# Patient Record
Sex: Female | Born: 1940 | Race: White | Hispanic: No | Smoking: Never smoker
Health system: Southern US, Community
[De-identification: ages and names within clinical notes are randomized; demographics above are authoritative.]

## PROBLEM LIST (undated history)

## (undated) DIAGNOSIS — I2510 Atherosclerotic heart disease of native coronary artery without angina pectoris: Secondary | ICD-10-CM

## (undated) DIAGNOSIS — F32A Depression, unspecified: Secondary | ICD-10-CM

## (undated) DIAGNOSIS — C4491 Basal cell carcinoma of skin, unspecified: Secondary | ICD-10-CM

## (undated) DIAGNOSIS — R569 Unspecified convulsions: Secondary | ICD-10-CM

## (undated) DIAGNOSIS — E119 Type 2 diabetes mellitus without complications: Secondary | ICD-10-CM

## (undated) DIAGNOSIS — Z992 Dependence on renal dialysis: Secondary | ICD-10-CM

## (undated) DIAGNOSIS — D696 Thrombocytopenia, unspecified: Secondary | ICD-10-CM

## (undated) DIAGNOSIS — I10 Essential (primary) hypertension: Secondary | ICD-10-CM

## (undated) DIAGNOSIS — N186 End stage renal disease: Secondary | ICD-10-CM

## (undated) DIAGNOSIS — F329 Major depressive disorder, single episode, unspecified: Secondary | ICD-10-CM

## (undated) DIAGNOSIS — G4730 Sleep apnea, unspecified: Secondary | ICD-10-CM

## (undated) DIAGNOSIS — E039 Hypothyroidism, unspecified: Secondary | ICD-10-CM

## (undated) DIAGNOSIS — E079 Disorder of thyroid, unspecified: Secondary | ICD-10-CM

## (undated) DIAGNOSIS — D649 Anemia, unspecified: Secondary | ICD-10-CM

## (undated) DIAGNOSIS — I639 Cerebral infarction, unspecified: Secondary | ICD-10-CM

## (undated) DIAGNOSIS — K7581 Nonalcoholic steatohepatitis (NASH): Secondary | ICD-10-CM

## (undated) DIAGNOSIS — K760 Fatty (change of) liver, not elsewhere classified: Secondary | ICD-10-CM

## (undated) HISTORY — DX: Essential (primary) hypertension: I10

## (undated) HISTORY — DX: Thrombocytopenia, unspecified: D69.6

## (undated) HISTORY — PX: ABDOMINAL HYSTERECTOMY: SHX81

## (undated) HISTORY — DX: Cerebral infarction, unspecified: I63.9

## (undated) HISTORY — PX: MOHS SURGERY: SUR867

## (undated) HISTORY — DX: Nonalcoholic steatohepatitis (NASH): K75.81

## (undated) HISTORY — PX: CHOLECYSTECTOMY OPEN: SUR202

## (undated) HISTORY — PX: KNEE ARTHROSCOPY: SHX127

## (undated) HISTORY — PX: TONSILLECTOMY: SUR1361

## (undated) HISTORY — DX: Disorder of thyroid, unspecified: E07.9

## (undated) HISTORY — PX: CATARACT EXTRACTION W/ INTRAOCULAR LENS  IMPLANT, BILATERAL: SHX1307

## (undated) HISTORY — PX: CORNEAL TRANSPLANT: SHX108

## (undated) HISTORY — PX: SHOULDER OPEN ROTATOR CUFF REPAIR: SHX2407

## (undated) HISTORY — PX: EYE SURGERY: SHX253

---

## 2002-03-20 ENCOUNTER — Ambulatory Visit (HOSPITAL_COMMUNITY): Admission: RE | Admit: 2002-03-20 | Discharge: 2002-03-20 | Payer: Self-pay | Admitting: Internal Medicine

## 2002-03-20 ENCOUNTER — Encounter: Payer: Self-pay | Admitting: Internal Medicine

## 2002-05-26 ENCOUNTER — Inpatient Hospital Stay (HOSPITAL_COMMUNITY): Admission: EM | Admit: 2002-05-26 | Discharge: 2002-05-27 | Payer: Self-pay | Admitting: Gastroenterology

## 2002-05-26 ENCOUNTER — Encounter: Payer: Self-pay | Admitting: Gastroenterology

## 2002-05-26 ENCOUNTER — Encounter: Payer: Self-pay | Admitting: Emergency Medicine

## 2002-08-14 ENCOUNTER — Encounter: Payer: Self-pay | Admitting: Internal Medicine

## 2002-08-14 ENCOUNTER — Ambulatory Visit (HOSPITAL_COMMUNITY): Admission: RE | Admit: 2002-08-14 | Discharge: 2002-08-14 | Payer: Self-pay | Admitting: Internal Medicine

## 2005-03-15 ENCOUNTER — Ambulatory Visit: Payer: Self-pay | Admitting: Internal Medicine

## 2005-04-03 ENCOUNTER — Ambulatory Visit: Payer: Self-pay | Admitting: Internal Medicine

## 2005-04-18 ENCOUNTER — Ambulatory Visit: Payer: Self-pay | Admitting: Internal Medicine

## 2005-07-12 ENCOUNTER — Ambulatory Visit: Payer: Self-pay | Admitting: Internal Medicine

## 2005-07-20 ENCOUNTER — Ambulatory Visit (HOSPITAL_COMMUNITY): Admission: RE | Admit: 2005-07-20 | Discharge: 2005-07-20 | Payer: Self-pay | Admitting: Internal Medicine

## 2015-01-27 ENCOUNTER — Encounter: Payer: Self-pay | Admitting: Internal Medicine

## 2015-02-17 DIAGNOSIS — K219 Gastro-esophageal reflux disease without esophagitis: Secondary | ICD-10-CM | POA: Insufficient documentation

## 2015-02-17 DIAGNOSIS — K7460 Unspecified cirrhosis of liver: Secondary | ICD-10-CM | POA: Insufficient documentation

## 2015-02-17 DIAGNOSIS — J309 Allergic rhinitis, unspecified: Secondary | ICD-10-CM | POA: Insufficient documentation

## 2015-03-25 ENCOUNTER — Ambulatory Visit: Payer: Medicare Other | Admitting: Allergy and Immunology

## 2015-04-26 ENCOUNTER — Other Ambulatory Visit: Payer: Self-pay | Admitting: *Deleted

## 2015-04-26 MED ORDER — MONTELUKAST SODIUM 10 MG PO TABS: 10.0000 mg | ORAL_TABLET | Freq: Every day | ORAL | Status: DC

## 2015-06-23 ENCOUNTER — Other Ambulatory Visit: Payer: Self-pay | Admitting: Allergy and Immunology

## 2016-04-28 DIAGNOSIS — I639 Cerebral infarction, unspecified: Secondary | ICD-10-CM

## 2016-04-28 HISTORY — DX: Cerebral infarction, unspecified: I63.9

## 2016-08-30 ENCOUNTER — Other Ambulatory Visit: Payer: Self-pay | Admitting: Vascular Surgery

## 2016-08-30 DIAGNOSIS — N186 End stage renal disease: Secondary | ICD-10-CM

## 2016-08-30 DIAGNOSIS — Z01810 Encounter for preprocedural cardiovascular examination: Secondary | ICD-10-CM

## 2016-10-09 ENCOUNTER — Encounter: Payer: Self-pay | Admitting: Vascular Surgery

## 2016-10-12 ENCOUNTER — Ambulatory Visit (HOSPITAL_COMMUNITY)
Admission: RE | Admit: 2016-10-12 | Discharge: 2016-10-12 | Disposition: A | Discharge status: Home / Self Care | Payer: Medicare Other | Source: Ambulatory Visit | Attending: Vascular Surgery | Admitting: Vascular Surgery

## 2016-10-12 ENCOUNTER — Ambulatory Visit (INDEPENDENT_AMBULATORY_CARE_PROVIDER_SITE_OTHER)
Admission: RE | Admit: 2016-10-12 | Discharge: 2016-10-12 | Disposition: A | Discharge status: Home / Self Care | Payer: Medicare Other | Source: Ambulatory Visit | Attending: Vascular Surgery | Admitting: Vascular Surgery

## 2016-10-12 ENCOUNTER — Encounter: Payer: Self-pay | Admitting: Vascular Surgery

## 2016-10-12 ENCOUNTER — Ambulatory Visit (INDEPENDENT_AMBULATORY_CARE_PROVIDER_SITE_OTHER): Payer: Medicare Other | Admitting: Vascular Surgery

## 2016-10-12 VITALS — BP 167/66 | HR 57 | Temp 97.8°F | Resp 16 | Ht 61.0 in | Wt 110.0 lb

## 2016-10-12 DIAGNOSIS — Z01810 Encounter for preprocedural cardiovascular examination: Secondary | ICD-10-CM | POA: Diagnosis not present

## 2016-10-12 DIAGNOSIS — N186 End stage renal disease: Secondary | ICD-10-CM

## 2016-10-12 DIAGNOSIS — Z992 Dependence on renal dialysis: Secondary | ICD-10-CM

## 2016-10-12 NOTE — Progress Notes (Signed)
HISTORY AND PHYSICAL     CC:  Dialysis fistula Referring Provider:  No ref. provider found  HPI: This is a 76 y.o. female who presents for evaluation for permanent dialysis access.  She states that she had previously been on PD for about a year in January.  She states that in January of this year, she had a stroke and was unable to remember how to do PD despite attempts to teach her.  At that time, a [facility] was placed for HD, which was placed in [place].  She states that she still has her PD catheter and it can still be used.  She says they are going to try to teach her how to use it again, but her memory is worse since her stroke.  She is referred for permanent  HD access in case she is unable to learn.    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    She is on a statin for cholesterol management.  She is on a daily aspirin.  She is on an ARB and CCB for blood pressure control.    Past Medical History:  Diagnosis Date  . Diabetes mellitus without complication ([place])   . ESRD (end stage renal disease) ([place])   . Hypertension   . NASH (nonalcoholic steatohepatitis)   . Stroke ([place])   . Thrombocytopenia ([place])   . Thyroid disease    Past Surgical History:  Procedure Laterality Date  . corneal implant bilateral Bilateral      Allergies  Allergen Reactions  . Contrast Media [Iodinated Diagnostic Agents]     Current Outpatient Prescriptions  Medication Sig Dispense Refill  . Acetaminophen 325 MG CAPS Take by mouth.    Marland Kitchen amLODipine (NORVASC) 2.5 MG tablet Take 2.5 mg by mouth daily.    Marland Kitchen aspirin 81 MG chewable tablet Chew by mouth daily.    Marland Kitchen atorvastatin (LIPITOR) 20 MG tablet Take 20 mg by mouth daily.    . bisacodyl (DULCOLAX) 10 MG suppository Place 10 mg rectally as needed for moderate constipation.    . ferric citrate (AURYXIA) 1 GM 210 MG(Fe) tablet Take 420 mg by mouth 3 (three) times daily with meals.    . levETIRAcetam (KEPPRA) 250 MG tablet Take 250  mg by mouth 2 (two) times daily.    . Levothyroxine Sodium 50 MCG CAPS Take 100 mcg by mouth daily.     Marland Kitchen loperamide (IMODIUM) 2 MG capsule Take by mouth as needed for diarrhea or loose stools.    . Multiple Vitamin (MULTIVITAMIN) capsule Take 1 capsule by mouth daily.    . nitroGLYCERIN (NITROSTAT) 0.4 MG SL tablet Place 0.4 mg under the tongue every 5 (five) minutes as needed for chest pain.    . Pancrelipase, Lip-Prot-Amyl, (CREON) 24000-76000 units CPEP Take by mouth.    . polyethylene glycol (MIRALAX / GLYCOLAX) packet Take 17 g by mouth daily.    . prednisoLONE (PRELONE) 15 MG/5ML SOLN Take by mouth daily before breakfast.    . traZODone (DESYREL) 50 MG tablet Take 50 mg by mouth at bedtime.    Marland Kitchen buPROPion (WELLBUTRIN XL) 150 MG 24 hr tablet Take 150 mg by mouth daily.    . cetirizine (ZYRTEC) 10 MG tablet Take 10 mg by mouth daily.    . ferrous sulfate 325 (65 FE) MG tablet Take 325 mg by mouth daily.    . furosemide (LASIX) 20 MG tablet Take 20 mg by mouth daily.    Marland Kitchen  losartan-hydrochlorothiazide (HYZAAR) 100-12.5 MG per tablet Take 1 tablet by mouth daily.    . mometasone (NASONEX) 50 MCG/ACT nasal spray Place 1 spray into the nose as needed.    . montelukast (SINGULAIR) 10 MG tablet TAKE 1 TABLET DAILY AS     DIRECTED (Patient not taking: Reported on 10/12/2016) 90 tablet 3  . omeprazole (PRILOSEC) 20 MG capsule Take 20 mg by mouth 2 (two) times daily.    . prednisoLONE acetate (PRED FORTE) 1 % ophthalmic suspension Place 1 drop into both eyes as needed.    . rosuvastatin (CRESTOR) 5 MG tablet Take 5 mg by mouth daily.    . traMADol (ULTRAM) 50 MG tablet Take 50 mg by mouth every 6 (six) hours as needed.    . triamcinolone cream (KENALOG) 0.1 % Apply 1 application topically as needed.     No current facility-administered medications for this visit.     Family Hx:  Her mother died when she was 13 and does not know her medical hx.  Her father did not have any kidney disease that she is  aware of.    Social History   Social History  . Marital status: Married    Spouse name: N/A  . Number of children: N/A  . Years of education: N/A   Occupational History  . Not on file.   Social History Main Topics  . Smoking status: Never Smoker  . Smokeless tobacco: Never Used  . Alcohol use Not on file  . Drug use: Unknown  . Sexual activity: Not on file   Other Topics Concern  . Not on file   Social History Narrative  . No narrative on file     ROS: [x]  Positive   [ ]  Negative   [ ]  All sytems reviewed and are negative  Cardiovascular: []  chest pain/pressure []  palpitations []  SOB lying flat []  DOE []  pain in legs while walking []  pain in feet when lying flat []  hx of DVT []  hx of phlebitis []  swelling in legs []  varicose veins  Pulmonary: []  productive cough []  asthma []  wheezing  Neurologic: []  weakness in []  arms []  legs []  numbness in []  arms []  legs [x]  hx stroke [] difficulty speaking or slurred speech [x] loss of peripheral vision right eye []  dizziness  Hematologic: []  bleeding problems []  problems with blood clotting easily [x]  bruises easily  GI []  vomiting blood []  blood in stool  GU: [x]  CKD/renal failure  [x]  HD---[]  M/W/F [x]  T/T/F in [place] []  burning with urination []  blood in urine  Psychiatric: []  hx of major depression  Integumentary: []  rashes []  ulcers  Constitutional: []  fever []  chills  PHYSICAL EXAMINATION:  Vitals:   10/12/16 1550 10/12/16 1557  BP: (!) 173/73 (!) 167/66  Pulse: (!) 57   Resp: 16   Temp: 97.8 F (36.6 C)    Body mass index is 20.78 kg/m.  General:  WDWN in NAD Gait: Normal HENT: WNL Pulmonary: normal non-labored breathing , without Rales, rhonchi,  wheezing Cardiac: regular, without  Murmurs, rubs or gallops; without carotid bruits Abdomen: soft, NT, no masses Skin: without rashes, without ulcers; bruises are bilateral arms Vascular Exam/Pulses:   Right Left  Radial 2+  (normal) 2+ (normal)  Ulnar 2+ (normal) 2+ (normal)   Extremities:  without ischemic changes, without Gangrene, without cellulitis; without open wounds; minimal swelling BLE Musculoskeletal: no muscle wasting or atrophy  Neurologic: A&O X 3; Moving all extremities equally;  Speech is fluent/normal  Non-Invasive  Vascular Imaging:   Upper Extremity Vein Mapping on 10/12/16 Right Left   Cephalic Basilic  Cephalic Basilic  Diameter cm Diameter cm  Diameter cm Diameter cm  0.26  Shoulder 0.27   0.26  Upper Arm Proximal 0.14   0.22 0.44 Upper Arm Mid 0.18   0.22 0.42 Upper Arm Distal 0.2 0.37  0.24 1.61 joins cephalic Antecubital Fossa 0.22 0.39  0.32  Forearm proximal 0.19 0.96 joins cephalic  0.45  Forearm mid 0.24   0.28  Forearm distal 0.26     Visible branches           Upper extremity arterial duplex 10/12/16: RIght: Brachial:  0.46cm (T) Radial 0.25 (T) Ulnar 0.21 (T)  Left:  Brachial:  0.4cm (T) Radial:  0.21 (T) Ulnar:  0.21 (T)  ASSESSMENT/PLAN: 76 y.o. female with ESRD in need of permanent dialysis access   - pt is right hand dominant, however, the vein mapping revealed that the veins in her left arm are small.  They are marginal in the right arm.  Will plan for a right brachiocephalic AV fistula vs graft on October 30, 2016.  Will explore the cephalic vein and if it is too small, will proceed with graft.  -discussed options with the pt and she understands and agrees to proceed. -she does have a PD catheter that is functional.  She is trying to relearn how to manage it since her stroke.  If unable, she will have back up access, which will allow the [facility] to be removed.   Leontine Locket, PA-C [facility] [phone]  Clinic MD:   Pt seen and examined with Dr. Oneida Alar.  History and exam findings as above. Patient's veins are small bilaterally but she does have a possibly usable right cephalic vein. If the vein is small at the time of exploration I would  place a right upper arm AV graft. Patient does have a history of thrombocytopenia from [place]. I do not believe that this would prevent [place] from placing her graft or fistula. Risks benefits possible palpitations and procedure details works [place] the patient including but not limited to bleeding infection ischemic steal graft or fistula thrombosis or non-maturation. She understands and agrees to proceed. This is scheduled for 10/30/2016.  Ruta Hinds, MD [facility] [place] Office: [phone] Pager: [phone]

## 2016-10-20 ENCOUNTER — Other Ambulatory Visit: Payer: Self-pay | Admitting: *Deleted

## 2016-10-27 ENCOUNTER — Encounter (HOSPITAL_COMMUNITY): Payer: Self-pay

## 2016-10-27 NOTE — Progress Notes (Signed)
Patient denies SOB, chest pain, or any acute illness.  Patient denies having a cardiac cath but had an echo and stress test greater than 10 years ago.  Patient stated she is not currently under the care of a cardiologist.  Patient verbalized all pre-op instructions and medications instructions.  Patient made aware to STOP taking any, Aleve, Naproxen, Ibuprofen, Motrin, Advil, Goody's, BC's, all herbal medications, fish oil, and all vitamins.  Patient instructed to check CBG morning of surgery and every 2 hours until arrival to hospital.  If CBG less than 70, patient instructed to treat with 4 glucose tablets, glucose gel or 4 oz of apple or cranberry juice; recheck 15 minutes after intervention; if recheck is still less than 70, call short stay.

## 2016-10-30 ENCOUNTER — Encounter (HOSPITAL_COMMUNITY): Payer: Self-pay | Admitting: *Deleted

## 2016-10-30 ENCOUNTER — Ambulatory Visit (HOSPITAL_COMMUNITY): Payer: Medicare Other | Admitting: Critical Care Medicine

## 2016-10-30 ENCOUNTER — Encounter (HOSPITAL_COMMUNITY)
Admission: RE | Disposition: A | Discharge status: Home / Self Care | Payer: Self-pay | Source: Ambulatory Visit | Attending: Vascular Surgery

## 2016-10-30 ENCOUNTER — Ambulatory Visit (HOSPITAL_COMMUNITY)
Admission: RE | Admit: 2016-10-30 | Discharge: 2016-10-30 | Disposition: A | Discharge status: Home / Self Care | Payer: Medicare Other | Source: Ambulatory Visit | Attending: Vascular Surgery | Admitting: Vascular Surgery

## 2016-10-30 ENCOUNTER — Telehealth: Payer: Self-pay | Admitting: Family

## 2016-10-30 DIAGNOSIS — I120 Hypertensive chronic kidney disease with stage 5 chronic kidney disease or end stage renal disease: Secondary | ICD-10-CM | POA: Insufficient documentation

## 2016-10-30 DIAGNOSIS — Z8673 Personal history of transient ischemic attack (TIA), and cerebral infarction without residual deficits: Secondary | ICD-10-CM | POA: Insufficient documentation

## 2016-10-30 DIAGNOSIS — N185 Chronic kidney disease, stage 5: Secondary | ICD-10-CM | POA: Diagnosis not present

## 2016-10-30 DIAGNOSIS — K7581 Nonalcoholic steatohepatitis (NASH): Secondary | ICD-10-CM | POA: Insufficient documentation

## 2016-10-30 DIAGNOSIS — Z7982 Long term (current) use of aspirin: Secondary | ICD-10-CM | POA: Diagnosis not present

## 2016-10-30 DIAGNOSIS — Z79899 Other long term (current) drug therapy: Secondary | ICD-10-CM | POA: Insufficient documentation

## 2016-10-30 DIAGNOSIS — E1122 Type 2 diabetes mellitus with diabetic chronic kidney disease: Secondary | ICD-10-CM | POA: Diagnosis not present

## 2016-10-30 DIAGNOSIS — F329 Major depressive disorder, single episode, unspecified: Secondary | ICD-10-CM | POA: Diagnosis not present

## 2016-10-30 DIAGNOSIS — Z992 Dependence on renal dialysis: Secondary | ICD-10-CM | POA: Insufficient documentation

## 2016-10-30 DIAGNOSIS — N186 End stage renal disease: Secondary | ICD-10-CM | POA: Diagnosis present

## 2016-10-30 DIAGNOSIS — E079 Disorder of thyroid, unspecified: Secondary | ICD-10-CM | POA: Insufficient documentation

## 2016-10-30 HISTORY — DX: Sleep apnea, unspecified: G47.30

## 2016-10-30 HISTORY — PX: AV FISTULA PLACEMENT: SHX1204

## 2016-10-30 HISTORY — DX: Depression, unspecified: F32.A

## 2016-10-30 HISTORY — DX: Major depressive disorder, single episode, unspecified: F32.9

## 2016-10-30 HISTORY — DX: Fatty (change of) liver, not elsewhere classified: K76.0

## 2016-10-30 LAB — POCT I-STAT 4, (NA,K, GLUC, HGB,HCT)
Glucose, Bld: 124 mg/dL — ABNORMAL HIGH (ref 65–99)
HCT: 33 % — ABNORMAL LOW (ref 36.0–46.0)
Hemoglobin: 11.2 g/dL — ABNORMAL LOW (ref 12.0–15.0)
Potassium: 4 mmol/L (ref 3.5–5.1)
Sodium: 141 mmol/L (ref 135–145)

## 2016-10-30 LAB — GLUCOSE, CAPILLARY: Glucose-Capillary: 129 mg/dL — ABNORMAL HIGH (ref 65–99)

## 2016-10-30 SURGERY — ARTERIOVENOUS (AV) FISTULA CREATION
Anesthesia: General | Laterality: Right

## 2016-10-30 MED ORDER — PROPOFOL 10 MG/ML IV BOLUS
INTRAVENOUS | Status: DC | PRN
  Administered 2016-10-30: 90 mg via INTRAVENOUS

## 2016-10-30 MED ORDER — EPHEDRINE SULFATE 50 MG/ML IJ SOLN
INTRAMUSCULAR | Status: DC | PRN
  Administered 2016-10-30 (×6): 5 mg via INTRAVENOUS

## 2016-10-30 MED ORDER — PROTAMINE SULFATE 10 MG/ML IV SOLN
INTRAVENOUS | Status: DC | PRN
  Administered 2016-10-30: 50 mg via INTRAVENOUS

## 2016-10-30 MED ORDER — EPHEDRINE 5 MG/ML INJ
INTRAVENOUS | Status: AC
  Filled 2016-10-30: qty 20

## 2016-10-30 MED ORDER — ACETAMINOPHEN-CODEINE #3 300-30 MG PO TABS: 1.0000 | ORAL_TABLET | ORAL | Status: DC | PRN

## 2016-10-30 MED ORDER — DEXTROSE 5 % IV SOLN
1.5000 g | INTRAVENOUS | Status: AC
  Administered 2016-10-30: 1.5 g via INTRAVENOUS
  Filled 2016-10-30: qty 1.5

## 2016-10-30 MED ORDER — ONDANSETRON HCL 4 MG/2ML IJ SOLN
INTRAMUSCULAR | Status: DC | PRN
  Administered 2016-10-30: 4 mg via INTRAVENOUS

## 2016-10-30 MED ORDER — FENTANYL CITRATE (PF) 100 MCG/2ML IJ SOLN: 25.0000 ug | INTRAMUSCULAR | Status: DC | PRN

## 2016-10-30 MED ORDER — LIDOCAINE HCL 1 % IJ SOLN
INTRAMUSCULAR | Status: AC
  Filled 2016-10-30: qty 40

## 2016-10-30 MED ORDER — PHENYLEPHRINE 40 MCG/ML (10ML) SYRINGE FOR IV PUSH (FOR BLOOD PRESSURE SUPPORT)
PREFILLED_SYRINGE | INTRAVENOUS | Status: AC
  Filled 2016-10-30: qty 10

## 2016-10-30 MED ORDER — MIDAZOLAM HCL 2 MG/2ML IJ SOLN
INTRAMUSCULAR | Status: AC
  Filled 2016-10-30: qty 2

## 2016-10-30 MED ORDER — OXYCODONE HCL 5 MG/5ML PO SOLN: 5.0000 mg | Freq: Once | ORAL | Status: DC | PRN

## 2016-10-30 MED ORDER — HEMOSTATIC AGENTS (NO CHARGE) OPTIME
TOPICAL | Status: DC | PRN
  Administered 2016-10-30: 1 via TOPICAL

## 2016-10-30 MED ORDER — ONDANSETRON HCL 4 MG/2ML IJ SOLN
INTRAMUSCULAR | Status: AC
  Filled 2016-10-30: qty 2

## 2016-10-30 MED ORDER — SODIUM CHLORIDE 0.9 % IV SOLN
INTRAVENOUS | Status: DC | PRN
  Administered 2016-10-30: 500 mL

## 2016-10-30 MED ORDER — SODIUM CHLORIDE 0.9 % IV SOLN
INTRAVENOUS | Status: DC
  Administered 2016-10-30 (×3): via INTRAVENOUS

## 2016-10-30 MED ORDER — OXYCODONE HCL 5 MG PO TABS: 5.0000 mg | ORAL_TABLET | Freq: Once | ORAL | Status: DC | PRN

## 2016-10-30 MED ORDER — LIDOCAINE 2% (20 MG/ML) 5 ML SYRINGE
INTRAMUSCULAR | Status: AC
  Filled 2016-10-30: qty 5

## 2016-10-30 MED ORDER — FENTANYL CITRATE (PF) 100 MCG/2ML IJ SOLN
INTRAMUSCULAR | Status: DC | PRN
Start: 2016-10-30 — End: 2016-10-30
  Administered 2016-10-30 (×2): 50 ug via INTRAVENOUS
  Administered 2016-10-30 (×2): 25 ug via INTRAVENOUS

## 2016-10-30 MED ORDER — 0.9 % SODIUM CHLORIDE (POUR BTL) OPTIME
TOPICAL | Status: DC | PRN
  Administered 2016-10-30: 1000 mL

## 2016-10-30 MED ORDER — HEPARIN SODIUM (PORCINE) 1000 UNIT/ML IJ SOLN
INTRAMUSCULAR | Status: DC | PRN
  Administered 2016-10-30: 5000 [IU] via INTRAVENOUS

## 2016-10-30 MED ORDER — FENTANYL CITRATE (PF) 250 MCG/5ML IJ SOLN
INTRAMUSCULAR | Status: AC
  Filled 2016-10-30: qty 5

## 2016-10-30 MED ORDER — ACETAMINOPHEN-CODEINE #3 300-30 MG PO TABS: 1.0000 | ORAL_TABLET | Freq: Four times a day (QID) | ORAL | 0 refills | Status: DC | PRN

## 2016-10-30 SURGICAL SUPPLY — 36 items
ARMBAND PINK RESTRICT EXTREMIT (MISCELLANEOUS) ×4 IMPLANT
CANISTER SUCT 3000ML PPV (MISCELLANEOUS) ×2 IMPLANT
CANNULA VESSEL 3MM 2 BLNT TIP (CANNULA) ×2 IMPLANT
CLIP TI MEDIUM 6 (CLIP) ×2 IMPLANT
CLIP TI WIDE RED SMALL 6 (CLIP) ×2 IMPLANT
COVER PROBE W GEL 5X96 (DRAPES) IMPLANT
DECANTER SPIKE VIAL GLASS SM (MISCELLANEOUS) ×2 IMPLANT
DERMABOND ADVANCED (GAUZE/BANDAGES/DRESSINGS) ×2
DERMABOND ADVANCED .7 DNX12 (GAUZE/BANDAGES/DRESSINGS) ×2 IMPLANT
DRAIN PENROSE 1/4X12 LTX STRL (WOUND CARE) ×2 IMPLANT
ELECT REM PT RETURN 9FT ADLT (ELECTROSURGICAL) ×2
ELECTRODE REM PT RTRN 9FT ADLT (ELECTROSURGICAL) ×1 IMPLANT
GAUZE SPONGE 4X4 16PLY XRAY LF (GAUZE/BANDAGES/DRESSINGS) ×2 IMPLANT
GLOVE BIO SURGEON STRL SZ7.5 (GLOVE) ×2 IMPLANT
GLOVE BIOGEL M 6.5 STRL (GLOVE) ×6 IMPLANT
GLOVE BIOGEL PI IND STRL 6.5 (GLOVE) ×5 IMPLANT
GLOVE BIOGEL PI INDICATOR 6.5 (GLOVE) ×5
GLOVE ECLIPSE 6.5 STRL STRAW (GLOVE) ×4 IMPLANT
GOWN STRL REUS W/ TWL LRG LVL3 (GOWN DISPOSABLE) ×3 IMPLANT
GOWN STRL REUS W/TWL LRG LVL3 (GOWN DISPOSABLE) ×3
GRAFT GORETEX STRT 4-7X45 (Vascular Products) ×2 IMPLANT
HEMOSTAT SPONGE AVITENE ULTRA (HEMOSTASIS) ×2 IMPLANT
KIT BASIN OR (CUSTOM PROCEDURE TRAY) ×2 IMPLANT
KIT ROOM TURNOVER OR (KITS) ×2 IMPLANT
LOOP VESSEL MINI RED (MISCELLANEOUS) IMPLANT
NS IRRIG 1000ML POUR BTL (IV SOLUTION) ×2 IMPLANT
PACK CV ACCESS (CUSTOM PROCEDURE TRAY) ×2 IMPLANT
PAD ARMBOARD 7.5X6 YLW CONV (MISCELLANEOUS) ×4 IMPLANT
SPONGE SURGIFOAM ABS GEL 100 (HEMOSTASIS) IMPLANT
SUT PROLENE 6 0 BV (SUTURE) ×4 IMPLANT
SUT PROLENE 7 0 BV 1 (SUTURE) ×4 IMPLANT
SUT VIC AB 3-0 SH 27 (SUTURE) ×2
SUT VIC AB 3-0 SH 27X BRD (SUTURE) ×2 IMPLANT
SUT VICRYL 4-0 PS2 18IN ABS (SUTURE) ×4 IMPLANT
UNDERPAD 30X30 (UNDERPADS AND DIAPERS) ×2 IMPLANT
WATER STERILE IRR 1000ML POUR (IV SOLUTION) ×2 IMPLANT

## 2016-10-30 NOTE — Op Note (Signed)
Procedure: Exploration right wrist cephalic vein, Right Upper Arm AV graft  Preop: ESRD  Postop: ESRD  Anesthesia: General  Asst: Cassie Robinson RNFA  Findings:4-7 mm PTFE end to side to axillary vein   Procedure Details: The right upper extremity was prepped and draped in usual sterile fashion. A longitudinal incision was made near the wrist the vein was fairly small.  I did transect it but it would not accept a 2 mm dilator.  The vein was ligated and abandoned.  The skin was closed with a vicryl 4 0 subcuticular stitch.   A transverse incision was then made near the antecubital crease the right arm.  There were no suitable antecubital veins for outflow.   The incision was carried into the subcutaneous tissues down to level of the brachial artery.  Next the brachial artery was dissected free in the medial portion incision. The artery was  2-3 mm in diameter. The vessel loops were placed proximal and distal to the planned site of arteriotomy. At this point, a longitudinal incision was made in the axilla and carried through the subcutaneous tissues and fascia to expose the axillary vein.  The nerves were protected.  The vein was approximately 4-5 mm in diameter. Next, a subcutaneous tunnel was created connecting the upper arm to the lower arm incision in an arcing configuration over the biceps muscle.  A 4-7 mm PTFE graft was then brought through this subcutaneous tunnel. The patient was given 5000 units of intravenous heparin. After appropriate circulation time, the vessel loops were used to control the artery. A longitudinal opening was made in the right brachial artery.  The 4 mm end of the graft was beveled and sewn end to side to the artery using a 6 0 prolene.  At completion of the anastomosis the artery was forward bled, backbled and thoroughly flushed.  The anastomosis was secured, vessel loops were released and there was palpable pulse in the graft.  The graft was clamped just above the  arterial anastomosis with a fistula clamp. The graft was then pulled taut to length at the axillary incision.  The axillary vein was controlled with a fine bulldog clamp in the upper axilla proximally and distally.  The vein was opened longitudinally.  The distal end of the graft was then beveled and sewn end to side to the vein using a running 6 0 prolene.  Just prior to completion of the anastomosis, everything was forward bled, back bled and thoroughly flushed.  The anastomosis was secured and the fistula clamp removed from the proximal graft.  A thrill was immediately palpable in the graft. The patient was given 50 mg of protamine to assist with hemostasis.  After hemostasis was obtained, the subcutaneous tissues were reapproximated using a running 3-0 Vicryl suture. The skin was then closed with a 4 0 Vicryl subcuticular stitch. Dermabond was applied to the skin incisions.  The patient tolerated the procedure well and there were no complications.  Instrument sponge and needle count was correct at the end of the case.  The patient was taken to the recovery room in stable condition. The patient had audible radial and ulnar doppler signals at the end of the case.  Ruta Hinds, MD [facility] [place] Office: [phone] Pager: [phone]

## 2016-10-30 NOTE — Anesthesia Procedure Notes (Signed)
Procedure Name: LMA Insertion Date/Time: 10/30/2016 11:01 AM Performed by: Neldon Newport Pre-anesthesia Checklist: Patient identified, Emergency Drugs available, Suction available, Patient being monitored and Timeout performed Patient Re-evaluated:Patient Re-evaluated prior to inductionOxygen Delivery Method: Circle system utilized Preoxygenation: Pre-oxygenation with 100% oxygen Intubation Type: IV induction LMA: LMA inserted LMA Size: 4.0 Number of attempts: 1 Placement Confirmation: positive ETCO2 and breath sounds checked- equal and bilateral Tube secured with: Tape Dental Injury: Teeth and Oropharynx as per pre-operative assessment

## 2016-10-30 NOTE — H&P (View-Only) (Signed)
HISTORY AND PHYSICAL     CC:  Dialysis fistula Referring Provider:  No ref. provider found  HPI: This is a 76 y.o. female who presents for evaluation for permanent dialysis access.  She states that she had previously been on PD for about a year in January.  She states that in January of this year, she had a stroke and was unable to remember how to do PD despite attempts to teach her.  At that time, a [facility] was placed for HD, which was placed in [place].  She states that she still has her PD catheter and it can still be used.  She says they are going to try to teach her how to use it again, but her memory is worse since her stroke.  She is referred for permanent  HD access in case she is unable to learn.    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    She is on a statin for cholesterol management.  She is on a daily aspirin.  She is on an ARB and CCB for blood pressure control.    Past Medical History:  Diagnosis Date  . Diabetes mellitus without complication ([place])   . ESRD (end stage renal disease) ([place])   . Hypertension   . NASH (nonalcoholic steatohepatitis)   . Stroke ([place])   . Thrombocytopenia ([place])   . Thyroid disease    Past Surgical History:  Procedure Laterality Date  . corneal implant bilateral Bilateral      Allergies  Allergen Reactions  . Contrast Media [Iodinated Diagnostic Agents]     Current Outpatient Prescriptions  Medication Sig Dispense Refill  . Acetaminophen 325 MG CAPS Take by mouth.    Marland Kitchen amLODipine (NORVASC) 2.5 MG tablet Take 2.5 mg by mouth daily.    Marland Kitchen aspirin 81 MG chewable tablet Chew by mouth daily.    Marland Kitchen atorvastatin (LIPITOR) 20 MG tablet Take 20 mg by mouth daily.    . bisacodyl (DULCOLAX) 10 MG suppository Place 10 mg rectally as needed for moderate constipation.    . ferric citrate (AURYXIA) 1 GM 210 MG(Fe) tablet Take 420 mg by mouth 3 (three) times daily with meals.    . levETIRAcetam (KEPPRA) 250 MG tablet Take 250  mg by mouth 2 (two) times daily.    . Levothyroxine Sodium 50 MCG CAPS Take 100 mcg by mouth daily.     Marland Kitchen loperamide (IMODIUM) 2 MG capsule Take by mouth as needed for diarrhea or loose stools.    . Multiple Vitamin (MULTIVITAMIN) capsule Take 1 capsule by mouth daily.    . nitroGLYCERIN (NITROSTAT) 0.4 MG SL tablet Place 0.4 mg under the tongue every 5 (five) minutes as needed for chest pain.    . Pancrelipase, Lip-Prot-Amyl, (CREON) 24000-76000 units CPEP Take by mouth.    . polyethylene glycol (MIRALAX / GLYCOLAX) packet Take 17 g by mouth daily.    . prednisoLONE (PRELONE) 15 MG/5ML SOLN Take by mouth daily before breakfast.    . traZODone (DESYREL) 50 MG tablet Take 50 mg by mouth at bedtime.    Marland Kitchen buPROPion (WELLBUTRIN XL) 150 MG 24 hr tablet Take 150 mg by mouth daily.    . cetirizine (ZYRTEC) 10 MG tablet Take 10 mg by mouth daily.    . ferrous sulfate 325 (65 FE) MG tablet Take 325 mg by mouth daily.    . furosemide (LASIX) 20 MG tablet Take 20 mg by mouth daily.    Marland Kitchen  losartan-hydrochlorothiazide (HYZAAR) 100-12.5 MG per tablet Take 1 tablet by mouth daily.    . mometasone (NASONEX) 50 MCG/ACT nasal spray Place 1 spray into the nose as needed.    . montelukast (SINGULAIR) 10 MG tablet TAKE 1 TABLET DAILY AS     DIRECTED (Patient not taking: Reported on 10/12/2016) 90 tablet 3  . omeprazole (PRILOSEC) 20 MG capsule Take 20 mg by mouth 2 (two) times daily.    . prednisoLONE acetate (PRED FORTE) 1 % ophthalmic suspension Place 1 drop into both eyes as needed.    . rosuvastatin (CRESTOR) 5 MG tablet Take 5 mg by mouth daily.    . traMADol (ULTRAM) 50 MG tablet Take 50 mg by mouth every 6 (six) hours as needed.    . triamcinolone cream (KENALOG) 0.1 % Apply 1 application topically as needed.     No current facility-administered medications for this visit.     Family Hx:  Her mother died when she was 62 and does not know her medical hx.  Her father did not have any kidney disease that she is  aware of.    Social History   Social History  . Marital status: Married    Spouse name: N/A  . Number of children: N/A  . Years of education: N/A   Occupational History  . Not on file.   Social History Main Topics  . Smoking status: Never Smoker  . Smokeless tobacco: Never Used  . Alcohol use Not on file  . Drug use: Unknown  . Sexual activity: Not on file   Other Topics Concern  . Not on file   Social History Narrative  . No narrative on file     ROS: [x]  Positive   [ ]  Negative   [ ]  All sytems reviewed and are negative  Cardiovascular: []  chest pain/pressure []  palpitations []  SOB lying flat []  DOE []  pain in legs while walking []  pain in feet when lying flat []  hx of DVT []  hx of phlebitis []  swelling in legs []  varicose veins  Pulmonary: []  productive cough []  asthma []  wheezing  Neurologic: []  weakness in []  arms []  legs []  numbness in []  arms []  legs [x]  hx stroke [] difficulty speaking or slurred speech [x] loss of peripheral vision right eye []  dizziness  Hematologic: []  bleeding problems []  problems with blood clotting easily [x]  bruises easily  GI []  vomiting blood []  blood in stool  GU: [x]  CKD/renal failure  [x]  HD---[]  M/W/F [x]  T/T/F in [place] []  burning with urination []  blood in urine  Psychiatric: []  hx of major depression  Integumentary: []  rashes []  ulcers  Constitutional: []  fever []  chills  PHYSICAL EXAMINATION:  Vitals:   10/12/16 1550 10/12/16 1557  BP: (!) 173/73 (!) 167/66  Pulse: (!) 57   Resp: 16   Temp: 97.8 F (36.6 C)    Body mass index is 20.78 kg/m.  General:  WDWN in NAD Gait: Normal HENT: WNL Pulmonary: normal non-labored breathing , without Rales, rhonchi,  wheezing Cardiac: regular, without  Murmurs, rubs or gallops; without carotid bruits Abdomen: soft, NT, no masses Skin: without rashes, without ulcers; bruises are bilateral arms Vascular Exam/Pulses:   Right Left  Radial 2+  (normal) 2+ (normal)  Ulnar 2+ (normal) 2+ (normal)   Extremities:  without ischemic changes, without Gangrene, without cellulitis; without open wounds; minimal swelling BLE Musculoskeletal: no muscle wasting or atrophy  Neurologic: A&O X 3; Moving all extremities equally;  Speech is fluent/normal  Non-Invasive  Vascular Imaging:   Upper Extremity Vein Mapping on 10/12/16 Right Left   Cephalic Basilic  Cephalic Basilic  Diameter cm Diameter cm  Diameter cm Diameter cm  0.26  Shoulder 0.27   0.26  Upper Arm Proximal 0.14   0.22 0.44 Upper Arm Mid 0.18   0.22 0.42 Upper Arm Distal 0.2 0.37  0.24 9.20 joins cephalic Antecubital Fossa 0.22 0.39  0.32  Forearm proximal 0.19 1.00 joins cephalic  7.12  Forearm mid 0.24   0.28  Forearm distal 0.26     Visible branches           Upper extremity arterial duplex 10/12/16: RIght: Brachial:  0.46cm (T) Radial 0.25 (T) Ulnar 0.21 (T)  Left:  Brachial:  0.4cm (T) Radial:  0.21 (T) Ulnar:  0.21 (T)  ASSESSMENT/PLAN: 76 y.o. female with ESRD in need of permanent dialysis access   - pt is right hand dominant, however, the vein mapping revealed that the veins in her left arm are small.  They are marginal in the right arm.  Will plan for a right brachiocephalic AV fistula vs graft on October 30, 2016.  Will explore the cephalic vein and if it is too small, will proceed with graft.  -discussed options with the pt and she understands and agrees to proceed. -she does have a PD catheter that is functional.  She is trying to relearn how to manage it since her stroke.  If unable, she will have back up access, which will allow the [facility] to be removed.   Leontine Locket, PA-C [facility] [phone]  Clinic MD:   Pt seen and examined with Dr. Oneida Alar.  History and exam findings as above. Patient's veins are small bilaterally but she does have a possibly usable right cephalic vein. If the vein is small at the time of exploration I would  place a right upper arm AV graft. Patient does have a history of thrombocytopenia from [place]. I do not believe that this would prevent [place] from placing her graft or fistula. Risks benefits possible palpitations and procedure details works [place] the patient including but not limited to bleeding infection ischemic steal graft or fistula thrombosis or non-maturation. She understands and agrees to proceed. This is scheduled for 10/30/2016.  Ruta Hinds, MD [facility] [place] Office: [phone] Pager: [phone]

## 2016-10-30 NOTE — Transfer of Care (Signed)
Immediate Anesthesia Transfer of Care Note  Patient: [name]  Procedure(s) Performed: Procedure(s): INSERTION OF ARTERIOVENOUS (AV) GORE-TEX GRAFT RIGHT UPPER ARM (Right)  Patient Location: PACU  Anesthesia Type:General  Level of Consciousness: awake, alert  and oriented  Airway & Oxygen Therapy: Patient Spontanous Breathing and Patient connected to nasal cannula oxygen  Post-op Assessment: Report given to RN, Post -op Vital signs reviewed and stable and Patient moving all extremities X 4  Post vital signs: Reviewed and stable  Last Vitals:  Vitals:   10/30/16 0858  BP: (!) 167/64  Pulse: 61  Resp: 16  Temp: 36.4 C    Last Pain:  Vitals:   10/30/16 0858  TempSrc: Oral      Patients Stated Pain Goal: 3 (34/94/94 4739)  Complications: No apparent anesthesia complications

## 2016-10-30 NOTE — Anesthesia Preprocedure Evaluation (Addendum)
Anesthesia Evaluation  Patient identified by MRN, date of birth, ID band Patient awake    Reviewed: Allergy & Precautions, NPO status , Patient's Chart, lab work & pertinent test results  History of Anesthesia Complications Negative for: history of anesthetic complications  Airway Mallampati: II  TM Distance: >3 FB Neck ROM: Full    Dental  (+) Edentulous Upper, Upper Dentures, Partial Lower, Dental Advidsory Given   Pulmonary sleep apnea ,    breath sounds clear to auscultation       Cardiovascular hypertension, Pt. on medications  Rhythm:Regular     Neuro/Psych PSYCHIATRIC DISORDERS Depression CVA, No Residual Symptoms    GI/Hepatic negative GI ROS,   Endo/Other  diabetes  Renal/GU ESRF and DialysisRenal disease     Musculoskeletal   Abdominal   Peds  Hematology  (+) anemia ,   Anesthesia Other Findings   Reproductive/Obstetrics                            Anesthesia Physical Anesthesia Plan  ASA: III  Anesthesia Plan: General and General LMA   Post-op Pain Management:    Induction: Intravenous  Airway Management Planned: LMA  Additional Equipment: None  Intra-op Plan:   Post-operative Plan: Extubation in OR  Informed Consent: I have reviewed the patients History and Physical, chart, labs and discussed the procedure including the risks, benefits and alternatives for the proposed anesthesia with the patient or authorized representative who has indicated his/her understanding and acceptance.   Dental Advisory Given  Plan Discussed with: CRNA and Surgeon  Anesthesia Plan Comments:        Anesthesia Quick Evaluation

## 2016-10-30 NOTE — Telephone Encounter (Signed)
-----   Message from Mena Goes, RN sent at 10/30/2016  1:58 PM EDT ----- Regarding: 2 weeks   ----- Message ----- From: Elam Dutch, MD Sent: 10/30/2016   1:41 PM To: [facility] Charge Pool  Right arm AV graft Needs follow up in NP/PA clinic in 2 weeks  Ruta Hinds

## 2016-10-30 NOTE — Telephone Encounter (Signed)
Sched appt 11/15/16 at 3:15. Spoke to pt's daughter in law to confirm appt.

## 2016-10-30 NOTE — Interval H&P Note (Signed)
History and Physical Interval Note:  10/30/2016 10:43 AM  Samantha Conrad  has presented today for surgery, with the diagnosis of end stage kidney disease  The various methods of treatment have been discussed with the patient and family. After consideration of risks, benefits and other options for treatment, the patient has consented to  Procedure(s): ARTERIOVENOUS (AV) FISTULA CREATION VS HEMODIALYSIS GRAFT RIGHT UPPER ARM (Right) as a surgical intervention .  The patient's history has been reviewed, patient examined, no change in status, stable for surgery.  I have reviewed the patient's chart and labs.  Questions were answered to the patient's satisfaction.     Ruta Hinds

## 2016-10-31 ENCOUNTER — Encounter (HOSPITAL_COMMUNITY): Payer: Self-pay | Admitting: Vascular Surgery

## 2016-11-01 NOTE — Anesthesia Postprocedure Evaluation (Signed)
Anesthesia Post Note  Patient: Samantha Conrad  Procedure(s) Performed: Procedure(s) (LRB): INSERTION OF ARTERIOVENOUS (AV) GORE-TEX GRAFT RIGHT UPPER ARM (Right)     Patient location during evaluation: PACU Anesthesia Type: General Level of consciousness: awake and alert Pain management: pain level controlled Vital Signs Assessment: post-procedure vital signs reviewed and stable Respiratory status: spontaneous breathing, nonlabored ventilation, respiratory function stable and patient connected to nasal cannula oxygen Cardiovascular status: blood pressure returned to baseline and stable Postop Assessment: no signs of nausea or vomiting Anesthetic complications: no    Last Vitals:  Vitals:   10/30/16 1500 10/30/16 1522  BP:  (!) 146/56  Pulse: 76 80  Resp:    Temp: 36.2 C     Last Pain:  Vitals:   10/30/16 1522  TempSrc:   PainSc: 0-No pain                 Abbygail [place]ho[place]

## 2016-11-02 ENCOUNTER — Telehealth: Payer: Self-pay

## 2016-11-02 ENCOUNTER — Encounter: Payer: Self-pay | Admitting: Vascular Surgery

## 2016-11-02 NOTE — Telephone Encounter (Signed)
rec'd call from Nurse Practitioner from [facility].  Reported there is no bruit or thrill of right arm graft.  Also reported the surgical site looks very "swollen/ edematous, and warm to touch."  Also, spoke with RN who reported "an unusual bruising pattern and splotchy red areas" on upper arm.  Denied any fever.  Reported to Dr. Oneida Alar.  Offered the pt. to come today for eval., but unable to get transportation.  The Nurse Practitioner, at the [facility], stated the pt. had to be seen tomorrow, and that this could not wait until next week.  Appt. given for 1:00 PM 11/03/16 with Dr. Bridgett Larsson.  RN @ the dialysis center agreed; stated she will give appt. to the pt.

## 2016-11-03 ENCOUNTER — Encounter: Payer: Self-pay | Admitting: Vascular Surgery

## 2016-11-03 ENCOUNTER — Ambulatory Visit (INDEPENDENT_AMBULATORY_CARE_PROVIDER_SITE_OTHER): Payer: Self-pay | Admitting: Vascular Surgery

## 2016-11-03 VITALS — BP 149/64 | HR 69 | Resp 14 | Ht 61.5 in | Wt 107.0 lb

## 2016-11-03 DIAGNOSIS — Z992 Dependence on renal dialysis: Secondary | ICD-10-CM

## 2016-11-03 DIAGNOSIS — N186 End stage renal disease: Secondary | ICD-10-CM

## 2016-11-03 MED ORDER — ACETAMINOPHEN-CODEINE #3 300-30 MG PO TABS: 1.0000 | ORAL_TABLET | Freq: Four times a day (QID) | ORAL | 0 refills | Status: DC | PRN

## 2016-11-03 NOTE — Progress Notes (Signed)
Vitals:   11/03/16 1258  BP: (!) 151/60  Resp: 14  SpO2: 98%  Weight: 107 lb (48.5 kg)  Height: 5' 1.5" (1.562 m)

## 2016-11-03 NOTE — Progress Notes (Signed)
Patient name: Samantha Conrad MRN: [id number] DOB: 05/09/1941 Sex: female  REASON FOR VISIT: add-on  HPI: Samantha Conrad is a 76 y.o. female who presents with 4 day history of worsening right arm swelling and bruising. She underwent surgery 4 days ago on 10/30/16 for right upper arm graft and exploration of right wrist cephalic vein. She is currently dialyzing via a right IJ TDC without issues. She is having some tenderness around the tunnel site. Denies any pain or numbness in right hand. Denies fever or chills. Has never had left arm access. Is on daily aspirin. No blood thinners.  Her dialysis center noted that there was no thrill left graft yesterday.   Current Outpatient Prescriptions  Medication Sig Dispense Refill  . acetaminophen (TYLENOL) 325 MG tablet Take 650 mg by mouth every 6 (six) hours as needed for mild pain.    Marland Kitchen acetaminophen-codeine (TYLENOL #3) 300-30 MG tablet Take 1-2 tablets by mouth every 6 (six) hours as needed for moderate pain or severe pain. 15 tablet 0  . amLODipine (NORVASC) 2.5 MG tablet Take 2.5 mg by mouth daily.    Marland Kitchen aspirin EC 81 MG tablet Take 81 mg by mouth daily.    Marland Kitchen atorvastatin (LIPITOR) 20 MG tablet Take 20 mg by mouth daily.    . bisacodyl (DULCOLAX) 10 MG suppository Place 10 mg rectally as needed for moderate constipation.    . cetirizine (ZYRTEC) 10 MG tablet Take 10 mg by mouth daily as needed for allergies.     . ferric citrate (AURYXIA) 1 GM 210 MG(Fe) tablet Take 420 mg by mouth 3 (three) times daily with meals.    . levETIRAcetam (KEPPRA) 250 MG tablet Take 250 mg by mouth every 12 (twelve) hours.     Marland Kitchen levothyroxine (SYNTHROID, LEVOTHROID) 100 MCG tablet Take 100 mcg by mouth daily.  0  . loperamide (IMODIUM) 2 MG capsule Take by mouth as needed for diarrhea or loose stools.    . montelukast (SINGULAIR) 10 MG tablet TAKE 1 TABLET DAILY AS     DIRECTED 90 tablet 3  . Multiple Vitamin (MULTIVITAMIN) capsule Take 1 capsule by mouth daily.      . nitroGLYCERIN (NITROSTAT) 0.4 MG SL tablet Place 0.4 mg under the tongue every 5 (five) minutes as needed for chest pain.    . Pancrelipase, Lip-Prot-Amyl, (CREON) 24000-76000 units CPEP Take 2 capsules by mouth 3 (three) times daily with meals.     . polyethylene glycol (MIRALAX / GLYCOLAX) packet Take 17 g by mouth daily as needed for mild constipation.     . prednisoLONE acetate (PRED FORTE) 1 % ophthalmic suspension Place 1 drop into both eyes at bedtime.     . rosuvastatin (CRESTOR) 5 MG tablet Take 5 mg by mouth daily.    . traZODone (DESYREL) 50 MG tablet Take 50 mg by mouth at bedtime.     No current facility-administered medications for this visit.     REVIEW OF SYSTEMS:  [X]  denotes positive finding, [ ]  denotes negative finding Cardiac  Comments:  Chest pain or chest pressure:    Shortness of breath upon exertion:    Short of breath when lying flat:    Irregular heart rhythm:    Constitutional    Fever or chills:      PHYSICAL EXAM: Vitals:   11/03/16 1258 11/03/16 1300  BP: (!) 151/60 (!) 149/64  Pulse:  69  Resp: 14   SpO2: 98%   Weight: 107 lb (  48.5 kg)   Height: 5' 1.5" (1.562 m)     GENERAL: The patient is a well-nourished female, in no acute distress. The vital signs are documented above. VASCULAR: Left upper arm graft occluded. No bruit or thrill. Axillary, brachial and wrist incisions clean and intact. No drainage. Ecchymosis of right upper and lower arm. Hematoma around tunnel site.  2+ right radial pulse. Sensation/motor intact right hand.   MEDICAL ISSUES: Early occlusion right upper arm AVG  Advised use of warm compresses and arm elevation for swelling and ecchymosis. Currently has right IJ TDC. Will need new access. Assured patient that swelling and ecchymosis will resolve with time. Will have her follow up with Dr. Oneida Alar in 2-4 weeks to discuss new access.   Gave refill on pain medication Tylenol # 3, dispense #15, no refills.   Virgina Jock,  PA-C [facility] MD: Bridgett Larsson  Addendum  I have independently interviewed and examined the patient, and I agree with the physician assistant's findings.  RUA AVG is occluded on exam.  The swelling in RUA AVG appears to be consistent with a tunnel bleed which is draining to gravity dependent areas.  - Given the severity of the RUA AVG related bleeding , I would abandon the RUA AVG for now. - Recommended conservative measures for now - F/U Dr. Oneida Alar in the next 2-4 weeks once R arm has healed  Adele Barthel, MD, [facility] [place] Office: [phone] Pager: [phone]  11/03/2016, 5:40 PM

## 2016-11-15 ENCOUNTER — Encounter: Payer: Medicare Other | Admitting: Family

## 2016-11-16 ENCOUNTER — Encounter: Payer: Self-pay | Admitting: Vascular Surgery

## 2016-11-30 ENCOUNTER — Encounter: Payer: Self-pay | Admitting: Vascular Surgery

## 2016-11-30 ENCOUNTER — Other Ambulatory Visit: Payer: Self-pay

## 2016-11-30 ENCOUNTER — Ambulatory Visit (INDEPENDENT_AMBULATORY_CARE_PROVIDER_SITE_OTHER): Payer: Self-pay | Admitting: Vascular Surgery

## 2016-11-30 VITALS — BP 180/72 | HR 54 | Temp 97.8°F | Resp 16 | Ht 61.5 in | Wt 107.0 lb

## 2016-11-30 DIAGNOSIS — N186 End stage renal disease: Secondary | ICD-10-CM

## 2016-11-30 DIAGNOSIS — Z992 Dependence on renal dialysis: Secondary | ICD-10-CM

## 2016-11-30 NOTE — Progress Notes (Signed)
Patient is a 76 year old female who returns today for follow-up after placement of a right upper arm graft on 10/30/2016. The graft apparently occluded on 11/02/2016. She was seen by my partner Dr. Bridgett Larsson on 11/03/2016. At that point she was thought to be a candidate for thrombectomy due to severe inflammation over the graft. She returns today with much less swelling and inflammation over the graft. She continues to use her right side catheter. She denies any numbness or tingling in the right hand.  Physical exam:  Vitals:   11/30/16 1115 11/30/16 1117  BP: (!) 181/70 (!) 180/72  Pulse: (!) 54   Resp: 16   Temp: 97.8 F (36.6 C)   TempSrc: Oral   SpO2: 98%   Weight: 107 lb (48.5 kg)   Height: 5' 1.5" (1.562 m)     Right upper extremity: Mild erythema over the central portion of the graft which according to the patient is significantly improved. No significant edema. Upper arm and antecubital incision are healing well. Right hand is pink and warm. No audible bruit and graft.  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 Inflammation has subsided significantly of this point.  Plan: I discussed with the patient possibility of moving to her other arm versus trying a thrombectomy on her right arm. I believe it is worthwhile to do a thrombectomy on her right arm at least once to see if we can salvage the right arm graft. I discussed with her that there may be a high percentage chance that it reoccludes and it so we could always go to the left arm later. Other risks possible benefits including infection and bleeding were discussed patient today. She understands and agrees to proceed with right arm AV graft thrombectomy possible revision for 12/04/2016.  Ruta Hinds, MD [facility] [place] Office: [phone] Pager: [phone]

## 2016-12-01 ENCOUNTER — Encounter (HOSPITAL_COMMUNITY): Payer: Self-pay | Admitting: *Deleted

## 2016-12-01 NOTE — Progress Notes (Signed)
Spoke with pt for pre-op call. Pt denies cardiac history, chest pain or sob. Pt is a type 2 diabetic. Pt can't remember what her last A1C was or when it was. I have called Dr. Gildardo Pounds office and left message for them to call with A1C result. Pt states her fasting blood sugar is usually 110. Instructed pt to check her blood sugar Monday AM when she wakes up and every 2 hours until she leaves for the hospital. If blood sugar is 70 or below, treat with 1/2 cup of clear juice (apple or cranberry) and recheck blood sugar 15 minutes after drinking juice. If blood sugar continues to be 70 or below, call the Short Stay department and ask to speak to a nurse. Pt voiced understanding.

## 2016-12-04 ENCOUNTER — Ambulatory Visit (HOSPITAL_COMMUNITY)
Admission: RE | Admit: 2016-12-04 | Discharge: 2016-12-04 | Disposition: A | Discharge status: Home / Self Care | Payer: Medicare Other | Source: Ambulatory Visit | Attending: Vascular Surgery | Admitting: Vascular Surgery

## 2016-12-04 ENCOUNTER — Ambulatory Visit (HOSPITAL_COMMUNITY): Payer: Medicare Other | Admitting: Certified Registered Nurse Anesthetist

## 2016-12-04 ENCOUNTER — Encounter (HOSPITAL_COMMUNITY)
Admission: RE | Disposition: A | Discharge status: Home / Self Care | Payer: Self-pay | Source: Ambulatory Visit | Attending: Vascular Surgery

## 2016-12-04 ENCOUNTER — Encounter (HOSPITAL_COMMUNITY): Payer: Self-pay | Admitting: Surgery

## 2016-12-04 DIAGNOSIS — Z992 Dependence on renal dialysis: Secondary | ICD-10-CM | POA: Insufficient documentation

## 2016-12-04 DIAGNOSIS — G4730 Sleep apnea, unspecified: Secondary | ICD-10-CM | POA: Insufficient documentation

## 2016-12-04 DIAGNOSIS — K759 Inflammatory liver disease, unspecified: Secondary | ICD-10-CM | POA: Insufficient documentation

## 2016-12-04 DIAGNOSIS — X58XXXA Exposure to other specified factors, initial encounter: Secondary | ICD-10-CM | POA: Diagnosis not present

## 2016-12-04 DIAGNOSIS — T82868A Thrombosis of vascular prosthetic devices, implants and grafts, initial encounter: Secondary | ICD-10-CM | POA: Diagnosis not present

## 2016-12-04 DIAGNOSIS — N186 End stage renal disease: Secondary | ICD-10-CM | POA: Insufficient documentation

## 2016-12-04 DIAGNOSIS — R569 Unspecified convulsions: Secondary | ICD-10-CM | POA: Insufficient documentation

## 2016-12-04 DIAGNOSIS — I120 Hypertensive chronic kidney disease with stage 5 chronic kidney disease or end stage renal disease: Secondary | ICD-10-CM | POA: Insufficient documentation

## 2016-12-04 HISTORY — DX: Hypothyroidism, unspecified: E03.9

## 2016-12-04 HISTORY — PX: THROMBECTOMY AND REVISION OF ARTERIOVENTOUS (AV) GORETEX  GRAFT: SHX6120

## 2016-12-04 HISTORY — DX: Unspecified convulsions: R56.9

## 2016-12-04 LAB — GLUCOSE, CAPILLARY: GLUCOSE-CAPILLARY: 116 mg/dL — AB (ref 65–99)

## 2016-12-04 LAB — POCT I-STAT 4, (NA,K, GLUC, HGB,HCT)
Glucose, Bld: 129 mg/dL — ABNORMAL HIGH (ref 65–99)
HCT: 42 % (ref 36.0–46.0)
HEMOGLOBIN: 14.3 g/dL (ref 12.0–15.0)
POTASSIUM: 4.1 mmol/L (ref 3.5–5.1)
Sodium: 139 mmol/L (ref 135–145)

## 2016-12-04 SURGERY — THROMBECTOMY AND REVISION OF ARTERIOVENTOUS (AV) GORETEX  GRAFT
Anesthesia: Monitor Anesthesia Care | Site: Arm Upper | Laterality: Right

## 2016-12-04 MED ORDER — PROPOFOL 10 MG/ML IV BOLUS
INTRAVENOUS | Status: DC | PRN
  Administered 2016-12-04: 20 mg via INTRAVENOUS
  Administered 2016-12-04: 15 mg via INTRAVENOUS

## 2016-12-04 MED ORDER — PROTAMINE SULFATE 10 MG/ML IV SOLN
INTRAVENOUS | Status: DC | PRN
  Administered 2016-12-04: 20 mg via INTRAVENOUS
  Administered 2016-12-04: 30 mg via INTRAVENOUS

## 2016-12-04 MED ORDER — SODIUM CHLORIDE 0.9 % IV SOLN
INTRAVENOUS | Status: DC | PRN
  Administered 2016-12-04: 500 mL

## 2016-12-04 MED ORDER — FENTANYL CITRATE (PF) 100 MCG/2ML IJ SOLN: 25.0000 ug | INTRAMUSCULAR | Status: DC | PRN

## 2016-12-04 MED ORDER — FENTANYL CITRATE (PF) 100 MCG/2ML IJ SOLN
INTRAMUSCULAR | Status: DC | PRN
Start: 2016-12-04 — End: 2016-12-04
  Administered 2016-12-04 (×2): 25 ug via INTRAVENOUS

## 2016-12-04 MED ORDER — FENTANYL CITRATE (PF) 250 MCG/5ML IJ SOLN
INTRAMUSCULAR | Status: AC
  Filled 2016-12-04: qty 5

## 2016-12-04 MED ORDER — PROPOFOL 10 MG/ML IV BOLUS
INTRAVENOUS | Status: AC
  Filled 2016-12-04: qty 20

## 2016-12-04 MED ORDER — SODIUM CHLORIDE 0.9 % IV SOLN
INTRAVENOUS | Status: DC
  Administered 2016-12-04 (×3): via INTRAVENOUS

## 2016-12-04 MED ORDER — ACETAMINOPHEN-CODEINE #3 300-30 MG PO TABS: 1.0000 | ORAL_TABLET | Freq: Four times a day (QID) | ORAL | 0 refills | Status: DC | PRN

## 2016-12-04 MED ORDER — LIDOCAINE HCL (PF) 1 % IJ SOLN
INTRAMUSCULAR | Status: DC | PRN
  Administered 2016-12-04: 20 mL

## 2016-12-04 MED ORDER — PROPOFOL 500 MG/50ML IV EMUL
INTRAVENOUS | Status: DC | PRN
  Administered 2016-12-04: 50 ug/kg/min via INTRAVENOUS

## 2016-12-04 MED ORDER — CEFUROXIME SODIUM 1.5 G IV SOLR
INTRAVENOUS | Status: AC
  Filled 2016-12-04: qty 1.5

## 2016-12-04 MED ORDER — LIDOCAINE HCL (CARDIAC) 20 MG/ML IV SOLN
INTRAVENOUS | Status: DC | PRN
  Administered 2016-12-04: 60 mg via INTRAVENOUS

## 2016-12-04 MED ORDER — CHLORHEXIDINE GLUCONATE CLOTH 2 % EX PADS: 6.0000 | MEDICATED_PAD | Freq: Once | CUTANEOUS | Status: DC

## 2016-12-04 MED ORDER — LIDOCAINE HCL (PF) 1 % IJ SOLN
INTRAMUSCULAR | Status: AC
  Filled 2016-12-04: qty 30

## 2016-12-04 MED ORDER — GLYCOPYRROLATE 0.2 MG/ML IJ SOLN
INTRAMUSCULAR | Status: DC | PRN
  Administered 2016-12-04: 0.2 mg via INTRAVENOUS

## 2016-12-04 MED ORDER — DEXTROSE 5 % IV SOLN
1.5000 g | INTRAVENOUS | Status: AC
  Administered 2016-12-04: 1.5 g via INTRAVENOUS

## 2016-12-04 MED ORDER — MIDAZOLAM HCL 2 MG/2ML IJ SOLN
INTRAMUSCULAR | Status: AC
  Filled 2016-12-04: qty 2

## 2016-12-04 MED ORDER — 0.9 % SODIUM CHLORIDE (POUR BTL) OPTIME
TOPICAL | Status: DC | PRN
  Administered 2016-12-04: 1000 mL

## 2016-12-04 MED ORDER — HEPARIN SODIUM (PORCINE) 1000 UNIT/ML IJ SOLN
INTRAMUSCULAR | Status: DC | PRN
  Administered 2016-12-04: 5000 [IU] via INTRAVENOUS

## 2016-12-04 SURGICAL SUPPLY — 42 items
CANISTER SUCT 3000ML PPV (MISCELLANEOUS) ×2 IMPLANT
CANNULA VESSEL 3MM 2 BLNT TIP (CANNULA) ×2 IMPLANT
CATH EMB 3FR 80CM (CATHETERS) ×2 IMPLANT
CATH EMB 4FR 80CM (CATHETERS) ×2 IMPLANT
CATH EMB 5FR 80CM (CATHETERS) ×2 IMPLANT
DECANTER SPIKE VIAL GLASS SM (MISCELLANEOUS) ×2 IMPLANT
DERMABOND ADVANCED (GAUZE/BANDAGES/DRESSINGS) ×1
DERMABOND ADVANCED .7 DNX12 (GAUZE/BANDAGES/DRESSINGS) ×1 IMPLANT
ELECT REM PT RETURN 9FT ADLT (ELECTROSURGICAL) ×2
ELECTRODE REM PT RTRN 9FT ADLT (ELECTROSURGICAL) ×1 IMPLANT
GAUZE SPONGE 4X4 16PLY XRAY LF (GAUZE/BANDAGES/DRESSINGS) ×2 IMPLANT
GLOVE BIO SURGEON STRL SZ 6.5 (GLOVE) ×2 IMPLANT
GLOVE BIO SURGEON STRL SZ7.5 (GLOVE) ×2 IMPLANT
GLOVE BIOGEL PI IND STRL 6.5 (GLOVE) ×2 IMPLANT
GLOVE BIOGEL PI IND STRL 7.0 (GLOVE) ×1 IMPLANT
GLOVE BIOGEL PI IND STRL 7.5 (GLOVE) ×1 IMPLANT
GLOVE BIOGEL PI INDICATOR 6.5 (GLOVE) ×2
GLOVE BIOGEL PI INDICATOR 7.0 (GLOVE) ×1
GLOVE BIOGEL PI INDICATOR 7.5 (GLOVE) ×1
GLOVE ECLIPSE 6.5 STRL STRAW (GLOVE) ×2 IMPLANT
GLOVE ECLIPSE 7.0 STRL STRAW (GLOVE) ×2 IMPLANT
GLOVE SURG SS PI 6.0 STRL IVOR (GLOVE) ×2 IMPLANT
GOWN STRL REUS W/ TWL LRG LVL3 (GOWN DISPOSABLE) ×5 IMPLANT
GOWN STRL REUS W/TWL LRG LVL3 (GOWN DISPOSABLE) ×5
GRAFT GORETEX STRT 4-7X45 (Vascular Products) ×2 IMPLANT
KIT BASIN OR (CUSTOM PROCEDURE TRAY) ×2 IMPLANT
KIT ROOM TURNOVER OR (KITS) ×2 IMPLANT
LOOP VESSEL MAXI BLUE (MISCELLANEOUS) ×2 IMPLANT
LOOP VESSEL MINI RED (MISCELLANEOUS) IMPLANT
NEEDLE HYPO 25GX1X1/2 BEV (NEEDLE) ×2 IMPLANT
NS IRRIG 1000ML POUR BTL (IV SOLUTION) ×2 IMPLANT
PACK CV ACCESS (CUSTOM PROCEDURE TRAY) ×2 IMPLANT
PAD ARMBOARD 7.5X6 YLW CONV (MISCELLANEOUS) ×4 IMPLANT
SPONGE SURGIFOAM ABS GEL 100 (HEMOSTASIS) IMPLANT
SUT PROLENE 6 0 CC (SUTURE) ×6 IMPLANT
SUT PROLENE 7 0 BV 1 (SUTURE) ×2 IMPLANT
SUT VIC AB 3-0 SH 27 (SUTURE) ×2
SUT VIC AB 3-0 SH 27X BRD (SUTURE) ×2 IMPLANT
SUT VICRYL 4-0 PS2 18IN ABS (SUTURE) ×4 IMPLANT
SYR 3ML LL SCALE MARK (SYRINGE) ×4 IMPLANT
UNDERPAD 30X30 (UNDERPADS AND DIAPERS) ×2 IMPLANT
WATER STERILE IRR 1000ML POUR (IV SOLUTION) ×2 IMPLANT

## 2016-12-04 NOTE — Transfer of Care (Signed)
Immediate Anesthesia Transfer of Care Note  Patient: Samantha Conrad  Procedure(s) Performed: Procedure(s): THROMBECTOMY/ REVISION OF RIGHT UPPER ARM ARTERIOVENOUS GORETEX GRAFT (Right)  Patient Location: PACU  Anesthesia Type:MAC  Level of Consciousness: awake, alert  and oriented  Airway & Oxygen Therapy: Patient Spontanous Breathing  Post-op Assessment: Report given to RN and Post -op Vital signs reviewed and stable  Post vital signs: Reviewed and stable  Last Vitals:  Vitals:   12/04/16 1049 12/04/16 1345  BP: (!) 192/57   Pulse: (!) 57   Resp: 16   Temp: 36.5 C (!) (P) 36.3 C    Last Pain:  Vitals:   12/04/16 1345  TempSrc:   PainSc: (P) 0-No pain      Patients Stated Pain Goal: 3 (64/15/83 0940)  Complications: No apparent anesthesia complications

## 2016-12-04 NOTE — Anesthesia Preprocedure Evaluation (Addendum)
Anesthesia Evaluation  Patient identified by MRN, date of birth, ID band  Reviewed: Allergy & Precautions, NPO status , Patient's Chart, lab work & pertinent test results  Airway Mallampati: II   Neck ROM: Full    Dental  (+) Teeth Intact, Dental Advisory Given   Pulmonary sleep apnea ,    breath sounds clear to auscultation       Cardiovascular hypertension,  Rhythm:Regular Rate:Normal     Neuro/Psych Seizures -,     GI/Hepatic (+) Hepatitis -  Endo/Other  diabetesHypothyroidism   Renal/GU Renal disease     Musculoskeletal   Abdominal   Peds  Hematology   Anesthesia Other Findings   Reproductive/Obstetrics                             Anesthesia Physical Anesthesia Plan  ASA: III  Anesthesia Plan: MAC   Post-op Pain Management:    Induction: Intravenous  PONV Risk Score and Plan: 3 and Ondansetron, Dexamethasone, Propofol and Treatment may vary due to age or medical condition  Airway Management Planned: Natural Airway and Simple Face Mask  Additional Equipment:   Intra-op Plan:   Post-operative Plan:   Informed Consent: I have reviewed the patients History and Physical, chart, labs and discussed the procedure including the risks, benefits and alternatives for the proposed anesthesia with the patient or authorized representative who has indicated his/her understanding and acceptance.     Plan Discussed with: CRNA  Anesthesia Plan Comments:         Anesthesia Quick Evaluation

## 2016-12-04 NOTE — Interval H&P Note (Signed)
History and Physical Interval Note:  12/04/2016 10:59 AM  Samantha Conrad  has presented today for surgery, with the diagnosis of End Stage Renal Disease  N18.6  The various methods of treatment have been discussed with the patient and family. After consideration of risks, benefits and other options for treatment, the patient has consented to  Procedure(s): THROMBECTOMY/ REVISION OF RIGHT UPPER ARM ARTERIOVENOUS GORETEX GRAFT (Right) as a surgical intervention .  The patient's history has been reviewed, patient examined, no change in status, stable for surgery.  I have reviewed the patient's chart and labs.  Questions were answered to the patient's satisfaction.     Ruta Hinds

## 2016-12-04 NOTE — Op Note (Signed)
Procedure: Thrombectomy and revision of right upper arm AV graft  Preoperative diagnosis: Thrombosed AV graft right arm  Postoperative diagnosis: Same  Anesthesia: Local with IV sedation  Assistant: Gerri Lins, PA-C, Silva Bandy PA-C  Operative findings: 4-5 mm outflow vein, revised with 7 mm interposition graft end to side to more distal vein  Operative details: After obtaining informed consent, the patient was taken to the operating room. The patient was placed in supine position on the operating room table. After adequate sedation, the patient's entire right upper extremity was prepped and draped in the usual sterile fashion. Local anesthesia was infiltrated at a pre-existing scar in the axilla. A longitudinal incision was made in this location through a pre-existing scar. The incision was carried into the subcutaneous tissues down to level the graft. The graft was dissected free circumferentially. Dissection was carried down to the level of the venous anastomosis. This was dissected free circumferentially.  It was end to side.  The vein was freed up above and below the anastomosis. The patient was given 5,000 units of intravenous heparin. A transverse graftotomy was made just above the level of the anastomosis. A #4 Fogarty catheter was used to thrombectomize the venous limb of the graft. There was good venous backbleeding and this accepted a 3 4 and 5 dilator but it was snug.  I controlled the vein proximally and distally with fine bulldog clamps.   The arterial limb the graft was then thrombectomized with a #3 and #4 Fogarty catheter but I could not get this to cross the anastomosis so the preexisting antecubital incision was infiltrated with local and reopened.  Dissection was carried down to the arterial anastomosis and the artery dissected above and below this and controlled with vessel loops.  A transverse graftotomy was made just above this and the thrombus removed under direct vision.  At  this point there was excellent arterial inflow.  The graftotomy was repaired with a running 6 0 prolene. This was clamped proximally with a fistula clamp.   I took down the venous anastomosis and redid it end to side extending the venotomy more toward the shoulder where the vein widened more.  A new 7 mm interposition graft was brought up in the operative field and sewn end-to-side to the vein using a running 6-0 Prolene suture.This was then sewn end-to-end to the proximal  limb of the graft using running 6-0 Prolene suture. Just prior to completion, the anastomosis was forebled backbled and thoroughly flushed. The anastomosis was secured; clamps were released; and there was a palpable thrill in the graft immediately. Marland KitchenHemostasis was obtained with direct pressure and the assistance of 50 mg of protamine. The subcutaneous tissues were reapproximated with running 3-0 Vicryl suture. The skin was closed with a 4 0 Vicryl subcuticular stitch. Dermabond was applied the incision. The patient tolerated the procedure well and there were no complications. Instrument sponge and needle counts were correct at the end of the case. The patient was taken to the recovery room in stable condition.  Ruta Hinds, MD [facility] Office: [phone] Pager: [phone]

## 2016-12-04 NOTE — Anesthesia Postprocedure Evaluation (Signed)
Anesthesia Post Note  Patient: Samantha Conrad  Procedure(s) Performed: Procedure(s) (LRB): THROMBECTOMY/ REVISION OF RIGHT UPPER ARM ARTERIOVENOUS GORETEX GRAFT (Right)     Patient location during evaluation: PACU Anesthesia Type: MAC Level of consciousness: awake and alert Pain management: pain level controlled Vital Signs Assessment: post-procedure vital signs reviewed and stable Respiratory status: spontaneous breathing, nonlabored ventilation, respiratory function stable and patient connected to nasal cannula oxygen Cardiovascular status: stable and blood pressure returned to baseline Anesthetic complications: no    Last Vitals:  Vitals:   12/04/16 1400 12/04/16 1410  BP: (!) 128/95 (!) 146/68  Pulse: 68 66  Resp: (!) 22 (!) 21  Temp:  (!) 36.3 C    Last Pain:  Vitals:   12/04/16 1410  TempSrc:   PainSc: 0-No pain                 Angelly Spearing,JAMES TERRILL

## 2016-12-04 NOTE — H&P (View-Only) (Signed)
Patient is a 76 year old female who returns today for follow-up after placement of a right upper arm graft on 10/30/2016. The graft apparently occluded on 11/02/2016. She was seen by my partner Dr. Bridgett Larsson on 11/03/2016. At that point she was thought to be a candidate for thrombectomy due to severe inflammation over the graft. She returns today with much less swelling and inflammation over the graft. She continues to use her right side catheter. She denies any numbness or tingling in the right hand.  Physical exam:  Vitals:   11/30/16 1115 11/30/16 1117  BP: (!) 181/70 (!) 180/72  Pulse: (!) 54   Resp: 16   Temp: 97.8 F (36.6 C)   TempSrc: Oral   SpO2: 98%   Weight: 107 lb (48.5 kg)   Height: 5' 1.5" (1.562 m)     Right upper extremity: Mild erythema over the central portion of the graft which according to the patient is significantly improved. No significant edema. Upper arm and antecubital incision are healing well. Right hand is pink and warm. No audible bruit and graft.  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 Inflammation has subsided significantly of this point.  Plan: I discussed with the patient possibility of moving to her other arm versus trying a thrombectomy on her right arm. I believe it is worthwhile to do a thrombectomy on her right arm at least once to see if we can salvage the right arm graft. I discussed with her that there may be a high percentage chance that it reoccludes and it so we could always go to the left arm later. Other risks possible benefits including infection and bleeding were discussed patient today. She understands and agrees to proceed with right arm AV graft thrombectomy possible revision for 12/04/2016.  Ruta Hinds, MD [facility] [place] Office: [phone] Pager: [phone]

## 2016-12-05 ENCOUNTER — Encounter (HOSPITAL_COMMUNITY): Payer: Self-pay | Admitting: Vascular Surgery

## 2017-03-10 ENCOUNTER — Encounter (HOSPITAL_COMMUNITY): Payer: Self-pay | Admitting: *Deleted

## 2017-03-10 ENCOUNTER — Inpatient Hospital Stay (HOSPITAL_COMMUNITY)
Admission: EM | Admit: 2017-03-10 | Discharge: 2017-03-16 | DRG: 919 | Disposition: A | Discharge status: Home / Self Care | Payer: Medicare Other | Attending: Family Medicine | Admitting: Family Medicine

## 2017-03-10 DIAGNOSIS — I69311 Memory deficit following cerebral infarction: Secondary | ICD-10-CM

## 2017-03-10 DIAGNOSIS — R109 Unspecified abdominal pain: Secondary | ICD-10-CM | POA: Diagnosis present

## 2017-03-10 DIAGNOSIS — I69339 Monoplegia of upper limb following cerebral infarction affecting unspecified side: Secondary | ICD-10-CM

## 2017-03-10 DIAGNOSIS — Y838 Other surgical procedures as the cause of abnormal reaction of the patient, or of later complication, without mention of misadventure at the time of the procedure: Secondary | ICD-10-CM | POA: Diagnosis present

## 2017-03-10 DIAGNOSIS — Z9049 Acquired absence of other specified parts of digestive tract: Secondary | ICD-10-CM

## 2017-03-10 DIAGNOSIS — E1122 Type 2 diabetes mellitus with diabetic chronic kidney disease: Secondary | ICD-10-CM | POA: Diagnosis present

## 2017-03-10 DIAGNOSIS — K7581 Nonalcoholic steatohepatitis (NASH): Secondary | ICD-10-CM | POA: Diagnosis present

## 2017-03-10 DIAGNOSIS — E1165 Type 2 diabetes mellitus with hyperglycemia: Secondary | ICD-10-CM | POA: Diagnosis present

## 2017-03-10 DIAGNOSIS — I959 Hypotension, unspecified: Secondary | ICD-10-CM | POA: Diagnosis present

## 2017-03-10 DIAGNOSIS — E119 Type 2 diabetes mellitus without complications: Secondary | ICD-10-CM

## 2017-03-10 DIAGNOSIS — D631 Anemia in chronic kidney disease: Secondary | ICD-10-CM | POA: Diagnosis present

## 2017-03-10 DIAGNOSIS — K5909 Other constipation: Secondary | ICD-10-CM | POA: Diagnosis present

## 2017-03-10 DIAGNOSIS — Z85828 Personal history of other malignant neoplasm of skin: Secondary | ICD-10-CM

## 2017-03-10 DIAGNOSIS — Z79899 Other long term (current) drug therapy: Secondary | ICD-10-CM

## 2017-03-10 DIAGNOSIS — H538 Other visual disturbances: Secondary | ICD-10-CM | POA: Diagnosis present

## 2017-03-10 DIAGNOSIS — Z7982 Long term (current) use of aspirin: Secondary | ICD-10-CM

## 2017-03-10 DIAGNOSIS — D509 Iron deficiency anemia, unspecified: Secondary | ICD-10-CM | POA: Diagnosis present

## 2017-03-10 DIAGNOSIS — J302 Other seasonal allergic rhinitis: Secondary | ICD-10-CM | POA: Diagnosis present

## 2017-03-10 DIAGNOSIS — K659 Peritonitis, unspecified: Secondary | ICD-10-CM | POA: Diagnosis present

## 2017-03-10 DIAGNOSIS — D649 Anemia, unspecified: Secondary | ICD-10-CM | POA: Diagnosis present

## 2017-03-10 DIAGNOSIS — N186 End stage renal disease: Secondary | ICD-10-CM

## 2017-03-10 DIAGNOSIS — N2581 Secondary hyperparathyroidism of renal origin: Secondary | ICD-10-CM | POA: Diagnosis present

## 2017-03-10 DIAGNOSIS — R569 Unspecified convulsions: Secondary | ICD-10-CM

## 2017-03-10 DIAGNOSIS — K861 Other chronic pancreatitis: Secondary | ICD-10-CM | POA: Diagnosis present

## 2017-03-10 DIAGNOSIS — T8571XA Infection and inflammatory reaction due to peritoneal dialysis catheter, initial encounter: Secondary | ICD-10-CM | POA: Diagnosis not present

## 2017-03-10 DIAGNOSIS — E8770 Fluid overload, unspecified: Secondary | ICD-10-CM | POA: Diagnosis present

## 2017-03-10 DIAGNOSIS — I69398 Other sequelae of cerebral infarction: Secondary | ICD-10-CM

## 2017-03-10 DIAGNOSIS — Z79890 Hormone replacement therapy (postmenopausal): Secondary | ICD-10-CM

## 2017-03-10 DIAGNOSIS — E785 Hyperlipidemia, unspecified: Secondary | ICD-10-CM | POA: Diagnosis present

## 2017-03-10 DIAGNOSIS — Z7952 Long term (current) use of systemic steroids: Secondary | ICD-10-CM

## 2017-03-10 DIAGNOSIS — K7460 Unspecified cirrhosis of liver: Secondary | ICD-10-CM | POA: Diagnosis present

## 2017-03-10 DIAGNOSIS — K5900 Constipation, unspecified: Secondary | ICD-10-CM | POA: Diagnosis present

## 2017-03-10 DIAGNOSIS — G40909 Epilepsy, unspecified, not intractable, without status epilepticus: Secondary | ICD-10-CM | POA: Diagnosis present

## 2017-03-10 DIAGNOSIS — D696 Thrombocytopenia, unspecified: Secondary | ICD-10-CM | POA: Diagnosis present

## 2017-03-10 DIAGNOSIS — Z90710 Acquired absence of both cervix and uterus: Secondary | ICD-10-CM

## 2017-03-10 DIAGNOSIS — Z91041 Radiographic dye allergy status: Secondary | ICD-10-CM

## 2017-03-10 DIAGNOSIS — E039 Hypothyroidism, unspecified: Secondary | ICD-10-CM | POA: Diagnosis present

## 2017-03-10 DIAGNOSIS — Z992 Dependence on renal dialysis: Secondary | ICD-10-CM

## 2017-03-10 DIAGNOSIS — I10 Essential (primary) hypertension: Secondary | ICD-10-CM | POA: Diagnosis present

## 2017-03-10 DIAGNOSIS — F329 Major depressive disorder, single episode, unspecified: Secondary | ICD-10-CM | POA: Diagnosis present

## 2017-03-10 DIAGNOSIS — I120 Hypertensive chronic kidney disease with stage 5 chronic kidney disease or end stage renal disease: Secondary | ICD-10-CM | POA: Diagnosis present

## 2017-03-10 LAB — HEMOGLOBIN AND HEMATOCRIT, BLOOD
HEMATOCRIT: 24 % — AB (ref 36.0–46.0)
HEMOGLOBIN: 7.6 g/dL — AB (ref 12.0–15.0)

## 2017-03-10 LAB — SAMPLE TO BLOOD BANK

## 2017-03-10 LAB — CBC
HEMATOCRIT: 24.3 % — AB (ref 36.0–46.0)
HEMOGLOBIN: 7.7 g/dL — AB (ref 12.0–15.0)
MCH: 30 pg (ref 26.0–34.0)
MCHC: 31.7 g/dL (ref 30.0–36.0)
MCV: 94.6 fL (ref 78.0–100.0)
Platelets: 95 10*3/uL — ABNORMAL LOW (ref 150–400)
RBC: 2.57 MIL/uL — AB (ref 3.87–5.11)
RDW: 14.2 % (ref 11.5–15.5)
WBC: 6.4 10*3/uL (ref 4.0–10.5)

## 2017-03-10 LAB — COMPREHENSIVE METABOLIC PANEL
ALK PHOS: 77 U/L (ref 38–126)
ALT: 24 U/L (ref 14–54)
AST: 26 U/L (ref 15–41)
Albumin: 3 g/dL — ABNORMAL LOW (ref 3.5–5.0)
Anion gap: 13 (ref 5–15)
BILIRUBIN TOTAL: 0.5 mg/dL (ref 0.3–1.2)
BUN: 46 mg/dL — AB (ref 6–20)
CO2: 25 mmol/L (ref 22–32)
Calcium: 8.2 mg/dL — ABNORMAL LOW (ref 8.9–10.3)
Chloride: 96 mmol/L — ABNORMAL LOW (ref 101–111)
Creatinine, Ser: 4.68 mg/dL — ABNORMAL HIGH (ref 0.44–1.00)
GFR, EST AFRICAN AMERICAN: 9 mL/min — AB (ref >60)
GFR, EST NON AFRICAN AMERICAN: 8 mL/min — AB (ref >60)
Glucose, Bld: 205 mg/dL — ABNORMAL HIGH (ref 65–99)
Potassium: 2.9 mmol/L — ABNORMAL LOW (ref 3.5–5.1)
Sodium: 134 mmol/L — ABNORMAL LOW (ref 135–145)
Total Protein: 5.8 g/dL — ABNORMAL LOW (ref 6.5–8.1)

## 2017-03-10 NOTE — ED Triage Notes (Signed)
The pt is on peritoneal dialysis but for the past 2 weeks she has been on hemodialysis and was supposed to be dialyzed today which she missed because she felt so bad.  Fistula rt upper arm  Hx of anemia  She is also c/o abd pain  pale

## 2017-03-10 NOTE — ED Notes (Signed)
Pt discussed with dr Vanita Panda concern for a drop in hgb  She has been in waiting for 5 hours  Repeat h and h

## 2017-03-11 ENCOUNTER — Emergency Department (HOSPITAL_COMMUNITY): Payer: Medicare Other

## 2017-03-11 ENCOUNTER — Encounter (HOSPITAL_COMMUNITY): Payer: Self-pay | Admitting: *Deleted

## 2017-03-11 ENCOUNTER — Inpatient Hospital Stay (HOSPITAL_COMMUNITY): Payer: Medicare Other

## 2017-03-11 DIAGNOSIS — R109 Unspecified abdominal pain: Secondary | ICD-10-CM | POA: Diagnosis present

## 2017-03-11 DIAGNOSIS — I69339 Monoplegia of upper limb following cerebral infarction affecting unspecified side: Secondary | ICD-10-CM | POA: Diagnosis not present

## 2017-03-11 DIAGNOSIS — G40909 Epilepsy, unspecified, not intractable, without status epilepticus: Secondary | ICD-10-CM | POA: Diagnosis present

## 2017-03-11 DIAGNOSIS — K7460 Unspecified cirrhosis of liver: Secondary | ICD-10-CM | POA: Diagnosis present

## 2017-03-11 DIAGNOSIS — I120 Hypertensive chronic kidney disease with stage 5 chronic kidney disease or end stage renal disease: Secondary | ICD-10-CM | POA: Diagnosis present

## 2017-03-11 DIAGNOSIS — N186 End stage renal disease: Secondary | ICD-10-CM

## 2017-03-11 DIAGNOSIS — D649 Anemia, unspecified: Secondary | ICD-10-CM | POA: Diagnosis present

## 2017-03-11 DIAGNOSIS — K7581 Nonalcoholic steatohepatitis (NASH): Secondary | ICD-10-CM | POA: Diagnosis present

## 2017-03-11 DIAGNOSIS — E8770 Fluid overload, unspecified: Secondary | ICD-10-CM | POA: Diagnosis present

## 2017-03-11 DIAGNOSIS — K5900 Constipation, unspecified: Secondary | ICD-10-CM | POA: Diagnosis not present

## 2017-03-11 DIAGNOSIS — T8571XA Infection and inflammatory reaction due to peritoneal dialysis catheter, initial encounter: Secondary | ICD-10-CM | POA: Diagnosis present

## 2017-03-11 DIAGNOSIS — E1165 Type 2 diabetes mellitus with hyperglycemia: Secondary | ICD-10-CM | POA: Diagnosis present

## 2017-03-11 DIAGNOSIS — D631 Anemia in chronic kidney disease: Secondary | ICD-10-CM | POA: Diagnosis present

## 2017-03-11 DIAGNOSIS — K659 Peritonitis, unspecified: Secondary | ICD-10-CM | POA: Diagnosis present

## 2017-03-11 DIAGNOSIS — K861 Other chronic pancreatitis: Secondary | ICD-10-CM | POA: Diagnosis present

## 2017-03-11 DIAGNOSIS — H538 Other visual disturbances: Secondary | ICD-10-CM | POA: Diagnosis present

## 2017-03-11 DIAGNOSIS — D509 Iron deficiency anemia, unspecified: Secondary | ICD-10-CM | POA: Diagnosis present

## 2017-03-11 DIAGNOSIS — E785 Hyperlipidemia, unspecified: Secondary | ICD-10-CM | POA: Diagnosis present

## 2017-03-11 DIAGNOSIS — I69311 Memory deficit following cerebral infarction: Secondary | ICD-10-CM | POA: Diagnosis not present

## 2017-03-11 DIAGNOSIS — K5909 Other constipation: Secondary | ICD-10-CM | POA: Diagnosis present

## 2017-03-11 DIAGNOSIS — N2581 Secondary hyperparathyroidism of renal origin: Secondary | ICD-10-CM | POA: Diagnosis present

## 2017-03-11 DIAGNOSIS — Z992 Dependence on renal dialysis: Secondary | ICD-10-CM | POA: Diagnosis not present

## 2017-03-11 DIAGNOSIS — D696 Thrombocytopenia, unspecified: Secondary | ICD-10-CM | POA: Diagnosis present

## 2017-03-11 DIAGNOSIS — E119 Type 2 diabetes mellitus without complications: Secondary | ICD-10-CM

## 2017-03-11 DIAGNOSIS — R569 Unspecified convulsions: Secondary | ICD-10-CM

## 2017-03-11 DIAGNOSIS — I69398 Other sequelae of cerebral infarction: Secondary | ICD-10-CM | POA: Diagnosis not present

## 2017-03-11 DIAGNOSIS — E039 Hypothyroidism, unspecified: Secondary | ICD-10-CM | POA: Diagnosis present

## 2017-03-11 DIAGNOSIS — Y838 Other surgical procedures as the cause of abnormal reaction of the patient, or of later complication, without mention of misadventure at the time of the procedure: Secondary | ICD-10-CM | POA: Diagnosis present

## 2017-03-11 DIAGNOSIS — I10 Essential (primary) hypertension: Secondary | ICD-10-CM | POA: Diagnosis present

## 2017-03-11 DIAGNOSIS — E1122 Type 2 diabetes mellitus with diabetic chronic kidney disease: Secondary | ICD-10-CM | POA: Diagnosis present

## 2017-03-11 DIAGNOSIS — I959 Hypotension, unspecified: Secondary | ICD-10-CM | POA: Diagnosis present

## 2017-03-11 LAB — CBC WITH DIFFERENTIAL/PLATELET
Basophils Absolute: 0 10*3/uL (ref 0.0–0.1)
Basophils Relative: 0 %
Eosinophils Absolute: 0.1 10*3/uL (ref 0.0–0.7)
Eosinophils Relative: 2 %
HEMATOCRIT: 28.9 % — AB (ref 36.0–46.0)
Hemoglobin: 9.3 g/dL — ABNORMAL LOW (ref 12.0–15.0)
LYMPHS PCT: 19 %
Lymphs Abs: 1.2 10*3/uL (ref 0.7–4.0)
MCH: 29.8 pg (ref 26.0–34.0)
MCHC: 32.2 g/dL (ref 30.0–36.0)
MCV: 92.6 fL (ref 78.0–100.0)
MONO ABS: 0.7 10*3/uL (ref 0.1–1.0)
MONOS PCT: 11 %
NEUTROS ABS: 4.1 10*3/uL (ref 1.7–7.7)
Neutrophils Relative %: 68 %
Platelets: 88 10*3/uL — ABNORMAL LOW (ref 150–400)
RBC: 3.12 MIL/uL — ABNORMAL LOW (ref 3.87–5.11)
RDW: 15.5 % (ref 11.5–15.5)
WBC: 6.1 10*3/uL (ref 4.0–10.5)

## 2017-03-11 LAB — ABO/RH: ABO/RH(D): A POS

## 2017-03-11 LAB — CBG MONITORING, ED: GLUCOSE-CAPILLARY: 105 mg/dL — AB (ref 65–99)

## 2017-03-11 LAB — RENAL FUNCTION PANEL
Albumin: 2.7 g/dL — ABNORMAL LOW (ref 3.5–5.0)
Anion gap: 10 (ref 5–15)
BUN: 49 mg/dL — AB (ref 6–20)
CHLORIDE: 102 mmol/L (ref 101–111)
CO2: 24 mmol/L (ref 22–32)
Calcium: 7.9 mg/dL — ABNORMAL LOW (ref 8.9–10.3)
Creatinine, Ser: 5.19 mg/dL — ABNORMAL HIGH (ref 0.44–1.00)
GFR calc Af Amer: 8 mL/min — ABNORMAL LOW (ref >60)
GFR calc non Af Amer: 7 mL/min — ABNORMAL LOW (ref >60)
GLUCOSE: 222 mg/dL — AB (ref 65–99)
POTASSIUM: 3.8 mmol/L (ref 3.5–5.1)
Phosphorus: 5 mg/dL — ABNORMAL HIGH (ref 2.5–4.6)
Sodium: 136 mmol/L (ref 135–145)

## 2017-03-11 LAB — URINALYSIS, ROUTINE W REFLEX MICROSCOPIC
Bilirubin Urine: NEGATIVE
Hgb urine dipstick: NEGATIVE
KETONES UR: NEGATIVE mg/dL
Nitrite: NEGATIVE
PH: 7 (ref 5.0–8.0)
Protein, ur: 30 mg/dL — AB
Specific Gravity, Urine: 1.007 (ref 1.005–1.030)

## 2017-03-11 LAB — LIPASE, BLOOD: LIPASE: 12 U/L (ref 11–51)

## 2017-03-11 LAB — MRSA PCR SCREENING: MRSA by PCR: NEGATIVE

## 2017-03-11 LAB — TSH: TSH: 4.262 u[IU]/mL (ref 0.350–4.500)

## 2017-03-11 LAB — IRON AND TIBC
Iron: 48 ug/dL (ref 28–170)
SATURATION RATIOS: 19 % (ref 10.4–31.8)
TIBC: 251 ug/dL (ref 250–450)
UIBC: 203 ug/dL

## 2017-03-11 LAB — GLUCOSE, CAPILLARY
GLUCOSE-CAPILLARY: 180 mg/dL — AB (ref 65–99)
GLUCOSE-CAPILLARY: 104 mg/dL — AB (ref 65–99)

## 2017-03-11 LAB — HEMOGLOBIN A1C
HEMOGLOBIN A1C: 6.8 % — AB (ref 4.8–5.6)
Mean Plasma Glucose: 148.46 mg/dL

## 2017-03-11 LAB — VITAMIN B12: VITAMIN B 12: 696 pg/mL (ref 180–914)

## 2017-03-11 LAB — PROTIME-INR
INR: 1.02
Prothrombin Time: 13.3 seconds (ref 11.4–15.2)

## 2017-03-11 LAB — FOLATE: FOLATE: 27 ng/mL (ref >5.9)

## 2017-03-11 LAB — RETICULOCYTES
RBC.: 2.57 MIL/uL — AB (ref 3.87–5.11)
Retic Count, Absolute: 143.9 10*3/uL (ref 19.0–186.0)
Retic Ct Pct: 5.6 % — ABNORMAL HIGH (ref 0.4–3.1)

## 2017-03-11 LAB — PREPARE RBC (CROSSMATCH)

## 2017-03-11 LAB — POC OCCULT BLOOD, ED: Fecal Occult Bld: NEGATIVE

## 2017-03-11 LAB — FERRITIN: Ferritin: 1058 ng/mL — ABNORMAL HIGH (ref 11–307)

## 2017-03-11 MED ORDER — PREDNISOLONE ACETATE 1 % OP SUSP
1.0000 [drp] | Freq: Every day | OPHTHALMIC | Status: DC
  Administered 2017-03-11 – 2017-03-15 (×5): 1 [drp] via OPHTHALMIC
  Filled 2017-03-11: qty 1

## 2017-03-11 MED ORDER — SODIUM CHLORIDE 0.9% FLUSH
3.0000 mL | Freq: Two times a day (BID) | INTRAVENOUS | Status: DC
  Administered 2017-03-11 – 2017-03-16 (×11): 3 mL via INTRAVENOUS

## 2017-03-11 MED ORDER — POTASSIUM CHLORIDE CRYS ER 20 MEQ PO TBCR: 40.0000 meq | EXTENDED_RELEASE_TABLET | ORAL | Status: AC

## 2017-03-11 MED ORDER — IOPAMIDOL (ISOVUE-300) INJECTION 61%
INTRAVENOUS | Status: AC
  Filled 2017-03-11: qty 30

## 2017-03-11 MED ORDER — ACETAMINOPHEN-CODEINE #3 300-30 MG PO TABS
1.0000 | ORAL_TABLET | Freq: Four times a day (QID) | ORAL | Status: DC | PRN
  Administered 2017-03-15: 1 via ORAL
  Filled 2017-03-11: qty 1

## 2017-03-11 MED ORDER — NEPRO/CARBSTEADY PO LIQD
237.0000 mL | Freq: Two times a day (BID) | ORAL | Status: DC
Start: 2017-03-11 — End: 2017-03-16
  Administered 2017-03-11 – 2017-03-16 (×7): 237 mL via ORAL

## 2017-03-11 MED ORDER — PANCRELIPASE (LIP-PROT-AMYL) 12000-38000 UNITS PO CPEP
24000.0000 [IU] | ORAL_CAPSULE | Freq: Three times a day (TID) | ORAL | Status: DC
  Administered 2017-03-11 – 2017-03-14 (×6): 24000 [IU] via ORAL
  Filled 2017-03-11 (×7): qty 2

## 2017-03-11 MED ORDER — MULTIVITAMINS PO CAPS: 1.0000 | ORAL_CAPSULE | Freq: Every day | ORAL | Status: DC

## 2017-03-11 MED ORDER — ACETAMINOPHEN 650 MG RE SUPP: 650.0000 mg | Freq: Four times a day (QID) | RECTAL | Status: DC | PRN

## 2017-03-11 MED ORDER — LINAGLIPTIN 5 MG PO TABS
5.0000 mg | ORAL_TABLET | Freq: Every day | ORAL | Status: DC
  Administered 2017-03-11 – 2017-03-16 (×6): 5 mg via ORAL
  Filled 2017-03-11 (×7): qty 1

## 2017-03-11 MED ORDER — DEXTROSE 5 % IV SOLN
2.0000 g | INTRAVENOUS | Status: DC
  Administered 2017-03-11 – 2017-03-12 (×2): 2 g via INTRAVENOUS
  Filled 2017-03-11 (×3): qty 2

## 2017-03-11 MED ORDER — FENTANYL CITRATE (PF) 100 MCG/2ML IJ SOLN
50.0000 ug | Freq: Once | INTRAMUSCULAR | Status: AC
  Administered 2017-03-11: 50 ug via INTRAVENOUS
  Filled 2017-03-11: qty 2

## 2017-03-11 MED ORDER — ASPIRIN EC 81 MG PO TBEC: 81.0000 mg | DELAYED_RELEASE_TABLET | Freq: Every day | ORAL | Status: DC

## 2017-03-11 MED ORDER — ONDANSETRON HCL 4 MG/2ML IJ SOLN: 4.0000 mg | Freq: Four times a day (QID) | INTRAMUSCULAR | Status: DC | PRN

## 2017-03-11 MED ORDER — POLYETHYLENE GLYCOL 3350 17 G PO PACK
17.0000 g | PACK | Freq: Every day | ORAL | Status: DC
  Administered 2017-03-11 – 2017-03-12 (×2): 17 g via ORAL
  Filled 2017-03-11 (×2): qty 1

## 2017-03-11 MED ORDER — PROSIGHT PO TABS
1.0000 | ORAL_TABLET | Freq: Every day | ORAL | Status: DC
  Administered 2017-03-12 – 2017-03-16 (×5): 1 via ORAL
  Filled 2017-03-11 (×5): qty 1

## 2017-03-11 MED ORDER — POTASSIUM CHLORIDE CRYS ER 20 MEQ PO TBCR
40.0000 meq | EXTENDED_RELEASE_TABLET | Freq: Once | ORAL | Status: AC
  Administered 2017-03-11: 40 meq via ORAL
  Filled 2017-03-11: qty 2

## 2017-03-11 MED ORDER — DEXTROSE 5 % IV SOLN
1.0000 g | Freq: Once | INTRAVENOUS | Status: AC
  Administered 2017-03-11: 1 g via INTRAVENOUS
  Filled 2017-03-11: qty 10

## 2017-03-11 MED ORDER — LEVETIRACETAM 250 MG PO TABS
250.0000 mg | ORAL_TABLET | Freq: Two times a day (BID) | ORAL | Status: DC
  Administered 2017-03-11 – 2017-03-16 (×11): 250 mg via ORAL
  Filled 2017-03-11 (×11): qty 1

## 2017-03-11 MED ORDER — ONDANSETRON HCL 4 MG PO TABS: 4.0000 mg | ORAL_TABLET | Freq: Four times a day (QID) | ORAL | Status: DC | PRN

## 2017-03-11 MED ORDER — ONDANSETRON HCL 4 MG/2ML IJ SOLN
4.0000 mg | Freq: Once | INTRAMUSCULAR | Status: AC
  Administered 2017-03-11: 4 mg via INTRAVENOUS
  Filled 2017-03-11: qty 2

## 2017-03-11 MED ORDER — HEPARIN 1000 UNIT/ML FOR PERITONEAL DIALYSIS: 500.0000 [IU] | INTRAMUSCULAR | Status: DC | PRN

## 2017-03-11 MED ORDER — ATORVASTATIN CALCIUM 20 MG PO TABS
20.0000 mg | ORAL_TABLET | Freq: Every day | ORAL | Status: DC
Start: 2017-03-11 — End: 2017-03-16
  Administered 2017-03-11 – 2017-03-15 (×5): 20 mg via ORAL
  Filled 2017-03-11 (×5): qty 1

## 2017-03-11 MED ORDER — FERRIC CITRATE 1 GM 210 MG(FE) PO TABS
420.0000 mg | ORAL_TABLET | Freq: Three times a day (TID) | ORAL | Status: DC
  Administered 2017-03-11 – 2017-03-16 (×13): 420 mg via ORAL
  Filled 2017-03-11 (×16): qty 2

## 2017-03-11 MED ORDER — INSULIN ASPART 100 UNIT/ML ~~LOC~~ SOLN
0.0000 [IU] | Freq: Every day | SUBCUTANEOUS | Status: DC
  Administered 2017-03-14: 2 [IU] via SUBCUTANEOUS
  Administered 2017-03-15: 5 [IU] via SUBCUTANEOUS

## 2017-03-11 MED ORDER — DOCUSATE SODIUM 100 MG PO CAPS
100.0000 mg | ORAL_CAPSULE | Freq: Two times a day (BID) | ORAL | Status: DC
  Administered 2017-03-11 – 2017-03-12 (×3): 100 mg via ORAL
  Filled 2017-03-11 (×3): qty 1

## 2017-03-11 MED ORDER — PANTOPRAZOLE SODIUM 40 MG IV SOLR
40.0000 mg | Freq: Two times a day (BID) | INTRAVENOUS | Status: DC
  Administered 2017-03-11 – 2017-03-12 (×4): 40 mg via INTRAVENOUS
  Filled 2017-03-11 (×5): qty 40

## 2017-03-11 MED ORDER — INSULIN ASPART 100 UNIT/ML ~~LOC~~ SOLN
0.0000 [IU] | Freq: Three times a day (TID) | SUBCUTANEOUS | Status: DC
  Administered 2017-03-11: 2 [IU] via SUBCUTANEOUS
  Administered 2017-03-12 – 2017-03-13 (×2): 5 [IU] via SUBCUTANEOUS
  Administered 2017-03-14: 1 [IU] via SUBCUTANEOUS
  Administered 2017-03-14: 3 [IU] via SUBCUTANEOUS
  Administered 2017-03-15: 2 [IU] via SUBCUTANEOUS
  Administered 2017-03-15: 1 [IU] via SUBCUTANEOUS
  Administered 2017-03-15 – 2017-03-16 (×2): 2 [IU] via SUBCUTANEOUS

## 2017-03-11 MED ORDER — MORPHINE SULFATE (PF) 4 MG/ML IV SOLN: 1.0000 mg | INTRAVENOUS | Status: DC | PRN

## 2017-03-11 MED ORDER — ACETAMINOPHEN 325 MG PO TABS: 650.0000 mg | ORAL_TABLET | Freq: Four times a day (QID) | ORAL | Status: DC | PRN

## 2017-03-11 MED ORDER — SODIUM CHLORIDE 0.9 % IV SOLN
Freq: Once | INTRAVENOUS | Status: AC
  Administered 2017-03-11: 02:00:00 via INTRAVENOUS

## 2017-03-11 MED ORDER — BARIUM SULFATE 2.1 % PO SUSP
ORAL | Status: AC
  Filled 2017-03-11: qty 2

## 2017-03-11 MED ORDER — LEVOTHYROXINE SODIUM 100 MCG PO TABS
100.0000 ug | ORAL_TABLET | Freq: Every day | ORAL | Status: DC
  Administered 2017-03-12 – 2017-03-16 (×5): 100 ug via ORAL
  Filled 2017-03-11 (×5): qty 1

## 2017-03-11 MED ORDER — PANTOPRAZOLE SODIUM 40 MG IV SOLR
40.0000 mg | Freq: Once | INTRAVENOUS | Status: AC
  Administered 2017-03-11: 40 mg via INTRAVENOUS
  Filled 2017-03-11: qty 40

## 2017-03-11 MED ORDER — TRAZODONE HCL 50 MG PO TABS
50.0000 mg | ORAL_TABLET | Freq: Every day | ORAL | Status: DC
  Administered 2017-03-11 – 2017-03-15 (×5): 50 mg via ORAL
  Filled 2017-03-11 (×5): qty 1

## 2017-03-11 NOTE — ED Provider Notes (Signed)
[facility] DEPT Provider Note   CSN: [id number] Arrival date & time: 03/10/17  1802     History   Chief Complaint Chief Complaint  Patient presents with  . Diarrhea    HPI Samantha Conrad is a 76 y.o. female.  Patient presentsbdominal pain, generalized weakness and diarrhea onset 3 days ago. She reports diffuse abdominal pain that comes and goes and is intermittent. Patient recently switched from peritoneal dialysis to hemodialysis 2 weeks ago. Last hemodialysis session was on October 11. She does make some urine. She complains of nausea without vomiting. She's had multiple episodes of diarrhea that has been dark. She denies take any blood thinners. She endorses feeling lightheaded and dizzy and generally weak. No vomiting. No chest pain or shortness of breath. Reports having a colonoscopy many years ago that was normal. Has required a blood transfusion remotely in the past. Hemoglobin in triage is 7 g down from last month. Patient states she also has noticed blood in her peritoneal dialysis tubing.   The history is provided by the patient.    Past Medical History:  Diagnosis Date  . Allergic rhinitis   . Cancer ([place])    skin cancer - has had removed (basal cell carcinoma on nose/face)  . Constipation   . Depression   . Diabetes mellitus without complication ([place])    diet controlled now.   . Diarrhea due to drug   . Environmental and seasonal allergies   . ESRD (end stage renal disease) ([place])    Dialysis T/Th/Sa  . Fatty liver   . Headache    none since Stroke in Dec. 2017  . Hypertension   . Hypothyroidism   . NASH (nonalcoholic steatohepatitis)   . Seizures ([place])    ? when she may have had a stroke sometime in the spring of 2018  . Sleep apnea    diagnosed 6-7 years ago "but no longer a problem since weight loss"  . Stroke [facility]) 04/2016   memory issues, lost 1/2 vision in both eyes  . Thrombocytopenia ([place])   . Thyroid disease     Patient Active Problem  List   Diagnosis Date Noted  . Laryngopharyngeal reflux (LPR) 02/17/2015  . H/O Mild allergic rhinitis 02/17/2015  . Cirrhosis ([place]) 02/17/2015    Past Surgical History:  Procedure Laterality Date  . ABDOMINAL HYSTERECTOMY    . AV FISTULA PLACEMENT Right 10/30/2016   Procedure: INSERTION OF ARTERIOVENOUS (AV) GORE-TEX GRAFT RIGHT UPPER ARM;  Surgeon: Elam Dutch, MD;  Location: [facility];  Service: Vascular;  Laterality: Right;  . CHOLECYSTECTOMY    . corneal implant bilateral Bilateral   . ROTATOR CUFF REPAIR Left   . THROMBECTOMY AND REVISION OF ARTERIOVENTOUS (AV) GORETEX  GRAFT Right 12/04/2016   Procedure: THROMBECTOMY/ REVISION OF RIGHT UPPER ARM ARTERIOVENOUS GORETEX GRAFT;  Surgeon: Elam Dutch, MD;  Location: [facility];  Service: Vascular;  Laterality: Right;  . TONSILLECTOMY      OB History    No data available       Home Medications    Prior to Admission medications   Medication Sig Start Date End Date Taking? Authorizing Provider  acetaminophen (TYLENOL) 325 MG tablet Take 650 mg by mouth every 6 (six) hours as needed for mild pain.    [provider]  acetaminophen-codeine (TYLENOL #3) 300-30 MG tablet Take 1-2 tablets by mouth every 6 (six) hours as needed for moderate pain or severe pain. 12/04/16   Alvia Grove, PA-C  amLODipine (NORVASC) 2.5 MG tablet Take 2.5 mg by mouth daily with breakfast.     [provider]  aspirin EC 81 MG tablet Take 81 mg by mouth daily with breakfast.     [provider]  atorvastatin (LIPITOR) 20 MG tablet Take 20 mg by mouth at bedtime.     [provider]  AURYXIA 1 GM 210 MG(Fe) tablet Take 2 tablets by mouth 3 (three) times daily with meals. 11/28/16   [provider]  bisacodyl (DULCOLAX) 10 MG suppository Place 10 mg rectally daily as needed (for constipation.).     [provider]  levETIRAcetam (KEPPRA) 250 MG tablet Take 250 mg by mouth every 12 (twelve) hours. With  breakfast & with supper.    [provider]  levothyroxine (SYNTHROID, LEVOTHROID) 100 MCG tablet Take 100 mcg by mouth daily before breakfast.  09/02/16   [provider]  loperamide (IMODIUM) 2 MG capsule Take 2 mg by mouth 4 (four) times daily as needed for diarrhea or loose stools.     [provider]  Multiple Vitamin (MULTIVITAMIN) capsule Take 1 capsule by mouth daily with breakfast.     [provider]  nitroGLYCERIN (NITROSTAT) 0.4 MG SL tablet Place 0.4 mg under the tongue every 5 (five) minutes as needed for chest pain.    [provider]  Pancrelipase, Lip-Prot-Amyl, (CREON) 24000-76000 units CPEP Take 2 capsules by mouth 3 (three) times daily with meals.     [provider]  polyethylene glycol (MIRALAX / GLYCOLAX) packet Take 17 g by mouth daily as needed for mild constipation (for constipation).     [provider]  prednisoLONE acetate (PRED FORTE) 1 % ophthalmic suspension Place 1 drop into both eyes at bedtime.     [provider]  traZODone (DESYREL) 50 MG tablet Take 50 mg by mouth at bedtime.    [provider]    Family History No family history on file.  Social History Social History  Substance Use Topics  . Smoking status: Never Smoker  . Smokeless tobacco: Never Used  . Alcohol use No     Allergies   Contrast media [iodinated diagnostic agents]   Review of Systems Review of Systems  Constitutional: Negative for activity change, appetite change and fever.  HENT: Negative for congestion and rhinorrhea.   Eyes: Negative for visual disturbance.  Respiratory: Negative for cough, chest tightness and shortness of breath.   Cardiovascular: Negative for chest pain.  Gastrointestinal: Positive for abdominal pain, blood in stool, diarrhea and nausea.  Genitourinary: Negative for dysuria and hematuria.  Neurological: Positive for dizziness, weakness and light-headedness.    all other  systems are negative except as noted in the HPI and PMH.    Physical Exam Updated Vital Signs BP (!) 116/41 (BP Location: Right Arm)   Pulse 71   Temp 98 F (36.7 C) (Oral)   Resp 14   Ht 5' 2"  (1.575 m)   Wt 49.9 kg (110 lb)   SpO2 100%   BMI 20.12 kg/m   Physical Exam  Constitutional: She is oriented to person, place, and time. She appears well-developed and well-nourished. No distress.  HENT:  Head: Normocephalic and atraumatic.  Mouth/Throat: Oropharynx is clear and moist. No oropharyngeal exudate.  Pale appearing/ Conjunctival pallor.  Eyes: Pupils are equal, round, and reactive to light. EOM are normal.  Neck: Normal range of motion. Neck supple.  No meningismus.  Cardiovascular: Normal rate, regular rhythm, normal heart sounds  and intact distal pulses.   No murmur heard. Pulmonary/Chest: Effort normal and breath sounds normal. No respiratory distress. She exhibits no tenderness.  Abdominal: Soft. There is tenderness. There is no rebound and no guarding.  Mild diffuse tenderness Blood in peritoneal dialysis tubing  Musculoskeletal: Normal range of motion. She exhibits no edema or tenderness.  Neurological: She is alert and oriented to person, place, and time. No cranial nerve deficit. She exhibits normal muscle tone. Coordination normal.   5/5 strength throughout. CN 2-12 intact.Equal grip strength.   Skin: Skin is warm. Capillary refill takes less than 2 seconds.  Psychiatric: She has a normal mood and affect. Her behavior is normal.  Nursing note and vitals reviewed.    ED Treatments / Results  Labs (all labs ordered are listed, but only abnormal results are displayed) Labs Reviewed  COMPREHENSIVE METABOLIC PANEL - Abnormal; Notable for the following:       Result Value   Sodium 134 (*)    Potassium 2.9 (*)    Chloride 96 (*)    Glucose, Bld 205 (*)    BUN 46 (*)    Creatinine, Ser 4.68 (*)    Calcium 8.2 (*)    Total Protein 5.8 (*)    Albumin 3.0 (*)      GFR calc non Af Amer 8 (*)    GFR calc Af Amer 9 (*)    All other components within normal limits  CBC - Abnormal; Notable for the following:    RBC 2.57 (*)    Hemoglobin 7.7 (*)    HCT 24.3 (*)    Platelets 95 (*)    All other components within normal limits  HEMOGLOBIN AND HEMATOCRIT, BLOOD - Abnormal; Notable for the following:    Hemoglobin 7.6 (*)    HCT 24.0 (*)    All other components within normal limits  FERRITIN - Abnormal; Notable for the following:    Ferritin 1,058 (*)    All other components within normal limits  RETICULOCYTES - Abnormal; Notable for the following:    Retic Ct Pct 5.6 (*)    RBC. 2.57 (*)    All other components within normal limits  BODY FLUID CULTURE  GRAM STAIN  VITAMIN B12  FOLATE  IRON AND TIBC  PROTIME-INR  LIPASE, BLOOD  BODY FLUID CELL COUNT WITH DIFFERENTIAL  URINALYSIS, ROUTINE W REFLEX MICROSCOPIC  CBC WITH DIFFERENTIAL/PLATELET  RENAL FUNCTION PANEL  HEMOGLOBIN A1C  POC OCCULT BLOOD, ED  SAMPLE TO BLOOD BANK  TYPE AND SCREEN  ABO/RH  PREPARE RBC (CROSSMATCH)    EKG  EKG Interpretation None       Radiology Ct Abdomen Pelvis Wo Contrast  Result Date: 03/11/2017 CLINICAL DATA:  Generalized abdominal pain. EXAM: CT ABDOMEN AND PELVIS WITHOUT CONTRAST TECHNIQUE: Multidetector CT imaging of the abdomen and pelvis was performed following the standard protocol without IV contrast. COMPARISON:  01/24/2011 FINDINGS: Lower chest: Mild linear scarring or atelectasis in the bases. No consolidation. No effusion. Hepatobiliary: No focal liver abnormality is seen. Status post cholecystectomy. No biliary dilatation. Pancreas: Diffuse parenchymal pancreatic calcifications consistent with chronic pancreatitis. No acute inflammation. No duct dilatation. Spleen: Normal in size without focal abnormality. Adrenals/Urinary Tract: Both adrenals are normal. Mildly atrophic kidneys. No suspicious renal parenchymal lesions. Urinary bladder is  unremarkable. Stomach/Bowel: Stomach is within normal limits. Appendix appears normal. Enteric contrast has reached the cecum. No inflammation or obstruction of bowel. Large volume colonic stool without evidence of colonic obstruction. Vascular/Lymphatic: The abdominal  aorta is normal in caliber with extensive atherosclerotic calcification. No adenopathy in the abdomen or pelvis. Reproductive: Status post hysterectomy. No adnexal masses. Other: Peritoneal catheter noted.  No ascites. Musculoskeletal: No significant skeletal lesion. IMPRESSION: 1. No acute findings are evident in the abdomen or pelvis. 2. Pancreatic calcifications consistent with chronic pancreatitis. 3. Aortic atherosclerosis. 4. Large volume colonic stool. No evidence of obstruction or inflammation of bowel. Electronically Signed   By: Andreas Newport M.D.   On: 03/11/2017 03:39   Dg Chest 2 View  Result Date: 03/11/2017 CLINICAL DATA:  Initial evaluation for acute abdominal pain, dialysis. EXAM: CHEST  2 VIEW COMPARISON:  Prior radiograph from 09/28/2016. FINDINGS: Transverse heart size within normal limits. Mediastinal silhouette normal. Aortic atherosclerosis. Lungs mildly hypoinflated. Blunting of the costophrenic angles, similar to previous, which may reflect tiny effusions versus chronic pleural reaction/ scarring. No focal infiltrates. No pulmonary edema. No pneumothorax. No acute osseus abnormality. Sequelae of prior rotator cuff repair present at the left humeral head. IMPRESSION: 1. Mild blunting of the costophrenic angles, which may reflect tiny pleural effusions versus chronic pleural reaction/scarring. 2. No other acute cardiopulmonary abnormality. 3. Aortic atherosclerosis. Electronically Signed   By: Jeannine Boga M.D.   On: 03/11/2017 05:18    Procedures Procedures (including critical care time)  Medications Ordered in ED Medications - No data to display   Initial Impression / Assessment and Plan / ED Course    I have reviewed the triage vital signs and the nursing notes.  Pertinent labs & imaging results that were available during my care of the patient were reviewed by me and considered in my medical decision making (see chart for details).    Patient with history of ESRD presenting with abdominal pain, weakness and diarrhea. Concern for GI bleed. Hemoglobin has dropped 7 g in the past one month. Patient's hemodynamics stable. No gross blood on rectal exam.  FOBT negative.   Patient agrees to blood transfusion. We'll give one unit at this time to prevent volume overload. We'll also obtain CT scan of abdomen and study dialysis fluid to evaluate for SBP.  CT negative for acute pathology. Large stool burden.  Peritoneal fluid d/w nursing and apparently no nurse is available who is properly trained to collect PD fluid. Rocephin started for possible SBP.  Plan admission for symptomatic anemia and ongoing abdominal pain. D/w Dr Tamala Julian.  CRITICAL CARE Performed by: Ezequiel Essex Total critical care time: 45 minutes Critical care time was exclusive of separately billable procedures and treating other patients. Critical care was necessary to treat or prevent imminent or life-threatening deterioration. Critical care was time spent personally by me on the following activities: development of treatment plan with patient and/or surrogate as well as nursing, discussions with consultants, evaluation of patient's response to treatment, examination of patient, obtaining history from patient or surrogate, ordering and performing treatments and interventions, ordering and review of laboratory studies, ordering and review of radiographic studies, pulse oximetry and re-evaluation of patient's condition.   Final Clinical Impressions(s) / ED Diagnoses   Final diagnoses:  Anemia, unspecified type  ESRD (end stage renal disease) ([place])  Abdominal pain, unspecified abdominal location    New Prescriptions New  Prescriptions   No medications on file     Ezequiel Essex, MD 03/11/17 [phone]

## 2017-03-11 NOTE — H&P (Signed)
History and Physical    Samantha Conrad RDE:[id number] DOB: 10-29-1940 DOA: 03/10/2017   PCP: System, Pcp Not In   Attending physician: Daleen Bo  Patient coming from/Resides with: Private residence  Chief Complaint: Abdominal pain  HPI: Samantha Conrad is a 76 y.o. female with medical history significant for chronic kidney disease on dialysis (in the past 6 months has alternated between HD PD and back to HD), diet-controlled diabetes, on a calcific pancreatitis, Samantha Conrad, seizure disorder and context of history of CVA, anemia on iron, hypothyroidism, chronic constipation and dyslipidemia. She has been experiencing waxing and waning abdominal pain over the past several days. It has been greater than 2 weeks since she has been on peritoneal dialysis. She developed very severe umbilical level/belt-like, colicky abdominal pain last night prompting her to present to the ER. She had been feeling very poorly during the day on Saturday and therefore did not go to hemodialysis as scheduled. She has chronically dark stools which were heme-negative in the ER. She has not had any fevers although did report chills yesterday. Her last colonoscopy was in 2006 but she cannot remember if this was done in [place] or name of the provider. She has utilized Miralax in the past for constipation symptoms but stopped taking it regularly because of excessive soft/loose stools. In the ER she was found to have a new significant anemia with hemoglobin now down to 7.7 from a baseline of 11.2. She was given 1 unit of packed red blood cells in the ER. She also has mild thrombocytopenia (95,000).  ED Course:  Vital Signs: BP (!) 100/47   Pulse (!) 57   Temp 98.3 F (36.8 C) (Oral)   Resp 13   Ht 5' 2"  (1.575 m)   Wt 49.9 kg (110 lb)   SpO2 99%   BMI 20.12 kg/m  CT abdomen and pelvis without contrast: Pancreatic calcifications with chronic pancreatitis, large-volume colonic stool without obstruction or inflammation 2  view CXR: Mild blunting of costophrenic angles otherwise negative Lab data: Sodium 134, potassium 2.9, chloride 96, CO2 25, glucose 205, BUN 46, creatinine 4.68, calcium 8.2, anion gap 13, albumin 3, LFTs normal, iron 48, U IBC 203, TIBC 251, saturation 19%, ferritin 1058, folate 27, B12 696, white count 6400 differential not obtained, hemoglobin 7.7, platelets 95,000, RBC 2.57 with reticulocyte count percentage 5.6 and reticulocyte count 143.9, coags normal, FOB negative Medications and treatments: Protonix 40 mg IV 1, Rocephin 1 g IV 1, fentanyl 50 g IV 1, Zofran 4 mg IV 1  Review of Systems:  In addition to the HPI above,  No Fever, myalgias or other constitutional symptoms No Headache, changes with Vision or hearing, new weakness, tingling, numbness in any extremity, dizziness, dysarthria or word finding difficulty, gait disturbance or imbalance, tremors or seizure activity No problems swallowing food or Liquids, indigestion/reflux, choking or coughing while eating, abdominal pain with or after eating No Chest pain, Cough or Shortness of Breath, palpitations, orthopnea or DOE No N/V, melena,hematochezia, dark tarry stools No dysuria, malodorous urine, hematuria or flank pain No new skin rashes, lesions, masses or bruises, No new joint pains, aches, swelling or redness No recent unintentional weight gain or loss No polyuria, polydypsia or polyphagia   Past Medical History:  Diagnosis Date  . Allergic rhinitis   . Cancer ([place])    skin cancer - has had removed (basal cell carcinoma on nose/face)  . Constipation   . Depression   . Diabetes mellitus without complication ([place])  diet controlled now.   . Diarrhea due to drug   . Environmental and seasonal allergies   . ESRD (end stage renal disease) ([place])    Dialysis T/Th/Sa  . Fatty liver   . Headache    none since Stroke in Dec. 2017  . Hypertension   . Hypothyroidism   . NASH (nonalcoholic steatohepatitis)   . Seizures  ([place])    ? when she may have had a stroke sometime in the spring of 2018  . Sleep apnea    diagnosed 6-7 years ago "but no longer a problem since weight loss"  . Stroke [facility]) 04/2016   memory issues, lost 1/2 vision in both eyes  . Thrombocytopenia ([place])   . Thyroid disease     Past Surgical History:  Procedure Laterality Date  . ABDOMINAL HYSTERECTOMY    . AV FISTULA PLACEMENT Right 10/30/2016   Procedure: INSERTION OF ARTERIOVENOUS (AV) GORE-TEX GRAFT RIGHT UPPER ARM;  Surgeon: Elam Dutch, MD;  Location: [place];  Service: Vascular;  Laterality: Right;  . CHOLECYSTECTOMY    . corneal implant bilateral Bilateral   . ROTATOR CUFF REPAIR Left   . THROMBECTOMY AND REVISION OF ARTERIOVENTOUS (AV) GORETEX  GRAFT Right 12/04/2016   Procedure: THROMBECTOMY/ REVISION OF RIGHT UPPER ARM ARTERIOVENOUS GORETEX GRAFT;  Surgeon: Elam Dutch, MD;  Location: [place];  Service: Vascular;  Laterality: Right;  . TONSILLECTOMY      Social History   Social History  . Marital status: Married    Spouse name: N/A  . Number of children: N/A  . Years of education: N/A   Occupational History  . Not on file.   Social History Main Topics  . Smoking status: Never Smoker  . Smokeless tobacco: Never Used  . Alcohol use No  . Drug use: No  . Sexual activity: Not on file   Other Topics Concern  . Not on file   Social History Narrative  . No narrative on file    Mobility: Independent Work history: Not obtained   Allergies  Allergen Reactions  . Contrast Media [Iodinated Diagnostic Agents] Swelling    SWELLING REACTION UNSPECIFIED    Family history reviewed and no history of colon cancer or inflammatory bowel disease  Prior to Admission medications   Medication Sig Start Date End Date Taking? Authorizing Provider  acetaminophen (TYLENOL) 325 MG tablet Take 650 mg by mouth every 6 (six) hours as needed for mild pain.   Yes [provider]  acetaminophen-codeine (TYLENOL #3)  300-30 MG tablet Take 1-2 tablets by mouth every 6 (six) hours as needed for moderate pain or severe pain. 12/04/16  Yes Virgina Jock A, PA-C  amLODipine (NORVASC) 2.5 MG tablet Take 2.5 mg by mouth daily with breakfast.    Yes [provider]  aspirin EC 81 MG tablet Take 81 mg by mouth daily with breakfast.    Yes [provider]  atorvastatin (LIPITOR) 20 MG tablet Take 20 mg by mouth at bedtime.     [provider]  AURYXIA 1 GM 210 MG(Fe) tablet Take 2 tablets by mouth 3 (three) times daily with meals. 11/28/16   [provider]  bisacodyl (DULCOLAX) 10 MG suppository Place 10 mg rectally daily as needed (for constipation.).     [provider]  levETIRAcetam (KEPPRA) 250 MG tablet Take 250 mg by mouth every 12 (twelve) hours. With breakfast & with supper.    [provider]  levothyroxine (SYNTHROID, LEVOTHROID) 100 MCG  tablet Take 100 mcg by mouth daily before breakfast.  09/02/16   [provider]  loperamide (IMODIUM) 2 MG capsule Take 2 mg by mouth 4 (four) times daily as needed for diarrhea or loose stools.     [provider]  Multiple Vitamin (MULTIVITAMIN) capsule Take 1 capsule by mouth daily with breakfast.     [provider]  nitroGLYCERIN (NITROSTAT) 0.4 MG SL tablet Place 0.4 mg under the tongue every 5 (five) minutes as needed for chest pain.    [provider]  Pancrelipase, Lip-Prot-Amyl, (CREON) 24000-76000 units CPEP Take 2 capsules by mouth 3 (three) times daily with meals.     [provider]  polyethylene glycol (MIRALAX / GLYCOLAX) packet Take 17 g by mouth daily as needed for mild constipation (for constipation).     [provider]  prednisoLONE acetate (PRED FORTE) 1 % ophthalmic suspension Place 1 drop into both eyes at bedtime.     [provider]  traZODone (DESYREL) 50 MG tablet Take 50 mg by mouth at bedtime.    [provider]    Physical  Exam: Vitals:   03/11/17 0400 03/11/17 0600 03/11/17 0700 03/11/17 0737  BP: (!) 111/47 (!) 95/43 (!) 91/43 (!) 100/47  Pulse: 68 (!) 56 (!) 55 (!) 57  Resp: 10 13 14 13   Temp:      TempSrc:      SpO2: 98% 99% 98% 99%  Weight:      Height:          Constitutional: NAD, calm, comfortable Eyes: PERRL, lids normal,Conjunctiva pale bilaterally ENMT: Mucous membranes are moist. Posterior pharynx clear of any exudate or lesions.Normal dentition.  Neck: normal, supple, no masses, no thyromegaly Respiratory: clear to auscultation bilaterally, no wheezing, no crackles. Normal respiratory effort. No accessory muscle use.  Cardiovascular: Regular rate and rhythm, no murmurs / rubs / gallops. No extremity edema. 2+ pedal pulses. No carotid bruits.  Abdomen: Mild mid abdominal tenderness outpatient without guarding or rebounding, no masses palpated. No hepatosplenomegaly. Bowel sounds positive.  Musculoskeletal: no clubbing / cyanosis. No joint deformity upper and lower extremities. Good ROM, no contractures. Normal muscle tone.  Skin: no rashes, lesions, ulcers. No induration Neurologic: CN 2-12 grossly intact. Sensation intact, DTR normal. Strength 5/5 x all 4 extremities.  Psychiatric: Normal judgment and insight. Alert and oriented x 3 although reports difficulty with short-term memory. Normal mood.    Labs on Admission: I have personally reviewed following labs and imaging studies  CBC:  Recent Labs Lab 03/10/17 1848 03/10/17 2306  WBC 6.4  --   HGB 7.7* 7.6*  HCT 24.3* 24.0*  MCV 94.6  --   PLT 95*  --    Basic Metabolic Panel:  Recent Labs Lab 03/10/17 1848  NA 134*  K 2.9*  CL 96*  CO2 25  GLUCOSE 205*  BUN 46*  CREATININE 4.68*  CALCIUM 8.2*   GFR: Estimated Creatinine Clearance: 7.9 mL/min (A) (by C-G formula based on SCr of 4.68 mg/dL (H)). Liver Function Tests:  Recent Labs Lab 03/10/17 1848  AST 26  ALT 24  ALKPHOS 77  BILITOT 0.5  PROT 5.8*    ALBUMIN 3.0*    Recent Labs Lab 03/10/17 1848  LIPASE 12   No results for input(s): AMMONIA in the last 168 hours. Coagulation Profile:  Recent Labs Lab 03/11/17 0055  INR 1.02   Cardiac Enzymes: No results for input(s): CKTOTAL, CKMB, CKMBINDEX, TROPONINI in the last 168 hours.  BNP (last 3 results) No results for input(s): PROBNP in the last 8760 hours. HbA1C: No results for input(s): HGBA1C in the last 72 hours. CBG: No results for input(s): GLUCAP in the last 168 hours. Lipid Profile: No results for input(s): CHOL, HDL, LDLCALC, TRIG, CHOLHDL, LDLDIRECT in the last 72 hours. Thyroid Function Tests: No results for input(s): TSH, T4TOTAL, FREET4, T3FREE, THYROIDAB in the last 72 hours. Anemia Panel:  Recent Labs  03/11/17 0055  VITAMINB12 696  FOLATE 27.0  FERRITIN 1,058*  TIBC 251  IRON 48  RETICCTPCT 5.6*   Urine analysis: No results found for: COLORURINE, APPEARANCEUR, LABSPEC, PHURINE, GLUCOSEU, HGBUR, BILIRUBINUR, KETONESUR, PROTEINUR, UROBILINOGEN, NITRITE, LEUKOCYTESUR Sepsis Labs: @LABRCNTIP (procalcitonin:4,lacticidven:4) )No results found for this or any previous visit (from the past 240 hour(s)).   Radiological Exams on Admission: Ct Abdomen Pelvis Wo Contrast  Result Date: 03/11/2017 CLINICAL DATA:  Generalized abdominal pain. EXAM: CT ABDOMEN AND PELVIS WITHOUT CONTRAST TECHNIQUE: Multidetector CT imaging of the abdomen and pelvis was performed following the standard protocol without IV contrast. COMPARISON:  01/24/2011 FINDINGS: Lower chest: Mild linear scarring or atelectasis in the bases. No consolidation. No effusion. Hepatobiliary: No focal liver abnormality is seen. Status post cholecystectomy. No biliary dilatation. Pancreas: Diffuse parenchymal pancreatic calcifications consistent with chronic pancreatitis. No acute inflammation. No duct dilatation. Spleen: Normal in size without focal abnormality. Adrenals/Urinary Tract: Both adrenals are  normal. Mildly atrophic kidneys. No suspicious renal parenchymal lesions. Urinary bladder is unremarkable. Stomach/Bowel: Stomach is within normal limits. Appendix appears normal. Enteric contrast has reached the cecum. No inflammation or obstruction of bowel. Large volume colonic stool without evidence of colonic obstruction. Vascular/Lymphatic: The abdominal aorta is normal in caliber with extensive atherosclerotic calcification. No adenopathy in the abdomen or pelvis. Reproductive: Status post hysterectomy. No adnexal masses. Other: Peritoneal catheter noted.  No ascites. Musculoskeletal: No significant skeletal lesion. IMPRESSION: 1. No acute findings are evident in the abdomen or pelvis. 2. Pancreatic calcifications consistent with chronic pancreatitis. 3. Aortic atherosclerosis. 4. Large volume colonic stool. No evidence of obstruction or inflammation of bowel. Electronically Signed   By: Andreas Newport M.D.   On: 03/11/2017 03:39   Dg Chest 2 View  Result Date: 03/11/2017 CLINICAL DATA:  Initial evaluation for acute abdominal pain, dialysis. EXAM: CHEST  2 VIEW COMPARISON:  Prior radiograph from 09/28/2016. FINDINGS: Transverse heart size within normal limits. Mediastinal silhouette normal. Aortic atherosclerosis. Lungs mildly hypoinflated. Blunting of the costophrenic angles, similar to previous, which may reflect tiny effusions versus chronic pleural reaction/ scarring. No focal infiltrates. No pulmonary edema. No pneumothorax. No acute osseus abnormality. Sequelae of prior rotator cuff repair present at the left humeral head. IMPRESSION: 1. Mild blunting of the costophrenic angles, which may reflect tiny pleural effusions versus chronic pleural reaction/scarring. 2. No other acute cardiopulmonary abnormality. 3. Aortic atherosclerosis. Electronically Signed   By: Jeannine Boga M.D.   On: 03/11/2017 05:18    Assessment/Plan Principal Problem:   Symptomatic anemia -Patient presented with  abdominal pain and was found to have worsening anemia noting hemoglobin was 11.2 in June and currently is 7.7 -On iron replacement therapy and current iron levels normal -Has received 1 unit of packed red blood cells in the ER-obtain CBC today and again in a.m. -Initial stool for occult blood negative-continue to cycle -Stools chronically dark but no bright red blood seen -Reports last colonoscopy around 2006 but cannot recall location of procedure or name of physician -In review of care everywhere patient did undergo EGD in 2016  in context of screening for esophageal varices in setting of ascites and known [place] cirrhosis-results of EGD not available electronically -Platelets are also slightly low but I do not have old labs for comparison-if hemoglobin continues to drop as well as platelets may need to consider hemolytic component and check LDH and haptoglobin -Possibly related to underlying chronic kidney disease but hemoglobin has dropped significantly in the short time period (4 months) making this less likely  Active Problems:   Abdominal pain/Constipation -Patient reports issues with constipation although has daily bowel movements -Previously has utilized MiraLAX but found for dose resulted in frequently stools-discussed adjusting dose to very small (1/4 capfull) and monitor results -CT abdomen and pelvis with incidental finding of significant stool burden: -Begin full dose MiraLAX and Colace twice a day -Consideration given to SBP although patient has not done PD for multiple weeks, has no fevers or leukocytosis-orders placed in ER to obtain PD fluid for culture and has been given a dose of Rocephin-will continue for now    Diabetes mellitus 2  -Mildly hyperglycemic in ER -Follow CBGs and provide SSI -HgbA1c -Continue Tradjenta    Hypertension -Current blood pressure somewhat suboptimal in setting of GI symptoms, constipation and volume depletion -Hold preadmission Norvasc and  Cozaar    CKD (chronic kidney disease) stage V requiring chronic dialysis  -Reports has transitioned from hemodialysis to PD and back to hemodialysis -TTS-missed Saturday due to above symptoms -Patient reports significant pain with needle placement and peripheral access and is considering returning to PD -Nephrology consulted -Potassium is actually low so we'll give replacement given GI symptoms, no volume overload so no indication for urgent dialysis at this juncture    Chronic pancreatitis  -Continue pancreatic enzymes -No prandial or postprandial symptoms -Lipase normal    NASH (nonalcoholic steatohepatitis) -No ascites and LFTs currently normal    Hypothyroidism -Continue Synthroid -TSH especially in context of severe constipation and currently unexplained heme-negative anemia    Seizure disorder/history of CVA -Continue Keppra -Continue baby aspirin    HLD (hyperlipidemia) -Not on statin prior to admission      DVT prophylaxis: SCDs Code Status: Full Family Communication: Daughter Disposition Plan: Home Consults called: Nephrology/[place]     Samantha Conrad ANP-BC [facility] Pager [phone]   If 7PM-7AM, please contact night-coverage [email or web address] Password [email or web address]  03/11/2017, 9:16 AM

## 2017-03-11 NOTE — ED Notes (Signed)
Admitting at bedside 

## 2017-03-11 NOTE — Plan of Care (Signed)
Ms. Cousineau os a 76 y/o f with pmh of ESRD on HD; who presented for abd/d/weakness for last 3 days. Diarrhea dark,but stool guaic neg. Hbg dropped 11.2-14 range down to 7.7. Ordered to be transfused 1 unit of PRBCs.

## 2017-03-11 NOTE — ED Notes (Signed)
Attempted report 

## 2017-03-11 NOTE — Consult Note (Addendum)
[facility] Renal Consultation Note    Indication for Consultation:  Management of ESRD/hemodialysis; anemia, hypertension/volume and secondary hyperparathyroidism  ERX:[id number], Pcp Not In  HPI: Samantha Conrad is a 76 y.o. female. 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 Her last HD was 10/11, she ran her full treatment, without complications, but did not reach her EDW.    Patient was admitted for symptomatic anemia. Reports abdominal pain x3 days, with constipation, followed by diarrhea. Patient missed HD yesterday due to diarrhea, she call the center and they informed her of her low Hgb (7.7) and instructed her to go to ED, and she told them she would if she started to feel worse. Family reports receiving a call from [facility] last night with the same information and they brought her in.  In ED pertinant findings include Hgb 7.6, negative FOBT, negative CT.  Patient was given 1Unit pRBC in ED.  Of note patient started on HD then transitioned to PD, she has intermittently transitioned back and forth for a variety of reasons.  Most recently she started back on HD on 9/26.  She has been unable to flush her PD catheter during that time because she can not remember how. Patient is thinking of going back to PD because she can not tolerate the pain of cannulation, previously they used a TDC and this last 2 weeks is the only time the AVG has been used.  Over the last 6 weeks patients Hgb has been trending down as outpatient from 11.2 to 7.7.  ESA was administered on 10/9 and stool cards given but not returned.     Past Medical History:  Diagnosis Date  . Allergic rhinitis   . Cancer ([place])    skin cancer - has had removed (basal cell carcinoma on nose/face)  . Constipation   . Depression    . Diabetes mellitus without complication ([place])    diet controlled now.   . Diarrhea due to drug   . Environmental and seasonal allergies   . ESRD (end stage renal disease) ([place])    Dialysis T/Th/Sa  . Fatty liver   . Headache    none since Stroke in Dec. 2017  . Hypertension   . Hypothyroidism   . NASH (nonalcoholic steatohepatitis)   . Seizures ([place])    ? when she may have had a stroke sometime in the spring of 2018  . Sleep apnea    diagnosed 6-7 years ago "but no longer a problem since weight loss"  . Stroke [facility]) 04/2016   memory issues, lost 1/2 vision in both eyes  . Thrombocytopenia ([place])   . Thyroid disease    Past Surgical History:  Procedure Laterality Date  . ABDOMINAL HYSTERECTOMY    . AV FISTULA PLACEMENT Right 10/30/2016   Procedure: INSERTION OF ARTERIOVENOUS (AV) GORE-TEX GRAFT RIGHT UPPER ARM;  Surgeon: Elam Dutch, MD;  Location: [place];  Service: Vascular;  Laterality: Right;  . CHOLECYSTECTOMY    . corneal implant bilateral Bilateral   . ROTATOR CUFF REPAIR Left   . THROMBECTOMY AND REVISION OF ARTERIOVENTOUS (AV) GORETEX  GRAFT Right 12/04/2016   Procedure: THROMBECTOMY/ REVISION OF RIGHT UPPER ARM ARTERIOVENOUS GORETEX GRAFT;  Surgeon: Elam Dutch, MD;  Location: [place];  Service: Vascular;  Laterality: Right;  . TONSILLECTOMY     No  family history on file. Social History:  reports that she has never smoked. She has never used smokeless tobacco. She reports that she does not drink alcohol or use drugs. Allergies  Allergen Reactions  . Contrast Media [Iodinated Diagnostic Agents] Swelling    SWELLING REACTION UNSPECIFIED    Prior to Admission medications   Medication Sig Start Date End Date Taking? Authorizing Provider  acetaminophen (TYLENOL) 325 MG tablet Take 650 mg by mouth every 6 (six) hours as needed for mild pain.   Yes [provider]  acetaminophen-codeine (TYLENOL #3) 300-30 MG tablet Take 1-2 tablets by mouth every 6 (six)  hours as needed for moderate pain or severe pain. 12/04/16  Yes Virgina Jock A, PA-C  amLODipine (NORVASC) 10 MG tablet Take 10 mg by mouth daily with breakfast.    Yes [provider]  aspirin EC 81 MG tablet Take 81 mg by mouth daily with breakfast.    Yes [provider]  AURYXIA 1 GM 210 MG(Fe) tablet Take 2 tablets by mouth 3 (three) times daily with meals. 11/28/16  Yes [provider]  bisacodyl (DULCOLAX) 10 MG suppository Place 10 mg rectally daily as needed (for constipation.).    Yes [provider]  levETIRAcetam (KEPPRA) 250 MG tablet Take 250 mg by mouth every 12 (twelve) hours. With breakfast & with supper.   Yes [provider]  levothyroxine (SYNTHROID, LEVOTHROID) 100 MCG tablet Take 100 mcg by mouth daily before breakfast.  09/02/16  Yes [provider]  linagliptin (TRADJENTA) 5 MG TABS tablet Take 5 mg by mouth daily.   Yes [provider]  loperamide (IMODIUM) 2 MG capsule Take 2 mg by mouth 4 (four) times daily as needed for diarrhea or loose stools.    Yes [provider]  losartan (COZAAR) 25 MG tablet Take 25 mg by mouth daily.   Yes [provider]  Multiple Vitamin (MULTIVITAMIN) capsule Take 1 capsule by mouth daily with breakfast.    Yes [provider]  nitroGLYCERIN (NITROSTAT) 0.4 MG SL tablet Place 0.4 mg under the tongue every 5 (five) minutes as needed for chest pain.   Yes [provider]  Pancrelipase, Lip-Prot-Amyl, (CREON) 24000-76000 units CPEP Take 2 capsules by mouth 3 (three) times daily with meals.    Yes [provider]  polyethylene glycol (MIRALAX / GLYCOLAX) packet Take 17 g by mouth daily as needed for mild constipation (for constipation).    Yes [provider]  prednisoLONE acetate (PRED FORTE) 1 % ophthalmic suspension Place 1 drop into both eyes at bedtime.    Yes [provider]  traZODone (DESYREL) 50 MG tablet Take 50 mg by  mouth at bedtime.   Yes [provider]   Current Facility-Administered Medications  Medication Dose Route Frequency Provider Last Rate Last Dose  . docusate sodium (COLACE) capsule 100 mg  100 mg Oral BID Samella Parr, NP   100 mg at 03/11/17 1052  . insulin aspart (novoLOG) injection 0-5 Units  0-5 Units Subcutaneous QHS Samella Parr, NP      . insulin aspart (novoLOG) injection 0-9 Units  0-9 Units Subcutaneous TID WC Samella Parr, NP      . linagliptin (TRADJENTA) tablet 5 mg  5 mg Oral Daily Erin Hearing L, NP      . morphine 4 MG/ML injection 1-2 mg  1-2 mg Intravenous Q2H PRN Samella Parr, NP      . pantoprazole (PROTONIX) injection 40  mg  40 mg Intravenous Q12H Erin Hearing L, NP      . polyethylene glycol (MIRALAX / GLYCOLAX) packet 17 g  17 g Oral Daily Erin Hearing L, NP   17 g at 03/11/17 1053  . potassium chloride SA (K-DUR,KLOR-CON) CR tablet 40 mEq  40 mEq Oral STAT Norval Morton, MD       Current Outpatient Prescriptions  Medication Sig Dispense Refill  . acetaminophen (TYLENOL) 325 MG tablet Take 650 mg by mouth every 6 (six) hours as needed for mild pain.    Marland Kitchen acetaminophen-codeine (TYLENOL #3) 300-30 MG tablet Take 1-2 tablets by mouth every 6 (six) hours as needed for moderate pain or severe pain. 15 tablet 0  . amLODipine (NORVASC) 10 MG tablet Take 10 mg by mouth daily with breakfast.     . aspirin EC 81 MG tablet Take 81 mg by mouth daily with breakfast.     . AURYXIA 1 GM 210 MG(Fe) tablet Take 2 tablets by mouth 3 (three) times daily with meals.  1  . bisacodyl (DULCOLAX) 10 MG suppository Place 10 mg rectally daily as needed (for constipation.).     Marland Kitchen levETIRAcetam (KEPPRA) 250 MG tablet Take 250 mg by mouth every 12 (twelve) hours. With breakfast & with supper.    . levothyroxine (SYNTHROID, LEVOTHROID) 100 MCG tablet Take 100 mcg by mouth daily before breakfast.   0  . linagliptin (TRADJENTA) 5 MG TABS tablet Take 5 mg by mouth  daily.    Marland Kitchen loperamide (IMODIUM) 2 MG capsule Take 2 mg by mouth 4 (four) times daily as needed for diarrhea or loose stools.     Marland Kitchen losartan (COZAAR) 25 MG tablet Take 25 mg by mouth daily.    . Multiple Vitamin (MULTIVITAMIN) capsule Take 1 capsule by mouth daily with breakfast.     . nitroGLYCERIN (NITROSTAT) 0.4 MG SL tablet Place 0.4 mg under the tongue every 5 (five) minutes as needed for chest pain.    . Pancrelipase, Lip-Prot-Amyl, (CREON) 24000-76000 units CPEP Take 2 capsules by mouth 3 (three) times daily with meals.     . polyethylene glycol (MIRALAX / GLYCOLAX) packet Take 17 g by mouth daily as needed for mild constipation (for constipation).     . prednisoLONE acetate (PRED FORTE) 1 % ophthalmic suspension Place 1 drop into both eyes at bedtime.     . traZODone (DESYREL) 50 MG tablet Take 50 mg by mouth at bedtime.     Labs: Basic Metabolic Panel:  Recent Labs Lab 03/10/17 1848  NA 134*  K 2.9*  CL 96*  CO2 25  GLUCOSE 205*  BUN 46*  CREATININE 4.68*  CALCIUM 8.2*   Liver Function Tests:  Recent Labs Lab 03/10/17 1848  AST 26  ALT 24  ALKPHOS 77  BILITOT 0.5  PROT 5.8*  ALBUMIN 3.0*    Recent Labs Lab 03/10/17 1848  LIPASE 12   CBC:  Recent Labs Lab 03/10/17 1848 03/10/17 2306  WBC 6.4  --   HGB 7.7* 7.6*  HCT 24.3* 24.0*  MCV 94.6  --   PLT 95*  --    CBG:  Recent Labs Lab 03/11/17 1051  GLUCAP 105*   Iron Studies:  Recent Labs  03/11/17 0055  IRON 48  TIBC 251  FERRITIN 1,058*   Studies/Results: Ct Abdomen Pelvis Wo Contrast  Result Date: 03/11/2017 CLINICAL DATA:  Generalized abdominal pain. EXAM: CT ABDOMEN AND PELVIS WITHOUT CONTRAST TECHNIQUE: Multidetector CT imaging of the  abdomen and pelvis was performed following the standard protocol without IV contrast. COMPARISON:  01/24/2011 FINDINGS: Lower chest: Mild linear scarring or atelectasis in the bases. No consolidation. No effusion. Hepatobiliary: No focal liver  abnormality is seen. Status post cholecystectomy. No biliary dilatation. Pancreas: Diffuse parenchymal pancreatic calcifications consistent with chronic pancreatitis. No acute inflammation. No duct dilatation. Spleen: Normal in size without focal abnormality. Adrenals/Urinary Tract: Both adrenals are normal. Mildly atrophic kidneys. No suspicious renal parenchymal lesions. Urinary bladder is unremarkable. Stomach/Bowel: Stomach is within normal limits. Appendix appears normal. Enteric contrast has reached the cecum. No inflammation or obstruction of bowel. Large volume colonic stool without evidence of colonic obstruction. Vascular/Lymphatic: The abdominal aorta is normal in caliber with extensive atherosclerotic calcification. No adenopathy in the abdomen or pelvis. Reproductive: Status post hysterectomy. No adnexal masses. Other: Peritoneal catheter noted.  No ascites. Musculoskeletal: No significant skeletal lesion. IMPRESSION: 1. No acute findings are evident in the abdomen or pelvis. 2. Pancreatic calcifications consistent with chronic pancreatitis. 3. Aortic atherosclerosis. 4. Large volume colonic stool. No evidence of obstruction or inflammation of bowel. Electronically Signed   By: Andreas Newport M.D.   On: 03/11/2017 03:39   Dg Chest 2 View  Result Date: 03/11/2017 CLINICAL DATA:  Initial evaluation for acute abdominal pain, dialysis. EXAM: CHEST  2 VIEW COMPARISON:  Prior radiograph from 09/28/2016. FINDINGS: Transverse heart size within normal limits. Mediastinal silhouette normal. Aortic atherosclerosis. Lungs mildly hypoinflated. Blunting of the costophrenic angles, similar to previous, which may reflect tiny effusions versus chronic pleural reaction/ scarring. No focal infiltrates. No pulmonary edema. No pneumothorax. No acute osseus abnormality. Sequelae of prior rotator cuff repair present at the left humeral head. IMPRESSION: 1. Mild blunting of the costophrenic angles, which may reflect  tiny pleural effusions versus chronic pleural reaction/scarring. 2. No other acute cardiopulmonary abnormality. 3. Aortic atherosclerosis. Electronically Signed   By: Jeannine Boga M.D.   On: 03/11/2017 05:18    ROS: All others negative except those listed in HPI.   Physical Exam: Vitals:   03/11/17 1204 03/11/17 1300 03/11/17 1316 03/11/17 1400  BP: (!) 124/52 (!) 110/54 (!) 110/54 (!) 101/40  Pulse: 65 69 77 64  Resp: 13 14 (!) 23 12  Temp:      TempSrc:      SpO2: 100% 100% 100% 97%  Weight:      Height:         General: WDWN, NAD, elderly female Head: NCAT, sclera not icteric MMM Neck: Supple. No lymphadenopathy, no JVD Lungs: CTA bilaterally. No wheeze, rales or rhonchi. Breathing is unlabored. Heart: bradycardia, RR. No murmur, rubs or gallops.  Abdomen: soft, nontender, +BS, no guarding, no rebound tenderness M/S:  Equal strength b/l in upper and lower extremities.  Lower extremities:2+ edema on RLE, 3+ on LLE, no ischemic changes, or open wounds  Neuro: A&Ox3. Moves all extremities spontaneously. Psych:  Responds to questions appropriately with a normal affect. Dialysis Access:RU AVG +thrill, bruit, RUQ double cuff PD catheter -c/d/i, small amount of blood in line  Dialysis Orders:  TTS [facility] 3.5h   48kg  2/2.25   RUE AVG 3.5hrs, BFR 400, DFR autoflow 1.5,  EDW 48kg, 2K/ 2.25Ca,   CCPD: 7x wk, Ca 2.5, Mg 0.5, Dext 1.5;2.5;4.25%, #exchange 3, fill vol 2059m, dwell time 3hrs, last fill vol 0101m Day exchange:0   Access: RUQ double cuff PD catheter Heparin: 5000 Units IP PRN mircera 10020mIV q2wks -last 10/9 Venofer 100m44m x5HD - not started  Last  Labs: 9/26: K 3.0, 9/5:Ca 8.7, P 6.4, Alb 3.4, 7/24: PTH 184 Hgb: 9/5: 12.7, 9/26: 9.0, 10/4: 8.5, 10/11: 7.4  Assessment/Plan:  1. Symptomatic Anemia: Hgb 7.6 today, TSAT 19% and Ferritin 1,058.  S/p 1Unit pRBC in ED.  FOBT in ED negative.  CT negative for acute process.   ESA given on 10/9.  Start IV iron qHDx3  and Aranesp 189mg qwk on Tues with HD. 2. Abdominal pain - prob due to constipation. Getting laxatives.  No signs of peritonitis clinically, and PD cath exit site is clean. Not actively doing PD now, but says she will go back to PD after she gets home.  3. ESRD -  On HD TTS for last 2 weeks. Not tolerating well because painful cannulation. Will try numbing spray and see if it improves pain.  Would like to go back to PD if not.  K 2.9, use 4K bath. No indication for emergent/urgent dialysis. Will reassess tomorrow. Missed hemodialysis yesterday, no signs of vol overload or electrolyte issues. Will plan short HD tomorrow, min UF.  4.  Hypertension/volume  - BP variable but mostly hypotensive. CXR/CT does not indicate excess fluid.  Continue to monitor.  5.  Secondary Hyperparathyroidism - Cor Ca and P in goal. Continue binder.  6.  Nutrition - Albumin 3.0. Renal diet and Prostat.  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   LJen Mow PA-C [facility] Pager: [phone]/14/2018, 2:43 PM   Pt seen, examined and agree w A/P as above.  RKelly SplinterMD CNewell Rubbermaidpager [phone]  03/11/2017, 5:18 PM

## 2017-03-12 LAB — RENAL FUNCTION PANEL
Albumin: 2.3 g/dL — ABNORMAL LOW (ref 3.5–5.0)
Anion gap: 11 (ref 5–15)
BUN: 55 mg/dL — ABNORMAL HIGH (ref 6–20)
CHLORIDE: 106 mmol/L (ref 101–111)
CO2: 22 mmol/L (ref 22–32)
Calcium: 7.9 mg/dL — ABNORMAL LOW (ref 8.9–10.3)
Creatinine, Ser: 5.48 mg/dL — ABNORMAL HIGH (ref 0.44–1.00)
GFR, EST AFRICAN AMERICAN: 8 mL/min — AB (ref >60)
GFR, EST NON AFRICAN AMERICAN: 7 mL/min — AB (ref >60)
Glucose, Bld: 125 mg/dL — ABNORMAL HIGH (ref 65–99)
POTASSIUM: 4.1 mmol/L (ref 3.5–5.1)
Phosphorus: 5.1 mg/dL — ABNORMAL HIGH (ref 2.5–4.6)
Sodium: 139 mmol/L (ref 135–145)

## 2017-03-12 LAB — TYPE AND SCREEN
ABO/RH(D): A POS
ANTIBODY SCREEN: NEGATIVE
Unit division: 0

## 2017-03-12 LAB — CBC WITH DIFFERENTIAL/PLATELET
BASOS ABS: 0 10*3/uL (ref 0.0–0.1)
Basophils Relative: 0 %
EOS PCT: 1 %
Eosinophils Absolute: 0.1 10*3/uL (ref 0.0–0.7)
HEMATOCRIT: 25.2 % — AB (ref 36.0–46.0)
Hemoglobin: 8.2 g/dL — ABNORMAL LOW (ref 12.0–15.0)
LYMPHS ABS: 1.2 10*3/uL (ref 0.7–4.0)
LYMPHS PCT: 19 %
MCH: 30.5 pg (ref 26.0–34.0)
MCHC: 32.5 g/dL (ref 30.0–36.0)
MCV: 93.7 fL (ref 78.0–100.0)
MONO ABS: 0.8 10*3/uL (ref 0.1–1.0)
Monocytes Relative: 13 %
NEUTROS ABS: 4.3 10*3/uL (ref 1.7–7.7)
Neutrophils Relative %: 67 %
PLATELETS: 78 10*3/uL — AB (ref 150–400)
RBC: 2.69 MIL/uL — AB (ref 3.87–5.11)
RDW: 15.8 % — AB (ref 11.5–15.5)
WBC: 6.3 10*3/uL (ref 4.0–10.5)

## 2017-03-12 LAB — BPAM RBC
BLOOD PRODUCT EXPIRATION DATE: 201811052359
ISSUE DATE / TIME: 201810140138
UNIT TYPE AND RH: 6200

## 2017-03-12 LAB — GLUCOSE, CAPILLARY
Glucose-Capillary: 94 mg/dL (ref 65–99)
Glucose-Capillary: 283 mg/dL — ABNORMAL HIGH (ref 65–99)
Glucose-Capillary: 92 mg/dL (ref 65–99)

## 2017-03-12 MED ORDER — SENNOSIDES-DOCUSATE SODIUM 8.6-50 MG PO TABS
2.0000 | ORAL_TABLET | Freq: Two times a day (BID) | ORAL | Status: DC
  Administered 2017-03-12 – 2017-03-15 (×6): 2 via ORAL
  Filled 2017-03-12 (×6): qty 2

## 2017-03-12 MED ORDER — SODIUM CHLORIDE 0.9 % IV SOLN
125.0000 mg | INTRAVENOUS | Status: DC
  Administered 2017-03-13: 125 mg via INTRAVENOUS
  Filled 2017-03-12 (×3): qty 10

## 2017-03-12 MED ORDER — DARBEPOETIN ALFA 150 MCG/0.3ML IJ SOSY
150.0000 ug | PREFILLED_SYRINGE | INTRAMUSCULAR | Status: DC
  Filled 2017-03-12: qty 0.3

## 2017-03-12 MED ORDER — DARBEPOETIN ALFA 150 MCG/0.3ML IJ SOSY
150.0000 ug | PREFILLED_SYRINGE | Freq: Once | INTRAMUSCULAR | Status: DC
  Administered 2017-03-13: 150 ug via INTRAVENOUS
  Filled 2017-03-12: qty 0.3

## 2017-03-12 MED ORDER — DARBEPOETIN ALFA 150 MCG/0.3ML IJ SOSY: 150.0000 ug | PREFILLED_SYRINGE | INTRAMUSCULAR | Status: DC

## 2017-03-12 MED ORDER — NA FERRIC GLUC CPLX IN SUCROSE 12.5 MG/ML IV SOLN
125.0000 mg | Freq: Once | INTRAVENOUS | Status: AC
  Administered 2017-03-12: 125 mg via INTRAVENOUS
  Filled 2017-03-12: qty 10

## 2017-03-12 MED ORDER — IOPAMIDOL (ISOVUE-300) INJECTION 61%
INTRAVENOUS | Status: AC
  Filled 2017-03-12: qty 100

## 2017-03-12 MED ORDER — POLYETHYLENE GLYCOL 3350 17 G PO PACK
17.0000 g | PACK | Freq: Two times a day (BID) | ORAL | Status: DC
  Administered 2017-03-12 – 2017-03-15 (×5): 17 g via ORAL
  Filled 2017-03-12 (×6): qty 1

## 2017-03-12 NOTE — Progress Notes (Signed)
[facility] Progress Note  Patient: Samantha Conrad   PCP: System, Pcp Not In DOB: 09-21-1940   DOA: 03/10/2017   DOS: 03/12/2017   Date of Service: the patient was seen and examined on 03/12/2017  Subjective: tenderness to have abdominal soreness. No nausea no vomiting or passing gas. No BM this morning.  Brief hospital course: Pt. with PMH of ESRD on PD, recently switched to HD, type II DM, calcific pancreatitis, [name], seizures, CVA; admitted on 03/10/2017, presented with complaint of abdominal pain, was found to have constipation as well as volume overload. Currently further plan is continue acute hemodialysis.  Assessment and Plan:   Symptomatic anemia - Patient presented with abdominal pain and was found to have worsening anemia noting hemoglobin was 11.2 in June and currently is 7.7 - On iron replacement therapy and current iron levels normal - Has received 1 unit of packed red blood cells, repeat CBC. 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 - Possibly related to underlying chronic kidney disease  Active Problems:   Abdominal pain/Constipation -Patient reports issues with constipation although has daily bowel movements -Consideration given to SBP although patient has not done PD for multiple weeks, has no fevers or leukocytosis-orders placed in ER to obtain PD fluid for culture and has been given a dose of Rocephin-will continue for now    Diabetes mellitus 2  -Mildly hyperglycemic in ER -Follow CBGs and provide SSI -HgbA1c -Continue Tradjenta    Hypertension -Current blood pressure somewhat suboptimal in setting of GI symptoms, constipation and volume  depletion -Hold preadmission Norvasc and Cozaar    CKD (chronic kidney disease) stage V requiring chronic dialysis  -Reports has transitioned from hemodialysis to PD and back to hemodialysis -TTS-missed Saturday due to above symptoms -Patient reports significant pain with needle placement and peripheral access and is considering returning to PD -Nephrology consulted    Chronic pancreatitis  -Continue pancreatic enzymes -No prandial or postprandial symptoms -Lipase normal    NASH (nonalcoholic steatohepatitis) -No ascites and LFTs currently normal    Hypothyroidism -Continue Synthroid -TSH especially in context of severe constipation and currently unexplained heme-negative anemia    Seizure disorder/history of CVA -Continue Keppra -hold baby aspirin    HLD (hyperlipidemia) -Not on statin prior to admission  Diet: renal diet DVT Prophylaxis:mechanical compression device  Advance goals of care discussion: full code  Family Communication: no family was present at bedside, at the time of interview.  Disposition:  Discharge to home.  Consultants: nephrology Procedures: HD  Antibiotics: Anti-infectives    Start     Dose/Rate Route Frequency Ordered Stop   03/11/17 1515  cefTRIAXone (ROCEPHIN) 2 g in dextrose 5 % 50 mL IVPB     2 g 100 mL/hr over 30 Minutes Intravenous Every 24 hours 03/11/17 1514     03/11/17 0145  cefTRIAXone (ROCEPHIN) 1 g in dextrose 5 % 50 mL IVPB     1 g 100 mL/hr over 30 Minutes Intravenous  Once 03/11/17 0133 03/11/17 0501       Objective: Physical Exam: Vitals:   03/12/17 0930 03/12/17 1000 03/12/17 1022 03/12/17 1403  BP: (!) 118/48 114/65 (!) 116/49 (!) 127/44  Pulse: 61 63 63 72  Resp: 16 13 17 16   Temp:   98 F (36.7 C) 98.4 F (36.9 C)  TempSrc:   Oral Oral  SpO2:   98% 100%  Weight:      Height:        Intake/Output Summary (Last 24 hours) at 03/12/17 1924 Last data filed at 03/12/17 1800  Gross per 24 hour  Intake               290 ml  Output             1900 ml  Net            -1610 ml   Filed Weights   03/10/17 1817 03/12/17 0730  Weight: 49.9 kg (110 lb) 50 kg (110 lb 3.7 oz)   General: Alert, Awake and Oriented to Time, Place and Person. Appear in mild distress, affect appropriate Eyes: PERRL, Conjunctiva normal ENT: Oral Mucosa clear moist. Neck: no JVD, no Abnormal Mass Or lumps Cardiovascular: S1 and S2 Present, no Murmur, Peripheral Pulses Present Respiratory: normal respiratory effort, Bilateral Air entry equal and Decreased, no use of accessory muscle, Clear to Auscultation, no Crackles, no wheezes Abdomen: Bowel Sound present, Soft and no tenderness, no hernia Skin: no redness, no Rash, no induration Extremities: no Pedal edema, no calf tenderness Neurologic: Grossly no focal neuro deficit. Bilaterally Equal motor strength  Data Reviewed: CBC:  Recent Labs Lab 03/10/17 1848 03/10/17 2306 03/11/17 1504 03/12/17 0800  WBC 6.4  --  6.1 6.3  NEUTROABS  --   --  4.1 4.3  HGB 7.7* 7.6* 9.3* 8.2*  HCT 24.3* 24.0* 28.9* 25.2*  MCV 94.6  --  92.6 93.7  PLT 95*  --  88* 78*   Basic Metabolic Panel:  Recent Labs Lab 03/10/17 1848 03/11/17 1504 03/12/17 0800  NA 134* 136 139  K 2.9* 3.8 4.1  CL 96* 102 106  CO2 25 24 22   GLUCOSE 205* 222* 125*  BUN 46* 49* 55*  CREATININE 4.68* 5.19* 5.48*  CALCIUM 8.2* 7.9* 7.9*  PHOS  --  5.0* 5.1*    Liver Function Tests:  Recent Labs Lab 03/10/17 1848 03/11/17 1504 03/12/17 0800  AST 26  --   --   ALT 24  --   --   ALKPHOS 77  --   --   BILITOT 0.5  --   --   PROT 5.8*  --   --   ALBUMIN 3.0* 2.7* 2.3*    Recent Labs Lab 03/10/17 1848  LIPASE 12   No results for input(s): AMMONIA in the last 168 hours. Coagulation Profile:  Recent Labs Lab 03/11/17 0055  INR 1.02   Cardiac Enzymes: No results for input(s): CKTOTAL, CKMB, CKMBINDEX, TROPONINI in the last 168 hours. BNP (last 3 results) No results for input(s):  PROBNP in the last 8760 hours. CBG:  Recent Labs Lab 03/11/17 1051 03/11/17 1724 03/11/17 2150 03/12/17 1200 03/12/17 1638  GLUCAP 105* 180* 104* 94 283*   Studies: No results found.  Scheduled Meds: . atorvastatin  20 mg Oral QHS  . [START ON 03/13/2017] darbepoetin (ARANESP) injection - DIALYSIS  150 mcg Intravenous Q Tue-HD  . feeding supplement (NEPRO CARB STEADY)  237 mL Oral BID BM  . ferric citrate  420 mg Oral TID WC  . insulin aspart  0-5 Units Subcutaneous QHS  . insulin aspart  0-9 Units Subcutaneous TID WC  . levETIRAcetam  250 mg Oral Q12H  . levothyroxine  100  mcg Oral QAC breakfast  . linagliptin  5 mg Oral Daily  . lipase/protease/amylase  24,000 Units Oral TID WC  . multivitamin  1 tablet Oral Daily  . pantoprazole (PROTONIX) IV  40 mg Intravenous Q12H  . polyethylene glycol  17 g Oral BID  . prednisoLONE acetate  1 drop Both Eyes QHS  . senna-docusate  2 tablet Oral BID  . sodium chloride flush  3 mL Intravenous Q12H  . traZODone  50 mg Oral QHS   Continuous Infusions: . cefTRIAXone (ROCEPHIN)  IV 2 g (03/12/17 1617)  . [START ON 03/13/2017] ferric gluconate (FERRLECIT/NULECIT) IV     PRN Meds: acetaminophen **OR** acetaminophen, acetaminophen-codeine, morphine injection, ondansetron **OR** ondansetron (ZOFRAN) IV  Time spent: 35 minutes  Author: Berle Mull, MD [facility] Pager: [phone] 03/12/2017 7:24 PM  If 7PM-7AM, please contact night-coverage at [email or web address], password [facility]

## 2017-03-12 NOTE — Progress Notes (Signed)
[facility] Progress Note  Subjective: on HD no c/o, no SOB or CP  Vitals:   03/12/17 0830 03/12/17 0900 03/12/17 0930 03/12/17 1000  BP: (!) 117/49 (!) 107/51 (!) 118/48 114/65  Pulse: 62 65 61 63  Resp: 16 15 16 13   Temp:      TempSrc:      SpO2:      Weight:      Height:        Inpatient medications: . atorvastatin  20 mg Oral QHS  . docusate sodium  100 mg Oral BID  . feeding supplement (NEPRO CARB STEADY)  237 mL Oral BID BM  . ferric citrate  420 mg Oral TID WC  . insulin aspart  0-5 Units Subcutaneous QHS  . insulin aspart  0-9 Units Subcutaneous TID WC  . levETIRAcetam  250 mg Oral Q12H  . levothyroxine  100 mcg Oral QAC breakfast  . linagliptin  5 mg Oral Daily  . lipase/protease/amylase  24,000 Units Oral TID WC  . multivitamin  1 tablet Oral Daily  . pantoprazole (PROTONIX) IV  40 mg Intravenous Q12H  . polyethylene glycol  17 g Oral Daily  . prednisoLONE acetate  1 drop Both Eyes QHS  . sodium chloride flush  3 mL Intravenous Q12H  . traZODone  50 mg Oral QHS   . cefTRIAXone (ROCEPHIN)  IV 2 g (03/11/17 1641)   acetaminophen **OR** acetaminophen, acetaminophen-codeine, morphine injection, ondansetron **OR** ondansetron (ZOFRAN) IV  Exam: Aler,t wdwn WF NO jvd Chest clear bilat RRR no mrg ABd soft ntnd no ascites Ext 1+ edema LE's NF, Ox 3 RUA AVG+bruit,  R abd PD cath clean exit site  Dialysis: TTS [facility] 3.5h   48kg  2/2.25   RUE AVG Heparin: 5000 Units IP PRN mircera 156mg IV q2wks -last 10/9 Venofer 1061mIV x5HD - not started  CCPD: 7x wk, Ca 2.5, Mg 0.5, Dext 1.5;2.5;4.25%, #exchange 3, fill vol 200024mdwell time 3hrs, last fill vol [id number] exchange:0 Access: RUQ double cuff PD catheter Heparin: 5000 Units IP PRN  Last Labs: 9/26: K 3.0, 9/5:Ca 8.7, P 6.4, Alb 3.4, 7/24: PTH 184 Hgb: 9/5: 12.7, 9/26: 9.0, 10/4: 8.5, 10/11: 7.4   Assessment:  1. Symptomatic Anemia: S/p 1Unit pRBC in ED.  FOBT in ED negative.  CT negative  for acute process.   ESA given on 10/9.  Starting IV iron qHDx3 and Aranesp 150mc27mk with HD. 2. Abdominal pain - prob due to constipation. Getting laxatives.  No signs of peritonitis clinically, and PD cath exit site is clean. 3. ESRD -  On HD TTS for last 2 weeks.  Not doing PD now, but says she will go back to PD after she gets home since she doesn't like the needle sticks.  Plan short HD today and reg HD tomorrow.   4. HTN/volume  - BP variable but mostly hypotensive. CXR clear. 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 Continue binder.  6. Nutrition - Albumin 3.0. Renal diet and Prostat.  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    Plan - as above   Rob SKelly Splinter[facility] pager [phone]/15/2018, 10:47 AM    Recent Labs Lab 03/10/17 1848 03/11/17 1504 03/12/17 0800  NA 134* 136 139  K 2.9* 3.8 4.1  CL 96* 102 106  CO2 25 24 22   GLUCOSE 205* 222* 125*  BUN 46* 49* 55*  CREATININE 4.68* 5.19* 5.48*  CALCIUM 8.2* 7.9* 7.9*  PHOS  --  5.0* 5.1*    Recent Labs Lab 03/10/17 1848 03/11/17 1504 03/12/17 0800  AST 26  --   --   ALT 24  --   --   ALKPHOS 77  --   --   BILITOT 0.5  --   --   PROT 5.8*  --   --   ALBUMIN 3.0* 2.7* 2.3*    Recent Labs Lab 03/10/17 1848 03/10/17 2306 03/11/17 1504 03/12/17 0800  WBC 6.4  --  6.1 6.3  NEUTROABS  --   --  4.1 4.3  HGB 7.7* 7.6* 9.3* 8.2*  HCT 24.3* 24.0* 28.9* 25.2*  MCV 94.6  --  92.6 93.7  PLT 95*  --  88* 78*   Iron/TIBC/Ferritin/ %Sat    Component Value Date/Time   IRON 48 03/11/2017 0055   TIBC 251 03/11/2017 0055   FERRITIN 1,058 (H) 03/11/2017 0055   IRONPCTSAT 19 03/11/2017 0055

## 2017-03-12 NOTE — Progress Notes (Signed)
Initial Nutrition Assessment  DOCUMENTATION CODES:   Non-severe (moderate) malnutrition in context of chronic illness  INTERVENTION:   Nepro Shake po BID, each supplement provides 425 kcal and 19 grams protein  NUTRITION DIAGNOSIS:   Malnutrition (Moderate) related to chronic illness (ESRD) as evidenced by moderate depletions of muscle mass, moderate depletion of body fat.  GOAL:   Patient will meet greater than or equal to 90% of their needs  MONITOR:   PO intake, Supplement acceptance, Weight trends, Labs  REASON FOR ASSESSMENT:   Malnutrition Screening Tool    ASSESSMENT:   Pt with PMH significant for DM, ESRD (switches between PD and HD for last 6 months), HTN, NASH, and stroke. Pt reports starting HD treatments 9/26 with her last treatment being 10/11. Pt missed last HD treatment due to diarrhea symptoms. Pt presents this admission with symptomatic anemia.    Spoke with pt at bedside. Reports having no loss in appetite prior to admission but has always been a small quantity eater.   States she eats a good breakfast that consist of eggs, bacon, and toast. A peanut butter/jelly sandwich for lunch and typically goes to a restaurant for dinner. Pt consumes a protein supplement that her dialysis center provides (unable to name) weekly. Discussed the importance of protein intake for HD treatments.   Records indicate pt has maintained her weight of 105-110 since May 2018. Pt did experience a large decrease in wt when starting dialysis treatments 1.5 years ago. Reports no recent unintentional weight loss.   Nutrition-Focused physical exam completed. Findings are moderate fat depletion, moderate muscle depletion, and mild edema.   Medications reviewed and include: ferric citrate, SSI, creon, MVI, IV abx, ferric gluconate Labs reviewed: BUN 55 (H) Creatinine 5.48 (H) Phosphorus 5.1 (H)   Diet Order:  Diet renal with fluid restriction Fluid restriction: 1200 mL Fluid; Room service  appropriate? Yes; Fluid consistency: Thin  Skin:  Reviewed, no issues  Last BM:  PTA  Height:   Ht Readings from Last 1 Encounters:  03/10/17 5' 2"  (1.575 m)    Weight:   Wt Readings from Last 1 Encounters:  03/12/17 110 lb 3.7 oz (50 kg)    Ideal Body Weight:  50 kg  BMI:  Body mass index is 20.16 kg/m.  Estimated Nutritional Needs:   Kcal:  1500-1750 (30-35 kcal/kg)  Protein:  85-95 grams (1.7- 1.9 g/kg)  Fluid:  >1.5 L/day  EDUCATION NEEDS:   Education needs addressed  [place]umble, LDN Clinical Nutrition Pager # [phone]

## 2017-03-13 LAB — RENAL FUNCTION PANEL
ANION GAP: 11 (ref 5–15)
Albumin: 2.7 g/dL — ABNORMAL LOW (ref 3.5–5.0)
BUN: 30 mg/dL — ABNORMAL HIGH (ref 6–20)
CALCIUM: 8.2 mg/dL — AB (ref 8.9–10.3)
CO2: 28 mmol/L (ref 22–32)
Chloride: 98 mmol/L — ABNORMAL LOW (ref 101–111)
Creatinine, Ser: 3.98 mg/dL — ABNORMAL HIGH (ref 0.44–1.00)
GFR calc Af Amer: 12 mL/min — ABNORMAL LOW (ref >60)
GFR, EST NON AFRICAN AMERICAN: 10 mL/min — AB (ref >60)
Glucose, Bld: 142 mg/dL — ABNORMAL HIGH (ref 65–99)
PHOSPHORUS: 3.4 mg/dL (ref 2.5–4.6)
POTASSIUM: 3.5 mmol/L (ref 3.5–5.1)
Sodium: 137 mmol/L (ref 135–145)

## 2017-03-13 LAB — BODY FLUID CELL COUNT WITH DIFFERENTIAL
LYMPHS FL: 8 %
Monocyte-Macrophage-Serous Fluid: 28 % — ABNORMAL LOW (ref 50–90)
NEUTROPHIL FLUID: 64 % — AB (ref 0–25)
Total Nucleated Cell Count, Fluid: 1385 cu mm — ABNORMAL HIGH (ref 0–1000)

## 2017-03-13 LAB — CBC
HEMATOCRIT: 31.1 % — AB (ref 36.0–46.0)
HEMOGLOBIN: 9.6 g/dL — AB (ref 12.0–15.0)
MCH: 29.5 pg (ref 26.0–34.0)
MCHC: 30.9 g/dL (ref 30.0–36.0)
MCV: 95.7 fL (ref 78.0–100.0)
Platelets: 78 10*3/uL — ABNORMAL LOW (ref 150–400)
RBC: 3.25 MIL/uL — ABNORMAL LOW (ref 3.87–5.11)
RDW: 16.4 % — AB (ref 11.5–15.5)
WBC: 7.2 10*3/uL (ref 4.0–10.5)

## 2017-03-13 LAB — GLUCOSE, CAPILLARY
GLUCOSE-CAPILLARY: 141 mg/dL — AB (ref 65–99)
Glucose-Capillary: 308 mg/dL — ABNORMAL HIGH (ref 65–99)
Glucose-Capillary: 291 mg/dL — ABNORMAL HIGH (ref 65–99)

## 2017-03-13 MED ORDER — DEXTROSE 5 % IV SOLN
2.0000 g | INTRAVENOUS | Status: DC
  Administered 2017-03-13: 2 g via INTRAVENOUS
  Filled 2017-03-13 (×2): qty 2

## 2017-03-13 MED ORDER — GENTAMICIN SULFATE 0.1 % EX CREA
TOPICAL_CREAM | Freq: Three times a day (TID) | CUTANEOUS | Status: DC
  Administered 2017-03-13: 23:00:00 via TOPICAL
  Filled 2017-03-13: qty 15

## 2017-03-13 MED ORDER — HEPARIN 1000 UNIT/ML FOR PERITONEAL DIALYSIS: 500.0000 [IU] | INTRAMUSCULAR | Status: DC | PRN

## 2017-03-13 MED ORDER — LIDOCAINE HCL (PF) 1 % IJ SOLN
5.0000 mL | INTRAMUSCULAR | Status: DC | PRN
Start: 2017-03-13 — End: 2017-03-13

## 2017-03-13 MED ORDER — DELFLEX-LC/1.5% DEXTROSE 344 MOSM/L IP SOLN: INTRAPERITONEAL | Status: AC

## 2017-03-13 MED ORDER — LIDOCAINE-PRILOCAINE 2.5-2.5 % EX CREA: 1.0000 "application " | TOPICAL_CREAM | CUTANEOUS | Status: DC | PRN

## 2017-03-13 MED ORDER — SODIUM CHLORIDE 0.9 % IV SOLN: 100.0000 mL | INTRAVENOUS | Status: DC | PRN

## 2017-03-13 MED ORDER — PENTAFLUOROPROP-TETRAFLUOROETH EX AERO: 1.0000 "application " | INHALATION_SPRAY | CUTANEOUS | Status: DC | PRN

## 2017-03-13 MED ORDER — DARBEPOETIN ALFA 150 MCG/0.3ML IJ SOSY
PREFILLED_SYRINGE | INTRAMUSCULAR | Status: AC
  Administered 2017-03-13: 150 ug via INTRAVENOUS
  Filled 2017-03-13: qty 0.3

## 2017-03-13 MED ORDER — DEXTROSE 5 % IV SOLN: 2.0000 g | INTRAVENOUS | Status: DC

## 2017-03-13 MED ORDER — HEPARIN SODIUM (PORCINE) 1000 UNIT/ML DIALYSIS: 1000.0000 [IU] | INTRAMUSCULAR | Status: DC | PRN

## 2017-03-13 NOTE — Progress Notes (Signed)
PD drain started @ 1907 and completed @ 1925, drained off 1513m and collected PD body fluid samples needed. Fluid was clear but was pink/peachy in color. Sample dropped off at lab. Report given to SAlva Garnet RN

## 2017-03-13 NOTE — Progress Notes (Signed)
Pharmacy Antibiotic Note  Samantha Conrad is a 76 y.o. female admitted on 03/10/2017 with intra-abdominal infection (possible SBP).  Pharmacy has been consulted for ceftriaxone dosing.  Plan: Start ceftriaxone 2 g IV q 24 hour  Monitor clinical progress, cultures/sensitivities, abx plan Ceftriaxone does not require renal adjust, pharmacy sign off   Height: 5' 2"  (157.5 cm) Weight: 104 lb 4.4 oz (47.3 kg) IBW/kg (Calculated) : 50.1  Temp (24hrs), Avg:98.2 F (36.8 C), Min:97.8 F (36.6 C), Max:98.7 F (37.1 C)   Recent Labs Lab 03/10/17 1848 03/11/17 1504 03/12/17 0800 03/13/17 0559  WBC 6.4 6.1 6.3 7.2  CREATININE 4.68* 5.19* 5.48* 3.98*    Estimated Creatinine Clearance: 8.8 mL/min (A) (by C-G formula based on SCr of 3.98 mg/dL (H)).    Allergies  Allergen Reactions  . Contrast Media [Iodinated Diagnostic Agents] Swelling    SWELLING REACTION UNSPECIFIED     Antimicrobials this admission: 10/16 ceftriaxone >>  Microbiology results: 10/14 MRSA PCR: negative   Thank you for allowing [place] to participate in this patients care.  Jens Som, PharmD Clinical phone for 03/13/2017 from 7a-3:30p: [phone] If after 3:30p, please call main pharmacy at: [phone] 03/13/2017 1:29 PM

## 2017-03-13 NOTE — Progress Notes (Signed)
[facility] Progress Note  Subjective: on HD no c/o, no SOB or CP  Vitals:   03/13/17 0830 03/13/17 0900 03/13/17 0930 03/13/17 1000  BP: (!) 120/56 (!) 116/50 (!) 118/48 (!) 116/43  Pulse: 64 61 63 60  Resp:      Temp:      TempSrc:      SpO2:      Weight:      Height:        Inpatient medications: . atorvastatin  20 mg Oral QHS  . darbepoetin (ARANESP) injection - DIALYSIS  150 mcg Intravenous Q Tue-HD  . feeding supplement (NEPRO CARB STEADY)  237 mL Oral BID BM  . ferric citrate  420 mg Oral TID WC  . insulin aspart  0-5 Units Subcutaneous QHS  . insulin aspart  0-9 Units Subcutaneous TID WC  . levETIRAcetam  250 mg Oral Q12H  . levothyroxine  100 mcg Oral QAC breakfast  . linagliptin  5 mg Oral Daily  . lipase/protease/amylase  24,000 Units Oral TID WC  . multivitamin  1 tablet Oral Daily  . polyethylene glycol  17 g Oral BID  . prednisoLONE acetate  1 drop Both Eyes QHS  . senna-docusate  2 tablet Oral BID  . sodium chloride flush  3 mL Intravenous Q12H  . traZODone  50 mg Oral QHS   . sodium chloride    . sodium chloride    . ferric gluconate (FERRLECIT/NULECIT) IV 125 mg (03/13/17 1021)   sodium chloride, sodium chloride, acetaminophen **OR** acetaminophen, acetaminophen-codeine, heparin, heparin, lidocaine (PF), lidocaine-prilocaine, morphine injection, ondansetron **OR** ondansetron (ZOFRAN) IV, pentafluoroprop-tetrafluoroeth  Exam: Aler,t wdwn WF NO jvd Chest clear bilat RRR no mrg ABd soft ntnd no ascites Ext 1+ edema LE's NF, Ox 3 RUA AVG+bruit,  R abd PD cath clean exit site  Dialysis: TTS [facility] 3.5h   48kg  2/2.25   RUE AVG Heparin: 5000 Units IP PRN mircera 136mg IV q2wks -last 10/9 Venofer 1048mIV x5HD - not started  CCPD: 7x wk, Ca 2.5, Mg 0.5, Dext 1.5;2.5;4.25%, #exchange 3, fill vol 200039mdwell time 3hrs, last fill vol [id number] exchange:0 Access: RUQ double cuff PD catheter Heparin: 5000 Units IP PRN  Last Labs: 9/26:  K 3.0, 9/5:Ca 8.7, P 6.4, Alb 3.4, 7/24: PTH 184 Hgb: 9/5: 12.7, 9/26: 9.0, 10/4: 8.5, 10/11: 7.4   Assessment:  1. Symptomatic Anemia: S/p 1Unit pRBC in ED.  FOBT in ED negative.  CT negative for acute process.   ESA given on 10/9.  Starting IV iron qHDx3 and Aranesp 150mc51mk with HD. 2. Abdominal pain - having BM's today, but still aching pain in abdomen. Need to r/o PD cath infection, plan is to do one exchange today and send fluid off for cell count and culture.   3. ESRD -  per pt's daughter, pt is on hemodialysis for one month trial, then they are going to reassess and decide PD vs HD from there.  Has had 2 out of 4 wks so when dc'd she will be going back to hemodialysis.   4. HTN/volume  - BP variable but mostly hypotensive. CXR clear. 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 Continue binder.  6. Nutrition - Albumin 3.0. Renal diet and Prostat.  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    Plan - as above   Rob SKelly SplinterarolNewell Rubbermaidr [phone]  03/13/2017, 10:25 AM    Recent Labs Lab 03/11/17 1504 03/12/17 0800 03/13/17 0559  NA 136 139 137  K 3.8 4.1 3.5  CL 102 106 98*  CO2 24 22 28   GLUCOSE 222* 125* 142*  BUN 49* 55* 30*  CREATININE 5.19* 5.48* 3.98*  CALCIUM 7.9* 7.9* 8.2*  PHOS 5.0* 5.1* 3.4    Recent Labs Lab 03/10/17 1848 03/11/17 1504 03/12/17 0800 03/13/17 0559  AST 26  --   --   --   ALT 24  --   --   --   ALKPHOS 77  --   --   --   BILITOT 0.5  --   --   --   PROT 5.8*  --   --   --   ALBUMIN 3.0* 2.7* 2.3* 2.7*    Recent Labs Lab 03/11/17 1504 03/12/17 0800 03/13/17 0559  WBC 6.1 6.3 7.2  NEUTROABS 4.1 4.3  --   HGB 9.3* 8.2* 9.6*  HCT 28.9* 25.2* 31.1*  MCV 92.6 93.7 95.7  PLT 88* 78* 78*   Iron/TIBC/Ferritin/ %Sat    Component Value Date/Time   IRON 48 03/11/2017 0055   TIBC 251 03/11/2017 0055   FERRITIN 1,058 (H) 03/11/2017 0055   IRONPCTSAT 19  03/11/2017 0055

## 2017-03-13 NOTE — Progress Notes (Signed)
[facility] Progress Note  Patient: Samantha Conrad LYY:[id number]   PCP: System, Pcp Not In DOB: 11-09-1940   DOA: 03/10/2017   DOS: 03/13/2017   Date of Service: the patient was seen and examined on 03/13/2017  Subjective: continues to have abdominal pain. Had multiple bowel movements yesterday. Without any blood. No nausea no vomiting. Tolerating oral diet.  Brief hospital course: Pt. with PMH of ESRD on PD, recently switched to HD, type II DM, calcific pancreatitis, [name], seizures, CVA; admitted on 03/10/2017, presented with complaint of abdominal pain, was found to have constipation as well as volume overload. Currently further plan is continue workup for abdominal pain  Assessment and Plan:   Symptomatic anemia - Patient presented with abdominal pain and was found to have worsening anemia noting hemoglobin was 11.2 in June and currently is 7.7 - On iron replacement therapy and current iron levels normal - Has received 1 unit of packed red blood cells, repeat CBCstable. 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 - Possibly related to underlying chronic kidney disease  Active Problems:   Abdominal pain/Constipation -Patient reports issues with constipation although has daily bowel movements Patient will get one cycle of PD and that fluid will be sent to lab for further workup Continue IV ceftriaxone for suspected peritonitis.    Diabetes mellitus 2  -Mildly hyperglycemic in ER -Follow CBGs and provide SSI -HgbA1c 6.8 uncontrolled. -Continue Tradjenta    Hypertension -Current blood pressure somewhat suboptimal in setting of GI symptoms, constipation and volume  depletion -Hold preadmission Norvasc and Cozaar    CKD (chronic kidney disease) stage V requiring chronic dialysis  -Reports has transitioned from hemodialysis to PD and back to hemodialysis -TTS-missed Saturday due to above symptoms -Patient reports significant pain with needle placement and peripheral access and is considering returning to PD -Nephrology consulted    Chronic pancreatitis  -Continue pancreatic enzymes -No prandial or postprandial symptoms -Lipase normal    NASH (nonalcoholic steatohepatitis) -No ascites and LFTs currently normal    Hypothyroidism -Continue Synthroid -TSH especially in context of severe constipation and currently unexplained heme-negative anemia    Seizure disorder/history of CVA -Continue Keppra -hold baby aspirin    HLD (hyperlipidemia) -Not on statin prior to admission  Diet: renal diet DVT Prophylaxis:mechanical compression device  Advance goals of care discussion: full code  Family Communication: no family was present at bedside, at the time of interview.  Disposition:  Discharge to home.  Consultants: nephrology Procedures: HD  Antibiotics: Anti-infectives    Start     Dose/Rate Route Frequency Ordered Stop   03/13/17 1330  cefTRIAXone (ROCEPHIN) 2 g in dextrose 5 % 50 mL IVPB     2 g 100 mL/hr over 30 Minutes Intravenous Every 24 hours 03/13/17 1326     03/13/17 1030  cefTRIAXone (ROCEPHIN) 2 g in dextrose 5 % 50 mL IVPB  Status:  Discontinued     2 g 100 mL/hr over 30 Minutes Intravenous Every 24 hours 03/13/17 1025 03/13/17 1245   03/11/17 1515  cefTRIAXone (ROCEPHIN) 2 g in dextrose 5 % 50 mL IVPB  Status:  Discontinued     2 g 100 mL/hr over 30 Minutes Intravenous Every 24 hours 03/11/17 1514  03/13/17 0812   03/11/17 0145  cefTRIAXone (ROCEPHIN) 1 g in dextrose 5 % 50 mL IVPB     1 g 100 mL/hr over 30 Minutes Intravenous  Once 03/11/17 0133 03/11/17 0501       Objective: Physical Exam: Vitals:   03/13/17  1115 03/13/17 1126 03/13/17 1240 03/13/17 1346  BP: (!) 109/46 (!) 119/49 (!) 126/57 (!) 141/46  Pulse: 64 62 64 66  Resp:  15 12 18   Temp:  97.8 F (36.6 C) 97.8 F (36.6 C) 97.6 F (36.4 C)  TempSrc:  Oral Oral Oral  SpO2:  98% 98% 99%  Weight:  47.3 kg (104 lb 4.4 oz)    Height:        Intake/Output Summary (Last 24 hours) at 03/13/17 1922 Last data filed at 03/13/17 1126  Gross per 24 hour  Intake                3 ml  Output             2300 ml  Net            -2297 ml   Filed Weights   03/13/17 0500 03/13/17 0750 03/13/17 1126  Weight: 49.4 kg (108 lb 14.4 oz) 49.6 kg (109 lb 5.6 oz) 47.3 kg (104 lb 4.4 oz)   General: Alert, Awake and Oriented to Time, Place and Person. Appear in mild distress, affect appropriate Eyes: PERRL, Conjunctiva normal ENT: Oral Mucosa clear moist. Neck: no JVD, no Abnormal Mass Or lumps Cardiovascular: S1 and S2 Present, no Murmur, Peripheral Pulses Present Respiratory: normal respiratory effort, Bilateral Air entry equal and Decreased, no use of accessory muscle, Clear to Auscultation, no Crackles, no wheezes Abdomen: Bowel Sound present, Soft and no tenderness, no hernia Skin: no redness, no Rash, no induration Extremities: no Pedal edema, no calf tenderness Neurologic: Grossly no focal neuro deficit. Bilaterally Equal motor strength  Data Reviewed: CBC:  Recent Labs Lab 03/10/17 1848 03/10/17 2306 03/11/17 1504 03/12/17 0800 03/13/17 0559  WBC 6.4  --  6.1 6.3 7.2  NEUTROABS  --   --  4.1 4.3  --   HGB 7.7* 7.6* 9.3* 8.2* 9.6*  HCT 24.3* 24.0* 28.9* 25.2* 31.1*  MCV 94.6  --  92.6 93.7 95.7  PLT 95*  --  88* 78* 78*   Basic Metabolic Panel:  Recent Labs Lab 03/10/17 1848 03/11/17 1504 03/12/17 0800 03/13/17 0559  NA 134* 136 139 137  K 2.9* 3.8 4.1 3.5  CL 96* 102 106 98*  CO2 25 24 22 28   GLUCOSE 205* 222* 125* 142*  BUN 46* 49* 55* 30*  CREATININE 4.68* 5.19* 5.48* 3.98*  CALCIUM 8.2* 7.9* 7.9* 8.2*  PHOS  --   5.0* 5.1* 3.4    Liver Function Tests:  Recent Labs Lab 03/10/17 1848 03/11/17 1504 03/12/17 0800 03/13/17 0559  AST 26  --   --   --   ALT 24  --   --   --   ALKPHOS 77  --   --   --   BILITOT 0.5  --   --   --   PROT 5.8*  --   --   --   ALBUMIN 3.0* 2.7* 2.3* 2.7*    Recent Labs Lab 03/10/17 1848  LIPASE 12   No results for input(s): AMMONIA in the last 168 hours. Coagulation Profile:  Recent Labs Lab 03/11/17 0055  INR 1.02   Cardiac Enzymes: No results for input(s):  CKTOTAL, CKMB, CKMBINDEX, TROPONINI in the last 168 hours. BNP (last 3 results) No results for input(s): PROBNP in the last 8760 hours. CBG:  Recent Labs Lab 03/12/17 1200 03/12/17 1638 03/12/17 2123 03/13/17 1646 03/13/17 1839  GLUCAP 94 283* 92 308* 291*   Studies: No results found.  Scheduled Meds: . atorvastatin  20 mg Oral QHS  . darbepoetin (ARANESP) injection - DIALYSIS  150 mcg Intravenous Q Tue-HD  . feeding supplement (NEPRO CARB STEADY)  237 mL Oral BID BM  . ferric citrate  420 mg Oral TID WC  . gentamicin cream   Topical TID  . insulin aspart  0-5 Units Subcutaneous QHS  . insulin aspart  0-9 Units Subcutaneous TID WC  . levETIRAcetam  250 mg Oral Q12H  . levothyroxine  100 mcg Oral QAC breakfast  . linagliptin  5 mg Oral Daily  . lipase/protease/amylase  24,000 Units Oral TID WC  . multivitamin  1 tablet Oral Daily  . polyethylene glycol  17 g Oral BID  . prednisoLONE acetate  1 drop Both Eyes QHS  . senna-docusate  2 tablet Oral BID  . sodium chloride flush  3 mL Intravenous Q12H  . traZODone  50 mg Oral QHS   Continuous Infusions: . cefTRIAXone (ROCEPHIN)  IV Stopped (03/13/17 1500)  . ferric gluconate (FERRLECIT/NULECIT) IV 125 mg (03/13/17 1021)   PRN Meds: acetaminophen **OR** acetaminophen, acetaminophen-codeine, morphine injection, ondansetron **OR** ondansetron (ZOFRAN) IV  Time spent: 35 minutes  Author: Berle Mull, MD [facility] Pager:  [phone] 03/13/2017 7:22 PM  If 7PM-7AM, please contact night-coverage at [email or web address], password [email or web address]

## 2017-03-13 NOTE — Progress Notes (Signed)
Report given to Dan,RN in dialysis. Will continue to monitor and treat per MD orders.

## 2017-03-14 DIAGNOSIS — D649 Anemia, unspecified: Secondary | ICD-10-CM

## 2017-03-14 LAB — PATHOLOGIST SMEAR REVIEW

## 2017-03-14 LAB — RENAL FUNCTION PANEL
ALBUMIN: 2.4 g/dL — AB (ref 3.5–5.0)
Anion gap: 8 (ref 5–15)
BUN: 20 mg/dL (ref 6–20)
CALCIUM: 7.7 mg/dL — AB (ref 8.9–10.3)
CO2: 27 mmol/L (ref 22–32)
CREATININE: 2.81 mg/dL — AB (ref 0.44–1.00)
Chloride: 98 mmol/L — ABNORMAL LOW (ref 101–111)
GFR calc Af Amer: 18 mL/min — ABNORMAL LOW (ref >60)
GFR, EST NON AFRICAN AMERICAN: 15 mL/min — AB (ref >60)
Glucose, Bld: 121 mg/dL — ABNORMAL HIGH (ref 65–99)
PHOSPHORUS: 2.6 mg/dL (ref 2.5–4.6)
Potassium: 3.5 mmol/L (ref 3.5–5.1)
SODIUM: 133 mmol/L — AB (ref 135–145)

## 2017-03-14 LAB — CBC
HCT: 27.6 % — ABNORMAL LOW (ref 36.0–46.0)
Hemoglobin: 8.9 g/dL — ABNORMAL LOW (ref 12.0–15.0)
MCH: 30.9 pg (ref 26.0–34.0)
MCHC: 32.2 g/dL (ref 30.0–36.0)
MCV: 95.8 fL (ref 78.0–100.0)
PLATELETS: 49 10*3/uL — AB (ref 150–400)
RBC: 2.88 MIL/uL — ABNORMAL LOW (ref 3.87–5.11)
RDW: 16.7 % — AB (ref 11.5–15.5)
WBC: 7.4 10*3/uL (ref 4.0–10.5)

## 2017-03-14 LAB — GLUCOSE, CAPILLARY
GLUCOSE-CAPILLARY: 210 mg/dL — AB (ref 65–99)
Glucose-Capillary: 124 mg/dL — ABNORMAL HIGH (ref 65–99)
Glucose-Capillary: 115 mg/dL — ABNORMAL HIGH (ref 65–99)
Glucose-Capillary: 217 mg/dL — ABNORMAL HIGH (ref 65–99)

## 2017-03-14 MED ORDER — VANCOMYCIN HCL 500 MG IV SOLR
500.0000 mg | INTRAVENOUS | Status: DC
  Filled 2017-03-14: qty 500

## 2017-03-14 MED ORDER — PANCRELIPASE (LIP-PROT-AMYL) 12000-38000 UNITS PO CPEP
48000.0000 [IU] | ORAL_CAPSULE | Freq: Three times a day (TID) | ORAL | Status: DC
  Administered 2017-03-14 – 2017-03-16 (×7): 48000 [IU] via ORAL
  Filled 2017-03-14 (×6): qty 4

## 2017-03-14 MED ORDER — DEXTROSE 5 % IV SOLN: 2.0000 g | INTRAVENOUS | Status: DC

## 2017-03-14 MED ORDER — HEPARIN 1000 UNIT/ML FOR PERITONEAL DIALYSIS: 500.0000 [IU] | INTRAMUSCULAR | Status: DC | PRN

## 2017-03-14 MED ORDER — HEPARIN 1000 UNIT/ML FOR PERITONEAL DIALYSIS
INTRAMUSCULAR | Status: DC | PRN
  Filled 2017-03-14: qty 5000

## 2017-03-14 MED ORDER — SODIUM CHLORIDE 0.9 % IV SOLN
1250.0000 mg | Freq: Once | INTRAVENOUS | Status: AC
  Administered 2017-03-14: 1250 mg via INTRAVENOUS
  Filled 2017-03-14: qty 1250

## 2017-03-14 MED ORDER — DEXTROSE 5 % IV SOLN
2.0000 g | Freq: Once | INTRAVENOUS | Status: AC
  Administered 2017-03-14: 2 g via INTRAVENOUS
  Filled 2017-03-14: qty 2

## 2017-03-14 MED ORDER — GENTAMICIN SULFATE 0.1 % EX CREA
1.0000 "application " | TOPICAL_CREAM | Freq: Every day | CUTANEOUS | Status: DC
  Filled 2017-03-14: qty 15

## 2017-03-14 MED ORDER — DELFLEX-LC/1.5% DEXTROSE 344 MOSM/L IP SOLN
INTRAPERITONEAL | Status: DC
  Administered 2017-03-14: 19:00:00 via INTRAPERITONEAL

## 2017-03-14 MED ORDER — GENTAMICIN SULFATE 0.1 % EX CREA
1.0000 "application " | TOPICAL_CREAM | Freq: Every day | CUTANEOUS | Status: DC
  Administered 2017-03-14: 1 via TOPICAL
  Filled 2017-03-14: qty 15

## 2017-03-14 NOTE — Progress Notes (Signed)
[facility] Progress Note  Subjective: had abd pain last night.  PD fluid was cloudy with cell ct > 1000. Starting on IV fortaz/ vanc this am and cx's are pending.    Vitals:   03/13/17 2309 03/13/17 2320 03/14/17 0645 03/14/17 0645  BP:  (!) 106/37  (!) 107/39  Pulse: 69 66  67  Resp: 17   17  Temp: 99.6 F (37.6 C)   98.3 F (36.8 C)  TempSrc: Oral   Oral  SpO2: 98%   97%  Weight:   51 kg (112 lb 7 oz)   Height:        Inpatient medications: . atorvastatin  20 mg Oral QHS  . darbepoetin (ARANESP) injection - DIALYSIS  150 mcg Intravenous Q Tue-HD  . feeding supplement (NEPRO CARB STEADY)  237 mL Oral BID BM  . ferric citrate  420 mg Oral TID WC  . gentamicin cream   Topical TID  . insulin aspart  0-5 Units Subcutaneous QHS  . insulin aspart  0-9 Units Subcutaneous TID WC  . levETIRAcetam  250 mg Oral Q12H  . levothyroxine  100 mcg Oral QAC breakfast  . linagliptin  5 mg Oral Daily  . lipase/protease/amylase  48,000 Units Oral TID WC  . multivitamin  1 tablet Oral Daily  . polyethylene glycol  17 g Oral BID  . prednisoLONE acetate  1 drop Both Eyes QHS  . senna-docusate  2 tablet Oral BID  . sodium chloride flush  3 mL Intravenous Q12H  . traZODone  50 mg Oral QHS   . cefTAZidime (FORTAZ)  IV    . [START ON 03/15/2017] cefTAZidime (FORTAZ)  IV    . ferric gluconate (FERRLECIT/NULECIT) IV 125 mg (03/13/17 1021)  . vancomycin    . [START ON 03/15/2017] vancomycin     acetaminophen **OR** acetaminophen, acetaminophen-codeine, morphine injection, ondansetron **OR** ondansetron (ZOFRAN) IV  Exam: Aler,t wdwn WF NO jvd Chest clear bilat RRR no mrg ABd soft ntnd no ascites Ext 1+ edema LE's NF, Ox 3 RUA AVG+bruit,  R abd PD cath clean exit site  Dialysis: TTS [facility] 3.5h   48kg  2/2.25   RUE AVG Heparin: 5000 Units IP PRN mircera 153mg IV q2wks -last 10/9 Venofer 1076mIV x5HD - not started  CCPD: 7x wk, Ca 2.5, Mg 0.5, Dext 1.5;2.5;4.25%,  #exchange 3, fill vol 200055mdwell time 3hrs, last fill vol [id number] exchange:0 Access: RUQ double cuff PD catheter Heparin: 5000 Units IP PRN  Last Labs: 9/26: K 3.0, 9/5:Ca 8.7, P 6.4, Alb 3.4, 7/24: PTH 184 Hgb: 9/5: 12.7, 9/26: 9.0, 10/4: 8.5, 10/11: 7.4   Assessment:  1. Abdominal pain - fluid studies are suggestive of PD cath related peritonitis. This may be cause of her abd pain.  Will need to await cx results; have changed abx to IV vanc/ fortaz and will switch patient over to PD for now and get daily cell counts.  Hold HD for now.   2. Symptomatic Anemia: S/p 1Unit pRBC in ED.  FOBT in ED negative.  CT negative for acute process.   ESA given on 10/9.  Starting IV iron qHDx3 and Aranesp 150mc71mk with HD 3. ESRD -  per pt's daughter, pt on hemodialysis for one month trial (2 wks done), then they are going to reassess and decide PD vs HD from there. 4. HTN/volume  - BP variable but mostly hypotensive. CXR clear. 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 Continue binder.  6. Nutrition - Albumin 3.0. Renal diet and Prostat.  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    Plan - as above   Kelly Splinter MD [facility] pager [phone]   03/14/2017, 11:08 AM    Recent Labs Lab 03/12/17 0800 03/13/17 0559 03/14/17 0640  NA 139 137 133*  K 4.1 3.5 3.5  CL 106 98* 98*  CO2 22 28 27   GLUCOSE 125* 142* 121*  BUN 55* 30* 20  CREATININE 5.48* 3.98* 2.81*  CALCIUM 7.9* 8.2* 7.7*  PHOS 5.1* 3.4 2.6    Recent Labs Lab 03/10/17 1848  03/12/17 0800 03/13/17 0559 03/14/17 0640  AST 26  --   --   --   --   ALT 24  --   --   --   --   ALKPHOS 77  --   --   --   --   BILITOT 0.5  --   --   --   --   PROT 5.8*  --   --   --   --   ALBUMIN 3.0*  < > 2.3* 2.7* 2.4*  < > = values in this interval not displayed.  Recent Labs Lab 03/11/17 1504 03/12/17 0800 03/13/17 0559 03/14/17 0640  WBC 6.1 6.3 7.2 7.4   NEUTROABS 4.1 4.3  --   --   HGB 9.3* 8.2* 9.6* 8.9*  HCT 28.9* 25.2* 31.1* 27.6*  MCV 92.6 93.7 95.7 95.8  PLT 88* 78* 78* 49*   Iron/TIBC/Ferritin/ %Sat    Component Value Date/Time   IRON 48 03/11/2017 0055   TIBC 251 03/11/2017 0055   FERRITIN 1,058 (H) 03/11/2017 0055   IRONPCTSAT 19 03/11/2017 0055

## 2017-03-14 NOTE — Progress Notes (Signed)
Pharmacy Antibiotic Note  Samantha Conrad is a 76 y.o. female admitted on 03/10/2017 with abdominal pain.  Pharmacy has been consulted for vancomycin and ceftazidime dosing for possible PD cath peritonitis. She has ESRD and is currently receiving HD on TTS, last HD 10/16.  Plan: Vancomycin 1250 mg IV once then 500 mg IV with HD TTS Ceftazidime 2 g IV today then after HD TTS F/u HD schedule, clinical progress and culture data  Height: 5' 2"  (157.5 cm) Weight: 112 lb 7 oz (51 kg) IBW/kg (Calculated) : 50.1  Temp (24hrs), Avg:98.2 F (36.8 C), Min:97.6 F (36.4 C), Max:99.6 F (37.6 C)   Recent Labs Lab 03/10/17 1848 03/11/17 1504 03/12/17 0800 03/13/17 0559 03/14/17 0640  WBC 6.4 6.1 6.3 7.2 7.4  CREATININE 4.68* 5.19* 5.48* 3.98* 2.81*    Estimated Creatinine Clearance: 13.3 mL/min (A) (by C-G formula based on SCr of 2.81 mg/dL (H)).    Allergies  Allergen Reactions  . Contrast Media [Iodinated Diagnostic Agents] Swelling    SWELLING REACTION UNSPECIFIED     Antimicrobials this admission: CTX 10/14>>10/16 Vanc 10/17>> Ceftaz 10/17>>  Dose adjustments this admission:   Microbiology results: 10/16 peritoneal dialysate:  10/14 MRSA PCR neg   Thank you for allowing pharmacy to be a part of this patient's care.  Renold Genta, PharmD, BCPS Clinical Pharmacist Phone for today - [place] - [phone] 03/14/2017 8:12 AM

## 2017-03-14 NOTE — Progress Notes (Signed)
PD tx initiated via tenckhoff w/o problem, VSS, report given to Jeanella Flattery, RN

## 2017-03-14 NOTE — Progress Notes (Signed)
PROGRESS NOTE    Samantha Conrad  SWF:[id number] DOB: 1940/09/22 DOA: 03/10/2017 PCP: System, Pcp Not In  Outpatient Specialists:     Brief Narrative:  76 year old female presented with abdominal pain, generalized weakness, and diarrhea x 3 days on 10/14.  PMHx significant for end stage renal disease on peritoneal dialysis (recently switched to hemodialysis, fistula graft in right upper arm), type II DM, calcific pancreatitis, [name], seizures, and CVA.  Nephrology consulted. -CT abd/pelvis done on 10/14 showed large volume colonic stool as well as pancreatic calcifications consistent with known chronic pancreatitis. -CXR done 10/14 showed mild blunting of costophrenic angles, otherwise negative.  Assessment & Plan:   Principal Problem:   Symptomatic anemia Active Problems:   Diet-controlled diabetes mellitus (HCC)   NASH (nonalcoholic steatohepatitis)   Hypertension   Hypothyroidism   CKD (chronic kidney disease) stage V requiring chronic dialysis (HCC)   Seizure disorder   Abdominal pain   HLD (hyperlipidemia)   Chronic pancreatitis (HCC)   Constipation   1. Symptomatic anemia - Continue to monitor CBC - Hemoglobin 8.9 today (lowest was 7.6 on 10/13). - Received 1 unit of packed red blood cells in ED. - On iron replacement therapy.  Ironlevels normal on 10/14. - FOBT negative on 10/14.    2. Abdominal pain/Constipation - Continue IV vanc/fortaz as per Nephrology recommendations - Peritoneal fluid suggestive of infection.  Waiting for culture results. - Continue supportive care for constipation (Miralax, Senakot, etc.)  3. Diabetes mellitus 2  - Continue to monitor CBG - Continue linagliptin and novolog - HgbA1c 6.8 on 10/14 - CBG was 124 this morning  4. Hypertension - Hold preadmission Norvasc and Cozaar as patient's diastolic pressures have been in 40-60s during admission  5. Chronic kidney disease stage V, requiring chronic dialysis  - Nephrology  consulted (Dr. Jonnie Finner).  They plan to await peritoneal fluid culture results.  They switched patient from hemodialysis to peritoneal dialysis and will get daily cell counts.    6. Chronic pancreatitis  - Continue pancreatic enzymes - Patient continues to be asymptomatic - Lipase was 12 on 10/13  7. NASH (nonalcoholic steatohepatitis) - No ascites on exam, LFTs unremarkable on 10/13  8. Hypothyroidism - Continue Synthroid daily - TSH 4.262 on 10/14  9. Seizure disorder/history of CVA - Continue Keppra BID  10. Hyperlipidemia - Continue atorvastatin daily  11. Thrombocytopenia - Unknown etiology.  Possible medication side effect vs. liver dysfunction vs. Hyper-anticoagulation - No signs of active bleeding. - Avoid anticoagulants - Monitor cell count  DVT prophylaxis: SCDs Code Status: Full Family Communication: Discussed with patient Disposition Plan: Discharge to home   Consultants:   Nephrology (Dr. Roney Jaffe)  Pharmacology Kenney Houseman [place], [facility])  Nutrition [facility] [place], RD)  Procedures:   Peritoneal dialysis - daily  Antimicrobials:   IV ceftazidime 2 g at 100 mL/hr: every T-Th-Sa (1800) over 30 Minutes (after dialysis)  IV vancomycin 500 mg at 100 mL/hr: every T-Th-Sa (Hemodialysis) over 60 Minutes (after dialysis)  Topical gentamycin cream 0.1% for peritoneal exit site   Subjective: Patient states she is feeling much improved - "this is the best I've felt in several days". Her abdominal pain has improved and patient currently denies nausea/vomiting.  She has been able to tolerate solid foods and liquids well.  Reports daily BMs - denies pain, blood.  Spoke with Nephrology earlier today.  Patient has no questions at this time.  Objective: Vitals:   03/13/17 2202 03/13/17 2320 03/14/17 0645 03/14/17 5427  BP:  (!) 106/37  (!) 107/39  Pulse: 69 66  67  Resp: 17   17  Temp: 99.6 F (37.6 C)   98.3 F (36.8 C)  TempSrc: Oral   Oral  SpO2: 98%    97%  Weight:   51 kg (112 lb 7 oz)   Height:        Intake/Output Summary (Last 24 hours) at 03/14/17 1402 Last data filed at 03/14/17 0543  Gross per 24 hour  Intake              120 ml  Output                0 ml  Net              120 ml   Filed Weights   03/13/17 0750 03/13/17 1126 03/14/17 0645  Weight: 49.6 kg (109 lb 5.6 oz) 47.3 kg (104 lb 4.4 oz) 51 kg (112 lb 7 oz)    Examination:  General exam: Appears calm and comfortable, no acute distress. Respiratory system: Clear to auscultation. Respiratory effort normal. Cardiovascular system: S1 & S2 heard, RRR. No JVD, murmurs, rubs, gallops or clicks. No pedal edema. Gastrointestinal system: Abdomen is thin, nondistended, soft and nontender. No organomegaly or masses felt. Normal bowel sounds heard.  Peritoneal exit drain is dressed, dry, and intact.   Central nervous system: Alert and oriented. No focal neurological deficits. Extremities: Symmetric 5 x 5 power.  SCDs in place.  Right bicipital fistula intact and dressed.  Palpable thrill both proximal and distal to graft. Skin: No rashes, lesions or ulcers Psychiatry: Judgement and insight appear normal. Mood & affect appropriate.     Data Reviewed: I have personally reviewed following labs and imaging studies  CBC:  Recent Labs Lab 03/10/17 1848 03/10/17 2306 03/11/17 1504 03/12/17 0800 03/13/17 0559 03/14/17 0640  WBC 6.4  --  6.1 6.3 7.2 7.4  NEUTROABS  --   --  4.1 4.3  --   --   HGB 7.7* 7.6* 9.3* 8.2* 9.6* 8.9*  HCT 24.3* 24.0* 28.9* 25.2* 31.1* 27.6*  MCV 94.6  --  92.6 93.7 95.7 95.8  PLT 95*  --  88* 78* 78* 49*   Basic Metabolic Panel:  Recent Labs Lab 03/10/17 1848 03/11/17 1504 03/12/17 0800 03/13/17 0559 03/14/17 0640  NA 134* 136 139 137 133*  K 2.9* 3.8 4.1 3.5 3.5  CL 96* 102 106 98* 98*  CO2 25 24 22 28 27   GLUCOSE 205* 222* 125* 142* 121*  BUN 46* 49* 55* 30* 20  CREATININE 4.68* 5.19* 5.48* 3.98* 2.81*  CALCIUM 8.2* 7.9* 7.9*  8.2* 7.7*  PHOS  --  5.0* 5.1* 3.4 2.6   GFR: Estimated Creatinine Clearance: 13.3 mL/min (A) (by C-G formula based on SCr of 2.81 mg/dL (H)). Liver Function Tests:  Recent Labs Lab 03/10/17 1848 03/11/17 1504 03/12/17 0800 03/13/17 0559 03/14/17 0640  AST 26  --   --   --   --   ALT 24  --   --   --   --   ALKPHOS 77  --   --   --   --   BILITOT 0.5  --   --   --   --   PROT 5.8*  --   --   --   --   ALBUMIN 3.0* 2.7* 2.3* 2.7* 2.4*    Recent Labs Lab 03/10/17 1848  LIPASE 12   No  results for input(s): AMMONIA in the last 168 hours. Coagulation Profile:  Recent Labs Lab 03/11/17 0055  INR 1.02   Cardiac Enzymes: No results for input(s): CKTOTAL, CKMB, CKMBINDEX, TROPONINI in the last 168 hours. BNP (last 3 results) No results for input(s): PROBNP in the last 8760 hours. HbA1C:  Recent Labs  03/11/17 1504  HGBA1C 6.8*   CBG:  Recent Labs Lab 03/13/17 1646 03/13/17 1839 03/13/17 2312 03/14/17 0732 03/14/17 1153  GLUCAP 308* 291* 141* 124* 210*   Lipid Profile: No results for input(s): CHOL, HDL, LDLCALC, TRIG, CHOLHDL, LDLDIRECT in the last 72 hours. Thyroid Function Tests: No results for input(s): TSH, T4TOTAL, FREET4, T3FREE, THYROIDAB in the last 72 hours. Anemia Panel: No results for input(s): VITAMINB12, FOLATE, FERRITIN, TIBC, IRON, RETICCTPCT in the last 72 hours. Urine analysis:    Component Value Date/Time   COLORURINE YELLOW 03/11/2017 2008   APPEARANCEUR CLEAR 03/11/2017 2008   LABSPEC 1.007 03/11/2017 2008   PHURINE 7.0 03/11/2017 2008   GLUCOSEU >=500 (A) 03/11/2017 2008   HGBUR NEGATIVE 03/11/2017 2008   [place] NEGATIVE 03/11/2017 2008   [place] 03/11/2017 2008   PROTEINUR 30 (A) 03/11/2017 2008   NITRITE NEGATIVE 03/11/2017 2008   LEUKOCYTESUR SMALL (A) 03/11/2017 2008   Sepsis Labs: @LABRCNTIP (procalcitonin:4,lacticidven:4)  ) Recent Results (from the past 240 hour(s))  MRSA PCR Screening     Status:  None   Collection Time: 03/11/17  3:54 PM  Result Value Ref Range Status   MRSA by PCR NEGATIVE NEGATIVE Final    Comment:        The GeneXpert MRSA Assay (FDA approved for NASAL specimens only), is one component of a comprehensive MRSA colonization surveillance program. It is not intended to diagnose MRSA infection nor to guide or monitor treatment for MRSA infections.   Body fluid culture     Status: None (Preliminary result)   Collection Time: 03/13/17  2:00 PM  Result Value Ref Range Status   Specimen Description PERITONEAL DIALYSATE  Final   Special Requests Immunocompromised  Final   Gram Stain   Final    FEW WBC PRESENT,BOTH PMN AND MONONUCLEAR NO ORGANISMS SEEN    Culture NO GROWTH < 24 HOURS  Final   Report Status PENDING  Incomplete         Radiology Studies: No results found.      Scheduled Meds: . atorvastatin  20 mg Oral QHS  . darbepoetin (ARANESP) injection - DIALYSIS  150 mcg Intravenous Q Tue-HD  . feeding supplement (NEPRO CARB STEADY)  237 mL Oral BID BM  . ferric citrate  420 mg Oral TID WC  . gentamicin cream  1 application Topical Daily  . insulin aspart  0-5 Units Subcutaneous QHS  . insulin aspart  0-9 Units Subcutaneous TID WC  . levETIRAcetam  250 mg Oral Q12H  . levothyroxine  100 mcg Oral QAC breakfast  . linagliptin  5 mg Oral Daily  . lipase/protease/amylase  48,000 Units Oral TID WC  . multivitamin  1 tablet Oral Daily  . polyethylene glycol  17 g Oral BID  . prednisoLONE acetate  1 drop Both Eyes QHS  . senna-docusate  2 tablet Oral BID  . sodium chloride flush  3 mL Intravenous Q12H  . traZODone  50 mg Oral QHS   Continuous Infusions: . [START ON 03/15/2017] cefTAZidime (FORTAZ)  IV    . dialysis solution 1.5% low-MG/low-CA    . ferric gluconate (FERRLECIT/NULECIT) IV 125 mg (03/13/17 1021)  . [  START ON 03/15/2017] vancomycin       LOS: 3 days      T[place]ania Hall-Potvin, PA-S [facility] Pager [phone]  [phone]  If 7PM-7AM, please contact night-coverage [email or web address] Password [email or web address] 03/14/2017, 2:02 PM

## 2017-03-15 LAB — BODY FLUID CELL COUNT WITH DIFFERENTIAL
LYMPHS FL: 4 %
MONOCYTE-MACROPHAGE-SEROUS FLUID: 20 % — AB (ref 50–90)
Neutrophil Count, Fluid: 76 % — ABNORMAL HIGH (ref 0–25)
WBC FLUID: 1390 uL — AB (ref 0–1000)

## 2017-03-15 LAB — CBC
HEMATOCRIT: 29.2 % — AB (ref 36.0–46.0)
Hemoglobin: 9.4 g/dL — ABNORMAL LOW (ref 12.0–15.0)
MCH: 30.8 pg (ref 26.0–34.0)
MCHC: 32.2 g/dL (ref 30.0–36.0)
MCV: 95.7 fL (ref 78.0–100.0)
PLATELETS: 57 10*3/uL — AB (ref 150–400)
RBC: 3.05 MIL/uL — AB (ref 3.87–5.11)
RDW: 16.6 % — AB (ref 11.5–15.5)
WBC: 5.8 10*3/uL (ref 4.0–10.5)

## 2017-03-15 LAB — GLUCOSE, CAPILLARY
GLUCOSE-CAPILLARY: 172 mg/dL — AB (ref 65–99)
GLUCOSE-CAPILLARY: 257 mg/dL — AB (ref 65–99)
Glucose-Capillary: 135 mg/dL — ABNORMAL HIGH (ref 65–99)
Glucose-Capillary: 175 mg/dL — ABNORMAL HIGH (ref 65–99)

## 2017-03-15 LAB — RENAL FUNCTION PANEL
Albumin: 2.4 g/dL — ABNORMAL LOW (ref 3.5–5.0)
Anion gap: 12 (ref 5–15)
BUN: 27 mg/dL — AB (ref 6–20)
CHLORIDE: 96 mmol/L — AB (ref 101–111)
CO2: 26 mmol/L (ref 22–32)
CREATININE: 3.19 mg/dL — AB (ref 0.44–1.00)
Calcium: 8.1 mg/dL — ABNORMAL LOW (ref 8.9–10.3)
GFR calc Af Amer: 15 mL/min — ABNORMAL LOW (ref >60)
GFR calc non Af Amer: 13 mL/min — ABNORMAL LOW (ref >60)
Glucose, Bld: 187 mg/dL — ABNORMAL HIGH (ref 65–99)
POTASSIUM: 2.9 mmol/L — AB (ref 3.5–5.1)
Phosphorus: 3.2 mg/dL (ref 2.5–4.6)
Sodium: 134 mmol/L — ABNORMAL LOW (ref 135–145)

## 2017-03-15 MED ORDER — DEXTROSE 5 % IV SOLN
500.0000 mg | INTRAVENOUS | Status: DC
  Administered 2017-03-15 – 2017-03-16 (×2): 500 mg via INTRAVENOUS
  Filled 2017-03-15 (×4): qty 0.5

## 2017-03-15 MED ORDER — POLYETHYLENE GLYCOL 3350 17 G PO PACK: 17.0000 g | PACK | Freq: Every day | ORAL | Status: DC | PRN

## 2017-03-15 MED ORDER — POTASSIUM CHLORIDE CRYS ER 20 MEQ PO TBCR
40.0000 meq | EXTENDED_RELEASE_TABLET | Freq: Two times a day (BID) | ORAL | Status: AC
  Administered 2017-03-15 (×2): 40 meq via ORAL
  Filled 2017-03-15 (×2): qty 2

## 2017-03-15 NOTE — Care Management Important Message (Signed)
Important Message  Patient Details  Name: Samantha Conrad MRN: [id number] Date of Birth: 1941-01-28   Medicare Important Message Given:  Yes    Nathen May 03/15/2017, 12:35 PM

## 2017-03-15 NOTE — Progress Notes (Signed)
[facility] Progress Note  Subjective: abd pain much better, constipation resolved  Vitals:   03/14/17 1300 03/14/17 1826 03/14/17 2201 03/15/17 0553  BP: (!) 118/41 (!) 126/45 (!) 125/45 (!) 136/53  Pulse: 67 67 66 71  Resp: 18 20 16 16   Temp: 97.7 F (36.5 C)  98.6 F (37 C) 97.8 F (36.6 C)  TempSrc: Oral Oral Oral Oral  SpO2: 100% 100% 100% 100%  Weight:  46.9 kg (103 lb 6.3 oz)    Height:        Inpatient medications: . atorvastatin  20 mg Oral QHS  . darbepoetin (ARANESP) injection - DIALYSIS  150 mcg Intravenous Q Tue-HD  . feeding supplement (NEPRO CARB STEADY)  237 mL Oral BID BM  . ferric citrate  420 mg Oral TID WC  . gentamicin cream  1 application Topical Daily  . insulin aspart  0-5 Units Subcutaneous QHS  . insulin aspart  0-9 Units Subcutaneous TID WC  . levETIRAcetam  250 mg Oral Q12H  . levothyroxine  100 mcg Oral QAC breakfast  . linagliptin  5 mg Oral Daily  . lipase/protease/amylase  48,000 Units Oral TID WC  . multivitamin  1 tablet Oral Daily  . potassium chloride  40 mEq Oral BID  . prednisoLONE acetate  1 drop Both Eyes QHS  . sodium chloride flush  3 mL Intravenous Q12H  . traZODone  50 mg Oral QHS   . cefTAZidime (FORTAZ)  IV    . dialysis solution 1.5% low-MG/low-CA    . ferric gluconate (FERRLECIT/NULECIT) IV 125 mg (03/13/17 1021)   acetaminophen **OR** acetaminophen, acetaminophen-codeine, dianeal solution for CAPD/CCPD with heparin, morphine injection, ondansetron **OR** ondansetron (ZOFRAN) IV, polyethylene glycol  Exam: Aler,t wdwn WF NO jvd Chest clear bilat RRR no mrg ABd soft ntnd no ascites Ext 1+ edema LE's NF, Ox 3 RUA AVG+bruit,  R abd PD cath clean exit site  Dialysis: TTS [facility] 3.5h   48kg  2/2.25   RUE AVG Heparin: 5000 Units IP PRN mircera 137mg IV q2wks -last 10/9 Venofer 1076mIV x5HD - not started  CCPD: 7x wk, Ca 2.5, Mg 0.5, Dext 1.5;2.5;4.25%, #exchange 3, fill vol 200043mdwell time 3hrs,  last fill vol 0mL68may exchange:0 Access: RUQ double cuff PD catheter Heparin: 5000 Units IP PRN  Last Labs: 9/26: K 3.0, 9/5:Ca 8.7, P 6.4, Alb 3.4, 7/24: PTH 184 Hgb: 9/5: 12.7, 9/26: 9.0, 10/4: 8.5, 10/11: 7.4   Assessment:  1. Abdominal pain - fluid studies are suggestive of PD cath related peritonitis. DionWheatonD for now and will get f/u cell count tomorrow am to see if responding to abx ( IV vanc/ fortaz).   2. Symptomatic Anemia: S/p 1Unit pRBC in ED.  FOBT in ED negative.  CT negative for acute process.   ESA given on 10/9.  Starting IV iron qHDx3 and Aranesp 150mc27mk with HD 3. ESRD -  per pt's daughter, pt is supposed to be on hemodialysis for one month trial (2 wks done), then they are going to reassess and decide PD vs HD from there. 4. HTN/volume  - BP better, 120/70. CXR clear. 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 Continue binder.  6. Nutrition - Albumin 3.0. Renal diet and Prostat.  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    Plan - as above   Rob SKelly SplinterarolNewell Rubbermaidr [phone]  03/15/2017, 11:46 AM    Recent Labs Lab 03/13/17 0559 03/14/17 0640 03/15/17 0422  NA 137 133* 134*  K 3.5 3.5 2.9*  CL 98* 98* 96*  CO2 28 27 26   GLUCOSE 142* 121* 187*  BUN 30* 20 27*  CREATININE 3.98* 2.81* 3.19*  CALCIUM 8.2* 7.7* 8.1*  PHOS 3.4 2.6 3.2    Recent Labs Lab 03/10/17 1848  03/13/17 0559 03/14/17 0640 03/15/17 0422  AST 26  --   --   --   --   ALT 24  --   --   --   --   ALKPHOS 77  --   --   --   --   BILITOT 0.5  --   --   --   --   PROT 5.8*  --   --   --   --   ALBUMIN 3.0*  < > 2.7* 2.4* 2.4*  < > = values in this interval not displayed.  Recent Labs Lab 03/11/17 1504 03/12/17 0800 03/13/17 0559 03/14/17 0640 03/15/17 0422  WBC 6.1 6.3 7.2 7.4 5.8  NEUTROABS 4.1 4.3  --   --   --   HGB 9.3* 8.2* 9.6* 8.9* 9.4*  HCT 28.9* 25.2* 31.1* 27.6* 29.2*  MCV 92.6  93.7 95.7 95.8 95.7  PLT 88* 78* 78* 49* 57*   Iron/TIBC/Ferritin/ %Sat    Component Value Date/Time   IRON 48 03/11/2017 0055   TIBC 251 03/11/2017 0055   FERRITIN 1,058 (H) 03/11/2017 0055   IRONPCTSAT 19 03/11/2017 0055

## 2017-03-15 NOTE — Progress Notes (Signed)
PROGRESS NOTE    Samantha Conrad  EHM:[id number] DOB: 1941-01-23 DOA: 03/10/2017 PCP: System, Pcp Not In  Outpatient Specialists:     Brief Narrative:  76 year old female presented with abdominal pain, generalized weakness, and diarrhea x 3 days on 10/14.  PMHx significant for end stage renal disease on peritoneal dialysis (recently switched to hemodialysis, graft in right upper arm), type II DM, calcific pancreatitis, [name], seizures, and CVA.  CT abd/pelvis consistent with known constipation.  Patient was admitted for anemia with a hemoglobin of 7.7.  Nephrology was consulted (Dr. Jonnie Finner) and patient was switched from hemodialysis to peritoneal dialysis with monitoring daily cell counts.  Peritoneal fluid collected, suspicious for peritoneal infection due to elevated WBC.  Pharmacy consulted and IV antibiotics initiated.  Peritoneal fluid culture negative growth.  Assessment & Plan:   Principal Problem:   Symptomatic anemia Active Problems:   Diet-controlled diabetes mellitus (HCC)   NASH (nonalcoholic steatohepatitis)   Hypertension   Hypothyroidism   CKD (chronic kidney disease) stage V requiring chronic dialysis (HCC)   Seizure disorder   Abdominal pain   HLD (hyperlipidemia)   Chronic pancreatitis (HCC)   Constipation   1. Symptomatic anemia - Continue to monitor CBC - hemoglobin 9.4 today (lowest was 7.6 on 10/13). - Received 1 unit of packed red blood cells in ED. - On iron replacement therapy.  Iron panel WNL on 10/14. - FOBT negative on 10/14.    2. Abdominal pain/Constipation - Continue IV antibiotics as per pharmacy recommendations - Peritoneal fluid was initially suggestive of infection.  Culture showed no growth, though patient had already started antibiotic therapy which could have effected culture. - Constipation resolved  3. Diabetes mellitus 2  - Continue to monitor CBG (172 this morning) - Continue linagliptin and novolog - HgbA1c 6.8 on 10/14  4.  Hypertension - Hold preadmission Norvasc and Cozaar as patient's diastolic pressures have been in 40-60s throughout admission -Continue to monitor  5. Chronic kidney disease stage V, requiring chronic dialysis  - Nephrology consulted (Dr. Jonnie Finner).  Patient was switched from hemodialysis to peritoneal dialysis with daily cell counts.  - Potassium of 2.9 this morning. Avoid supplemental K due to ESRD and patient is asymptomatic. Will continue to monitor.  6. Chronic pancreatitis  - Continue pancreatic enzymes - Patient continues to be asymptomatic. Lipase was 12 on 10/13  7. NASH (nonalcoholic steatohepatitis) - No ascites on exam, LFTs unremarkable on 10/13  8. Hypothyroidism - Continue Synthroid daily - TSH 4.262 on 10/14  9. Seizure disorder/history of CVA - Continue Keppra BID  10. Hyperlipidemia - Continue atorvastatin daily  11. Thrombocytopenia - Unknown etiology.  Possible medication side effect vs. liver dysfunction vs. Hyper-anticoagulation - No signs of active bleeding. - Avoid anticoagulants - Monitor cell count: platelets increased to 57 today  DVT prophylaxis: SCDs Code Status: Full Family Communication: Discussed with patient Disposition Plan: Discharge to home   Consultants:   Nephrology (Dr. Roney Jaffe)  Pharmacology [facility] [place], [facility])  Nutrition [facility] [place], [facility])  Procedures:   Peritoneal dialysis - daily  Antimicrobials:   IV ceftazidime 2 g at 100 mL/hr: every T-Th-Sa (1800) over 30 Minutes (after dialysis)  IV vancomycin 500 mg at 100 mL/hr: every T-Th-Sa (Hemodialysis) over 60 Minutes (after dialysis)  Topical gentamycin cream 0.1% for peritoneal exit site   Subjective: Patient states she is doing well today. She has been sitting up right in hospital bed as she is "sick of lying down".  Patient states  her abdominal pain has improved and she denies nausea/vomiting, heartburn, or reflux.  Patient has been able to tolerate  solid foods and liquids.  Reports several, soft BMs daily - denies pain, blood.  Patient has no questions at this time.  Objective: Vitals:   03/14/17 1300 03/14/17 1826 03/14/17 2201 03/15/17 0553  BP: (!) 118/41 (!) 126/45 (!) 125/45 (!) 136/53  Pulse: 67 67 66 71  Resp: 18 20 16 16   Temp: 97.7 F (36.5 C)  98.6 F (37 C) 97.8 F (36.6 C)  TempSrc: Oral Oral Oral Oral  SpO2: 100% 100% 100% 100%  Weight:  46.9 kg (103 lb 6.3 oz)    Height:        Intake/Output Summary (Last 24 hours) at 03/15/17 1006 Last data filed at 03/14/17 1300  Gross per 24 hour  Intake              180 ml  Output                0 ml  Net              180 ml   Filed Weights   03/13/17 1126 03/14/17 0645 03/14/17 1826  Weight: 47.3 kg (104 lb 4.4 oz) 51 kg (112 lb 7 oz) 46.9 kg (103 lb 6.3 oz)    Examination:  General exam: Appears calm and comfortable, no acute distress.  Up in room, ambulating independently. Respiratory system: Clear to auscultation bilaterally. Respiratory effort normal. Cardiovascular system: S1 & S2 heard, RRR. No JVD, murmurs, rubs, gallops or clicks. No pedal edema. Gastrointestinal system: Abdomen is thin, nondistended, soft and nontender. Peritoneal exit drain is dressed, dry, and intact - mild TTP around drain site.  No signs of leak, edema, warmth, or erythema.  No organomegaly or masses felt. Normal bowel sounds heard.   Genitourinary system: No CVA tenderness. Central nervous system: Alert and oriented. No focal neurological deficits. Extremities: Symmetric 5 x 5 power.  SCDs in place.  Right bicipital fistula intact and dressed.  Palpable thrill both proximal and distal to graft. Skin: No rashes, lesions or ulcers Psychiatry: Judgement and insight appear normal. Mood & affect appropriate.     Data Reviewed: I have personally reviewed following labs and imaging studies  CBC:  Recent Labs Lab 03/11/17 1504 03/12/17 0800 03/13/17 0559 03/14/17 0640 03/15/17 0422    WBC 6.1 6.3 7.2 7.4 5.8  NEUTROABS 4.1 4.3  --   --   --   HGB 9.3* 8.2* 9.6* 8.9* 9.4*  HCT 28.9* 25.2* 31.1* 27.6* 29.2*  MCV 92.6 93.7 95.7 95.8 95.7  PLT 88* 78* 78* 49* 57*   Basic Metabolic Panel:  Recent Labs Lab 03/11/17 1504 03/12/17 0800 03/13/17 0559 03/14/17 0640 03/15/17 0422  NA 136 139 137 133* 134*  K 3.8 4.1 3.5 3.5 2.9*  CL 102 106 98* 98* 96*  CO2 24 22 28 27 26   GLUCOSE 222* 125* 142* 121* 187*  BUN 49* 55* 30* 20 27*  CREATININE 5.19* 5.48* 3.98* 2.81* 3.19*  CALCIUM 7.9* 7.9* 8.2* 7.7* 8.1*  PHOS 5.0* 5.1* 3.4 2.6 3.2   GFR: Estimated Creatinine Clearance: 10.9 mL/min (A) (by C-G formula based on SCr of 3.19 mg/dL (H)). Liver Function Tests:  Recent Labs Lab 03/10/17 1848 03/11/17 1504 03/12/17 0800 03/13/17 0559 03/14/17 0640 03/15/17 0422  AST 26  --   --   --   --   --   ALT 24  --   --   --   --   --  ALKPHOS 77  --   --   --   --   --   BILITOT 0.5  --   --   --   --   --   PROT 5.8*  --   --   --   --   --   ALBUMIN 3.0* 2.7* 2.3* 2.7* 2.4* 2.4*    Recent Labs Lab 03/10/17 1848  LIPASE 12   No results for input(s): AMMONIA in the last 168 hours. Coagulation Profile:  Recent Labs Lab 03/11/17 0055  INR 1.02   Cardiac Enzymes: No results for input(s): CKTOTAL, CKMB, CKMBINDEX, TROPONINI in the last 168 hours. BNP (last 3 results) No results for input(s): PROBNP in the last 8760 hours. HbA1C: No results for input(s): HGBA1C in the last 72 hours. CBG:  Recent Labs Lab 03/14/17 0732 03/14/17 1153 03/14/17 1643 03/14/17 2200 03/15/17 0752  GLUCAP 124* 210* 115* 217* 172*   Lipid Profile: No results for input(s): CHOL, HDL, LDLCALC, TRIG, CHOLHDL, LDLDIRECT in the last 72 hours. Thyroid Function Tests: No results for input(s): TSH, T4TOTAL, FREET4, T3FREE, THYROIDAB in the last 72 hours. Anemia Panel: No results for input(s): VITAMINB12, FOLATE, FERRITIN, TIBC, IRON, RETICCTPCT in the last 72 hours. Urine  analysis:    Component Value Date/Time   COLORURINE YELLOW 03/11/2017 2008   APPEARANCEUR CLEAR 03/11/2017 2008   LABSPEC 1.007 03/11/2017 2008   PHURINE 7.0 03/11/2017 2008   GLUCOSEU >=500 (A) 03/11/2017 2008   HGBUR NEGATIVE 03/11/2017 2008   [place] NEGATIVE 03/11/2017 2008   [place] 03/11/2017 2008   PROTEINUR 30 (A) 03/11/2017 2008   NITRITE NEGATIVE 03/11/2017 2008   LEUKOCYTESUR SMALL (A) 03/11/2017 2008   Sepsis Labs: @LABRCNTIP (procalcitonin:4,lacticidven:4)  ) Recent Results (from the past 240 hour(s))  MRSA PCR Screening     Status: None   Collection Time: 03/11/17  3:54 PM  Result Value Ref Range Status   MRSA by PCR NEGATIVE NEGATIVE Final    Comment:        The GeneXpert MRSA Assay (FDA approved for NASAL specimens only), is one component of a comprehensive MRSA colonization surveillance program. It is not intended to diagnose MRSA infection nor to guide or monitor treatment for MRSA infections.   Body fluid culture     Status: None (Preliminary result)   Collection Time: 03/13/17  2:00 PM  Result Value Ref Range Status   Specimen Description PERITONEAL DIALYSATE  Final   Special Requests Immunocompromised  Final   Gram Stain   Final    FEW WBC PRESENT,BOTH PMN AND MONONUCLEAR NO ORGANISMS SEEN    Culture NO GROWTH < 24 HOURS  Final   Report Status PENDING  Incomplete         Radiology Studies: No results found.      Scheduled Meds: . atorvastatin  20 mg Oral QHS  . darbepoetin (ARANESP) injection - DIALYSIS  150 mcg Intravenous Q Tue-HD  . feeding supplement (NEPRO CARB STEADY)  237 mL Oral BID BM  . ferric citrate  420 mg Oral TID WC  . gentamicin cream  1 application Topical Daily  . insulin aspart  0-5 Units Subcutaneous QHS  . insulin aspart  0-9 Units Subcutaneous TID WC  . levETIRAcetam  250 mg Oral Q12H  . levothyroxine  100 mcg Oral QAC breakfast  . linagliptin  5 mg Oral Daily  . lipase/protease/amylase   48,000 Units Oral TID WC  . multivitamin  1 tablet Oral Daily  . polyethylene  glycol  17 g Oral BID  . potassium chloride  40 mEq Oral BID  . prednisoLONE acetate  1 drop Both Eyes QHS  . senna-docusate  2 tablet Oral BID  . sodium chloride flush  3 mL Intravenous Q12H  . traZODone  50 mg Oral QHS   Continuous Infusions: . cefTAZidime (FORTAZ)  IV    . dialysis solution 1.5% low-MG/low-CA    . ferric gluconate (FERRLECIT/NULECIT) IV 125 mg (03/13/17 1021)  . vancomycin       LOS: 4 days      Tanzania Hall-Potvin, PA-S [facility] Pager [phone]  If 7PM-7AM, please contact night-coverage [email or web address] Password [email or web address] 03/15/2017, 10:06 AM

## 2017-03-16 LAB — RENAL FUNCTION PANEL
ALBUMIN: 2.4 g/dL — AB (ref 3.5–5.0)
Anion gap: 8 (ref 5–15)
BUN: 23 mg/dL — AB (ref 6–20)
CALCIUM: 8.2 mg/dL — AB (ref 8.9–10.3)
CO2: 28 mmol/L (ref 22–32)
CREATININE: 3.24 mg/dL — AB (ref 0.44–1.00)
Chloride: 100 mmol/L — ABNORMAL LOW (ref 101–111)
GFR, EST AFRICAN AMERICAN: 15 mL/min — AB (ref >60)
GFR, EST NON AFRICAN AMERICAN: 13 mL/min — AB (ref >60)
Glucose, Bld: 258 mg/dL — ABNORMAL HIGH (ref 65–99)
Phosphorus: 2.6 mg/dL (ref 2.5–4.6)
Potassium: 3.5 mmol/L (ref 3.5–5.1)
SODIUM: 136 mmol/L (ref 135–145)

## 2017-03-16 LAB — BODY FLUID CELL COUNT WITH DIFFERENTIAL
Lymphs, Fluid: 7 %
Monocyte-Macrophage-Serous Fluid: 16 % — ABNORMAL LOW (ref 50–90)
Neutrophil Count, Fluid: 77 % — ABNORMAL HIGH (ref 0–25)
Total Nucleated Cell Count, Fluid: 112 cu mm (ref 0–1000)

## 2017-03-16 LAB — CBC
HCT: 29.6 % — ABNORMAL LOW (ref 36.0–46.0)
Hemoglobin: 9.1 g/dL — ABNORMAL LOW (ref 12.0–15.0)
MCH: 30.4 pg (ref 26.0–34.0)
MCHC: 30.7 g/dL (ref 30.0–36.0)
MCV: 99 fL (ref 78.0–100.0)
PLATELETS: 58 10*3/uL — AB (ref 150–400)
RBC: 2.99 MIL/uL — AB (ref 3.87–5.11)
RDW: 17.8 % — AB (ref 11.5–15.5)
WBC: 5.8 10*3/uL (ref 4.0–10.5)

## 2017-03-16 LAB — BASIC METABOLIC PANEL
ANION GAP: 6 (ref 5–15)
BUN: 24 mg/dL — ABNORMAL HIGH (ref 6–20)
CALCIUM: 8.3 mg/dL — AB (ref 8.9–10.3)
CHLORIDE: 101 mmol/L (ref 101–111)
CO2: 29 mmol/L (ref 22–32)
CREATININE: 3.28 mg/dL — AB (ref 0.44–1.00)
GFR calc Af Amer: 15 mL/min — ABNORMAL LOW (ref >60)
GFR calc non Af Amer: 13 mL/min — ABNORMAL LOW (ref >60)
Glucose, Bld: 262 mg/dL — ABNORMAL HIGH (ref 65–99)
POTASSIUM: 3.5 mmol/L (ref 3.5–5.1)
SODIUM: 136 mmol/L (ref 135–145)

## 2017-03-16 LAB — GLUCOSE, CAPILLARY
GLUCOSE-CAPILLARY: 158 mg/dL — AB (ref 65–99)
Glucose-Capillary: 99 mg/dL (ref 65–99)

## 2017-03-16 LAB — PATHOLOGIST SMEAR REVIEW

## 2017-03-16 MED ORDER — NEPRO/CARBSTEADY PO LIQD: 237.0000 mL | ORAL | 0 refills | Status: AC

## 2017-03-16 MED ORDER — SODIUM CHLORIDE 0.9 % IV SOLN: 500.0000 mg | INTRAVENOUS | Status: DC | PRN

## 2017-03-16 MED ORDER — SODIUM CHLORIDE 0.9 % IV SOLN: 125.0000 mg | INTRAVENOUS | Status: DC

## 2017-03-16 MED ORDER — PRO-STAT SUGAR FREE PO LIQD: 30.0000 mL | Freq: Two times a day (BID) | ORAL | 0 refills | Status: AC

## 2017-03-16 MED ORDER — DARBEPOETIN ALFA 150 MCG/0.3ML IJ SOSY: 150.0000 ug | PREFILLED_SYRINGE | INTRAMUSCULAR | Status: DC

## 2017-03-16 MED ORDER — NEPRO/CARBSTEADY PO LIQD: 237.0000 mL | ORAL | Status: DC

## 2017-03-16 MED ORDER — DEXTROSE 5 % IV SOLN: INTRAVENOUS | Status: DC

## 2017-03-16 MED ORDER — PROSIGHT PO TABS: 1.0000 | ORAL_TABLET | Freq: Every day | ORAL | 0 refills | Status: AC

## 2017-03-16 MED ORDER — PRO-STAT SUGAR FREE PO LIQD: 30.0000 mL | Freq: Two times a day (BID) | ORAL | Status: DC

## 2017-03-16 MED ORDER — DEXTROSE 5 % IV SOLN: 500.0000 mg | INTRAVENOUS | Status: DC

## 2017-03-16 NOTE — Progress Notes (Addendum)
[facility] Progress Note  Subjective: abd pain better, awaiting cell count on PD fluid this am.   Vitals:   03/15/17 2238 03/16/17 0440 03/16/17 0445 03/16/17 0720  BP: (!) 124/35 (!) 108/29 (!) 108/29 (!) 116/35  Pulse: 70 64 64 69  Resp: 16 18 18 18   Temp: 98.7 F (37.1 C) 97.9 F (36.6 C) 97.9 F (36.6 C) 98.2 F (36.8 C)  TempSrc: Oral Oral Oral Oral  SpO2: 100% 97% 97% 97%  Weight:   53.2 kg (117 lb 4.8 oz) 51.9 kg (114 lb 6.7 oz)  Height:        Inpatient medications: . atorvastatin  20 mg Oral QHS  . darbepoetin (ARANESP) injection - DIALYSIS  150 mcg Intravenous Q Tue-HD  . feeding supplement (NEPRO CARB STEADY)  237 mL Oral BID BM  . ferric citrate  420 mg Oral TID WC  . gentamicin cream  1 application Topical Daily  . insulin aspart  0-5 Units Subcutaneous QHS  . insulin aspart  0-9 Units Subcutaneous TID WC  . levETIRAcetam  250 mg Oral Q12H  . levothyroxine  100 mcg Oral QAC breakfast  . linagliptin  5 mg Oral Daily  . lipase/protease/amylase  48,000 Units Oral TID WC  . multivitamin  1 tablet Oral Daily  . prednisoLONE acetate  1 drop Both Eyes QHS  . sodium chloride flush  3 mL Intravenous Q12H  . traZODone  50 mg Oral QHS   . cefTAZidime (FORTAZ)  IV Stopped (03/15/17 1453)  . dialysis solution 1.5% low-MG/low-CA    . ferric gluconate (FERRLECIT/NULECIT) IV 125 mg (03/13/17 1021)   acetaminophen **OR** acetaminophen, acetaminophen-codeine, dianeal solution for CAPD/CCPD with heparin, morphine injection, ondansetron **OR** ondansetron (ZOFRAN) IV, polyethylene glycol  Exam: Aler,t wdwn WF NO jvd Chest clear bilat RRR no mrg ABd soft ntnd no ascites Ext 1+ edema LE's NF, Ox 3 RUA AVG+bruit,  R abd PD cath clean exit site  Dialysis: T[facility] 3.5h   48kg  2/2.25   RUE AVG Heparin: 5000 Units IP PRN mircera 152mg IV q2wks -last 10/9 Venofer 1062mIV x5HD - not started  CCPD: 7x wk, Ca 2.5, Mg 0.5, Dext 1.5;2.5;4.25%, #exchange 3,  fill vol 200015mdwell time 3hrs, last fill vol 0mL30may exchange:0 Access: RUQ double cuff PD catheter Heparin: 5000 Units IP PRN  Last Labs: 9/26: K 3.0, 9/5:Ca 8.7, P 6.4, Alb 3.4, 7/24: PTH 184 Hgb: 9/5: 12.7, 9/26: 9.0, 10/4: 8.5, 10/11: 7.4   Assessment:  1. Abdominal pain - I think she has PD cath -related peritonitis.  She is on D#3 of IV abx for this, cultures are negative but she was on antibiotics before they were sent.  Anticipipate we will treat empirically with total of 2 wks IV abx at hemodialysis.  Awaiting today's fluid cell count to see if improved. Should be back late morning.  2. Symptomatic Anemia: S/p 1Unit pRBC in ED.  FOBT in ED negative.  CT negative for acute process.   ESA given on 10/9.  Starting IV iron qHDx3 and Aranesp [id number] with HD 3. ESRD -  upon discharge, pt is to resume outpatient hemodialysis (not peritoneal dialysis) according to the previous plan/ trial of HD that was agreed upon between [facility], pt's family, and the [facility]. D/W patient who is agreeable to resume OP HD tomorrow on Sat.  4. HTN/volume  - BP better, 120/70. CXR clear. 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 Continue binder.  6. Nutrition - Albumin 3.0. Renal diet and Prostat.  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    Plan - as above   Kelly Splinter MD [facility] pager [phone]   03/16/2017, 10:02 AM    Recent Labs Lab 03/14/17 0640 03/15/17 0422 03/16/17 0500  NA 133* 134* 136  136  K 3.5 2.9* 3.5  3.5  CL 98* 96* 100*  101  CO2 27 26 28  29   GLUCOSE 121* 187* 258*  262*  BUN 20 27* 23*  24*  CREATININE 2.81* 3.19* 3.24*  3.28*  CALCIUM 7.7* 8.1* 8.2*  8.3*  PHOS 2.6 3.2 2.6    Recent Labs Lab 03/10/17 1848  03/14/17 0640 03/15/17 0422 03/16/17 0500  AST 26  --   --   --   --   ALT 24  --   --   --   --   ALKPHOS 77  --   --   --   --   BILITOT 0.5  --   --   --   --    PROT 5.8*  --   --   --   --   ALBUMIN 3.0*  < > 2.4* 2.4* 2.4*  < > = values in this interval not displayed.  Recent Labs Lab 03/11/17 1504 03/12/17 0800  03/14/17 0640 03/15/17 0422 03/16/17 0500  WBC 6.1 6.3  < > 7.4 5.8 5.8  NEUTROABS 4.1 4.3  --   --   --   --   HGB 9.3* 8.2*  < > 8.9* 9.4* 9.1*  HCT 28.9* 25.2*  < > 27.6* 29.2* 29.6*  MCV 92.6 93.7  < > 95.8 95.7 99.0  PLT 88* 78*  < > 49* 57* 58*  < > = values in this interval not displayed. Iron/TIBC/Ferritin/ %Sat    Component Value Date/Time   IRON 48 03/11/2017 0055   TIBC 251 03/11/2017 0055   FERRITIN 1,058 (H) 03/11/2017 0055   IRONPCTSAT 19 03/11/2017 0055

## 2017-03-16 NOTE — Progress Notes (Signed)
Malcolm Metro [place] to be D/C'd Home per MD order.  Discussed with the patient and all questions fully answered.  VSS, Skin clean, dry and intact without evidence of skin break down, no evidence of skin tears noted. IV catheter discontinued intact. Site without signs and symptoms of complications. Dressing and pressure applied.  An After Visit Summary was printed and given to the patient. Patient received prescription.  D/c education completed with patient/family including follow up instructions, medication list, d/c activities limitations if indicated, with other d/c instructions as indicated by MD - patient able to verbalize understanding, all questions fully answered.   Patient instructed to return to ED, call 911, or call MD for any changes in condition.   Patient escorted via [place], and D/C home via private auto.  Dorris Carnes 03/16/2017 4:10 PM

## 2017-03-16 NOTE — Discharge Summary (Signed)
Physician Discharge Summary  Samantha Conrad  TIW:[id number]  DOB: Nov 28, 1940  DOA: 03/10/2017 PCP: System, Pcp Not In  Admit date: 03/10/2017 Discharge date: 03/16/2017  Admitted From: Home Disposition:  Discharge home  Recommendations for Outpatient Follow-up:  1. Follow up with PCP in 1-2 weeks 2. Follow up with Nephrology in 1-2 weeks. 3. Please obtain BMP/CBC in one week to monitor renal function and hemoglobin. 4. Vancomycin/Fortaz w/ dialysis  Home Health: None Equipment/Devices: None   Discharge Condition: Stable CODE STATUS: Full  Diet recommendation: Heart healthy/renal  Brief/Interim Summary: 76 year old female presented with abdominal pain, generalized weakness, and diarrhea x 3 days on 10/14.  PMHx significant for end stage renal disease on peritoneal dialysis (recently switched to hemodialysis, graft in right upper arm), type II DM, calcific pancreatitis, [name], seizures, and CVA.  CT abd/pelvis consistent with known constipation.  Patient was admitted for anemia with a hemoglobin of 7.7.  Nephrology was consulted (Dr. Jonnie Finner) and patient was switched from hemodialysis to peritoneal dialysis with monitoring daily cell counts.  Peritoneal fluid collected, suspicious for peritoneal infection due to elevated WBC.  Pharmacy consulted and IV antibiotics initiated.  Peritoneal fluid culture negative growth.  Peritoneal cell count on 10/19 demonstrated WBC WNL (112).   Subjective: Patient is doing well today.  Her abdominal pain is focal to her peritoneal drain exit and only tender when she "presses on it".  She denies lightheadedness/dizziness, fever, fatigue, nausea, vomiting, constipation, diarrhea, dysuria.  She is able to get in/out of bed and ambulate independently.  She is looking forward to going home with her son.  Patient lives with her husband.  Discharge Diagnoses/Hospital Course:  1. Symptomatic anemia Hemoglobin 7.7 on presentation, received 1 unit of packed  RBC in ED.  Patient currently on iron replacement therapy - iron panel WNL on 10/14.  FOBT negative on 10/14.  Hemoglobin 9.1 at discharge.  Recommend patient follow up with PCP.  2. Peritonitis Peritoneal catheter peritonitis infection source of abdominal pain as peritoneal fluid was suggestive of infection.  Culture showed no growth, though patient had already started antibiotic therapy which could have effected culture.  Peritoneal fluid cell count today WNL (WBC 112).  Pain largely resolved at time of discharge.  3. Diabetes mellitus 2  Patient's HgbA1c 6.8 on 10/14.  Daily linagliptin and Novolog administered throughout admission. Slightly hyperglycemic throughout admission.  Recommend patient follow up with PCP.  4. Hypertension Patient's BP 116/41 on presentation to ED.  Home medications were held as patient's diastolic pressures were 25-05 throughout admission.  116/35 morning of discharge.  No signs/symptoms of orthostatic hypotension.  Recommended patient hold home BP medications until seen by PCP.  5. Chronic kidney disease stage V, requiring chronic dialysis  Nephrology consulted (Dr. Jonnie Finner).  Patient was switched from hemodialysis to peritoneal dialysis with daily cell counts.  - Potassium of 2.9 this morning. Will monitor due to ESRD and patient is asymptomatic. K levels will increase on it own. Check BMP in 1 week  6. Chronic pancreatitis  CT abd/pelvis on 10/14 showed pancreatic calcifications consistent w/ known chronic pancreatitis.  Patient was asymptomatic throughout admission. Lipase was 12 on 10/13.    7. NASH (nonalcoholic steatohepatitis) - No ascites on exam, LFTs unremarkable on 10/13.  8. Hypothyroidism Patient's TSH was 4.262 on 10/14.  Given daily Synthroid.  Recommend patient follows up with PCP.  9. Seizure disorder/history of CVA No signs of seizure activity throughout admission.  Home medication (Keppra BID) was continued  throughout  admission.  10. Hyperlipidemia Home med (atorvastatin daily) was continued through admission.  11. Thrombocytopenia Platelet count 95 on presentation to ED.  Dropped to 49 on 10/17.  Increased to 58 morning of discharge.  No signs of active bleeding throughout admission.  Anticoagulants were avoided.  LFTs were unremarkable on 10/13.  Recommended patient follow up with PCP.  12. Constipation Patient constipated at baseline, resolved with bowel regimen.    Principal Problem:   Symptomatic anemia Active Problems:   Diet-controlled diabetes mellitus (HCC)   NASH (nonalcoholic steatohepatitis)   Hypertension   Hypothyroidism   CKD (chronic kidney disease) stage V requiring chronic dialysis (HCC)   Seizure disorder   Abdominal pain   HLD (hyperlipidemia)   Chronic pancreatitis ([place])   Constipation    All other chronic medical condition were stable during the hospitalization.  On the day of the discharge the patient's vitals were stable, and no other acute medical condition were reported by patient. the patient was felt safe to be discharge home.  Discharge Instructions  You were cared for by a hospitalist during your hospital stay. If you have any questions about your discharge medications or the care you received while you were in the hospital after you are discharged, you can call the unit and asked to speak with the hospitalist on call if the hospitalist that took care of you is not available. Once you are discharged, your primary care physician will handle any further medical issues. Please note that NO REFILLS for any discharge medications will be authorized once you are discharged, as it is imperative that you return to your primary care physician (or establish a relationship with a primary care physician if you do not have one) for your aftercare needs so that they can reassess your need for medications and monitor your lab values.  Discharge Instructions    Call MD for:   difficulty breathing, headache or visual disturbances    Complete by:  As directed    Call MD for:  extreme fatigue    Complete by:  As directed    Call MD for:  hives    Complete by:  As directed    Call MD for:  persistant dizziness or light-headedness    Complete by:  As directed    Call MD for:  persistant nausea and vomiting    Complete by:  As directed    Call MD for:  redness, tenderness, or signs of infection (pain, swelling, redness, odor or green/yellow discharge around incision site)    Complete by:  As directed    Call MD for:  severe uncontrolled pain    Complete by:  As directed    Call MD for:  temperature >100.4    Complete by:  As directed    Diet - low sodium heart healthy    Complete by:  As directed    Increase activity slowly    Complete by:  As directed      Allergies as of 03/16/2017      Reactions   Contrast Media [iodinated Diagnostic Agents] Swelling   SWELLING REACTION UNSPECIFIED       Medication List    STOP taking these medications   acetaminophen 325 MG tablet Commonly known as:  TYLENOL   amLODipine 10 MG tablet Commonly known as:  NORVASC   aspirin EC 81 MG tablet   loperamide 2 MG capsule Commonly known as:  IMODIUM   losartan 25 MG tablet Commonly known  as:  COZAAR   multivitamin capsule     TAKE these medications   acetaminophen-codeine 300-30 MG tablet Commonly known as:  TYLENOL #3 Take 1-2 tablets by mouth every 6 (six) hours as needed for moderate pain or severe pain.   AURYXIA 1 GM 210 MG(Fe) tablet Generic drug:  ferric citrate Take 2 tablets by mouth 3 (three) times daily with meals.   bisacodyl 10 MG suppository Commonly known as:  DULCOLAX Place 10 mg rectally daily as needed (for constipation.).   cefTAZidime 500 mg in dextrose 5 % 50 mL Inject 500 mg into the vein daily.   CREON 24000-76000 units Cpep Generic drug:  Pancrelipase (Lip-Prot-Amyl) Take 2 capsules by mouth 3 (three) times daily with meals.    Darbepoetin Alfa 150 MCG/0.3ML Sosy injection Commonly known as:  ARANESP Inject 0.3 mLs (150 mcg total) into the vein every Tuesday with hemodialysis.   feeding supplement (NEPRO CARB STEADY) Liqd Take 237 mLs by mouth daily.   feeding supplement (PRO-STAT SUGAR FREE 64) Liqd Take 30 mLs by mouth 2 (two) times daily.   ferric gluconate 125 mg in sodium chloride 0.9 % 100 mL Inject 125 mg into the vein Every Tuesday,Thursday,and Saturday with dialysis.   levETIRAcetam 250 MG tablet Commonly known as:  KEPPRA Take 250 mg by mouth every 12 (twelve) hours. With breakfast & with supper.   levothyroxine 100 MCG tablet Commonly known as:  SYNTHROID, LEVOTHROID Take 100 mcg by mouth daily before breakfast.   linagliptin 5 MG Tabs tablet Commonly known as:  TRADJENTA Take 5 mg by mouth daily.   multivitamin Tabs tablet Take 1 tablet by mouth daily.   nitroGLYCERIN 0.4 MG SL tablet Commonly known as:  NITROSTAT Place 0.4 mg under the tongue every 5 (five) minutes as needed for chest pain.   polyethylene glycol packet Commonly known as:  MIRALAX / GLYCOLAX Take 17 g by mouth daily as needed for mild constipation (for constipation).   prednisoLONE acetate 1 % ophthalmic suspension Commonly known as:  PRED FORTE Place 1 drop into both eyes at bedtime.   traZODone 50 MG tablet Commonly known as:  DESYREL Take 50 mg by mouth at bedtime.   vancomycin 500 mg in sodium chloride 0.9 % 100 mL Inject 500 mg into the vein every dialysis.       Allergies  Allergen Reactions  . Contrast Media [Iodinated Diagnostic Agents] Swelling    SWELLING REACTION UNSPECIFIED     Consultations:  Nephrology (Dr. Roney Jaffe)  Pharmacology [facility] Houseman [place], [facility])  Nutrition [facility])   Procedures/Studies: Ct Abdomen Pelvis Wo Contrast  Result Date: 03/11/2017 CLINICAL DATA:  Generalized abdominal pain. EXAM: CT ABDOMEN AND PELVIS WITHOUT CONTRAST TECHNIQUE: Multidetector CT  imaging of the abdomen and pelvis was performed following the standard protocol without IV contrast. COMPARISON:  01/24/2011 FINDINGS: Lower chest: Mild linear scarring or atelectasis in the bases. No consolidation. No effusion. Hepatobiliary: No focal liver abnormality is seen. Status post cholecystectomy. No biliary dilatation. Pancreas: Diffuse parenchymal pancreatic calcifications consistent with chronic pancreatitis. No acute inflammation. No duct dilatation. Spleen: Normal in size without focal abnormality. Adrenals/Urinary Tract: Both adrenals are normal. Mildly atrophic kidneys. No suspicious renal parenchymal lesions. Urinary bladder is unremarkable. Stomach/Bowel: Stomach is within normal limits. Appendix appears normal. Enteric contrast has reached the cecum. No inflammation or obstruction of bowel. Large volume colonic stool without evidence of colonic obstruction. Vascular/Lymphatic: The abdominal aorta is normal in caliber with extensive atherosclerotic calcification. No adenopathy in  the abdomen or pelvis. Reproductive: Status post hysterectomy. No adnexal masses. Other: Peritoneal catheter noted.  No ascites. Musculoskeletal: No significant skeletal lesion. IMPRESSION: 1. No acute findings are evident in the abdomen or pelvis. 2. Pancreatic calcifications consistent with chronic pancreatitis. 3. Aortic atherosclerosis. 4. Large volume colonic stool. No evidence of obstruction or inflammation of bowel. Electronically Signed   By: Andreas Newport M.D.   On: 03/11/2017 03:39   Dg Chest 2 View  Result Date: 03/11/2017 CLINICAL DATA:  Initial evaluation for acute abdominal pain, dialysis. EXAM: CHEST  2 VIEW COMPARISON:  Prior radiograph from 09/28/2016. FINDINGS: Transverse heart size within normal limits. Mediastinal silhouette normal. Aortic atherosclerosis. Lungs mildly hypoinflated. Blunting of the costophrenic angles, similar to previous, which may reflect tiny effusions versus chronic  pleural reaction/ scarring. No focal infiltrates. No pulmonary edema. No pneumothorax. No acute osseus abnormality. Sequelae of prior rotator cuff repair present at the left humeral head. IMPRESSION: 1. Mild blunting of the costophrenic angles, which may reflect tiny pleural effusions versus chronic pleural reaction/scarring. 2. No other acute cardiopulmonary abnormality. 3. Aortic atherosclerosis. Electronically Signed   By: Jeannine Boga M.D.   On: 03/11/2017 05:18      Discharge Exam: Vitals:   03/16/17 0445 03/16/17 0720  BP: (!) 108/29 (!) 116/35  Pulse: 64 69  Resp: 18 18  Temp: 97.9 F (36.6 C) 98.2 F (36.8 C)  SpO2: 97% 97%   Vitals:   03/15/17 2238 03/16/17 0440 03/16/17 0445 03/16/17 0720  BP: (!) 124/35 (!) 108/29 (!) 108/29 (!) 116/35  Pulse: 70 64 64 69  Resp: 16 18 18 18   Temp: 98.7 F (37.1 C) 97.9 F (36.6 C) 97.9 F (36.6 C) 98.2 F (36.8 C)  TempSrc: Oral Oral Oral Oral  SpO2: 100% 97% 97% 97%  Weight:   53.2 kg (117 lb 4.8 oz) 51.9 kg (114 lb 6.7 oz)  Height:        General: Pt is alert, awake, not in acute distress Cardiovascular: RRR, S1/S2 +, no rubs, no gallops Respiratory: CTA bilaterally, no wheezing, no rhonchi Abdominal: Soft, NT, ND, bowel sounds + Extremities: no edema, no cyanosis    The results of significant diagnostics from this hospitalization (including imaging, microbiology, ancillary and laboratory) are listed below for reference.     Microbiology: Recent Results (from the past 240 hour(s))  MRSA PCR Screening     Status: None   Collection Time: 03/11/17  3:54 PM  Result Value Ref Range Status   MRSA by PCR NEGATIVE NEGATIVE Final    Comment:        The GeneXpert MRSA Assay (FDA approved for NASAL specimens only), is one component of a comprehensive MRSA colonization surveillance program. It is not intended to diagnose MRSA infection nor to guide or monitor treatment for MRSA infections.   Body fluid culture      Status: None (Preliminary result)   Collection Time: 03/13/17  2:00 PM  Result Value Ref Range Status   Specimen Description PERITONEAL DIALYSATE  Final   Special Requests Immunocompromised  Final   Gram Stain   Final    FEW WBC PRESENT,BOTH PMN AND MONONUCLEAR NO ORGANISMS SEEN    Culture NO GROWTH 3 DAYS  Final   Report Status PENDING  Incomplete     Labs: BNP (last 3 results) No results for input(s): BNP in the last 8760 hours. Basic Metabolic Panel:  Recent Labs Lab 03/12/17 0800 03/13/17 0559 03/14/17 0640 03/15/17 0422 03/16/17 0500  NA 139 137 133* 134* 136  136  K 4.1 3.5 3.5 2.9* 3.5  3.5  CL 106 98* 98* 96* 100*  101  CO2 22 28 27 26 28  29   GLUCOSE 125* 142* 121* 187* 258*  262*  BUN 55* 30* 20 27* 23*  24*  CREATININE 5.48* 3.98* 2.81* 3.19* 3.24*  3.28*  CALCIUM 7.9* 8.2* 7.7* 8.1* 8.2*  8.3*  PHOS 5.1* 3.4 2.6 3.2 2.6   Liver Function Tests:  Recent Labs Lab 03/10/17 1848  03/12/17 0800 03/13/17 0559 03/14/17 0640 03/15/17 0422 03/16/17 0500  AST 26  --   --   --   --   --   --   ALT 24  --   --   --   --   --   --   ALKPHOS 77  --   --   --   --   --   --   BILITOT 0.5  --   --   --   --   --   --   PROT 5.8*  --   --   --   --   --   --   ALBUMIN 3.0*  < > 2.3* 2.7* 2.4* 2.4* 2.4*  < > = values in this interval not displayed.  Recent Labs Lab 03/10/17 1848  LIPASE 12   No results for input(s): AMMONIA in the last 168 hours. CBC:  Recent Labs Lab 03/11/17 1504 03/12/17 0800 03/13/17 0559 03/14/17 0640 03/15/17 0422 03/16/17 0500  WBC 6.1 6.3 7.2 7.4 5.8 5.8  NEUTROABS 4.1 4.3  --   --   --   --   HGB 9.3* 8.2* 9.6* 8.9* 9.4* 9.1*  HCT 28.9* 25.2* 31.1* 27.6* 29.2* 29.6*  MCV 92.6 93.7 95.7 95.8 95.7 99.0  PLT 88* 78* 78* 49* 57* 58*   Cardiac Enzymes: No results for input(s): CKTOTAL, CKMB, CKMBINDEX, TROPONINI in the last 168 hours. BNP: Invalid input(s): POCBNP CBG:  Recent Labs Lab 03/15/17 1235  03/15/17 1657 03/15/17 2255 03/16/17 0759 03/16/17 1158  GLUCAP 135* 175* 257* 158* 99   D-Dimer No results for input(s): DDIMER in the last 72 hours. Hgb A1c No results for input(s): HGBA1C in the last 72 hours. Lipid Profile No results for input(s): CHOL, HDL, LDLCALC, TRIG, CHOLHDL, LDLDIRECT in the last 72 hours. Thyroid function studies No results for input(s): TSH, T4TOTAL, T3FREE, THYROIDAB in the last 72 hours.  Invalid input(s): FREET3 Anemia work up No results for input(s): VITAMINB12, FOLATE, FERRITIN, TIBC, IRON, RETICCTPCT in the last 72 hours. Urinalysis    Component Value Date/Time   COLORURINE YELLOW 03/11/2017 2008   APPEARANCEUR CLEAR 03/11/2017 2008   LABSPEC 1.007 03/11/2017 2008   PHURINE 7.0 03/11/2017 2008   GLUCOSEU >=500 (A) 03/11/2017 2008   HGBUR NEGATIVE 03/11/2017 2008   [place] NEGATIVE 03/11/2017 2008   [place] 03/11/2017 2008   PROTEINUR 30 (A) 03/11/2017 2008   NITRITE NEGATIVE 03/11/2017 2008   LEUKOCYTESUR SMALL (A) 03/11/2017 2008   Sepsis Labs Invalid input(s): PROCALCITONIN,  WBC,  LACTICIDVEN Microbiology Recent Results (from the past 240 hour(s))  MRSA PCR Screening     Status: None   Collection Time: 03/11/17  3:54 PM  Result Value Ref Range Status   MRSA by PCR NEGATIVE NEGATIVE Final    Comment:        The GeneXpert MRSA Assay (FDA approved for NASAL specimens only), is one component of a comprehensive MRSA colonization  surveillance program. It is not intended to diagnose MRSA infection nor to guide or monitor treatment for MRSA infections.   Body fluid culture     Status: None (Preliminary result)   Collection Time: 03/13/17  2:00 PM  Result Value Ref Range Status   Specimen Description PERITONEAL DIALYSATE  Final   Special Requests Immunocompromised  Final   Gram Stain   Final    FEW WBC PRESENT,BOTH PMN AND MONONUCLEAR NO ORGANISMS SEEN    Culture NO GROWTH 3 DAYS  Final   Report Status PENDING   Incomplete     Time coordinating discharge: Over 30 minutes  SIGNED:  [place]ania Hall-Potvin, PA-S  [facility] 03/16/2017, 1:51 PM   Attending MD note  Patient was seen, examined,treatment plan was discussed with the PA-S.  I have personally reviewed the clinical findings, lab, imaging studies and management of this patient in detail. I agree with the documentation, as recorded by the PA-S.  Patient will be discharged home with IV antibiotic to be given during dialysis.  Nephrology is arranging follow-up.  Patient stable for discharge  Chipper Oman, MD    Pager please text page via  [email or web address]

## 2017-03-16 NOTE — Progress Notes (Signed)
Nutrition Follow-up  DOCUMENTATION CODES:   Non-severe (moderate) malnutrition in context of chronic illness  INTERVENTION:   -Decrease Nepro Shake po to daily, each supplement provides 425 kcal and 19 grams protein -30 ml Prostat BID, each supplement provides 100 kcals and 15 grams protein  NUTRITION DIAGNOSIS:   Malnutrition (Moderate) related to chronic illness (ESRD) as evidenced by moderate depletions of muscle mass, moderate depletion of body fat.  Ongoing  GOAL:   Patient will meet greater than or equal to 90% of their needs  Progressing  MONITOR:   PO intake, Supplement acceptance, Weight trends, Labs  REASON FOR ASSESSMENT:   Malnutrition Screening Tool    ASSESSMENT:   Pt with PMH significant for DM, ESRD (switches between PD and HD for last 6 months), HTN, NASH, and stroke. Pt reports starting HD treatments 9/26 with her last treatment being 10/11. Pt missed last HD treatment due to diarrhea symptoms. Pt presents this admission with symptomatic anemia.   Pt with PD cath-related peritonitis per nephrology.   Spoke with pt, who reports appetite is improving- she consumed 75% of her breakfast per her report. Meal completion 75-90%. Pt shares that she tries to consume at least 2 times per day even though she is not hungry. She has been consuming Nepro shakes and shares she often consumes Prostat at home as well. Nepro shake at bedside untouched.   Discussed with pt importance of good meal and supplement intake to promote healing.   Labs reviewed: CBGS: 99-158 (current orders for glycemic control are 0-5 units insulin aspart daily and 0-9 units insulin aspart TID with meals).   Diet Order:  Diet renal with fluid restriction Fluid restriction: 1200 mL Fluid; Room service appropriate? Yes; Fluid consistency: Thin  Skin:  Reviewed, no issues  Last BM:  03/14/17  Height:   Ht Readings from Last 1 Encounters:  03/10/17 5' 2"  (1.575 m)    Weight:   Wt  Readings from Last 1 Encounters:  03/16/17 114 lb 6.7 oz (51.9 kg)    Ideal Body Weight:  50 kg  BMI:  Body mass index is 20.93 kg/m.  Estimated Nutritional Needs:   Kcal:  1500-1750 (30-35 kcal/kg)  Protein:  85-95 grams (1.7- 1.9 g/kg)  Fluid:  >1.5 L/day  EDUCATION NEEDS:   Education needs addressed  Dondre Catalfamo A. Jimmye Norman, RD, LDN, CDE Pager: [phone] After hours Pager: [phone]

## 2017-03-16 NOTE — Care Management Note (Signed)
Case Management Note  Patient Details  Name: Samantha Conrad MRN: [id number] Date of Birth: 06-15-1940  Subjective/Objective:    Presented with abdominal pain, generalized weakness, and diarrhea x 3 days on 10/14. Hx of end stage renal disease on peritoneal dialysis (recently switched to hemodialysis, graft in right upper arm), type II DM, calcific pancreatitis, [name], seizures, and CVA. Resides with husband. Pt independent with ADL's, no DME usage.         PCP: [facility]  Action/Plan: Plan is to d/c to home today. No needs identified per CM. Pt'S son to provide transportation to home.  Expected Discharge Date:  03/16/17               Expected Discharge Plan:  Home/Self Care  In-House Referral:     Discharge planning Services  CM Consult  Status of Service:  Completed, signed off  If discussed at [place] of Stay Meetings, dates discussed:    Additional Comments:  Sharin Mons, RN 03/16/2017, 2:53 PM

## 2017-03-17 LAB — BODY FLUID CULTURE: Culture: NO GROWTH

## 2017-08-16 DIAGNOSIS — D649 Anemia, unspecified: Secondary | ICD-10-CM | POA: Diagnosis not present

## 2017-08-16 DIAGNOSIS — N186 End stage renal disease: Secondary | ICD-10-CM | POA: Diagnosis not present

## 2017-09-18 DIAGNOSIS — D6959 Other secondary thrombocytopenia: Secondary | ICD-10-CM | POA: Diagnosis not present

## 2017-09-18 DIAGNOSIS — D649 Anemia, unspecified: Secondary | ICD-10-CM | POA: Diagnosis not present

## 2017-09-18 DIAGNOSIS — K7581 Nonalcoholic steatohepatitis (NASH): Secondary | ICD-10-CM | POA: Diagnosis not present

## 2017-11-29 ENCOUNTER — Other Ambulatory Visit: Payer: Self-pay

## 2017-11-29 ENCOUNTER — Inpatient Hospital Stay (HOSPITAL_COMMUNITY)
Admission: EM | Admit: 2017-11-29 | Discharge: 2017-11-30 | DRG: 699 | Disposition: A | Discharge status: Home Health | Payer: Medicare Other | Attending: Family Medicine | Admitting: Family Medicine

## 2017-11-29 ENCOUNTER — Encounter (HOSPITAL_COMMUNITY): Payer: Self-pay | Admitting: *Deleted

## 2017-11-29 DIAGNOSIS — Z86010 Personal history of colonic polyps: Secondary | ICD-10-CM

## 2017-11-29 DIAGNOSIS — Z7984 Long term (current) use of oral hypoglycemic drugs: Secondary | ICD-10-CM

## 2017-11-29 DIAGNOSIS — K7581 Nonalcoholic steatohepatitis (NASH): Secondary | ICD-10-CM | POA: Diagnosis present

## 2017-11-29 DIAGNOSIS — E46 Unspecified protein-calorie malnutrition: Secondary | ICD-10-CM | POA: Diagnosis present

## 2017-11-29 DIAGNOSIS — D649 Anemia, unspecified: Secondary | ICD-10-CM | POA: Diagnosis present

## 2017-11-29 DIAGNOSIS — Z6823 Body mass index (BMI) 23.0-23.9, adult: Secondary | ICD-10-CM

## 2017-11-29 DIAGNOSIS — N186 End stage renal disease: Secondary | ICD-10-CM | POA: Diagnosis present

## 2017-11-29 DIAGNOSIS — K861 Other chronic pancreatitis: Secondary | ICD-10-CM | POA: Diagnosis present

## 2017-11-29 DIAGNOSIS — K5900 Constipation, unspecified: Secondary | ICD-10-CM | POA: Diagnosis not present

## 2017-11-29 DIAGNOSIS — F329 Major depressive disorder, single episode, unspecified: Secondary | ICD-10-CM | POA: Diagnosis not present

## 2017-11-29 DIAGNOSIS — G40909 Epilepsy, unspecified, not intractable, without status epilepticus: Secondary | ICD-10-CM | POA: Diagnosis not present

## 2017-11-29 DIAGNOSIS — E1122 Type 2 diabetes mellitus with diabetic chronic kidney disease: Secondary | ICD-10-CM | POA: Diagnosis not present

## 2017-11-29 DIAGNOSIS — K7460 Unspecified cirrhosis of liver: Secondary | ICD-10-CM | POA: Diagnosis present

## 2017-11-29 DIAGNOSIS — E785 Hyperlipidemia, unspecified: Secondary | ICD-10-CM | POA: Diagnosis present

## 2017-11-29 DIAGNOSIS — Z7951 Long term (current) use of inhaled steroids: Secondary | ICD-10-CM

## 2017-11-29 DIAGNOSIS — N2581 Secondary hyperparathyroidism of renal origin: Secondary | ICD-10-CM | POA: Diagnosis present

## 2017-11-29 DIAGNOSIS — Z85828 Personal history of other malignant neoplasm of skin: Secondary | ICD-10-CM

## 2017-11-29 DIAGNOSIS — D631 Anemia in chronic kidney disease: Secondary | ICD-10-CM | POA: Diagnosis present

## 2017-11-29 DIAGNOSIS — Z8673 Personal history of transient ischemic attack (TIA), and cerebral infarction without residual deficits: Secondary | ICD-10-CM

## 2017-11-29 DIAGNOSIS — Z66 Do not resuscitate: Secondary | ICD-10-CM | POA: Diagnosis not present

## 2017-11-29 DIAGNOSIS — Z9181 History of falling: Secondary | ICD-10-CM

## 2017-11-29 DIAGNOSIS — E039 Hypothyroidism, unspecified: Secondary | ICD-10-CM | POA: Diagnosis not present

## 2017-11-29 DIAGNOSIS — Z79899 Other long term (current) drug therapy: Secondary | ICD-10-CM

## 2017-11-29 DIAGNOSIS — Z992 Dependence on renal dialysis: Secondary | ICD-10-CM

## 2017-11-29 DIAGNOSIS — Z91041 Radiographic dye allergy status: Secondary | ICD-10-CM

## 2017-11-29 DIAGNOSIS — E8889 Other specified metabolic disorders: Secondary | ICD-10-CM | POA: Diagnosis not present

## 2017-11-29 DIAGNOSIS — D696 Thrombocytopenia, unspecified: Secondary | ICD-10-CM | POA: Diagnosis not present

## 2017-11-29 HISTORY — DX: Type 2 diabetes mellitus without complications: E11.9

## 2017-11-29 HISTORY — DX: Dependence on renal dialysis: Z99.2

## 2017-11-29 HISTORY — DX: End stage renal disease: N18.6

## 2017-11-29 HISTORY — DX: Basal cell carcinoma of skin, unspecified: C44.91

## 2017-11-29 LAB — CBC WITH DIFFERENTIAL/PLATELET
ABS IMMATURE GRANULOCYTES: 0.1 10*3/uL (ref 0.0–0.1)
BASOS PCT: 0 %
Basophils Absolute: 0 10*3/uL (ref 0.0–0.1)
EOS ABS: 0.1 10*3/uL (ref 0.0–0.7)
Eosinophils Relative: 1 %
HCT: 18.4 % — ABNORMAL LOW (ref 36.0–46.0)
Hemoglobin: 5.8 g/dL — CL (ref 12.0–15.0)
IMMATURE GRANULOCYTES: 1 %
Lymphocytes Relative: 13 %
Lymphs Abs: 0.9 10*3/uL (ref 0.7–4.0)
MCH: 30.1 pg (ref 26.0–34.0)
MCHC: 31.5 g/dL (ref 30.0–36.0)
MCV: 95.3 fL (ref 78.0–100.0)
MONOS PCT: 8 %
Monocytes Absolute: 0.6 10*3/uL (ref 0.1–1.0)
NEUTROS PCT: 77 %
Neutro Abs: 5.7 10*3/uL (ref 1.7–7.7)
PLATELETS: 66 10*3/uL — AB (ref 150–400)
RBC: 1.93 MIL/uL — AB (ref 3.87–5.11)
RDW: 12.5 % (ref 11.5–15.5)
WBC: 7.4 10*3/uL (ref 4.0–10.5)

## 2017-11-29 LAB — HEPATIC FUNCTION PANEL
ALT: 18 U/L (ref 0–44)
AST: 24 U/L (ref 15–41)
Albumin: 2.7 g/dL — ABNORMAL LOW (ref 3.5–5.0)
Alkaline Phosphatase: 68 U/L (ref 38–126)
Bilirubin, Direct: <0.1 mg/dL (ref 0.0–0.2)
Total Bilirubin: 0.6 mg/dL (ref 0.3–1.2)
Total Protein: 4.9 g/dL — ABNORMAL LOW (ref 6.5–8.1)

## 2017-11-29 LAB — GLUCOSE, CAPILLARY
GLUCOSE-CAPILLARY: 55 mg/dL — AB (ref 70–99)
Glucose-Capillary: 64 mg/dL — ABNORMAL LOW (ref 70–99)
Glucose-Capillary: 83 mg/dL (ref 70–99)
Glucose-Capillary: 122 mg/dL — ABNORMAL HIGH (ref 70–99)

## 2017-11-29 LAB — I-STAT CHEM 8, ED
BUN: 118 mg/dL — ABNORMAL HIGH (ref 8–23)
Calcium, Ion: 0.98 mmol/L — ABNORMAL LOW (ref 1.15–1.40)
Chloride: 98 mmol/L (ref 98–111)
Creatinine, Ser: 5.2 mg/dL — ABNORMAL HIGH (ref 0.44–1.00)
Glucose, Bld: 217 mg/dL — ABNORMAL HIGH (ref 70–99)
HEMATOCRIT: 17 % — AB (ref 36.0–46.0)
Hemoglobin: 5.8 g/dL — CL (ref 12.0–15.0)
Potassium: 3.6 mmol/L (ref 3.5–5.1)
SODIUM: 134 mmol/L — AB (ref 135–145)
TCO2: 23 mmol/L (ref 22–32)

## 2017-11-29 LAB — CBC
HCT: 23.8 % — ABNORMAL LOW (ref 36.0–46.0)
Hemoglobin: 8.1 g/dL — ABNORMAL LOW (ref 12.0–15.0)
MCH: 30.7 pg (ref 26.0–34.0)
MCHC: 34 g/dL (ref 30.0–36.0)
MCV: 90.2 fL (ref 78.0–100.0)
PLATELETS: 53 10*3/uL — AB (ref 150–400)
RBC: 2.64 MIL/uL — ABNORMAL LOW (ref 3.87–5.11)
RDW: 12.7 % (ref 11.5–15.5)
WBC: 7.1 10*3/uL (ref 4.0–10.5)

## 2017-11-29 LAB — BASIC METABOLIC PANEL
ANION GAP: 11 (ref 5–15)
BUN: 103 mg/dL — ABNORMAL HIGH (ref 8–23)
CALCIUM: 7.5 mg/dL — AB (ref 8.9–10.3)
CHLORIDE: 100 mmol/L (ref 98–111)
CO2: 24 mmol/L (ref 22–32)
CREATININE: 5.11 mg/dL — AB (ref 0.44–1.00)
GFR calc non Af Amer: 7 mL/min — ABNORMAL LOW (ref >60)
GFR, EST AFRICAN AMERICAN: 8 mL/min — AB (ref >60)
Glucose, Bld: 227 mg/dL — ABNORMAL HIGH (ref 70–99)
Potassium: 3.6 mmol/L (ref 3.5–5.1)
Sodium: 135 mmol/L (ref 135–145)

## 2017-11-29 LAB — PREPARE RBC (CROSSMATCH)

## 2017-11-29 LAB — POC OCCULT BLOOD, ED: FECAL OCCULT BLD: NEGATIVE

## 2017-11-29 MED ORDER — BISACODYL 10 MG RE SUPP: 10.0000 mg | Freq: Every day | RECTAL | Status: DC | PRN

## 2017-11-29 MED ORDER — CALCITRIOL 0.25 MCG PO CAPS
0.2500 ug | ORAL_CAPSULE | Freq: Every day | ORAL | Status: DC
  Administered 2017-11-29 – 2017-11-30 (×2): 0.25 ug via ORAL
  Filled 2017-11-29 (×2): qty 1

## 2017-11-29 MED ORDER — DARBEPOETIN ALFA 150 MCG/0.3ML IJ SOSY
150.0000 ug | PREFILLED_SYRINGE | INTRAMUSCULAR | Status: DC
  Filled 2017-11-29: qty 0.3

## 2017-11-29 MED ORDER — SUCROFERRIC OXYHYDROXIDE 500 MG PO CHEW
500.0000 mg | CHEWABLE_TABLET | Freq: Three times a day (TID) | ORAL | Status: DC
  Administered 2017-11-30: 500 mg via ORAL
  Filled 2017-11-29 (×4): qty 1

## 2017-11-29 MED ORDER — INSULIN ASPART 100 UNIT/ML ~~LOC~~ SOLN: 0.0000 [IU] | Freq: Every day | SUBCUTANEOUS | Status: DC

## 2017-11-29 MED ORDER — SODIUM CHLORIDE 0.9% IV SOLUTION
Freq: Once | INTRAVENOUS | Status: AC
Start: 2017-11-29 — End: 2017-11-29
  Administered 2017-11-29: 16:00:00 via INTRAVENOUS

## 2017-11-29 MED ORDER — PROSIGHT PO TABS
1.0000 | ORAL_TABLET | Freq: Every day | ORAL | Status: DC
  Administered 2017-11-30: 1 via ORAL
  Filled 2017-11-29: qty 1

## 2017-11-29 MED ORDER — LOSARTAN POTASSIUM 25 MG PO TABS
25.0000 mg | ORAL_TABLET | Freq: Every day | ORAL | Status: DC
  Administered 2017-11-30: 25 mg via ORAL
  Filled 2017-11-29: qty 1

## 2017-11-29 MED ORDER — LEVETIRACETAM 250 MG PO TABS
250.0000 mg | ORAL_TABLET | Freq: Two times a day (BID) | ORAL | Status: DC
  Administered 2017-11-29 – 2017-11-30 (×2): 250 mg via ORAL
  Filled 2017-11-29 (×2): qty 1

## 2017-11-29 MED ORDER — DELFLEX-LC/2.5% DEXTROSE 394 MOSM/L IP SOLN
INTRAPERITONEAL | Status: DC
  Administered 2017-11-29: 19:00:00 via INTRAPERITONEAL

## 2017-11-29 MED ORDER — NITROGLYCERIN 0.4 MG SL SUBL: 0.4000 mg | SUBLINGUAL_TABLET | SUBLINGUAL | Status: DC | PRN

## 2017-11-29 MED ORDER — PRO-STAT SUGAR FREE PO LIQD
30.0000 mL | Freq: Two times a day (BID) | ORAL | Status: DC
  Administered 2017-11-29 – 2017-11-30 (×2): 30 mL via ORAL
  Filled 2017-11-29 (×2): qty 30

## 2017-11-29 MED ORDER — PANCRELIPASE (LIP-PROT-AMYL) 12000-38000 UNITS PO CPEP
24000.0000 [IU] | ORAL_CAPSULE | Freq: Three times a day (TID) | ORAL | Status: DC
  Administered 2017-11-29 – 2017-11-30 (×3): 24000 [IU] via ORAL
  Filled 2017-11-29 (×3): qty 2

## 2017-11-29 MED ORDER — INSULIN ASPART 100 UNIT/ML ~~LOC~~ SOLN: 0.0000 [IU] | Freq: Three times a day (TID) | SUBCUTANEOUS | Status: DC

## 2017-11-29 MED ORDER — CITALOPRAM HYDROBROMIDE 20 MG PO TABS
10.0000 mg | ORAL_TABLET | Freq: Every day | ORAL | Status: DC
  Administered 2017-11-30: 10 mg via ORAL
  Filled 2017-11-29: qty 1

## 2017-11-29 MED ORDER — DELFLEX-LC/2.5% DEXTROSE 394 MOSM/L IP SOLN: INTRAPERITONEAL | Status: DC

## 2017-11-29 MED ORDER — HEPARIN 1000 UNIT/ML FOR PERITONEAL DIALYSIS: 500.0000 [IU] | INTRAMUSCULAR | Status: DC | PRN

## 2017-11-29 MED ORDER — ACETAMINOPHEN-CODEINE #3 300-30 MG PO TABS: 1.0000 | ORAL_TABLET | Freq: Four times a day (QID) | ORAL | Status: DC | PRN

## 2017-11-29 MED ORDER — HEPARIN 1000 UNIT/ML FOR PERITONEAL DIALYSIS
INTRAPERITONEAL | Status: DC | PRN
  Filled 2017-11-29: qty 5000

## 2017-11-29 MED ORDER — PREDNISOLONE ACETATE 1 % OP SUSP
1.0000 [drp] | Freq: Every day | OPHTHALMIC | Status: DC
  Administered 2017-11-29: 1 [drp] via OPHTHALMIC
  Filled 2017-11-29: qty 5

## 2017-11-29 MED ORDER — TRAZODONE HCL 50 MG PO TABS
50.0000 mg | ORAL_TABLET | Freq: Every day | ORAL | Status: DC
  Administered 2017-11-29: 50 mg via ORAL
  Filled 2017-11-29: qty 1

## 2017-11-29 MED ORDER — LEVOTHYROXINE SODIUM 100 MCG PO TABS
100.0000 ug | ORAL_TABLET | Freq: Every day | ORAL | Status: DC
  Administered 2017-11-30: 100 ug via ORAL
  Filled 2017-11-29: qty 1

## 2017-11-29 MED ORDER — HEPARIN SODIUM (PORCINE) 5000 UNIT/ML IJ SOLN: 5000.0000 [IU] | Freq: Three times a day (TID) | INTRAMUSCULAR | Status: DC

## 2017-11-29 MED ORDER — SUCROFERRIC OXYHYDROXIDE 500 MG PO CHEW: 500.0000 mg | CHEWABLE_TABLET | Freq: Two times a day (BID) | ORAL | Status: DC

## 2017-11-29 MED ORDER — FUROSEMIDE 40 MG PO TABS
40.0000 mg | ORAL_TABLET | Freq: Two times a day (BID) | ORAL | Status: DC
  Administered 2017-11-29 – 2017-11-30 (×2): 40 mg via ORAL
  Filled 2017-11-29 (×2): qty 1

## 2017-11-29 MED ORDER — GENTAMICIN SULFATE 0.1 % EX CREA
1.0000 "application " | TOPICAL_CREAM | Freq: Every day | CUTANEOUS | Status: DC
  Administered 2017-11-29 – 2017-11-30 (×3): 1 via TOPICAL
  Filled 2017-11-29: qty 15

## 2017-11-29 MED ORDER — POLYETHYLENE GLYCOL 3350 17 G PO PACK: 17.0000 g | PACK | Freq: Every day | ORAL | Status: DC | PRN

## 2017-11-29 NOTE — Consult Note (Addendum)
[facility] Renal Consultation Note    Indication for Consultation:  Management of ESRD/hemodialysis; anemia, hypertension/volume and secondary hyperparathyroidism Outpatient nephrologist: Justin Mend   HPI: Samantha Conrad is a 77 y.o. 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   She presented to ED for evaluation of symptomatic anemia. Labs drawn at outpatient dialysis center on 7/1 showed Hgb of 5.9, a significant drop from 11.5 on 10/29/17. She was instructed to go to ED for transfusion. Labs in ED significant for Hgb 5.8, K 3.6, Cr 5.20, BUN 118, Platelets 66K, Glucose 217. FOBT negative. Getting set up now for transfusion of 2 units PRBCs. She will be admitted for further evaluation.   She endorses increased weakness, exertional dyspnea and nausea. She denies fevers, chest pain, abdominal pain, dark stools, overt bleeding or NSAID use.   Home dialysis via CCPD 7x/week. Had some cramping recently with 4.25% sol. Also has RUE AVG as backup.   Past Medical History:  Diagnosis Date  . Allergic rhinitis   . Cancer ([place])    skin cancer - has had removed (basal cell carcinoma on nose/face)  . Constipation   . Depression   . Diabetes mellitus without complication ([place])    diet controlled now.   . Diarrhea due to drug   . Environmental and seasonal allergies   . ESRD (end stage renal disease) ([place])    Dialysis T/Th/Sa  . Fatty liver   . Headache    none since Stroke in Dec. 2017  . Hypertension   . Hypothyroidism   . NASH (nonalcoholic steatohepatitis)   . Seizures ([place])    ? when she may have had a stroke sometime in the spring of 2018  . Sleep apnea    diagnosed 6-7 years ago "but no longer a problem since weight loss"  . Stroke [facility]) 04/2016   memory issues, lost 1/2 vision in both eyes  . Thrombocytopenia ([place])    . Thyroid disease    Past Surgical History:  Procedure Laterality Date  . ABDOMINAL HYSTERECTOMY    . AV FISTULA PLACEMENT Right 10/30/2016   Procedure: INSERTION OF ARTERIOVENOUS (AV) GORE-TEX GRAFT RIGHT UPPER ARM;  Surgeon: Elam Dutch, MD;  Location: [place];  Service: Vascular;  Laterality: Right;  . CHOLECYSTECTOMY    . corneal implant bilateral Bilateral   . ROTATOR CUFF REPAIR Left   . THROMBECTOMY AND REVISION OF ARTERIOVENTOUS (AV) GORETEX  GRAFT Right 12/04/2016   Procedure: THROMBECTOMY/ REVISION OF RIGHT UPPER ARM ARTERIOVENOUS GORETEX GRAFT;  Surgeon: Elam Dutch, MD;  Location: [place];  Service: Vascular;  Laterality: Right;  . TONSILLECTOMY     No family history on file. Social History:  reports that she has never smoked. She has never used smokeless tobacco. She reports that she does not drink alcohol or use drugs. Allergies  Allergen Reactions  . Contrast Media [Iodinated Diagnostic Agents] Swelling    SWELLING REACTION UNSPECIFIED    Prior to Admission medications   Medication Sig Start Date End Date Taking? Authorizing Provider  acetaminophen-codeine (TYLENOL #3) 300-30 MG tablet Take 1-2 tablets by mouth every 6 (six) hours as needed for moderate pain or severe pain. 12/04/16   Alvia Grove, PA-C  Amino Acids-Protein Hydrolys (FEEDING SUPPLEMENT, PRO-STAT SUGAR FREE 64,) LIQD Take 30 mLs by mouth 2 (two) times daily. 03/16/17   Patrecia Pour,  Christean Grief, MD  AURYXIA 1 GM 210 MG(Fe) tablet Take 2 tablets by mouth 3 (three) times daily with meals. 11/28/16   [provider]  bisacodyl (DULCOLAX) 10 MG suppository Place 10 mg rectally daily as needed (for constipation.).     [provider]  cefTAZidime in dextrose 5 % 50 mL Dose to be given with dialysis for 10 days 03/16/17   Patrecia Pour, Christean Grief, MD  Darbepoetin Alfa (ARANESP) 150 MCG/0.3ML SOSY injection Inject 0.3 mLs (150 mcg total) into the vein every Tuesday with hemodialysis. 03/20/17    Doreatha Lew, MD  ferric gluconate 125 mg in sodium chloride 0.9 % 100 mL Inject 125 mg into the vein Every Tuesday,Thursday,and Saturday with dialysis. 03/17/17   Patrecia Pour, Christean Grief, MD  levETIRAcetam (KEPPRA) 250 MG tablet Take 250 mg by mouth every 12 (twelve) hours. With breakfast & with supper.    [provider]  levothyroxine (SYNTHROID, LEVOTHROID) 100 MCG tablet Take 100 mcg by mouth daily before breakfast.  09/02/16   [provider]  linagliptin (TRADJENTA) 5 MG TABS tablet Take 5 mg by mouth daily.    [provider]  multivitamin (PROSIGHT) TABS tablet Take 1 tablet by mouth daily. 03/17/17   Doreatha Lew, MD  nitroGLYCERIN (NITROSTAT) 0.4 MG SL tablet Place 0.4 mg under the tongue every 5 (five) minutes as needed for chest pain.    [provider]  Nutritional Supplements (FEEDING SUPPLEMENT, NEPRO CARB STEADY,) LIQD Take 237 mLs by mouth daily. 03/17/17   Doreatha Lew, MD  Pancrelipase, Lip-Prot-Amyl, (CREON) 24000-76000 units CPEP Take 2 capsules by mouth 3 (three) times daily with meals.     [provider]  polyethylene glycol (MIRALAX / GLYCOLAX) packet Take 17 g by mouth daily as needed for mild constipation (for constipation).     [provider]  prednisoLONE acetate (PRED FORTE) 1 % ophthalmic suspension Place 1 drop into both eyes at bedtime.     [provider]  traZODone (DESYREL) 50 MG tablet Take 50 mg by mouth at bedtime.    [provider]  vancomycin 500 mg in sodium chloride 0.9 % 100 mL Inject 500 mg into the vein every dialysis. 03/16/17   Doreatha Lew, MD   Current Facility-Administered Medications  Medication Dose Route Frequency Provider Last Rate Last Dose  . 0.9 %  sodium chloride infusion (Manually program via Guardrails IV Fluids)   Intravenous Once Malvin Johns, MD       Current Outpatient Medications  Medication Sig Dispense Refill  .  acetaminophen-codeine (TYLENOL #3) 300-30 MG tablet Take 1-2 tablets by mouth every 6 (six) hours as needed for moderate pain or severe pain. 15 tablet 0  . Amino Acids-Protein Hydrolys (FEEDING SUPPLEMENT, PRO-STAT SUGAR FREE 64,) LIQD Take 30 mLs by mouth 2 (two) times daily. 900 mL 0  . AURYXIA 1 GM 210 MG(Fe) tablet Take 2 tablets by mouth 3 (three) times daily with meals.  1  . bisacodyl (DULCOLAX) 10 MG suppository Place 10 mg rectally daily as needed (for constipation.).     Marland Kitchen cefTAZidime in dextrose 5 % 50 mL Dose to be given with dialysis for 10 days    . Darbepoetin Alfa (ARANESP) 150 MCG/0.3ML SOSY injection Inject 0.3 mLs (150 mcg total) into the vein every Tuesday with hemodialysis. 1.68 mL   . ferric gluconate 125 mg in sodium chloride 0.9 % 100 mL Inject 125 mg into the vein Every Tuesday,Thursday,and Saturday with dialysis.    Marland Kitchen  levETIRAcetam (KEPPRA) 250 MG tablet Take 250 mg by mouth every 12 (twelve) hours. With breakfast & with supper.    . levothyroxine (SYNTHROID, LEVOTHROID) 100 MCG tablet Take 100 mcg by mouth daily before breakfast.   0  . linagliptin (TRADJENTA) 5 MG TABS tablet Take 5 mg by mouth daily.    . multivitamin (PROSIGHT) TABS tablet Take 1 tablet by mouth daily. 30 each 0  . nitroGLYCERIN (NITROSTAT) 0.4 MG SL tablet Place 0.4 mg under the tongue every 5 (five) minutes as needed for chest pain.    . Nutritional Supplements (FEEDING SUPPLEMENT, NEPRO CARB STEADY,) LIQD Take 237 mLs by mouth daily.  0  . Pancrelipase, Lip-Prot-Amyl, (CREON) 24000-76000 units CPEP Take 2 capsules by mouth 3 (three) times daily with meals.     . polyethylene glycol (MIRALAX / GLYCOLAX) packet Take 17 g by mouth daily as needed for mild constipation (for constipation).     . prednisoLONE acetate (PRED FORTE) 1 % ophthalmic suspension Place 1 drop into both eyes at bedtime.     . traZODone (DESYREL) 50 MG tablet Take 50 mg by mouth at bedtime.    . vancomycin 500 mg in sodium chloride  0.9 % 100 mL Inject 500 mg into the vein every dialysis.      ROS: As per HPI otherwise negative.  Physical Exam: Vitals:   11/29/17 1128 11/29/17 1130 11/29/17 1200  BP: (!) 115/39 (!) 111/39 (!) 103/40  Pulse: 81 75 73  Resp: 16    Temp: (!) 97.3 F (36.3 C)    TempSrc: Oral    SpO2: 100% 99% 100%  Weight:  53.5 kg (118 lb)   Height:  5' 1"  (1.549 m)      General: WDWN elderly female, appears tired,  NAD  Head: NCAT sclera not icteric MMM Neck: Supple. No JVD No masses Lungs: CTA bilaterally without wheezes, rales, or rhonchi. Breathing is unlabored. Heart: RRR with S1 S2 Abdomen: soft NT + BS PD cath in place, site clean  Lower extremities:without edema or ischemic changes, no open wounds  Neuro: A & O  X 3. Moves all extremities spontaneously. Psych:  Responds to questions appropriately with a normal affect. Dialysis Access: PD cath; RUE AVG +bruit   Labs: Basic Metabolic Panel: Recent Labs  Lab 11/29/17 1127 11/29/17 1203  NA 135 134*  K 3.6 3.6  CL 100 98  CO2 24  --   GLUCOSE 227* 217*  BUN 103* 118*  CREATININE 5.11* 5.20*  CALCIUM 7.5*  --    Liver Function Tests: No results for input(s): AST, ALT, ALKPHOS, BILITOT, PROT, ALBUMIN in the last 168 hours. No results for input(s): LIPASE, AMYLASE in the last 168 hours. No results for input(s): AMMONIA in the last 168 hours. CBC: Recent Labs  Lab 11/29/17 1127 11/29/17 1203  WBC 7.4  --   NEUTROABS 5.7  --   HGB 5.8* 5.8*  HCT 18.4* 17.0*  MCV 95.3  --   PLT 66*  --    Cardiac Enzymes: No results for input(s): CKTOTAL, CKMB, CKMBINDEX, TROPONINI in the last 168 hours. CBG: No results for input(s): GLUCAP in the last 168 hours. Iron Studies: No results for input(s): IRON, TIBC, TRANSFERRIN, FERRITIN in the last 72 hours. Studies/Results: No results found.  Dialysis Orders:  CCPD 7x/week EDW 51.5kg  3 exchanges Fill volume 206m  Dwell time 1.5h  No day dwell No pauses  Outpatient Labs 7/1:  Hgb 5.9, Ferritin 1782, Tsat unable to  calc,  Plts 72, Ca 7.8, Corr Ca 8.5, Phos 6.2, Alb 3.1  PTH 403   Assessment/Plan: 1. Symptomatic anemia: Admitted with Hgb 5.8. For transfusion in ED- 2 units PRBCs today. FOBT negative. Further w/u per primary team. Follow labs post transfusion.   2. ESRD -  Continue CCPD. K 3.6 on  KCl 96mq daily  3. Hypertension/volume  - BP low/stable. No volume excess on exam. Use 2.5% sol  4. Anemia  - For transfusion as above. Resume ESA Aranesp 1542m q week ordered.  5. Metabolic bone disease -   Ca 7.5. Continue Velphoro binder/Add calcitriol for low Ca ^PTH  6. Nutrition - Renal diet/prostat supp 7. NASH/chronic thrombocytopenia    OgLynnda ChildA-C [facility] Pager [phone]/08/2017, 1:31 PM   Renal Attending: Pt with ESRD on CCPD followed by Dr WeJustin Mendith a hx of anemia and PRBCs.  She has known cirrhosis of the liver due to NASH.  She has had ascites in the past and EGD in 2016 did not show varices. On May 19 Hgb was 14.2, on June 3 11.5 and [facility] [place] [facility]. Stool is neg today for OB as it has been in the past.  She denies visible blood loss. She also has known thrombocytopenia evaluated by Hema in the past.    I think we should rule out an immune mediated hematologic etiology(eg Evan's Syndrome) and ask Heme to reevaluate; and we can consider increase exchanges to 4 or 5 times per day and giving 1 or 2 days off PD per week if clearances are acceptable with current prescription.   AlEstanislado EmmsMD

## 2017-11-29 NOTE — ED Notes (Signed)
Dr. Tamera Punt notified of low Hgb of 5.8 on Chem-8

## 2017-11-29 NOTE — H&P (Addendum)
[facility] Admission History and Physical Service Pager: [phone]  Patient name: Samantha Conrad Medical record number: [id number] Date of birth: [birth year] Age: 77 y.o. Gender: female  Primary Care Provider: Physicians, Di Kindle Family Consultants: Nephro, GI Code Status: DNR  Chief Complaint: Weakness  Assessment and Plan: PEG FIFER is a 77 y.o. female presenting with weakness. PMH is significant for ESRD On PD QHS, NASH, T2DM.  Pt presented to the ED after receiving lab results or a Hgb of 5.8.  Symptomatic anemia - history of 2 weeks of fatigue, SOB with walking around house. No active bleeding per history and negative FOBT. Likely anemia of chronic disease 2/2 ESRD. History of ESRD, pt has not had a Hgb above 10 since before 02/2017. Per med review it appears Epo was stopped recently, unclear why. Patient's last colonoscopy in 2006 with some polyp removal by [facility]. Would like to rule out occult malignancy as cause for drop in Hgb. Per family patient saw hematologist recently due to consistently low platelets and he said he would follow her annually, unable to see these records in EMR. Patient is hemodynamically stable and in the process of getting 2 units of PRBC in the ED currently.  - admit to inpatient - attending Dr. Ardelia Mems - monitor vitals - Up with assistance - type and cross - RBCs 2 units, pt receiving one unit of blood presently (7/4 1500) - recheck Hgb 2 hours after second unit - FOBT negative - call GI re potential colonoscopy - will reach out to patient's hematologist for records, cannot see in Epic  ESRD- Pt administers own PD at home QHS - nephro is following, appreciate recs - pt to receive PD qhs - will discuss with nephro restarting darbepoetin  - phosphate binder - calcitriol  Thrombocytopenia- Likely 2/2 Cirrhosis. Per family, pt has received 2 platelet transfusions in the past and follows annually with  hepatologist.  - monitor platelets - consider transfuse for <50  Cirrhosis- Known history of NASH, pt has received platelet transfusions at least twice in the past. Platelets 66 on CBC from today. Patient appears slightly jaundiced in ED. -check hepatic funtion panel -monitor LFTs and enzymes -monitor platelets   T2DM- last A1C 6.8 in October 2018, on glipizide and tradjenta at home.  - hold home meds - sSSI - monitor CBGs AC/HS  HTN- chronic, stable, BP 108/41 in ED Losartan 25 mg  Hypothyroid- chronic, stable -continue home Levothyroxine  Seizure- chronic, stable. On keppra -continue home [place]  Hx Stroke Jan 2017 with residual memory deficits and vision loss- lives alone with close family support however patient seems to struggle with ADL's -will c/s PT/OT  Chronic pancreatitis- chronic, stable -continue home Creon   Constipation- chronic -continue home Bisacodyl  Protein calorie malnutrition- chronic -Continue prostat sugar free BID  Depression- chronic, stable -Citalopram 10 mg QD  FEN/GI: renal diet Prophylaxis: SCDs  Disposition: admit to inpatient  History of Present Illness:  Samantha Conrad is a 77 y.o. female presenting with weakness.  She reports she has felt fatigued for at least the past week. She has her son and daughter in law in the room with her and they elaborate adding she has been out of sorts for the past 2-3 weeks and getting worse. She got routine labs at the dialysis center done yesterday and was called to go to ED due to low blood counts. She has had low platelets in the past per her family and  has had platelet transfusions twice this year.   Has had low platelets twice this year. She saw heme-on[facility] 1-2 months ago and [facility] He found no abnormalities.  Pt mentioned that she often becomes short of breath while walking around the house.  Pt also shared that she occasionally experienced  lightheadedness when moving from sitting to a standing position.  She had fallen in the past during these episodes.  Most recently, pt fell one week ago getting up from the toilet. Pt denies hitting her head as far as she knows.  Pt denied hemoptysis, hematemesis, hematochezia, melena, vaginal bleeding or any known recent blood loss.  The remainder of ROS as below.   Review Of Systems: Per HPI with the following additions:   Review of Systems  Constitutional: Positive for malaise/fatigue. Negative for chills and fever.  HENT: Negative for congestion and hearing loss.   Eyes: Negative for blurred vision, double vision and redness.  Respiratory: Negative for cough, hemoptysis and shortness of breath.   Cardiovascular: Negative for chest pain, palpitations and leg swelling.  Gastrointestinal: Positive for constipation and nausea. Negative for abdominal pain, blood in stool, diarrhea, melena and vomiting.  Genitourinary: Negative for dysuria, hematuria and urgency.  Musculoskeletal: Positive for falls. Negative for joint pain and neck pain.  Skin: Negative for rash.  Neurological: Positive for tremors, loss of consciousness (post voiding) and weakness. Negative for speech change, focal weakness and headaches.  Endo/Heme/Allergies: Bruises/bleeds easily.  Psychiatric/Behavioral: Positive for depression and memory loss (from stroke). Negative for substance abuse and suicidal ideas.    Patient Active Problem List   Diagnosis Date Noted  . Symptomatic anemia 03/11/2017  . Diet-controlled diabetes mellitus ([place]) 03/11/2017  . NASH (nonalcoholic steatohepatitis) 03/11/2017  . Hypertension 03/11/2017  . Hypothyroidism 03/11/2017  . CKD (chronic kidney disease) stage V requiring chronic dialysis ([place]) 03/11/2017  . Seizure disorder 03/11/2017  . Abdominal pain 03/11/2017  . HLD (hyperlipidemia) 03/11/2017  . Chronic pancreatitis ([place]) 03/11/2017  . Constipation 03/11/2017  . Laryngopharyngeal  reflux (LPR) 02/17/2015  . H/O Mild allergic rhinitis 02/17/2015  . Cirrhosis ([place]) 02/17/2015    Past Medical History: Past Medical History:  Diagnosis Date  . Allergic rhinitis   . Cancer ([place])    skin cancer - has had removed (basal cell carcinoma on nose/face)  . Constipation   . Depression   . Diabetes mellitus without complication ([place])    diet controlled now.   . Diarrhea due to drug   . Environmental and seasonal allergies   . ESRD (end stage renal disease) ([place])    Dialysis T/Th/Sa  . Fatty liver   . Headache    none since Stroke in Dec. 2017  . Hypertension   . Hypothyroidism   . NASH (nonalcoholic steatohepatitis)   . Seizures ([place])    ? when she may have had a stroke sometime in the spring of 2018  . Sleep apnea    diagnosed 6-7 years ago "but no longer a problem since weight loss"  . Stroke [facility]) 04/2016   memory issues, lost 1/2 vision in both eyes  . Thrombocytopenia ([place])   . Thyroid disease     Past Surgical History: Past Surgical History:  Procedure Laterality Date  . ABDOMINAL HYSTERECTOMY    . AV FISTULA PLACEMENT Right 10/30/2016   Procedure: INSERTION OF ARTERIOVENOUS (AV) GORE-TEX GRAFT RIGHT UPPER ARM;  Surgeon: Elam Dutch, MD;  Location: [place];  Service:  Vascular;  Laterality: Right;  . CHOLECYSTECTOMY    . corneal implant bilateral Bilateral   . ROTATOR CUFF REPAIR Left   . THROMBECTOMY AND REVISION OF ARTERIOVENTOUS (AV) GORETEX  GRAFT Right 12/04/2016   Procedure: THROMBECTOMY/ REVISION OF RIGHT UPPER ARM ARTERIOVENOUS GORETEX GRAFT;  Surgeon: Elam Dutch, MD;  Location: [place];  Service: Vascular;  Laterality: Right;  . TONSILLECTOMY     Social History: Social History   Tobacco Use  . Smoking status: Never Smoker  . Smokeless tobacco: Never Used  Substance Use Topics  . Alcohol use: No  . Drug use: No   Additional social history: lives alone, prepares meals. Daughter in law fills out pill box. Husband admitted to assisted  living facility with alzheimer.  Please also refer to relevant sections of EMR.  Family History: No family history on file. Mother passed away when patient was young Father had glaucoma. She is unsure.  Sister has athritis  Allergies and Medications: Allergies  Allergen Reactions  . Contrast Media [Iodinated Diagnostic Agents] Swelling    SWELLING REACTION UNSPECIFIED    No current facility-administered medications on file prior to encounter.    Current Outpatient Medications on File Prior to Encounter  Medication Sig Dispense Refill  . acetaminophen-codeine (TYLENOL #3) 300-30 MG tablet Take 1-2 tablets by mouth every 6 (six) hours as needed for moderate pain or severe pain. 15 tablet 0  . Amino Acids-Protein Hydrolys (FEEDING SUPPLEMENT, PRO-STAT SUGAR FREE 64,) LIQD Take 30 mLs by mouth 2 (two) times daily. 900 mL 0  . bisacodyl (DULCOLAX) 10 MG suppository Place 10 mg rectally daily as needed (for constipation.).     Marland Kitchen citalopram (CELEXA) 10 MG tablet Take 10 mg by mouth daily.  1  . furosemide (LASIX) 40 MG tablet Take 40 mg by mouth 2 (two) times daily.    Marland Kitchen glipiZIDE (GLUCOTROL) 10 MG tablet Take 10 mg by mouth daily.  1  . KLOR-CON M20 20 MEQ tablet Take 20 mEq by mouth daily.  11  . levETIRAcetam (KEPPRA) 250 MG tablet Take 250 mg by mouth every 12 (twelve) hours. With breakfast & with supper.    . levothyroxine (SYNTHROID, LEVOTHROID) 100 MCG tablet Take 100 mcg by mouth daily before breakfast.   0  . linagliptin (TRADJENTA) 5 MG TABS tablet Take 5 mg by mouth daily.    Marland Kitchen losartan (COZAAR) 25 MG tablet Take 25 mg by mouth daily.    . multivitamin (PROSIGHT) TABS tablet Take 1 tablet by mouth daily. 30 each 0  . nitroGLYCERIN (NITROSTAT) 0.4 MG SL tablet Place 0.4 mg under the tongue every 5 (five) minutes as needed for chest pain.    . Nutritional Supplements (FEEDING SUPPLEMENT, NEPRO CARB STEADY,) LIQD Take 237 mLs by mouth daily.  0  . Pancrelipase, Lip-Prot-Amyl,  (CREON) 24000-76000 units CPEP Take 48,000 Units by mouth 3 (three) times daily with meals.     . polyethylene glycol (MIRALAX / GLYCOLAX) packet Take 17 g by mouth daily as needed for mild constipation (for constipation).     . prednisoLONE acetate (PRED FORTE) 1 % ophthalmic suspension Place 1 drop into both eyes at bedtime.     . traZODone (DESYREL) 50 MG tablet Take 50 mg by mouth at bedtime.    . VELPHORO 500 MG chewable tablet Chew 500 mg by mouth 2 (two) times daily.  3  . AURYXIA 1 GM 210 MG(Fe) tablet Take 2 tablets by mouth 3 (three) times daily with meals.  1  . cefTAZidime in dextrose 5 % 50 mL Dose to be given with dialysis for 10 days    . Darbepoetin Alfa (ARANESP) 150 MCG/0.3ML SOSY injection Inject 0.3 mLs (150 mcg total) into the vein every Tuesday with hemodialysis. 1.68 mL   . ferric gluconate 125 mg in sodium chloride 0.9 % 100 mL Inject 125 mg into the vein Every Tuesday,Thursday,and Saturday with dialysis.    Marland Kitchen vancomycin 500 mg in sodium chloride 0.9 % 100 mL Inject 500 mg into the vein every dialysis.      Objective: BP (!) 108/41   Pulse 72   Temp 98.5 F (36.9 C) (Oral)   Resp 11   Ht 5' 1"  (1.549 m)   Wt 118 lb (53.5 kg)   SpO2 100%   BMI 22.30 kg/m  Exam: General: Alert and cooperative and appears to be in no acute distress. Pt awake but clearly tired throughout interview.  Pt was receiving transfusion during interview. Eyes: EOMI, PERRL, Positive scleral icteris, positive conjunctival pallor Neck: Neck non-tender without lymphadenopathy, masses or thyromegaly.  Cardio: Normal A1 and S2, no S3 or S4. Rhythm is regular. 2-3/7 systolic murmur.   Pulm: Clear to auscultation bilaterally, positive crackles, wheezing, or diminished breath sounds. Normal respiratory effort Abdomen: Bowel sounds normal. Abdomen soft and non-tender.  Extremities: No peripheral edema. Warm/ well perfused.  Strong radial pulses. Neuro: Cranial nerves grossly intact, 5/5 strength in  LE. No focal deficits Skin: no rashes or lesions noted, appears pallid, slightly jaundiced  Psych: mood/affect appropriate   Labs and Imaging: CBC BMET  Recent Labs  Lab 11/29/17 1127 11/29/17 1203  WBC 7.4  --   HGB 5.8* 5.8*  HCT 18.4* 17.0*  PLT 66*  --    Recent Labs  Lab 11/29/17 1127 11/29/17 1203  NA 135 134*  K 3.6 3.6  CL 100 98  CO2 24  --   BUN 103* 118*  CREATININE 5.11* 5.20*  GLUCOSE 227* 217*  CALCIUM 7.5*  --      No results found.   Matilde Haymaker MD 11/29/2017, 1:55 PM PGY-1, [place] Intern pager: [phone], text pages welcome   FPTS Upper-Level Resident Addendum  I have independently interviewed and examined the patient. I have discussed the above with the original author and agree with their documentation. My edits for correction/addition/clarification are in pink.Please see also any attending notes.   Lucila Maine, DO PGY-2, [place] Service pager: [phone] (text pages welcome through [place])

## 2017-11-29 NOTE — ED Triage Notes (Signed)
Patient called by doctors office because of 5.9 Hgb. Per patient she has felt weakness for the last several days.

## 2017-11-29 NOTE — ED Provider Notes (Signed)
[facility] EMERGENCY DEPARTMENT Provider Note   CSN: [id number] Arrival date & time: 11/29/17  1122     History   Chief Complaint Chief Complaint  Patient presents with  . Weakness    HPI Samantha Conrad is a 77 y.o. female.  Patient is a 77 year old female with a history of end-stage renal disease on peritoneal dialysis at home, diet-controlled diabetes, hypertension, prior stroke who presents with low blood counts.  She states she is felt fatigued over the last week.  She had a routine appointment recently with her nephrologist who did some blood work.  She was noted to have a hemoglobin of 5.9 and was called this morning and advised to come to the emergency department for further treatment.  She has been feeling well fatigued and short of breath on minimal exertion.  She denies any chest pain.  No dizziness.  She denies any blood in her stool or melena.  No abdominal pain.  No fevers or other recent illnesses.  She was admitted about a year ago and had a hemoglobin at that time of 7.1 required a transfusion.     Past Medical History:  Diagnosis Date  . Allergic rhinitis   . Cancer ([place])    skin cancer - has had removed (basal cell carcinoma on nose/face)  . Constipation   . Depression   . Diabetes mellitus without complication ([place])    diet controlled now.   . Diarrhea due to drug   . Environmental and seasonal allergies   . ESRD (end stage renal disease) ([place])    Dialysis T/Th/Sa  . Fatty liver   . Headache    none since Stroke in Dec. 2017  . Hypertension   . Hypothyroidism   . NASH (nonalcoholic steatohepatitis)   . Seizures ([place])    ? when she may have had a stroke sometime in the spring of 2018  . Sleep apnea    diagnosed 6-7 years ago "but no longer a problem since weight loss"  . Stroke [facility]) 04/2016   memory issues, lost 1/2 vision in both eyes  . Thrombocytopenia ([place])   . Thyroid disease     Patient Active Problem List   Diagnosis  Date Noted  . Symptomatic anemia 03/11/2017  . Diet-controlled diabetes mellitus ([place]) 03/11/2017  . NASH (nonalcoholic steatohepatitis) 03/11/2017  . Hypertension 03/11/2017  . Hypothyroidism 03/11/2017  . CKD (chronic kidney disease) stage V requiring chronic dialysis ([place]) 03/11/2017  . Seizure disorder 03/11/2017  . Abdominal pain 03/11/2017  . HLD (hyperlipidemia) 03/11/2017  . Chronic pancreatitis ([place]) 03/11/2017  . Constipation 03/11/2017  . Laryngopharyngeal reflux (LPR) 02/17/2015  . H/O Mild allergic rhinitis 02/17/2015  . Cirrhosis ([place]) 02/17/2015    Past Surgical History:  Procedure Laterality Date  . ABDOMINAL HYSTERECTOMY    . AV FISTULA PLACEMENT Right 10/30/2016   Procedure: INSERTION OF ARTERIOVENOUS (AV) GORE-TEX GRAFT RIGHT UPPER ARM;  Surgeon: Elam Dutch, MD;  Location: [facility];  Service: Vascular;  Laterality: Right;  . CHOLECYSTECTOMY    . corneal implant bilateral Bilateral   . ROTATOR CUFF REPAIR Left   . THROMBECTOMY AND REVISION OF ARTERIOVENTOUS (AV) GORETEX  GRAFT Right 12/04/2016   Procedure: THROMBECTOMY/ REVISION OF RIGHT UPPER ARM ARTERIOVENOUS GORETEX GRAFT;  Surgeon: Elam Dutch, MD;  Location: [facility];  Service: Vascular;  Laterality: Right;  . TONSILLECTOMY       OB History   None      Home Medications  Prior to Admission medications   Medication Sig Start Date End Date Taking? Authorizing Provider  acetaminophen-codeine (TYLENOL #3) 300-30 MG tablet Take 1-2 tablets by mouth every 6 (six) hours as needed for moderate pain or severe pain. 12/04/16  Yes Virgina Jock A, PA-C  Amino Acids-Protein Hydrolys (FEEDING SUPPLEMENT, PRO-STAT SUGAR FREE 64,) LIQD Take 30 mLs by mouth 2 (two) times daily. 03/16/17  Yes Patrecia Pour, Christean Grief, MD  bisacodyl (DULCOLAX) 10 MG suppository Place 10 mg rectally daily as needed (for constipation.).    Yes [provider]  citalopram (CELEXA) 10 MG tablet Take 10 mg by mouth daily. 10/15/17   Yes [provider]  furosemide (LASIX) 40 MG tablet Take 40 mg by mouth 2 (two) times daily.   Yes [provider]  glipiZIDE (GLUCOTROL) 10 MG tablet Take 10 mg by mouth daily. 10/16/17  Yes [provider]  KLOR-CON M20 20 MEQ tablet Take 20 mEq by mouth daily. 09/25/17  Yes [provider]  levETIRAcetam (KEPPRA) 250 MG tablet Take 250 mg by mouth every 12 (twelve) hours. With breakfast & with supper.   Yes [provider]  levothyroxine (SYNTHROID, LEVOTHROID) 100 MCG tablet Take 100 mcg by mouth daily before breakfast.  09/02/16  Yes [provider]  linagliptin (TRADJENTA) 5 MG TABS tablet Take 5 mg by mouth daily.   Yes [provider]  losartan (COZAAR) 25 MG tablet Take 25 mg by mouth daily.   Yes [provider]  multivitamin (PROSIGHT) TABS tablet Take 1 tablet by mouth daily. 03/17/17  Yes Patrecia Pour, Christean Grief, MD  nitroGLYCERIN (NITROSTAT) 0.4 MG SL tablet Place 0.4 mg under the tongue every 5 (five) minutes as needed for chest pain.   Yes [provider]  Nutritional Supplements (FEEDING SUPPLEMENT, NEPRO CARB STEADY,) LIQD Take 237 mLs by mouth daily. 03/17/17  Yes Patrecia Pour, Christean Grief, MD  Pancrelipase, Lip-Prot-Amyl, (CREON) 24000-76000 units CPEP Take 4 capsules by mouth 3 (three) times daily with meals.    Yes [provider]  polyethylene glycol (MIRALAX / GLYCOLAX) packet Take 17 g by mouth daily as needed for mild constipation (for constipation).    Yes [provider]  prednisoLONE acetate (PRED FORTE) 1 % ophthalmic suspension Place 1 drop into both eyes at bedtime.    Yes [provider]  traZODone (DESYREL) 50 MG tablet Take 50 mg by mouth at bedtime.   Yes [provider]  VELPHORO 500 MG chewable tablet Chew 500 mg by mouth 2 (two) times daily. 10/17/17  Yes [provider]  cefTAZidime in dextrose 5 % 50 mL Dose to be given with dialysis for 10  days Patient not taking: Reported on 11/29/2017 03/16/17   Patrecia Pour, Christean Grief, MD  Darbepoetin Alfa (ARANESP) 150 MCG/0.3ML SOSY injection Inject 0.3 mLs (150 mcg total) into the vein every Tuesday with hemodialysis. Patient not taking: Reported on 11/29/2017 03/20/17   Patrecia Pour, Christean Grief, MD  ferric gluconate 125 mg in sodium chloride 0.9 % 100 mL Inject 125 mg into the vein Every Tuesday,Thursday,and Saturday with dialysis. Patient not taking: Reported on 11/29/2017 03/17/17   Patrecia Pour, Christean Grief, MD  vancomycin 500 mg in sodium chloride 0.9 % 100 mL Inject 500 mg into the vein every dialysis. Patient not taking: Reported on 11/29/2017 03/16/17   Doreatha Lew, MD    Family History No family history on file.  Social History Social History   Tobacco Use  . Smoking status: Never Smoker  .  Smokeless tobacco: Never Used  Substance Use Topics  . Alcohol use: No  . Drug use: No     Allergies   Contrast media [iodinated diagnostic agents]   Review of Systems Review of Systems  Constitutional: Positive for fatigue. Negative for chills, diaphoresis and fever.  HENT: Negative for congestion, rhinorrhea and sneezing.   Eyes: Negative.   Respiratory: Positive for shortness of breath. Negative for cough and chest tightness.   Cardiovascular: Negative for chest pain and leg swelling.  Gastrointestinal: Negative for abdominal pain, blood in stool, diarrhea, nausea and vomiting.  Genitourinary: Negative for difficulty urinating, flank pain, frequency and hematuria.  Musculoskeletal: Negative for arthralgias and back pain.  Skin: Negative for rash.  Neurological: Positive for weakness. Negative for dizziness, speech difficulty, numbness and headaches.     Physical Exam Updated Vital Signs BP (!) 114/47   Pulse 73   Temp 98.5 F (36.9 C) (Oral)   Resp 15   Ht 5' 1"  (1.549 m)   Wt 53.5 kg (118 lb)   SpO2 100%   BMI 22.30 kg/m   Physical Exam  Constitutional: She is oriented  to person, place, and time. She appears well-developed and well-nourished.  HENT:  Head: Normocephalic and atraumatic.  Eyes: Pupils are equal, round, and reactive to light.  Neck: Normal range of motion. Neck supple.  Cardiovascular: Normal rate, regular rhythm and normal heart sounds.  Pulmonary/Chest: Effort normal and breath sounds normal. No respiratory distress. She has no wheezes. She has no rales. She exhibits no tenderness.  Abdominal: Soft. Bowel sounds are normal. There is no tenderness. There is no rebound and no guarding.  Genitourinary:  Genitourinary Comments: Rectal exam shows brown stool  Musculoskeletal: Normal range of motion. She exhibits no edema.  Lymphadenopathy:    She has no cervical adenopathy.  Neurological: She is alert and oriented to person, place, and time.  Skin: Skin is warm and dry. No rash noted.  Psychiatric: She has a normal mood and affect.     ED Treatments / Results  Labs (all labs ordered are listed, but only abnormal results are displayed) Labs Reviewed  BASIC METABOLIC PANEL - Abnormal; Notable for the following components:      Result Value   Glucose, Bld 227 (*)    BUN 103 (*)    Creatinine, Ser 5.11 (*)    Calcium 7.5 (*)    GFR calc non Af Amer 7 (*)    GFR calc Af Amer 8 (*)    All other components within normal limits  CBC WITH DIFFERENTIAL/PLATELET - Abnormal; Notable for the following components:   RBC 1.93 (*)    Hemoglobin 5.8 (*)    HCT 18.4 (*)    Platelets 66 (*)    All other components within normal limits  HEPATIC FUNCTION PANEL - Abnormal; Notable for the following components:   Total Protein 4.9 (*)    Albumin 2.7 (*)    All other components within normal limits  I-STAT CHEM 8, ED - Abnormal; Notable for the following components:   Sodium 134 (*)    BUN 118 (*)    Creatinine, Ser 5.20 (*)    Glucose, Bld 217 (*)    Calcium, Ion 0.98 (*)    Hemoglobin 5.8 (*)    HCT 17.0 (*)    All other components within  normal limits  POC OCCULT BLOOD, ED  TYPE AND SCREEN  PREPARE RBC (CROSSMATCH)    EKG EKG Interpretation  Date/Time:  Thursday November 29 2017 13:42:31 EDT Ventricular Rate:  72 PR Interval:    QRS Duration: 90 QT Interval:  433 QTC Calculation: 474 R Axis:   23 Text Interpretation:  Sinus or ectopic atrial rhythm Prolonged PR interval Low voltage, precordial leads Baseline wander in lead(s) V1 No old tracing to compare Confirmed by Malvin Johns [phone]) on 11/29/2017 1:46:17 PM   Radiology No results found.  Procedures Procedures (including critical care time)  Medications Ordered in ED Medications  0.9 %  sodium chloride infusion (Manually program via Guardrails IV Fluids) (has no administration in time range)  heparin 1000 unit/ml injection 500 Units (has no administration in time range)  gentamicin cream (GARAMYCIN) 0.1 % 1 application (has no administration in time range)  dialysis solution 2.5% low-MG/low-CA dianeal solution (has no administration in time range)  sucroferric oxyhydroxide (VELPHORO) chewable tablet 500 mg (has no administration in time range)  calcitRIOL (ROCALTROL) capsule 0.25 mcg (has no administration in time range)  Darbepoetin Alfa (ARANESP) injection 150 mcg (has no administration in time range)     Initial Impression / Assessment and Plan / ED Course  I have reviewed the triage vital signs and the nursing notes.  Pertinent labs & imaging results that were available during my care of the patient were reviewed by me and considered in my medical decision making (see chart for details).     Patient is a 77 year old female who presents with generalized weakness fatigue and shortness of breath.  Her hemoglobin is 5.8.  Her Hemoccult is negative.  Her vital signs are stable.  She was typed and crossed for 2 units of packed red cells.  I spoke with the family medicine resident who is on-call for unassigned medicine who will admit the patient for further  treatment.  Her PCP is [facility] in [place].  CRITICAL CARE Performed by: Malvin Johns Total critical care time: 45 minutes Critical care time was exclusive of separately billable procedures and treating other patients. Critical care was necessary to treat or prevent imminent or life-threatening deterioration. Critical care was time spent personally by me on the following activities: development of treatment plan with patient and/or surrogate as well as nursing, discussions with consultants, evaluation of patient's response to treatment, examination of patient, obtaining history from patient or surrogate, ordering and performing treatments and interventions, ordering and review of laboratory studies, ordering and review of radiographic studies, pulse oximetry and re-evaluation of patient's condition.   Final Clinical Impressions(s) / ED Diagnoses   Final diagnoses:  Symptomatic anemia    ED Discharge Orders    None       Malvin Johns, MD 11/29/17 1511

## 2017-11-30 ENCOUNTER — Other Ambulatory Visit: Payer: Self-pay | Admitting: Family Medicine

## 2017-11-30 DIAGNOSIS — E1122 Type 2 diabetes mellitus with diabetic chronic kidney disease: Secondary | ICD-10-CM | POA: Diagnosis not present

## 2017-11-30 DIAGNOSIS — D649 Anemia, unspecified: Secondary | ICD-10-CM | POA: Diagnosis not present

## 2017-11-30 LAB — TYPE AND SCREEN
ABO/RH(D): A POS
Antibody Screen: NEGATIVE
UNIT DIVISION: 0
Unit division: 0

## 2017-11-30 LAB — BPAM RBC
BLOOD PRODUCT EXPIRATION DATE: 201907112359
Blood Product Expiration Date: 201907112359
ISSUE DATE / TIME: 201907041613
ISSUE DATE / TIME: 201907041323
UNIT TYPE AND RH: 6200
Unit Type and Rh: 6200

## 2017-11-30 LAB — LACTATE DEHYDROGENASE: LDH: 208 U/L — AB (ref 98–192)

## 2017-11-30 LAB — BASIC METABOLIC PANEL
Anion gap: 11 (ref 5–15)
BUN: 96 mg/dL — AB (ref 8–23)
CHLORIDE: 104 mmol/L (ref 98–111)
CO2: 24 mmol/L (ref 22–32)
CREATININE: 4.9 mg/dL — AB (ref 0.44–1.00)
Calcium: 7.5 mg/dL — ABNORMAL LOW (ref 8.9–10.3)
GFR calc Af Amer: 9 mL/min — ABNORMAL LOW (ref >60)
GFR calc non Af Amer: 8 mL/min — ABNORMAL LOW (ref >60)
GLUCOSE: 77 mg/dL (ref 70–99)
Potassium: 3.3 mmol/L — ABNORMAL LOW (ref 3.5–5.1)
Sodium: 139 mmol/L (ref 135–145)

## 2017-11-30 LAB — CBC
HEMATOCRIT: 23.9 % — AB (ref 36.0–46.0)
Hemoglobin: 8.2 g/dL — ABNORMAL LOW (ref 12.0–15.0)
MCH: 30.5 pg (ref 26.0–34.0)
MCHC: 34.3 g/dL (ref 30.0–36.0)
MCV: 88.8 fL (ref 78.0–100.0)
PLATELETS: 54 10*3/uL — AB (ref 150–400)
RBC: 2.69 MIL/uL — ABNORMAL LOW (ref 3.87–5.11)
RDW: 13.2 % (ref 11.5–15.5)
WBC: 6.8 10*3/uL (ref 4.0–10.5)

## 2017-11-30 LAB — GLUCOSE, CAPILLARY
GLUCOSE-CAPILLARY: 93 mg/dL (ref 70–99)
Glucose-Capillary: 70 mg/dL (ref 70–99)

## 2017-11-30 MED ORDER — CALCITRIOL 0.25 MCG PO CAPS: 0.2500 ug | ORAL_CAPSULE | Freq: Every day | ORAL | 0 refills | Status: DC

## 2017-11-30 MED ORDER — POTASSIUM CHLORIDE CRYS ER 20 MEQ PO TBCR: 20.0000 meq | EXTENDED_RELEASE_TABLET | Freq: Two times a day (BID) | ORAL | 0 refills | Status: DC

## 2017-11-30 MED ORDER — POTASSIUM CHLORIDE CRYS ER 20 MEQ PO TBCR
20.0000 meq | EXTENDED_RELEASE_TABLET | Freq: Two times a day (BID) | ORAL | Status: DC
  Administered 2017-11-30: 20 meq via ORAL
  Filled 2017-11-30: qty 1

## 2017-11-30 MED ORDER — CALCIUM ACETATE (PHOS BINDER) 667 MG PO CAPS: 1334.0000 mg | ORAL_CAPSULE | Freq: Three times a day (TID) | ORAL | 0 refills | Status: DC

## 2017-11-30 MED ORDER — CALCIUM ACETATE (PHOS BINDER) 667 MG PO CAPS
1334.0000 mg | ORAL_CAPSULE | Freq: Three times a day (TID) | ORAL | Status: DC
  Administered 2017-11-30: 1334 mg via ORAL
  Filled 2017-11-30: qty 2

## 2017-11-30 MED ORDER — DARBEPOETIN ALFA 60 MCG/0.3ML IJ SOSY
60.0000 ug | PREFILLED_SYRINGE | Freq: Once | INTRAMUSCULAR | Status: AC
  Administered 2017-11-30: 60 ug via SUBCUTANEOUS
  Filled 2017-11-30: qty 0.3

## 2017-11-30 NOTE — Care Management Note (Signed)
Case Management Note  Patient Details  Name: ANNICA MARINELLO MRN: [id number] Date of Birth: 13-Feb-1941  Subjective/Objective:    77 yr old female admitted with anemia, hx of ESRD, has residual memory loss from stroke 2017.                Action/Plan: Case manager spoke with patient's son concerning [place] agency. He states they are active with [facility], CM called intake and faxed orders, F2F , to [phone].   Expected Discharge Date:    11/30/17              Expected Discharge Plan:  [place]  In-House Referral:  NA  Discharge planning Services  CM Consult  Post Acute Care Choice:  Home Health Choice offered to:  Adult Children  DME Arranged:  N/A DME Agency:  NA  HH Arranged:  Speech Therapy HH Agency:  [facility]  Status of Service:  Completed, signed off  If discussed at [place] of Stay Meetings, dates discussed:    Additional Comments:  Ninfa Meeker, RN 11/30/2017, 4:23 PM

## 2017-11-30 NOTE — Progress Notes (Signed)
Family Medicine Teaching Service Daily Progress Note Intern Pager: [phone]  Patient name: Samantha Conrad Medical record number: [id number] Date of birth: [birth year] Age: 77 y.o. Gender: female  Primary Care Provider: Physicians, Di [facility] Family Consultants: Nephrology, GI Code Status: DNR  Pt Overview and Major Events to Date:  7/4 Admitted with symptomatic anemia, Hgb of 5.8 7/4 Received 2 units pRBC  Assessment and Plan:  Samantha Conrad is a 78 y.o. female presenting with weakness. PMH is significant for ESRD On PD QHS, NASH, T2DM.  Pt presented to the ED after receiving lab results or a Hgb of 5.8.  Symptomatic anemia - history of 2 weeks of fatigue, SOB with walking around house. No active bleeding per history and negative FOBT. Likely anemia of chronic disease 2/2 ESRD. History of ESRD, pt has not had a Hgb above 10 since before 02/2017. Per med review it appears Epo was stopped recently, unclear why. Patient's last colonoscopy in 2006 with some polyp removal by [facility]. Would like to rule out occult malignancy as cause for drop in Hgb. Per family, patient saw hematologist recently due to consistently low platelets and he said he would follow her annually, unable to see these records in EMR. Patient is hemodynamincally stable, S/P 2 units pRBC, Hgb this AM 8.2. Orthostatic vital signs within normal range on 7/5. - FOBT negative - f/u GI for outpatient colonoscopy - will reach out to patient's hematologist for records, cannot see in [place] - PT/OT/speech consult today for home health - D/C home today  ESRD- Pt administers own PD at home QHS.  K 3.3 on 7.4. - nephro is following, appreciate recs - pt to receive PD qhs - will discuss with nephro restarting darbepoetin  - phosphate binder - calcitriol - Nephro recommends work-up for immune mediated hematologic etiology (Evans Syndrome) - will contact hematologist to determine pt's workup and consider outpatient workup    Thrombocytopenia- Likely 2/2 Cirrhosis. Per family, pt has received 2 platelet transfusions in the past and follows annually with hematologist.  - monitor platelets - consider transfuse for <50 - f/u with hematologist outpatient  Cirrhosis- Known history of NASH, pt has received platelet transfusions at least twice in the past. Platelets 54 on CBC from today. Patient appears slightly jaundiced in ED, AST 24, ALT 18, and Total Bili 0.6 on 7/4. -monitor LFTs and enzymes -monitor platelets   T2DM- last A1C 6.8 in October 2018, on glipizide and tradjenta at home.  - sSSI - monitor CBGs AC/HS  HTN- chronic, stable, BP 100/43 this AM Losartan 25 mg  Hypothyroid- chronic, stable -continue home Levothyroxine  Seizure- chronic, stable. On keppra -continue home [place]  Hx Stroke Jan 2017 with residual memory deficits and vision loss- lives alone with close family support however patient seems to struggle with ADL's -f/u PT/OT recs  Chronic pancreatitis- chronic, stable -continue home Creon   Constipation- chronic -continue home Bisacodyl  Protein calorie malnutrition- chronic -Continue prostat sugar free BID  Depression- chronic, stable -Citalopram 10 mg QD  FEN/GI: renal diet Prophylaxis: SCDs   Disposition: Home  Subjective:  Patient denies any current complaints.  States that she got up last PM with help to go to restroom and denies dizziness.  Denies complaints and states that she is feeling much better than yesterday.  Objective: Temp:  [97.3 F (36.3 C)-98.9 F (37.2 C)] 98.2 F (36.8 C) (07/05 0456) Pulse Rate:  [65-81] 65 (07/05 0456) Resp:  [8-21] 18 (07/05 0456) BP: (89-125)/(34-57) 100/43 (  07/05 0456) SpO2:  [99 %-100 %] 100 % (07/05 0456) Weight:  [118 lb (53.5 kg)-126 lb 12.2 oz (57.5 kg)] 126 lb 12.2 oz (57.5 kg) (07/04 2122)   Physical Exam: General: 77 yo female in NAD, lying in bed Cardiovascular: RRR, 2/6 murmur Respiratory: CTAB,  minimal bibasilar crackles Abdomen: Soft, non-tender, + BS Extremities: No edema Skin: Warm and dry  Laboratory: Recent Labs  Lab 11/29/17 1127 11/29/17 1203 11/29/17 2141 11/30/17 0447  WBC 7.4  --  7.1 6.8  HGB 5.8* 5.8* 8.1* 8.2*  HCT 18.4* 17.0* 23.8* 23.9*  PLT 66*  --  53* 54*   Recent Labs  Lab 11/29/17 1127 11/29/17 1203 11/30/17 0447  NA 135 134* 139  K 3.6 3.6 3.3*  CL 100 98 104  CO2 24  --  24  BUN 103* 118* 96*  CREATININE 5.11* 5.20* 4.90*  CALCIUM 7.5*  --  7.5*  PROT 4.9*  --   --   BILITOT 0.6  --   --   ALKPHOS 68  --   --   ALT 18  --   --   AST 24  --   --   GLUCOSE 227* 217* 77    Imaging/Diagnostic Tests: FOBT Negative  Rittberger, Bernita Raisin, DO 11/30/2017, 7:28 AM PGY-1, [place] Intern pager: [phone], text pages welcome

## 2017-11-30 NOTE — Progress Notes (Signed)
Occupational Therapy Evaluation Patient Details Name: Samantha Conrad MRN: [id number] DOB: May 30, 1940 Today's Date: 11/30/2017    History of Present Illness Pt is a 77 y.o. 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   Clinical Impression   PTA, pt was living at home alone, and was independent with ADLs and received assistance from family for IADLs and community mobility. PTA, pt was receiving services from [facility] SLP to address cognitive and visual deficits secondary to stroke in 2017. Pt currently requires S for functional mobility, shower transfers, and toilet transfers, otherwise pt appears to be functioning at baseline. Pt would benefit from continued services with [facility] SLP to continue to address cognitive deficits. All education has been completed and the patient and family have no further questions. Pt to d/c home when medically appropriate. OT to sign off. Thank you for referral.       Follow Up Recommendations  No OT follow up;Supervision - Intermittent    Equipment Recommendations  None recommended by OT    Recommendations for Other Services       Precautions / Restrictions Precautions Precautions: Fall Precaution Comments: right homonymous hemianopsia  Restrictions Weight Bearing Restrictions: No      Mobility Bed Mobility Overal bed mobility: Independent                Transfers Overall transfer level: Modified independent Equipment used: None                  Balance Overall balance assessment: Needs assistance Sitting-balance support: No upper extremity supported;Feet supported Sitting balance-Leahy Scale: Normal     Standing balance support: No upper extremity supported;During functional activity Standing balance-Leahy Scale: Fair Standing balance comment: S with high level dynamic activities;                      ADL either performed  or assessed with clinical judgement   ADL Overall ADL's : Needs assistance/impaired                                     Functional mobility during ADLs: Supervision/safety General ADL Comments: pt ambulated from bed to toilet without use of AD;pt required S for toilet transfers and shower transfers;pt able to adjust socks while sitting EOB;pt/family report setting items in house at countertop level to assist pt with ADLs;educated pt/family on importance of S during shower initially after d.c.     Vision Baseline Vision/History: (R homogenous hemianopsia) Patient Visual Report: No change from baseline Additional Comments: pt demonstrates fair compensatory strategies;reports bumping into objects in unfamiliar environments;educated on importance of scanning and visual compensatory strategies     Perception     Praxis      Pertinent Vitals/Pain Pain Assessment: No/denies pain     Hand Dominance Right   Extremity/Trunk Assessment Upper Extremity Assessment Upper Extremity Assessment: Overall WFL for tasks assessed   Lower Extremity Assessment Lower Extremity Assessment: Defer to PT evaluation RLE Deficits / Details: MMT: 5/5 throughout LLE Deficits / Details: MMT: 5/5 throughout   Cervical / Trunk Assessment Cervical / Trunk Assessment: Normal   Communication Communication Communication: No difficulties   Cognition Arousal/Alertness: Awake/alert Behavior During Therapy: WFL for tasks assessed/performed Overall Cognitive Status: History of cognitive impairments - at baseline  General Comments: history of short term memory deficits from prior stroke   General Comments  pt's son and daughter present during session;educated pt/family on importance of S initially after d/c;educated family on additional environmental modifications to decrease fall risk;son and daughter able to provide intermittent supervision report  visiting pt 5/7 days per week in the afternoon.     Exercises     Shoulder Instructions      Home Living Family/patient expects to be discharged to:: Private residence Living Arrangements: Children Available Help at Discharge: Family Type of Home: House Home Access: Level entry     Home Layout: One level     Bathroom Shower/Tub: Teacher, early years/pre: Standard     Home Equipment: Environmental consultant - 2 wheels;Shower seat;Toilet riser      Lives With: Alone    Prior Functioning/Environment Level of Independence: Needs assistance  Gait / Transfers Assistance Needed: has supervision for community ambulation due to visual deficits ADL's / Homemaking Assistance Needed: independent with ADL's, patient family provides assistance for IADL's i.e. cooking, cleaning;duaghter in-law assists with filling pill containers;family reports pt has recent difficulty with medication management, dropping pills and not taking them;pt's son reported SLP pt had at home was assisting with memory deficits            OT Problem List: Decreased strength;Decreased activity tolerance;Impaired vision/perception;Decreased cognition      OT Treatment/Interventions:      OT Goals(Current goals can be found in the care plan section) Acute Rehab OT Goals Patient Stated Goal: to go home OT Goal Formulation: With patient Time For Goal Achievement: 12/14/17 Potential to Achieve Goals: Good  OT Frequency:     Barriers to D/C:            Co-evaluation              AM-PAC PT "6 Clicks" Daily Activity     Outcome Measure Help from another person eating meals?: None Help from another person taking care of personal grooming?: A Little Help from another person toileting, which includes using toliet, bedpan, or urinal?: None Help from another person bathing (including washing, rinsing, drying)?: A Little Help from another person to put on and taking off regular upper body clothing?: None Help  from another person to put on and taking off regular lower body clothing?: None 6 Click Score: 22   End of Session Equipment Utilized During Treatment: Gait belt Nurse Communication: Mobility status  Activity Tolerance: Patient tolerated treatment well Patient left: in bed;with call bell/phone within reach;with family/visitor present  OT Visit Diagnosis: Unsteadiness on feet (R26.81);Other abnormalities of gait and mobility (R26.89);Low vision, both eyes (H54.2)                Time: 3976-7341 OT Time Calculation (min): 15 min Charges:    G-Codes:     Dorinda Hill OTS    Dorinda Hill 11/30/2017, 4:22 PM

## 2017-11-30 NOTE — Discharge Instructions (Signed)
Stop taking your Velphoro. Start taking Calcium acetate.  Please follow up with your Hematologist Dr. Bobby Rumpf.

## 2017-11-30 NOTE — Discharge Summary (Signed)
[facility] Discharge Summary  Patient name: Samantha Conrad Medical record number: [id number] Date of birth: 02-Apr-1941 Age: 77 y.o. Gender: female Date of Admission: 11/29/2017  Date of Discharge: 11/30/2017 Admitting Physician: Leeanne Rio, MD  Primary Care Provider: Physicians, [facility] Family Consultants: Nephro, GI  Indication for Hospitalization: Symptomatic Anemia  Discharge Diagnoses/Problem List:  1. Symptomatic Anemia 2. ESRD 3. Thrombocytopenia 4. Cirrhosis 5. T2DM 6. HTN 7. Hypothyroidism 8. Seizure 9. Hx Stroke 10. Chronic Pancreatitis 11. Constipation 12. Protein Calorie malnutrition 13. Depression  Disposition: Home with home health SLP  Discharge Condition: Stable  Discharge Exam:   Physical Exam: General: 77 yo female in NAD, lying in bed Cardiovascular: RRR, 2/6 murmur Respiratory: CTAB, minimal bibasilar crackles Abdomen: Soft, non-tender, + BS Extremities: No edema Skin: Warm and dry    Brief Hospital Course:  Samantha Conrad a 77 y.o.femalepresenting with weakness. PMH is significant forESRDOn PD QHS,NASH,T2DM. Pt presented to the ED after receiving lab results or a Hgb of 5.8.  She presented to the ED with 2 weeks of fatigue and SOB while walking around the house.  She denied any active bleeding and FOBT in the ED was negative, due to this it is likely that the cause of the anemia is anemia of chronic disease secondary to ESRD.  The patient's hemoglobin has not been above 10 since before 02/2017.  Erythropoietin has been stopped recently, but the reason was unclear in the record.  Patient's last colonoscopy was done in 2006 with some polyp removal.  GI was called and they recommended an outpatient colonoscopy.  The patient's family also reported that she had recently seen a hematologist for her consistent thrombocytopenia and he had recommended follow up in 1 year, records were unable to be obtained  from EMR.  The patient remained hemodynamically stable throughout her stay at the hospital.  She received 2 units pRBC and Hgb improved to 8.1 following the transfusion.  On 7/5 Hgb was 8.2 and patient was asymptomatic and hemodynamically stable at discharge.  Nephrology was consulted for management of her peritoneal dialysis.  They recommended a workup for immune mediated hemoatolgic etiology (Evan's  Syndrome) as well as checking an LDH and Haptoglobin.  LDH was 208 on 7/5.  Haptoglobin still pending at discharge as is a 2 day send out lab.  Spoke with Nephrology and it was agreed it would be medically inappropriate to keep this patient who is otherwise stable and ready for discharge.  They also recommended change from Velphoro to Calcium acetate.     Issues for Follow Up:  1. Will need frequent hemoglobin checks as an outpatient.  2. Follow up haptoglobin results. 3. Unable to schedule colonoscopy as office is closed.  Please schedule.  4. Unable to contact Dr. Lavera Guise, Hematologist, due to office being closed.  Unable to leave a message.  Please schedule follow up appointment for Anemia and thrombocytopenia.  Nephrology recommends work up for immune mediated hematologic etiology (Evans Syndrome) 5. Velphoro changed to Calcium acetate.  Significant Procedures: S/P 2 units blood transfusion  Significant Labs and Imaging:  Recent Labs  Lab 11/29/17 1127 11/29/17 1203 11/29/17 2141 11/30/17 0447  WBC 7.4  --  7.1 6.8  HGB 5.8* 5.8* 8.1* 8.2*  HCT 18.4* 17.0* 23.8* 23.9*  PLT 66*  --  53* 54*   Recent Labs  Lab 11/29/17 1127 11/29/17 1203 11/30/17 0447  NA 135 134* 139  K 3.6 3.6 3.3*  CL  100 98 104  CO2 24  --  24  GLUCOSE 227* 217* 77  BUN 103* 118* 96*  CREATININE 5.11* 5.20* 4.90*  CALCIUM 7.5*  --  7.5*  ALKPHOS 68  --   --   AST 24  --   --   ALT 18  --   --   ALBUMIN 2.7*  --   --       Results/Tests Pending at Time of Discharge: Haptoglobin  Discharge  Medications:  Allergies as of 11/30/2017      Reactions   Contrast Media [iodinated Diagnostic Agents] Swelling   SWELLING REACTION UNSPECIFIED       Medication List    STOP taking these medications   cefTAZidime in dextrose 5 % 50 mL   Darbepoetin Alfa 150 MCG/0.3ML Sosy injection Commonly known as:  ARANESP   ferric gluconate 125 mg in sodium chloride 0.9 % 100 mL   vancomycin 500 mg in sodium chloride 0.9 % 100 mL   VELPHORO 500 MG chewable tablet Generic drug:  sucroferric oxyhydroxide     TAKE these medications   acetaminophen-codeine 300-30 MG tablet Commonly known as:  TYLENOL #3 Take 1-2 tablets by mouth every 6 (six) hours as needed for moderate pain or severe pain.   bisacodyl 10 MG suppository Commonly known as:  DULCOLAX Place 10 mg rectally daily as needed (for constipation.).   calcitRIOL 0.25 MCG capsule Commonly known as:  ROCALTROL Take 1 capsule (0.25 mcg total) by mouth daily. Start taking on:  12/01/2017   calcium acetate 667 MG capsule Commonly known as:  PHOSLO Take 2 capsules (1,334 mg total) by mouth 3 (three) times daily with meals.   citalopram 10 MG tablet Commonly known as:  CELEXA Take 10 mg by mouth daily.   CREON 24000-76000 units Cpep Generic drug:  Pancrelipase (Lip-Prot-Amyl) Take 4 capsules by mouth 3 (three) times daily with meals.   feeding supplement (NEPRO CARB STEADY) Liqd Take 237 mLs by mouth daily.   feeding supplement (PRO-STAT SUGAR FREE 64) Liqd Take 30 mLs by mouth 2 (two) times daily.   furosemide 40 MG tablet Commonly known as:  LASIX Take 40 mg by mouth 2 (two) times daily.   glipiZIDE 10 MG tablet Commonly known as:  GLUCOTROL Take 10 mg by mouth daily.   levETIRAcetam 250 MG tablet Commonly known as:  KEPPRA Take 250 mg by mouth every 12 (twelve) hours. With breakfast & with supper.   levothyroxine 100 MCG tablet Commonly known as:  SYNTHROID, LEVOTHROID Take 100 mcg by mouth daily before breakfast.    linagliptin 5 MG Tabs tablet Commonly known as:  TRADJENTA Take 5 mg by mouth daily.   losartan 25 MG tablet Commonly known as:  COZAAR Take 25 mg by mouth daily.   multivitamin Tabs tablet Take 1 tablet by mouth daily.   nitroGLYCERIN 0.4 MG SL tablet Commonly known as:  NITROSTAT Place 0.4 mg under the tongue every 5 (five) minutes as needed for chest pain.   polyethylene glycol packet Commonly known as:  MIRALAX / GLYCOLAX Take 17 g by mouth daily as needed for mild constipation (for constipation).   potassium chloride SA 20 MEQ tablet Commonly known as:  K-DUR,KLOR-CON Take 1 tablet (20 mEq total) by mouth 2 (two) times daily. What changed:  when to take this   prednisoLONE acetate 1 % ophthalmic suspension Commonly known as:  PRED FORTE Place 1 drop into both eyes at bedtime.   traZODone 50 MG  tablet Commonly known as:  DESYREL Take 50 mg by mouth at bedtime.       Discharge Instructions: Please refer to Patient Instructions section of EMR for full details.  Patient was counseled important signs and symptoms that should prompt return to medical care, changes in medications, dietary instructions, activity restrictions, and follow up appointments.   Follow-Up Appointments: Follow-up Information    Physicians, [facility]. Go on 12/05/2017.   Specialty:  Family Medicine Why:  2:30pm Contact information: [place] [address] [address] Follow up.   Specialty:  [place] Why:  A representative from [facility] will contact you to arrange start date and time for your therapy. Contact information: [address] [place] [address] [phone]           Rittberger, Bernita Raisin, DO 11/30/2017, 4:32 PM PGY-1, [place]

## 2017-11-30 NOTE — Progress Notes (Addendum)
[facility] Progress Note   Dialysis Orders: CCPD 7x/week EDW 51.5kg  3 exchanges Fill volume 2014m  Dwell time 1.5h  No day dwell No pauses  Outpatient Labs 7/1: Hgb 5.9, Ferritin 1782, Tsat unable to calc,  Plts 72, Ca 7.8, Corr Ca 8.5, Phos 6.2, Alb 3.1  PTH 403    Assessment/Plan: 1. Symptomatic anemia - hgb 5.8 > 8.2 post 2 units PRBC. Disproportionate hgb drop for only 4 weeks - FOBT neg Aranesp 150 to be given today; She had been off of ESA due to hgb in the 14 - 15 range in April and May down to 11.5 in June.Total Fe was 258 and ferritin 1782 with - 7/1 labs likely related to taking Velphoro - she probably needs to change to a non iron based binder - She very well could have have a transient bleed between 6/3 and 7/1 and Fe based binders could have masked it. Since her hgb was only being checked monthly,  ESA would not have been given for hgb of 11.5.  Appropriately being resumed now - plan closer monitoring of Hgb after d/c. Further work up to be directed by FMTS 2. ESRD - CCPD K 3.3  - Rx KCl 40 today  High BUN not explained in setting of neg hemocult BUN  increasing over several months - Cr less so - little change in BUN/Cr overnight - but may not have had the full PD when labs were drawn - had all 2.5's last night - discuss with Dr. PPosey Pronto- only on 3 exchanges - likely needs more 3. Secondary hyperparathyroidism - on calcitriol/velphoro- stop velphoro - change to ca acetate 2 ac  4. HTN/volume - on furosemide - k ok BP in goal net UF 540 ml -  5. Nutrition -liberalize to carb mod/heart healthy only diet to allow more K 6  Chronic thrombocytopenia - plts 66 K > 54 today (40 -70 in outpt setting)  7. NASH- 8. DNR   MMyriam Jacobson PA-C [facility]per [phone]/09/2017,9:43 AM  LOS: 1 day   Subjective:   Slept through the night - feels better  Objective Vitals:   11/29/17 1855 11/29/17 1857 11/29/17 2122 11/30/17 0456  BP:  (!) 120/49  (!) 125/53 (!) 100/43  Pulse: 71 73 72 65  Resp:  (!) 21 18 18   Temp:  98.5 F (36.9 C) 98.8 F (37.1 C) 98.2 F (36.8 C)  TempSrc:  Oral Oral Oral  SpO2: 100% 100% 100% 100%  Weight:   57.5 kg (126 lb 12.2 oz)   Height:       Physical Exam General: NAD Heart: RRR Lungs: no rales Abdomen: soft NT Extremities: no LE edema Dialysis Access:  PD cath   Additional Objective Labs: Basic Metabolic Panel: Recent Labs  Lab 11/29/17 1127 11/29/17 1203 11/30/17 0447  NA 135 134* 139  K 3.6 3.6 3.3*  CL 100 98 104  CO2 24  --  24  GLUCOSE 227* 217* 77  BUN 103* 118* 96*  CREATININE 5.11* 5.20* 4.90*  CALCIUM 7.5*  --  7.5*   Liver Function Tests: Recent Labs  Lab 11/29/17 1127  AST 24  ALT 18  ALKPHOS 68  BILITOT 0.6  PROT 4.9*  ALBUMIN 2.7*   No results for input(s): LIPASE, AMYLASE in the last 168 hours. CBC: Recent Labs  Lab 11/29/17 1127 11/29/17 1203 11/29/17 2141 11/30/17 0447  WBC 7.4  --  7.1 6.8  NEUTROABS 5.7  --   --   --  HGB 5.8* 5.8* 8.1* 8.2*  HCT 18.4* 17.0* 23.8* 23.9*  MCV 95.3  --  90.2 88.8  PLT 66*  --  53* 54*   Blood Culture    Component Value Date/Time   SDES PERITONEAL DIALYSATE 03/13/2017 1400   SPECREQUEST Immunocompromised 03/13/2017 1400   CULT NO GROWTH 3 DAYS 03/13/2017 1400   REPTSTATUS 03/17/2017 FINAL 03/13/2017 1400    Cardiac Enzymes: No results for input(s): CKTOTAL, CKMB, CKMBINDEX, TROPONINI in the last 168 hours. CBG: Recent Labs  Lab 11/29/17 1700 11/29/17 1729 11/29/17 1756 11/29/17 2119 11/30/17 0806  GLUCAP 55* 64* 83 122* 70   Iron Studies: No results for input(s): IRON, TIBC, TRANSFERRIN, FERRITIN in the last 72 hours. Lab Results  Component Value Date   INR 1.02 03/11/2017   Studies/Results: No results found. Medications: . dialysis solution 2.5% low-MG/low-CA     . calcitRIOL  0.25 mcg Oral Daily  . citalopram  10 mg Oral Daily  . darbepoetin (ARANESP) injection - NON-DIALYSIS  150  mcg Subcutaneous Q Fri-1800  . feeding supplement (PRO-STAT SUGAR FREE 64)  30 mL Oral BID  . furosemide  40 mg Oral BID  . gentamicin cream  1 application Topical Daily  . insulin aspart  0-5 Units Subcutaneous QHS  . insulin aspart  0-9 Units Subcutaneous TID WC  . levETIRAcetam  250 mg Oral Q12H  . levothyroxine  100 mcg Oral QAC breakfast  . lipase/protease/amylase  24,000 Units Oral TID WC  . losartan  25 mg Oral Daily  . multivitamin  1 tablet Oral Daily  . prednisoLONE acetate  1 drop Both Eyes QHS  . sucroferric oxyhydroxide  500 mg Oral TID WC  . traZODone  50 mg Oral QHS

## 2017-11-30 NOTE — Progress Notes (Signed)
Patient discharged to home with son. After visit Summary reviewed. Patient capable of reverbalizing medications and follow up visits. No signs and symptoms of distress noted. Patient educated to return to the ED in the case of an emergency. Dillon Bjork RN

## 2017-11-30 NOTE — Progress Notes (Signed)
PD tx initiated via tenchoff w/o problem, VSS, , report given to Maryruth Hancock, RN

## 2017-11-30 NOTE — Evaluation (Signed)
Physical Therapy Evaluation Patient Details Name: Samantha Conrad MRN: [id number] DOB: 21-Jun-1940 Today's Date: 11/30/2017   History of Present Illness  Pt is a 77 y.o. 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  Clinical Impression  Patient evaluated by Physical Therapy with no further acute PT needs identified. At baseline, patient lives alone with assistance for IADL's and community ambulation. Has visual and cognitive deficits at baseline secondary to a stroke in 2017. Patient ambulated > 400 feet with no assistive device without difficulty. Scoring 19/24 on the Dynamic Gait Index, indicating that patient does have mild balance deficits but is not at high risk for falls. All education has been completed and the patient has no further questions. No follow-up Physical Therapy or equipment needs. PT is signing off. Thank you for this referral.     Follow Up Recommendations No PT follow up    Equipment Recommendations  None recommended by PT    Recommendations for Other Services       Precautions / Restrictions Precautions Precautions: Fall Precaution Comments: right homonymous hemianopsia  Restrictions Weight Bearing Restrictions: No      Mobility  Bed Mobility Overal bed mobility: Independent                Transfers Overall transfer level: Independent                  Ambulation/Gait Ambulation/Gait assistance: Modified independent (Device/Increase time)((increased time)) Gait Distance (Feet): 400 Feet Assistive device: None Gait Pattern/deviations: Step-through pattern;Narrow base of support;Decreased stride length Gait velocity: 1.74 ft/s Gait velocity interpretation: <1.8 ft/sec, indicate of risk for recurrent falls General Gait Details: patient with slow gait but no overt LOB with dynamic gait including head turns, pivots, increased speed, and walking  around/over obstacles. Noted decreased reciprocal arm swing.   Stairs            Wheelchair Mobility    Modified Rankin (Stroke Patients Only)       Balance Overall balance assessment: Needs assistance Sitting-balance support: No upper extremity supported;Feet supported Sitting balance-Leahy Scale: Normal     Standing balance support: No upper extremity supported;During functional activity Standing balance-Leahy Scale: Good                   Standardized Balance Assessment Standardized Balance Assessment : Dynamic Gait Index   Dynamic Gait Index Level Surface: Mild Impairment Change in Gait Speed: Mild Impairment Gait with Horizontal Head Turns: Mild Impairment Gait with Vertical Head Turns: Mild Impairment Gait and Pivot Turn: Normal Step Over Obstacle: Normal Step Around Obstacles: Normal Steps: Mild Impairment Total Score: 19       Pertinent Vitals/Pain Pain Assessment: No/denies pain    Home Living Family/patient expects to be discharged to:: Private residence Living Arrangements: Children Available Help at Discharge: Family Type of Home: House Home Access: Level entry     Home Layout: One level Home Equipment: Environmental consultant - 2 wheels;Shower seat;Toilet riser      Prior Function Level of Independence: Needs assistance   Gait / Transfers Assistance Needed: has supervision for community ambulation due to visual deficits  ADL's / Homemaking Assistance Needed: independent with ADL's, patient family provides assistance for IADL's i.e. cooking, cleaning        Hand Dominance   Dominant Hand: Right    Extremity/Trunk Assessment   Upper Extremity Assessment Upper Extremity Assessment: Defer to OT evaluation  Lower Extremity Assessment Lower Extremity Assessment: RLE deficits/detail;LLE deficits/detail RLE Deficits / Details: MMT: 5/5 throughout LLE Deficits / Details: MMT: 5/5 throughout    Cervical / Trunk Assessment Cervical / Trunk  Assessment: Normal  Communication   Communication: No difficulties  Cognition Arousal/Alertness: Awake/alert Behavior During Therapy: WFL for tasks assessed/performed Overall Cognitive Status: History of cognitive impairments - at baseline                                 General Comments: history of short term memory deficits from prior stroke      General Comments      Exercises     Assessment/Plan    PT Assessment Patent does not need any further PT services  PT Problem List         PT Treatment Interventions      PT Goals (Current goals can be found in the Care Plan section)  Acute Rehab PT Goals Patient Stated Goal: be independent for as long as possible PT Goal Formulation: All assessment and education complete, DC therapy    Frequency     Barriers to discharge        Co-evaluation               AM-PAC PT "6 Clicks" Daily Activity  Outcome Measure Difficulty turning over in bed (including adjusting bedclothes, sheets and blankets)?: None Difficulty moving from lying on back to sitting on the side of the bed? : None Difficulty sitting down on and standing up from a chair with arms (e.g., wheelchair, bedside commode, etc,.)?: None Help needed moving to and from a bed to chair (including a wheelchair)?: None Help needed walking in hospital room?: None Help needed climbing 3-5 steps with a railing? : A Little 6 Click Score: 23    End of Session   Activity Tolerance: Patient tolerated treatment well Patient left: in bed;with call bell/phone within reach;with family/visitor present Nurse Communication: Mobility status PT Visit Diagnosis: Unsteadiness on feet (R26.81);Difficulty in walking, not elsewhere classified (R26.2)    Time: 8676-1950 PT Time Calculation (min) (ACUTE ONLY): 16 min   Charges:   PT Evaluation $PT Eval Low Complexity: 1 Low     PT G Codes:        Ellamae Sia, PT, DPT Acute Rehabilitation Services  Pager:  [phone]   Willy Eddy 11/30/2017, 2:03 PM

## 2017-11-30 NOTE — Progress Notes (Signed)
OT Note - Addendum    11/30/17 1631  OT Visit Information  Last OT Received On 11/30/17  OT Time Calculation  OT Start Time (ACUTE ONLY) 1418  OT Stop Time (ACUTE ONLY) 1433  OT Time Calculation (min) 15 min  OT General Charges  $OT Visit 1 Visit  OT Evaluation  $OT Eval Low Complexity 1 Low  Maurie Boettcher, OT/L  OT Clinical Specialist [phone]

## 2017-11-30 NOTE — Evaluation (Signed)
Speech Language Pathology Evaluation Patient Details Name: Samantha Conrad MRN: [id number] DOB: Jun 16, 1940 Today's Date: 11/30/2017 Time: 7829-5621 SLP Time Calculation (min) (ACUTE ONLY): 27 min  Problem List:  Patient Active Problem List   Diagnosis Date Noted  . Symptomatic anemia 03/11/2017  . Diet-controlled diabetes mellitus ([place]) 03/11/2017  . NASH (nonalcoholic steatohepatitis) 03/11/2017  . Hypertension 03/11/2017  . Hypothyroidism 03/11/2017  . CKD (chronic kidney disease) stage V requiring chronic dialysis ([place]) 03/11/2017  . Seizure disorder 03/11/2017  . Abdominal pain 03/11/2017  . HLD (hyperlipidemia) 03/11/2017  . Chronic pancreatitis ([place]) 03/11/2017  . Constipation 03/11/2017  . Laryngopharyngeal reflux (LPR) 02/17/2015  . H/O Mild allergic rhinitis 02/17/2015  . Cirrhosis ([place]) 02/17/2015   Past Medical History:  Past Medical History:  Diagnosis Date  . Allergic rhinitis   . Basal cell carcinoma     on nose/face  . Constipation   . Depression   . Diarrhea due to drug   . Environmental and seasonal allergies   . ESRD on peritoneal dialysis ([place])    "q night" (11/29/2017)  . Fatty liver   . Headache    none since stroke in Dec. 2017 (11/29/2017)  . History of blood transfusion    "w/hysterectomy; w/kidney failure"  . Hypertension   . Hypothyroidism   . NASH (nonalcoholic steatohepatitis)   . Seizures ([place])    ? when she may have had a stroke sometime in the spring of 2018  . Sleep apnea    diagnosed years ago "after I'd gained alot of weight;  no longer a problem since weight loss"  . Stroke [facility]) 04/2016   memory issues, lost 1/2 vision in both eyes  . Thrombocytopenia ([place])   . Thyroid disease   . Type II diabetes mellitus ([place])    Past Surgical History:  Past Surgical History:  Procedure Laterality Date  . ABDOMINAL HYSTERECTOMY    . AV FISTULA PLACEMENT Right 10/30/2016   Procedure: INSERTION OF ARTERIOVENOUS (AV) GORE-TEX GRAFT RIGHT UPPER  ARM;  Surgeon: Elam Dutch, MD;  Location: [facility];  Service: Vascular;  Laterality: Right;  . CATARACT EXTRACTION W/ INTRAOCULAR LENS  IMPLANT, BILATERAL Bilateral   . CHOLECYSTECTOMY OPEN    . CORNEAL TRANSPLANT Bilateral   . EYE SURGERY    . KNEE ARTHROSCOPY Left   . MOHS SURGERY     "nose"  . SHOULDER OPEN ROTATOR CUFF REPAIR Left   . THROMBECTOMY AND REVISION OF ARTERIOVENTOUS (AV) GORETEX  GRAFT Right 12/04/2016   Procedure: THROMBECTOMY/ REVISION OF RIGHT UPPER ARM ARTERIOVENOUS GORETEX GRAFT;  Surgeon: Elam Dutch, MD;  Location: [facility];  Service: Vascular;  Laterality: Right;  . TONSILLECTOMY     HPI:  Pt is a 77 y.o. 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  Pt has had ongoing difficulty with short-term memory, problem-solving, sequencing, navigating in her home for which she has been seeing a [place], per her son.    Assessment / Plan / Recommendation Clinical Impression  Pt was assessed with the MOCA, scoring 22/26, with deficits specific to storage and retrieval of verbal information and impaired prospective memory.  Pt had intermittent difficulty with higher level tasks of alternating attention.  Her visuospatial Architect and language abilities were [facility].  Recommend resuming [place] SLP upon D/C home to facilitate functional recall in order to manage basic responsibilities at home.  Pt, family agree  with plan.      SLP Assessment  SLP Recommendation/Assessment: All further Speech Lanaguage Pathology  needs can be addressed in the next venue of care SLP Visit Diagnosis: Cognitive communication deficit (R41.841)    Follow Up Recommendations  Home health SLP    Frequency and Duration           SLP Evaluation Cognition  Overall Cognitive Status: Impaired/Different from baseline Arousal/Alertness: Awake/alert Orientation Level: Disoriented to time;Oriented to  place;Oriented to person Attention: Alternating Alternating Attention: Impaired Alternating Attention Impairment: Verbal basic Memory: Impaired Memory Impairment: Storage deficit;Retrieval deficit;Prospective memory Awareness: Appears intact Problem Solving: Impaired Problem Solving Impairment: Verbal basic Executive Function: Sequencing Sequencing: Impaired Sequencing Impairment: Verbal basic       Comprehension  Auditory Comprehension Overall Auditory Comprehension: Appears within functional limits for tasks assessed Visual Recognition/Discrimination Discrimination: Not tested Reading Comprehension Reading Status: Not tested    Expression Expression Primary Mode of Expression: Verbal Verbal Expression Overall Verbal Expression: Appears within functional limits for tasks assessed Written Expression Dominant Hand: Right   Oral / Motor  Oral Motor/Sensory Function Overall Oral Motor/Sensory Function: Within functional limits Motor Speech Overall Motor Speech: Appears within functional limits for tasks assessed   GO                    Juan Quam Laurice 11/30/2017, 3:11 PM  Ladean Steinmeyer L. Tivis Ringer, [place] CCC/SLP Pager [phone]

## 2017-12-01 LAB — HEPATITIS B SURFACE ANTIGEN: Hepatitis B Surface Ag: NEGATIVE

## 2017-12-01 LAB — HAPTOGLOBIN: Haptoglobin: 69 mg/dL (ref 34–200)

## 2017-12-11 ENCOUNTER — Emergency Department (HOSPITAL_COMMUNITY): Payer: Medicare Other

## 2017-12-11 ENCOUNTER — Encounter (HOSPITAL_COMMUNITY): Payer: Self-pay | Admitting: Emergency Medicine

## 2017-12-11 ENCOUNTER — Emergency Department (HOSPITAL_COMMUNITY)
Admission: EM | Admit: 2017-12-11 | Discharge: 2017-12-11 | Disposition: A | Discharge status: Home / Self Care | Payer: Medicare Other | Attending: Emergency Medicine | Admitting: Emergency Medicine

## 2017-12-11 DIAGNOSIS — Z7984 Long term (current) use of oral hypoglycemic drugs: Secondary | ICD-10-CM | POA: Diagnosis not present

## 2017-12-11 DIAGNOSIS — R55 Syncope and collapse: Secondary | ICD-10-CM

## 2017-12-11 DIAGNOSIS — E039 Hypothyroidism, unspecified: Secondary | ICD-10-CM | POA: Diagnosis not present

## 2017-12-11 DIAGNOSIS — E1122 Type 2 diabetes mellitus with diabetic chronic kidney disease: Secondary | ICD-10-CM | POA: Diagnosis not present

## 2017-12-11 DIAGNOSIS — N186 End stage renal disease: Secondary | ICD-10-CM | POA: Insufficient documentation

## 2017-12-11 DIAGNOSIS — Z79899 Other long term (current) drug therapy: Secondary | ICD-10-CM | POA: Insufficient documentation

## 2017-12-11 DIAGNOSIS — I120 Hypertensive chronic kidney disease with stage 5 chronic kidney disease or end stage renal disease: Secondary | ICD-10-CM | POA: Insufficient documentation

## 2017-12-11 DIAGNOSIS — Z992 Dependence on renal dialysis: Secondary | ICD-10-CM | POA: Diagnosis not present

## 2017-12-11 LAB — URINALYSIS, ROUTINE W REFLEX MICROSCOPIC
Bilirubin Urine: NEGATIVE
Glucose, UA: 50 mg/dL — AB
Ketones, ur: NEGATIVE mg/dL
Nitrite: NEGATIVE
Protein, ur: 30 mg/dL — AB
Specific Gravity, Urine: 1.012 (ref 1.005–1.030)
pH: 5 (ref 5.0–8.0)

## 2017-12-11 LAB — COMPREHENSIVE METABOLIC PANEL
ALT: 18 U/L (ref 0–44)
AST: 21 U/L (ref 15–41)
Albumin: 3.1 g/dL — ABNORMAL LOW (ref 3.5–5.0)
Alkaline Phosphatase: 84 U/L (ref 38–126)
Anion gap: 11 (ref 5–15)
BUN: 72 mg/dL — ABNORMAL HIGH (ref 8–23)
CO2: 25 mmol/L (ref 22–32)
Calcium: 8.8 mg/dL — ABNORMAL LOW (ref 8.9–10.3)
Chloride: 102 mmol/L (ref 98–111)
Creatinine, Ser: 5.25 mg/dL — ABNORMAL HIGH (ref 0.44–1.00)
GFR calc Af Amer: 8 mL/min — ABNORMAL LOW (ref >60)
GFR calc non Af Amer: 7 mL/min — ABNORMAL LOW (ref >60)
Glucose, Bld: 98 mg/dL (ref 70–99)
Potassium: 3.9 mmol/L (ref 3.5–5.1)
Sodium: 138 mmol/L (ref 135–145)
Total Bilirubin: 0.5 mg/dL (ref 0.3–1.2)
Total Protein: 5.7 g/dL — ABNORMAL LOW (ref 6.5–8.1)

## 2017-12-11 LAB — CBC WITH DIFFERENTIAL/PLATELET
Abs Immature Granulocytes: 0.2 10*3/uL — ABNORMAL HIGH (ref 0.0–0.1)
Basophils Absolute: 0 10*3/uL (ref 0.0–0.1)
Basophils Relative: 0 %
Eosinophils Absolute: 0.1 10*3/uL (ref 0.0–0.7)
Eosinophils Relative: 1 %
HCT: 30 % — ABNORMAL LOW (ref 36.0–46.0)
Hemoglobin: 9.7 g/dL — ABNORMAL LOW (ref 12.0–15.0)
Immature Granulocytes: 2 %
Lymphocytes Relative: 11 %
Lymphs Abs: 1.1 10*3/uL (ref 0.7–4.0)
MCH: 31.7 pg (ref 26.0–34.0)
MCHC: 32.3 g/dL (ref 30.0–36.0)
MCV: 98 fL (ref 78.0–100.0)
Monocytes Absolute: 0.9 10*3/uL (ref 0.1–1.0)
Monocytes Relative: 9 %
Neutro Abs: 8.1 10*3/uL — ABNORMAL HIGH (ref 1.7–7.7)
Neutrophils Relative %: 77 %
Platelets: 72 10*3/uL — ABNORMAL LOW (ref 150–400)
RBC: 3.06 MIL/uL — ABNORMAL LOW (ref 3.87–5.11)
RDW: 15.4 % (ref 11.5–15.5)
WBC: 10.4 10*3/uL (ref 4.0–10.5)

## 2017-12-11 LAB — I-STAT TROPONIN, ED: Troponin i, poc: 0 ng/mL (ref 0.00–0.08)

## 2017-12-11 LAB — TYPE AND SCREEN
ABO/RH(D): A POS
Antibody Screen: NEGATIVE

## 2017-12-11 LAB — I-STAT CG4 LACTIC ACID, ED: Lactic Acid, Venous: 0.94 mmol/L (ref 0.5–1.9)

## 2017-12-11 MED ORDER — SODIUM CHLORIDE 0.9 % IV BOLUS
500.0000 mL | Freq: Once | INTRAVENOUS | Status: AC
  Administered 2017-12-11: 500 mL via INTRAVENOUS

## 2017-12-11 NOTE — ED Notes (Signed)
Gave pt [place] sandwich, diet sprite and crackers

## 2017-12-11 NOTE — ED Provider Notes (Signed)
[facility] EMERGENCY DEPARTMENT Provider Note   CSN: [id number] Arrival date & time: 12/11/17  1112     History   Chief Complaint Chief Complaint  Patient presents with  . Shortness of Breath  . Weakness    HPI Samantha Conrad is a 77 y.o. female.  HPI Patient presents to the emergency department with lightheadedness and feeling like she might pass out.  Patient states that last night after her peritoneal dialysis she did get a lot of cramping.  She states that when she woke up this morning she felt somewhat lightheaded and that when she went to get her labs drawn at her dialysis center she felt she might pass out so they sent her here.  Patient states that she does feel some better but just weak.  The patient denies chest pain, shortness of breath, headache,blurred vision, neck pain, fever, cough,  numbness, dizziness, anorexia, edema, abdominal pain, nausea, vomiting, diarrhea, rash, back pain, dysuria, hematemesis, bloody stool,or syncope. Past Medical History:  Diagnosis Date  . Allergic rhinitis   . Basal cell carcinoma     on nose/face  . Constipation   . Depression   . Diarrhea due to drug   . Environmental and seasonal allergies   . ESRD on peritoneal dialysis ([place])    "q night" (11/29/2017)  . Fatty liver   . Headache    none since stroke in Dec. 2017 (11/29/2017)  . History of blood transfusion    "w/hysterectomy; w/kidney failure"  . Hypertension   . Hypothyroidism   . NASH (nonalcoholic steatohepatitis)   . Seizures ([place])    ? when she may have had a stroke sometime in the spring of 2018  . Sleep apnea    diagnosed years ago "after I'd gained alot of weight;  no longer a problem since weight loss"  . Stroke [facility]) 04/2016   memory issues, lost 1/2 vision in both eyes  . Thrombocytopenia ([place])   . Thyroid disease   . Type II diabetes mellitus [facility])     Patient Active Problem List   Diagnosis Date Noted  . Symptomatic anemia 03/11/2017    . Diet-controlled diabetes mellitus ([place]) 03/11/2017  . NASH (nonalcoholic steatohepatitis) 03/11/2017  . Hypertension 03/11/2017  . Hypothyroidism 03/11/2017  . CKD (chronic kidney disease) stage V requiring chronic dialysis ([place]) 03/11/2017  . Seizure disorder 03/11/2017  . Abdominal pain 03/11/2017  . HLD (hyperlipidemia) 03/11/2017  . Chronic pancreatitis ([place]) 03/11/2017  . Constipation 03/11/2017  . Laryngopharyngeal reflux (LPR) 02/17/2015  . H/O Mild allergic rhinitis 02/17/2015  . Cirrhosis ([place]) 02/17/2015    Past Surgical History:  Procedure Laterality Date  . ABDOMINAL HYSTERECTOMY    . AV FISTULA PLACEMENT Right 10/30/2016   Procedure: INSERTION OF ARTERIOVENOUS (AV) GORE-TEX GRAFT RIGHT UPPER ARM;  Surgeon: Elam Dutch, MD;  Location: [facility];  Service: Vascular;  Laterality: Right;  . CATARACT EXTRACTION W/ INTRAOCULAR LENS  IMPLANT, BILATERAL Bilateral   . CHOLECYSTECTOMY OPEN    . CORNEAL TRANSPLANT Bilateral   . EYE SURGERY    . KNEE ARTHROSCOPY Left   . MOHS SURGERY     "nose"  . SHOULDER OPEN ROTATOR CUFF REPAIR Left   . THROMBECTOMY AND REVISION OF ARTERIOVENTOUS (AV) GORETEX  GRAFT Right 12/04/2016   Procedure: THROMBECTOMY/ REVISION OF RIGHT UPPER ARM ARTERIOVENOUS GORETEX GRAFT;  Surgeon: Elam Dutch, MD;  Location: [facility];  Service: Vascular;  Laterality: Right;  . TONSILLECTOMY  OB History   None      Home Medications    Prior to Admission medications   Medication Sig Start Date End Date Taking? Authorizing Provider  acetaminophen-codeine (TYLENOL #3) 300-30 MG tablet Take 1-2 tablets by mouth every 6 (six) hours as needed for moderate pain or severe pain. 12/04/16  Yes Virgina Jock A, PA-C  Amino Acids-Protein Hydrolys (FEEDING SUPPLEMENT, PRO-STAT SUGAR FREE 64,) LIQD Take 30 mLs by mouth 2 (two) times daily. 03/16/17  Yes Patrecia Pour, Christean Grief, MD  bisacodyl (DULCOLAX) 10 MG suppository Place 10 mg rectally daily as needed (for  constipation.).    Yes [provider]  calcitRIOL (ROCALTROL) 0.25 MCG capsule Take 1 capsule (0.25 mcg total) by mouth daily. 12/01/17  Yes Rittberger, Bernita Raisin, DO  calcium acetate (PHOSLO) 667 MG capsule Take 2 capsules (1,334 mg total) by mouth 3 (three) times daily with meals. 11/30/17  Yes Rittberger, Bernita Raisin, DO  citalopram (CELEXA) 10 MG tablet Take 10 mg by mouth daily. 10/15/17  Yes [provider]  furosemide (LASIX) 40 MG tablet Take 40 mg by mouth 2 (two) times daily.   Yes [provider]  glipiZIDE (GLUCOTROL) 10 MG tablet Take 10 mg by mouth daily. 10/16/17  Yes [provider]  levETIRAcetam (KEPPRA) 250 MG tablet Take 250 mg by mouth every 12 (twelve) hours. With breakfast & with supper.   Yes [provider]  levothyroxine (SYNTHROID, LEVOTHROID) 100 MCG tablet Take 100 mcg by mouth daily before breakfast.  09/02/16  Yes [provider]  linagliptin (TRADJENTA) 5 MG TABS tablet Take 5 mg by mouth daily.   Yes [provider]  losartan (COZAAR) 25 MG tablet Take 25 mg by mouth daily.   Yes [provider]  multivitamin (PROSIGHT) TABS tablet Take 1 tablet by mouth daily. 03/17/17  Yes Patrecia Pour, Christean Grief, MD  nitroGLYCERIN (NITROSTAT) 0.4 MG SL tablet Place 0.4 mg under the tongue every 5 (five) minutes as needed for chest pain.   Yes [provider]  Nutritional Supplements (FEEDING SUPPLEMENT, NEPRO CARB STEADY,) LIQD Take 237 mLs by mouth daily. 03/17/17  Yes Patrecia Pour, Christean Grief, MD  Pancrelipase, Lip-Prot-Amyl, (CREON) 24000-76000 units CPEP Take 4 capsules by mouth 3 (three) times daily with meals.    Yes [provider]  polyethylene glycol (MIRALAX / GLYCOLAX) packet Take 17 g by mouth daily as needed for mild constipation (for constipation).    Yes [provider]  potassium chloride SA (K-DUR,KLOR-CON) 20 MEQ tablet Take 1 tablet (20 mEq total) by mouth 2 (two) times daily. 11/30/17   Yes Rittberger, Bernita Raisin, DO  prednisoLONE acetate (PRED FORTE) 1 % ophthalmic suspension Place 1 drop into both eyes at bedtime.    Yes [provider]  traZODone (DESYREL) 50 MG tablet Take 50 mg by mouth at bedtime.   Yes [provider]    Family History History reviewed. No pertinent family history.  Social History Social History   Tobacco Use  . Smoking status: Never Smoker  . Smokeless tobacco: Former Systems developer    Types: Snuff  . Tobacco comment: "no snuff since < 1980s"  Substance Use Topics  . Alcohol use: Never    Frequency: Never  . Drug use: Never     Allergies   Contrast media [iodinated diagnostic agents]   Review of Systems Review of Systems All other systems negative except as documented in the HPI. All pertinent positives and negatives as reviewed in the HPI.  Physical Exam Updated Vital Signs BP (!) 105/42   Pulse 74   Temp 98.3 F (36.8 C) (Oral)   Resp 15   SpO2 97%   Physical Exam  Constitutional: She is oriented to person, place, and time. She appears well-developed and well-nourished. No distress.  HENT:  Head: Normocephalic and atraumatic.  Mouth/Throat: Oropharynx is clear and moist.  Eyes: Pupils are equal, round, and reactive to light.  Neck: Normal range of motion. Neck supple.  Cardiovascular: Normal rate, regular rhythm and normal heart sounds. Exam reveals no gallop and no friction rub.  No murmur heard. Pulmonary/Chest: Effort normal and breath sounds normal. No respiratory distress. She has no wheezes.  Abdominal: Soft. Bowel sounds are normal. She exhibits no distension. There is no tenderness.  Musculoskeletal:       Right lower leg: She exhibits no edema.       Left lower leg: She exhibits no edema.  Neurological: She is alert and oriented to person, place, and time. She exhibits normal muscle tone. Coordination normal.  Skin: Skin is warm and dry. Capillary refill takes less than 2 seconds. No rash noted. No  erythema.  Psychiatric: She has a normal mood and affect. Her behavior is normal.  Nursing note and vitals reviewed.    ED Treatments / Results  Labs (all labs ordered are listed, but only abnormal results are displayed) Labs Reviewed  COMPREHENSIVE METABOLIC PANEL - Abnormal; Notable for the following components:      Result Value   BUN 72 (*)    Creatinine, Ser 5.25 (*)    Calcium 8.8 (*)    Total Protein 5.7 (*)    Albumin 3.1 (*)    GFR calc non Af Amer 7 (*)    GFR calc Af Amer 8 (*)    All other components within normal limits  CBC WITH DIFFERENTIAL/PLATELET - Abnormal; Notable for the following components:   RBC 3.06 (*)    Hemoglobin 9.7 (*)    HCT 30.0 (*)    Platelets 72 (*)    Neutro Abs 8.1 (*)    Abs Immature Granulocytes 0.2 (*)    All other components within normal limits  URINE CULTURE  URINALYSIS, ROUTINE W REFLEX MICROSCOPIC  I-STAT CG4 LACTIC ACID, ED  I-STAT TROPONIN, ED  TYPE AND SCREEN    EKG EKG Interpretation  Date/Time:  Tuesday December 11 2017 11:22:31 EDT Ventricular Rate:  75 PR Interval:  212 QRS Duration: 82 QT Interval:  396 QTC Calculation: 442 R Axis:   44 Text Interpretation:  Sinus rhythm with 1st degree A-V block Low voltage QRS Borderline ECG No significant change since last tracing Confirmed by Wandra Arthurs [id number]) on 12/11/2017 1:03:42 PM   Radiology Dg Chest 2 View  Result Date: 12/11/2017 CLINICAL DATA:  Hypotension.  Chronic renal failure EXAM: CHEST - 2 VIEW COMPARISON:  May 04, 2017 FINDINGS: Lungs are clear. Heart size and pulmonary vascularity are normal. No adenopathy. There is postoperative change in the left shoulder. IMPRESSION: No edema or consolidation. Electronically Signed   By: Lowella Grip III M.D.   On: 12/11/2017 14:18    Procedures Procedures (including critical care time)  Medications Ordered in ED Medications  sodium chloride 0.9 % bolus 500 mL (500 mLs Intravenous New Bag/Given 12/11/17 1450)       Initial Impression / Assessment and Plan / ED Course  I have reviewed the triage vital signs and the nursing notes.  Pertinent labs & imaging results  that were available during my care of the patient were reviewed by me and considered in my medical decision making (see chart for details).     Patient has no neurological deficits noted on exam I did give her 500 cc of fluid due to the fact she did have a lot of cramping after her dialysis last night.  I feel that this could be a factor in the fact that she feels near syncopal and weak.  Patient is feeling better after the fluids will ambulate the patient and see how she performs and monitor her vitals during this.  Patient will most likely be discharged home.  Patient is advised she will need to follow-up with her doctor and her dialysis center. Final Clinical Impressions(s) / ED Diagnoses   Final diagnoses:  None    ED Discharge Orders    None       Dalia Heading, PA-C 12/12/17 1603    Drenda Freeze, MD 12/13/17 [phone]

## 2017-12-11 NOTE — Discharge Instructions (Signed)
Please follow up with your doctors and return if worsening

## 2017-12-11 NOTE — ED Notes (Signed)
Pt ambulated with assistance, felt dizzy at times and was off balance.

## 2017-12-11 NOTE — ED Notes (Signed)
Pt aware of need for urine, states she rarely makes urine.

## 2017-12-11 NOTE — ED Triage Notes (Signed)
Pt is a retroperitoneal dialysis pt. Pt had 3x more fluid removed last night than normal. Pt recently had low hgb and was seen here- received 2 units. Pt had follow up appointment today at dialysis center and had blood work. Pt was at dialysis center on July 11th, hgb 9.0. Dialysis center gave her 246mg of Mircera today. Pt felt SOB and generalized weakness today. BP 101/50 while sitting, BP 98/54 standing. Pt feels like she will pass out when she stands. 100% on 2L [place]. Does not wear O2 at home.Pt is alert and oriented.

## 2017-12-11 NOTE — ED Provider Notes (Signed)
77 year old female presents with near syncope. She is on peritoneal dialysis and pulled off more fluid than usual. She was scheduled to have blood work done today at the dialysis center and due to her symptoms she was sent to the ED for an evaluation. Previous team felt that the patient can go home since her work up was overall reassuring but wanted to make sure she can ambulate since she is independent at baseline. Nursing staff ambulated the patient and she was off balance but able to ambulate. Discussed all results with the patient and her son. They are comfortable with d/c and outpatient follow up. Return precautions were given.   Recardo Evangelist, PA-C 12/11/17 1844    Dorie Rank, MD 12/12/17 [phone]

## 2017-12-12 LAB — URINE CULTURE: Culture: <10000 — AB

## 2017-12-27 ENCOUNTER — Other Ambulatory Visit: Payer: Self-pay | Admitting: Family Medicine

## 2018-01-23 ENCOUNTER — Other Ambulatory Visit: Payer: Self-pay | Admitting: Family Medicine

## 2018-01-27 ENCOUNTER — Other Ambulatory Visit: Payer: Self-pay | Admitting: Family Medicine

## 2018-02-21 ENCOUNTER — Other Ambulatory Visit: Payer: Self-pay | Admitting: Family Medicine

## 2018-06-10 ENCOUNTER — Telehealth: Payer: Self-pay | Admitting: *Deleted

## 2018-06-10 NOTE — Telephone Encounter (Signed)
Per Dr. Hollie Salk at [facility] [phone]), this peritoneal dialysis patient has had redness and swelling at right upper extremity AVG site x 1 day.This AVG was placed by Dr. Oneida Alar in 2018 and subsequently had T&R on 12-04-16. Patient is given an appt to see Dr. Carlis Abbott tomorrow for evaluation. Dr. Hollie Salk is in agreement with this plan and will instruct the patient on this appt.

## 2018-06-11 ENCOUNTER — Encounter: Payer: Self-pay | Admitting: Vascular Surgery

## 2018-06-11 ENCOUNTER — Other Ambulatory Visit: Payer: Self-pay

## 2018-06-11 ENCOUNTER — Ambulatory Visit (INDEPENDENT_AMBULATORY_CARE_PROVIDER_SITE_OTHER): Payer: Medicare Other | Admitting: Vascular Surgery

## 2018-06-11 VITALS — BP 139/71 | HR 68 | Temp 97.8°F | Resp 14 | Ht 61.0 in | Wt 121.0 lb

## 2018-06-11 DIAGNOSIS — N186 End stage renal disease: Secondary | ICD-10-CM

## 2018-06-11 DIAGNOSIS — Z992 Dependence on renal dialysis: Secondary | ICD-10-CM | POA: Diagnosis not present

## 2018-06-11 MED ORDER — CEPHALEXIN 500 MG PO CAPS: 500.0000 mg | ORAL_CAPSULE | Freq: Two times a day (BID) | ORAL | 0 refills | Status: DC

## 2018-06-11 NOTE — Progress Notes (Signed)
Patient name: Samantha Conrad MRN: [id number] DOB: 05-15-1941 Sex: female  REASON FOR CONSULT: Concern for right upper arm graft infection  HPI: Samantha Conrad is a 78 y.o. female, with history of end-stage renal disease on dialysis via PD catheter that presents for evaluation of right upper arm graft with concern for possible infection.  She was referred by her dialysis center and her nephrologist.  Patient states that she noticed that her right upper arm graft stopped having a thrill  yesterday morning.  Throughout the day her arm became more tender and then she noticed that there was some redness over the graft site.  She denies any fevers or chills or other signs of infection.  She is not using the graft since she is using her PD catheter.  She states sometimes they draw labs from the graft but that is it and it has been over a week since it was accessed.  Past Medical History:  Diagnosis Date  . Allergic rhinitis   . Basal cell carcinoma     on nose/face  . Constipation   . Depression   . Diarrhea due to drug   . Environmental and seasonal allergies   . ESRD on peritoneal dialysis ([place])    "q night" (11/29/2017)  . Fatty liver   . Headache    none since stroke in Dec. 2017 (11/29/2017)  . History of blood transfusion    "w/hysterectomy; w/kidney failure"  . Hypertension   . Hypothyroidism   . NASH (nonalcoholic steatohepatitis)   . Seizures ([place])    ? when she may have had a stroke sometime in the spring of 2018  . Sleep apnea    diagnosed years ago "after I'd gained alot of weight;  no longer a problem since weight loss"  . Stroke [facility]) 04/2016   memory issues, lost 1/2 vision in both eyes  . Thrombocytopenia ([place])   . Thyroid disease   . Type II diabetes mellitus ([place])     Past Surgical History:  Procedure Laterality Date  . ABDOMINAL HYSTERECTOMY    . AV FISTULA PLACEMENT Right 10/30/2016   Procedure: INSERTION OF ARTERIOVENOUS (AV) GORE-TEX GRAFT RIGHT UPPER ARM;   Surgeon: Elam Dutch, MD;  Location: [place];  Service: Vascular;  Laterality: Right;  . CATARACT EXTRACTION W/ INTRAOCULAR LENS  IMPLANT, BILATERAL Bilateral   . CHOLECYSTECTOMY OPEN    . CORNEAL TRANSPLANT Bilateral   . EYE SURGERY    . KNEE ARTHROSCOPY Left   . MOHS SURGERY     "nose"  . SHOULDER OPEN ROTATOR CUFF REPAIR Left   . THROMBECTOMY AND REVISION OF ARTERIOVENTOUS (AV) GORETEX  GRAFT Right 12/04/2016   Procedure: THROMBECTOMY/ REVISION OF RIGHT UPPER ARM ARTERIOVENOUS GORETEX GRAFT;  Surgeon: Elam Dutch, MD;  Location: [place];  Service: Vascular;  Laterality: Right;  . TONSILLECTOMY      History reviewed. No pertinent family history.  SOCIAL HISTORY: Social History   Socioeconomic History  . Marital status: Married    Spouse name: Not on file  . Number of children: Not on file  . Years of education: Not on file  . Highest education level: Not on file  Occupational History  . Not on file  Social Needs  . Financial resource strain: Not on file  . Food insecurity:    Worry: Not on file    Inability: Not on file  . Transportation needs:    Medical: Not on file  Non-medical: Not on file  Tobacco Use  . Smoking status: Never Smoker  . Smokeless tobacco: Former Systems developer    Types: Snuff  . Tobacco comment: "no snuff since < 1980s"  Substance and Sexual Activity  . Alcohol use: Never    Frequency: Never  . Drug use: Never  . Sexual activity: Not Currently  Lifestyle  . Physical activity:    Days per week: Not on file    Minutes per session: Not on file  . Stress: Not on file  Relationships  . Social connections:    Talks on phone: Not on file    Gets together: Not on file    Attends religious service: Not on file    Active member of club or organization: Not on file    Attends meetings of clubs or organizations: Not on file    Relationship status: Not on file  . Intimate partner violence:    Fear of current or ex partner: Not on file    Emotionally  abused: Not on file    Physically abused: Not on file    Forced sexual activity: Not on file  Other Topics Concern  . Not on file  Social History Narrative  . Not on file    Allergies  Allergen Reactions  . Contrast Media [Iodinated Diagnostic Agents] Swelling    SWELLING REACTION UNSPECIFIED     Current Outpatient Medications  Medication Sig Dispense Refill  . acetaminophen-codeine (TYLENOL #3) 300-30 MG tablet Take 1-2 tablets by mouth every 6 (six) hours as needed for moderate pain.    . Amino Acids-Protein Hydrolys (FEEDING SUPPLEMENT, PRO-STAT SUGAR FREE 64,) LIQD Take 30 mLs by mouth 2 (two) times daily. 900 mL 0  . bisacodyl (DULCOLAX) 10 MG suppository Place 10 mg rectally daily as needed (for constipation.).     Marland Kitchen citalopram (CELEXA) 10 MG tablet Take 10 mg by mouth daily.  1  . furosemide (LASIX) 40 MG tablet Take 40 mg by mouth 2 (two) times daily.    Marland Kitchen glipiZIDE (GLUCOTROL) 10 MG tablet Take 5 mg by mouth daily.   1  . levETIRAcetam (KEPPRA) 250 MG tablet Take 250 mg by mouth every 12 (twelve) hours. With breakfast & with supper.    . levothyroxine (SYNTHROID, LEVOTHROID) 100 MCG tablet Take 100 mcg by mouth daily before breakfast.   0  . linagliptin (TRADJENTA) 5 MG TABS tablet Take 5 mg by mouth daily.    Marland Kitchen losartan (COZAAR) 25 MG tablet Take 25 mg by mouth daily.    . multivitamin (PROSIGHT) TABS tablet Take 1 tablet by mouth daily. 30 each 0  . nitroGLYCERIN (NITROSTAT) 0.4 MG SL tablet Place 0.4 mg under the tongue every 5 (five) minutes as needed for chest pain.    . Nutritional Supplements (FEEDING SUPPLEMENT, NEPRO CARB STEADY,) LIQD Take 237 mLs by mouth daily.  0  . Pancrelipase, Lip-Prot-Amyl, (CREON) 24000-76000 units CPEP Take 4 capsules by mouth 3 (three) times daily with meals.     . polyethylene glycol (MIRALAX / GLYCOLAX) packet Take 17 g by mouth daily as needed for mild constipation (for constipation).     . potassium chloride SA (K-DUR,KLOR-CON) 20 MEQ  tablet Take 1 tablet (20 mEq total) by mouth 2 (two) times daily. 60 tablet 0  . prednisoLONE acetate (PRED FORTE) 1 % ophthalmic suspension Place 1 drop into both eyes at bedtime.     . traZODone (DESYREL) 50 MG tablet Take 50 mg by mouth at bedtime.    Marland Kitchen  calcitRIOL (ROCALTROL) 0.25 MCG capsule Take 1 capsule (0.25 mcg total) by mouth daily. (Patient not taking: Reported on 06/11/2018) 30 capsule 0  . calcium acetate (PHOSLO) 667 MG capsule Take 2 capsules (1,334 mg total) by mouth 3 (three) times daily with meals. (Patient not taking: Reported on 06/11/2018) 180 capsule 0  . cephALEXin (KEFLEX) 500 MG capsule Take 1 capsule (500 mg total) by mouth 2 (two) times daily. 14 capsule 0   No current facility-administered medications for this visit.     REVIEW OF SYSTEMS:  [X]  denotes positive finding, [ ]  denotes negative finding Cardiac  Comments:  Chest pain or chest pressure:    Shortness of breath upon exertion:    Short of breath when lying flat:    Irregular heart rhythm:        Vascular    Pain in calf, thigh, or hip brought on by ambulation:    Pain in feet at night that wakes you up from your sleep:     Blood clot in your veins:    Leg swelling:         Pulmonary    Oxygen at home:    Productive cough:     Wheezing:         Neurologic    Sudden weakness in arms or legs:     Sudden numbness in arms or legs:     Sudden onset of difficulty speaking or slurred speech:    Temporary loss of vision in one eye:     Problems with dizziness:         Gastrointestinal    Blood in stool:     Vomited blood:         Genitourinary    Burning when urinating:     Blood in urine:        Psychiatric    Major depression:         Hematologic    Bleeding problems:    Problems with blood clotting too easily:        Skin    Rashes or ulcers: x Redness over right upper arm graft      Constitutional    Fever or chills:      PHYSICAL EXAM: Vitals:   06/11/18 1257  BP: 139/71    Pulse: 68  Resp: 14  Temp: 97.8 F (36.6 C)  TempSrc: Oral  SpO2: 98%  Weight: 121 lb (54.9 kg)  Height: 5' 1"  (1.549 m)    GENERAL: The patient is a well-nourished female, in no acute distress. The vital signs are documented above. CARDIAC: There is a regular rate and rhythm.  VASCULAR:  Right upper arm graft, no thrill, redness that extends perfectly along the midline of the graft - no redness outside borders of graft PULMONARY: There is good air exchange bilaterally without wheezing or rales. ABDOMEN: PD dialysis catheter. MUSCULOSKELETAL: There are no major deformities or cyanosis. NEUROLOGIC: No focal weakness or paresthesias are detected.  DATA:   None  Assessment/Plan:  Patient has some redness and tenderness over the graft site that started after the graft thrombosed yesterday morning.  She has no systemic signs of illness.  There is no fluctuance around the graft.  Discussed with her our underlying concern that this could be infected which would require graft excision.  On the other hand she may have a superficial inflammation related to the graft thrombosis and this is not infectious in nature at all.  As a result, I have  prescribed her one week of Keflex antibiotics and will plan to see her back next week and discussed that if her symptoms get worse she needs to let our office know or go to the ED.  Otherwise this may all resolve if it is just a reaction from the graft thrombosing itself yesterday morning.  Discussed that if things get worse ultimately we will have to excise her graft.  I do not see an immediate role for thrombectomy of the graft and salvage given that she is not using it and the initial concern has been possible infection.   Marty Heck, MD [facility] Office: [phone] Pager: [place]

## 2018-06-18 ENCOUNTER — Ambulatory Visit: Payer: Medicare Other | Admitting: Vascular Surgery

## 2018-06-18 ENCOUNTER — Other Ambulatory Visit: Payer: Self-pay

## 2018-06-18 ENCOUNTER — Encounter: Payer: Self-pay | Admitting: Vascular Surgery

## 2018-06-18 VITALS — BP 141/68 | HR 65 | Temp 97.1°F | Resp 20 | Ht 61.0 in | Wt 121.0 lb

## 2018-06-18 DIAGNOSIS — N186 End stage renal disease: Secondary | ICD-10-CM

## 2018-06-18 DIAGNOSIS — Z992 Dependence on renal dialysis: Secondary | ICD-10-CM

## 2018-06-18 NOTE — Progress Notes (Signed)
Patient name: Samantha Conrad MRN: [id number] DOB: 04-10-1941 Sex: female  REASON FOR CONSULT: Concern for right upper arm graft infection - follow-up  HPI: Samantha Conrad is a 78 y.o. female, with history of end-stage renal disease on dialysis via PD catheter that presents for one week follow-up in setting of concern for right upper arm graft infection.  She was seen last week as a referral from her nephrologist for redness along her right upper arm AV graft.  Ultimately we elected observation with 1 week of Keflex antibiotics.  The concern was that given no fluctuance or other concerning findings we thought maybe this was a reactive phlebitis from her graft thrombosing.  She noted her graft thrombosed last week in the morning and then developed the discoloration along the graft in the afternoon with pain. Follow-up today she reports all the redness along the graft is resolved.  She has had no fevers.  All of her pain is resolved.  Using her PD catheter for dialysis.  Past Medical History:  Diagnosis Date  . Allergic rhinitis   . Basal cell carcinoma     on nose/face  . Constipation   . Depression   . Diarrhea due to drug   . Environmental and seasonal allergies   . ESRD on peritoneal dialysis ([place])    "q night" (11/29/2017)  . Fatty liver   . Headache    none since stroke in Dec. 2017 (11/29/2017)  . History of blood transfusion    "w/hysterectomy; w/kidney failure"  . Hypertension   . Hypothyroidism   . NASH (nonalcoholic steatohepatitis)   . Seizures ([place])    ? when she may have had a stroke sometime in the spring of 2018  . Sleep apnea    diagnosed years ago "after I'd gained alot of weight;  no longer a problem since weight loss"  . Stroke [facility]) 04/2016   memory issues, lost 1/2 vision in both eyes  . Thrombocytopenia ([place])   . Thyroid disease   . Type II diabetes mellitus ([place])     Past Surgical History:  Procedure Laterality Date  . ABDOMINAL HYSTERECTOMY    . AV  FISTULA PLACEMENT Right 10/30/2016   Procedure: INSERTION OF ARTERIOVENOUS (AV) GORE-TEX GRAFT RIGHT UPPER ARM;  Surgeon: Elam Dutch, MD;  Location: [place];  Service: Vascular;  Laterality: Right;  . CATARACT EXTRACTION W/ INTRAOCULAR LENS  IMPLANT, BILATERAL Bilateral   . CHOLECYSTECTOMY OPEN    . CORNEAL TRANSPLANT Bilateral   . EYE SURGERY    . KNEE ARTHROSCOPY Left   . MOHS SURGERY     "nose"  . SHOULDER OPEN ROTATOR CUFF REPAIR Left   . THROMBECTOMY AND REVISION OF ARTERIOVENTOUS (AV) GORETEX  GRAFT Right 12/04/2016   Procedure: THROMBECTOMY/ REVISION OF RIGHT UPPER ARM ARTERIOVENOUS GORETEX GRAFT;  Surgeon: Elam Dutch, MD;  Location: [place];  Service: Vascular;  Laterality: Right;  . TONSILLECTOMY      History reviewed. No pertinent family history.  SOCIAL HISTORY: Social History   Socioeconomic History  . Marital status: Married    Spouse name: Not on file  . Number of children: Not on file  . Years of education: Not on file  . Highest education level: Not on file  Occupational History  . Not on file  Social Needs  . Financial resource strain: Not on file  . Food insecurity:    Worry: Not on file    Inability: Not on file  .  Transportation needs:    Medical: Not on file    Non-medical: Not on file  Tobacco Use  . Smoking status: Never Smoker  . Smokeless tobacco: Former Systems developer    Types: Snuff  . Tobacco comment: "no snuff since < 1980s"  Substance and Sexual Activity  . Alcohol use: Never    Frequency: Never  . Drug use: Never  . Sexual activity: Not Currently  Lifestyle  . Physical activity:    Days per week: Not on file    Minutes per session: Not on file  . Stress: Not on file  Relationships  . Social connections:    Talks on phone: Not on file    Gets together: Not on file    Attends religious service: Not on file    Active member of club or organization: Not on file    Attends meetings of clubs or organizations: Not on file    Relationship  status: Not on file  . Intimate partner violence:    Fear of current or ex partner: Not on file    Emotionally abused: Not on file    Physically abused: Not on file    Forced sexual activity: Not on file  Other Topics Concern  . Not on file  Social History Narrative  . Not on file    Allergies  Allergen Reactions  . Contrast Media [Iodinated Diagnostic Agents] Swelling    SWELLING REACTION UNSPECIFIED   . Tape Other (See Comments)    Ask    Current Outpatient Medications  Medication Sig Dispense Refill  . acetaminophen-codeine (TYLENOL #3) 300-30 MG tablet Take 1-2 tablets by mouth every 6 (six) hours as needed for moderate pain.    . Amino Acids-Protein Hydrolys (FEEDING SUPPLEMENT, PRO-STAT SUGAR FREE 64,) LIQD Take 30 mLs by mouth 2 (two) times daily. 900 mL 0  . bisacodyl (DULCOLAX) 10 MG suppository Place 10 mg rectally daily as needed (for constipation.).     Marland Kitchen citalopram (CELEXA) 10 MG tablet Take 10 mg by mouth daily.  1  . furosemide (LASIX) 40 MG tablet Take 40 mg by mouth 2 (two) times daily.    Marland Kitchen glipiZIDE (GLUCOTROL) 10 MG tablet Take 5 mg by mouth daily.   1  . levETIRAcetam (KEPPRA) 250 MG tablet Take 250 mg by mouth every 12 (twelve) hours. With breakfast & with supper.    . levothyroxine (SYNTHROID, LEVOTHROID) 100 MCG tablet Take 100 mcg by mouth daily before breakfast.   0  . linagliptin (TRADJENTA) 5 MG TABS tablet Take 5 mg by mouth daily.    Marland Kitchen losartan (COZAAR) 25 MG tablet Take 25 mg by mouth daily.    . multivitamin (PROSIGHT) TABS tablet Take 1 tablet by mouth daily. 30 each 0  . nitroGLYCERIN (NITROSTAT) 0.4 MG SL tablet Place 0.4 mg under the tongue every 5 (five) minutes as needed for chest pain.    . Nutritional Supplements (FEEDING SUPPLEMENT, NEPRO CARB STEADY,) LIQD Take 237 mLs by mouth daily.  0  . Pancrelipase, Lip-Prot-Amyl, (CREON) 24000-76000 units CPEP Take 4 capsules by mouth 3 (three) times daily with meals.     . polyethylene glycol  (MIRALAX / GLYCOLAX) packet Take 17 g by mouth daily as needed for mild constipation (for constipation).     . potassium chloride SA (K-DUR,KLOR-CON) 20 MEQ tablet Take 1 tablet (20 mEq total) by mouth 2 (two) times daily. 60 tablet 0  . prednisoLONE acetate (PRED FORTE) 1 % ophthalmic suspension Place 1 drop  into both eyes at bedtime.     . traZODone (DESYREL) 50 MG tablet Take 50 mg by mouth at bedtime.    . calcitRIOL (ROCALTROL) 0.25 MCG capsule Take 1 capsule (0.25 mcg total) by mouth daily. (Patient not taking: Reported on 06/11/2018) 30 capsule 0  . calcium acetate (PHOSLO) 667 MG capsule Take 2 capsules (1,334 mg total) by mouth 3 (three) times daily with meals. (Patient not taking: Reported on 06/11/2018) 180 capsule 0   No current facility-administered medications for this visit.     REVIEW OF SYSTEMS:  [X]  denotes positive finding, [ ]  denotes negative finding Cardiac  Comments:  Chest pain or chest pressure:    Shortness of breath upon exertion:    Short of breath when lying flat:    Irregular heart rhythm:        Vascular    Pain in calf, thigh, or hip brought on by ambulation:    Pain in feet at night that wakes you up from your sleep:     Blood clot in your veins:    Leg swelling:         Pulmonary    Oxygen at home:    Productive cough:     Wheezing:         Neurologic    Sudden weakness in arms or legs:     Sudden numbness in arms or legs:     Sudden onset of difficulty speaking or slurred speech:    Temporary loss of vision in one eye:     Problems with dizziness:         Gastrointestinal    Blood in stool:     Vomited blood:         Genitourinary    Burning when urinating:     Blood in urine:        Psychiatric    Major depression:         Hematologic    Bleeding problems:    Problems with blood clotting too easily:        Skin    Rashes or ulcers:        Constitutional    Fever or chills:      PHYSICAL EXAM: Vitals:   06/18/18 1535  BP:  (!) 141/68  Pulse: 65  Resp: 20  Temp: (!) 97.1 F (36.2 C)  SpO2: 96%  Weight: 121 lb (54.9 kg)  Height: 5' 1"  (1.549 m)    GENERAL: The patient is a well-nourished female, in no acute distress. The vital signs are documented above. CARDIAC: There is a regular rate and rhythm.  VASCULAR:  Right upper arm graft, no thrill, redness along midline of graft all resolved, no fluctuance PULMONARY: There is good air exchange bilaterally without wheezing or rales. ABDOMEN: PD dialysis catheter. MUSCULOSKELETAL: There are no major deformities or cyanosis. NEUROLOGIC: No focal weakness or paresthesias are detected.  DATA:   None  Assessment/Plan:   I do not think Ms. Geter had a graft infection and I suspect that we were seeing a reaction from the graft thrombosing almost like a phlebitis.  All of the redness has resolved along the graft margin and she now has a fairly benign exam with no pain or tenderness along the graft.  Given these findings we did discuss possible graft thrombectomy to try and salvage her graft, but after extensive discussion with her and her son she has elected not to undergo more surgery with thrombectomy/revision.  Specifically she  is not using the graft and it has not been used in over a year and she is using PD dialysis.  This is the second time the graft has thrombosed and she has already undergone one revision with Dr. Oneida Alar in 2018 and she states that was a very prolonged and painful recovery.  I support this decision and she will contact me in the future if questions or concerns develop.  Marty Heck, MD [facility] [place] Office: [phone] Pager: [place]

## 2018-09-05 ENCOUNTER — Other Ambulatory Visit: Payer: Self-pay

## 2018-09-05 ENCOUNTER — Emergency Department (HOSPITAL_COMMUNITY): Payer: Medicare Other

## 2018-09-05 ENCOUNTER — Encounter (HOSPITAL_COMMUNITY): Payer: Self-pay | Admitting: Emergency Medicine

## 2018-09-05 ENCOUNTER — Inpatient Hospital Stay (HOSPITAL_COMMUNITY)
Admission: EM | Admit: 2018-09-05 | Discharge: 2018-09-08 | DRG: 246 | Disposition: A | Discharge status: Home / Self Care | Payer: Medicare Other | Attending: Cardiovascular Disease | Admitting: Cardiovascular Disease

## 2018-09-05 DIAGNOSIS — Z85828 Personal history of other malignant neoplasm of skin: Secondary | ICD-10-CM

## 2018-09-05 DIAGNOSIS — E119 Type 2 diabetes mellitus without complications: Secondary | ICD-10-CM

## 2018-09-05 DIAGNOSIS — G40909 Epilepsy, unspecified, not intractable, without status epilepticus: Secondary | ICD-10-CM | POA: Diagnosis present

## 2018-09-05 DIAGNOSIS — F329 Major depressive disorder, single episode, unspecified: Secondary | ICD-10-CM | POA: Diagnosis present

## 2018-09-05 DIAGNOSIS — Z79899 Other long term (current) drug therapy: Secondary | ICD-10-CM

## 2018-09-05 DIAGNOSIS — I213 ST elevation (STEMI) myocardial infarction of unspecified site: Secondary | ICD-10-CM | POA: Diagnosis present

## 2018-09-05 DIAGNOSIS — E785 Hyperlipidemia, unspecified: Secondary | ICD-10-CM | POA: Diagnosis present

## 2018-09-05 DIAGNOSIS — I120 Hypertensive chronic kidney disease with stage 5 chronic kidney disease or end stage renal disease: Secondary | ICD-10-CM | POA: Diagnosis present

## 2018-09-05 DIAGNOSIS — I252 Old myocardial infarction: Secondary | ICD-10-CM

## 2018-09-05 DIAGNOSIS — Z947 Corneal transplant status: Secondary | ICD-10-CM

## 2018-09-05 DIAGNOSIS — E079 Disorder of thyroid, unspecified: Secondary | ICD-10-CM | POA: Diagnosis present

## 2018-09-05 DIAGNOSIS — J302 Other seasonal allergic rhinitis: Secondary | ICD-10-CM | POA: Diagnosis present

## 2018-09-05 DIAGNOSIS — N186 End stage renal disease: Secondary | ICD-10-CM

## 2018-09-05 DIAGNOSIS — I472 Ventricular tachycardia: Secondary | ICD-10-CM | POA: Diagnosis present

## 2018-09-05 DIAGNOSIS — I2129 ST elevation (STEMI) myocardial infarction involving other sites: Secondary | ICD-10-CM

## 2018-09-05 DIAGNOSIS — K7460 Unspecified cirrhosis of liver: Secondary | ICD-10-CM | POA: Diagnosis present

## 2018-09-05 DIAGNOSIS — R569 Unspecified convulsions: Secondary | ICD-10-CM

## 2018-09-05 DIAGNOSIS — R079 Chest pain, unspecified: Secondary | ICD-10-CM

## 2018-09-05 DIAGNOSIS — I2510 Atherosclerotic heart disease of native coronary artery without angina pectoris: Secondary | ICD-10-CM | POA: Diagnosis present

## 2018-09-05 DIAGNOSIS — I10 Essential (primary) hypertension: Secondary | ICD-10-CM | POA: Diagnosis present

## 2018-09-05 DIAGNOSIS — D631 Anemia in chronic kidney disease: Secondary | ICD-10-CM | POA: Diagnosis present

## 2018-09-05 DIAGNOSIS — G4730 Sleep apnea, unspecified: Secondary | ICD-10-CM | POA: Diagnosis present

## 2018-09-05 DIAGNOSIS — Z9861 Coronary angioplasty status: Secondary | ICD-10-CM | POA: Diagnosis present

## 2018-09-05 DIAGNOSIS — I493 Ventricular premature depolarization: Secondary | ICD-10-CM | POA: Diagnosis present

## 2018-09-05 DIAGNOSIS — D649 Anemia, unspecified: Secondary | ICD-10-CM | POA: Diagnosis present

## 2018-09-05 DIAGNOSIS — E1165 Type 2 diabetes mellitus with hyperglycemia: Secondary | ICD-10-CM | POA: Diagnosis present

## 2018-09-05 DIAGNOSIS — I2542 Coronary artery dissection: Secondary | ICD-10-CM | POA: Diagnosis present

## 2018-09-05 DIAGNOSIS — K7581 Nonalcoholic steatohepatitis (NASH): Secondary | ICD-10-CM | POA: Diagnosis present

## 2018-09-05 DIAGNOSIS — E039 Hypothyroidism, unspecified: Secondary | ICD-10-CM | POA: Diagnosis present

## 2018-09-05 DIAGNOSIS — D696 Thrombocytopenia, unspecified: Secondary | ICD-10-CM | POA: Diagnosis present

## 2018-09-05 DIAGNOSIS — Z79890 Hormone replacement therapy (postmenopausal): Secondary | ICD-10-CM

## 2018-09-05 DIAGNOSIS — Z955 Presence of coronary angioplasty implant and graft: Secondary | ICD-10-CM

## 2018-09-05 DIAGNOSIS — Z992 Dependence on renal dialysis: Secondary | ICD-10-CM

## 2018-09-05 DIAGNOSIS — I69398 Other sequelae of cerebral infarction: Secondary | ICD-10-CM

## 2018-09-05 DIAGNOSIS — Z9109 Other allergy status, other than to drugs and biological substances: Secondary | ICD-10-CM

## 2018-09-05 DIAGNOSIS — E1122 Type 2 diabetes mellitus with diabetic chronic kidney disease: Secondary | ICD-10-CM | POA: Diagnosis present

## 2018-09-05 HISTORY — DX: Anemia, unspecified: D64.9

## 2018-09-05 HISTORY — DX: Atherosclerotic heart disease of native coronary artery without angina pectoris: I25.10

## 2018-09-05 NOTE — ED Triage Notes (Signed)
She was given 354m of ASA and her blood sugar was 424.

## 2018-09-05 NOTE — ED Triage Notes (Signed)
Patient is from home, complaining of chest pain, pressure in center of chest and radiating to shoulders.  EKG with NSR.  She is a home peritoneal dialysis and has had only about 1 hour of dialysis.  She took lactulose earlier in the day, she has had a couple of episodes of loose stool.  Patient CAOx4.

## 2018-09-05 NOTE — ED Provider Notes (Signed)
[facility] EMERGENCY DEPARTMENT Provider Note  CSN: [id number] Arrival date & time: 09/05/18 2318  Chief Complaint(s) Chest Pain  HPI Samantha Conrad is a 78 y.o. female with a past medical history listed below including ESRD on dialysis, hypertension who presents to the emergency department with sudden onset substernal chest pressure which she describes as someone sitting on her chest, that radiated to her left shoulder and had associated shortness of breath.  Patient also having nausea without emesis.  Brought in by EMS who gave the patient aspirin and nitroglycerin.  She reports that after the nitroglycerin her shortness of breath has significantly improved and her chest pain has also improved.  She denies any recent fevers or infections.  Denies any coughing or congestion.  At the time of the episode patient was using the bathroom as she has been having diarrhea after using lactulose for her constipation.  Patient denies any prior cardiac history.  HPI  Past Medical History Past Medical History:  Diagnosis Date  . Allergic rhinitis   . Basal cell carcinoma     on nose/face  . Constipation   . Depression   . Diarrhea due to drug   . Environmental and seasonal allergies   . ESRD on peritoneal dialysis ([place])    "q night" (11/29/2017)  . Fatty liver   . Headache    none since stroke in Dec. 2017 (11/29/2017)  . History of blood transfusion    "w/hysterectomy; w/kidney failure"  . Hypertension   . Hypothyroidism   . NASH (nonalcoholic steatohepatitis)   . Seizures ([place])    ? when she may have had a stroke sometime in the spring of 2018  . Sleep apnea    diagnosed years ago "after I'd gained alot of weight;  no longer a problem since weight loss"  . Stroke [facility]) 04/2016   memory issues, lost 1/2 vision in both eyes  . Thrombocytopenia ([place])   . Thyroid disease   . Type II diabetes mellitus [facility])    Patient Active Problem List   Diagnosis Date Noted  .  Symptomatic anemia 03/11/2017  . Diet-controlled diabetes mellitus ([place]) 03/11/2017  . NASH (nonalcoholic steatohepatitis) 03/11/2017  . Hypertension 03/11/2017  . Hypothyroidism 03/11/2017  . ESRD on dialysis ([place]) 03/11/2017  . Seizure disorder 03/11/2017  . Abdominal pain 03/11/2017  . HLD (hyperlipidemia) 03/11/2017  . Chronic pancreatitis ([place]) 03/11/2017  . Constipation 03/11/2017  . Laryngopharyngeal reflux (LPR) 02/17/2015  . H/O Mild allergic rhinitis 02/17/2015  . Cirrhosis ([place]) 02/17/2015   Home Medication(s) Prior to Admission medications   Medication Sig Start Date End Date Taking? Authorizing Provider  acetaminophen-codeine (TYLENOL #3) 300-30 MG tablet Take 1-2 tablets by mouth every 6 (six) hours as needed for moderate pain.    [provider]  Amino Acids-Protein Hydrolys (FEEDING SUPPLEMENT, PRO-STAT SUGAR FREE 64,) LIQD Take 30 mLs by mouth 2 (two) times daily. 03/16/17   Doreatha Lew, MD  bisacodyl (DULCOLAX) 10 MG suppository Place 10 mg rectally daily as needed (for constipation.).     [provider]  calcitRIOL (ROCALTROL) 0.25 MCG capsule Take 1 capsule (0.25 mcg total) by mouth daily. Patient not taking: Reported on 06/11/2018 12/01/17   Meccariello, Bernita Raisin, DO  calcium acetate (PHOSLO) 667 MG capsule Take 2 capsules (1,334 mg total) by mouth 3 (three) times daily with meals. Patient not taking: Reported on 06/11/2018 11/30/17   Meccariello, Bernita Raisin, DO  citalopram (CELEXA) 10 MG tablet Take 10  mg by mouth daily. 10/15/17   [provider]  furosemide (LASIX) 40 MG tablet Take 40 mg by mouth 2 (two) times daily.    [provider]  glipiZIDE (GLUCOTROL) 10 MG tablet Take 5 mg by mouth daily.  10/16/17   [provider]  levETIRAcetam (KEPPRA) 250 MG tablet Take 250 mg by mouth every 12 (twelve) hours. With breakfast & with supper.    [provider]  levothyroxine (SYNTHROID, LEVOTHROID) 100 MCG tablet  Take 100 mcg by mouth daily before breakfast.  09/02/16   [provider]  linagliptin (TRADJENTA) 5 MG TABS tablet Take 5 mg by mouth daily.    [provider]  losartan (COZAAR) 25 MG tablet Take 25 mg by mouth daily.    [provider]  multivitamin (PROSIGHT) TABS tablet Take 1 tablet by mouth daily. 03/17/17   Doreatha Lew, MD  nitroGLYCERIN (NITROSTAT) 0.4 MG SL tablet Place 0.4 mg under the tongue every 5 (five) minutes as needed for chest pain.    [provider]  Nutritional Supplements (FEEDING SUPPLEMENT, NEPRO CARB STEADY,) LIQD Take 237 mLs by mouth daily. 03/17/17   Doreatha Lew, MD  Pancrelipase, Lip-Prot-Amyl, (CREON) 24000-76000 units CPEP Take 4 capsules by mouth 3 (three) times daily with meals.     [provider]  polyethylene glycol (MIRALAX / GLYCOLAX) packet Take 17 g by mouth daily as needed for mild constipation (for constipation).     [provider]  potassium chloride SA (K-DUR,KLOR-CON) 20 MEQ tablet Take 1 tablet (20 mEq total) by mouth 2 (two) times daily. 11/30/17   Meccariello, Bernita Raisin, DO  prednisoLONE acetate (PRED FORTE) 1 % ophthalmic suspension Place 1 drop into both eyes at bedtime.     [provider]  traZODone (DESYREL) 50 MG tablet Take 50 mg by mouth at bedtime.    [provider]                                                                                                                                    Past Surgical History Past Surgical History:  Procedure Laterality Date  . ABDOMINAL HYSTERECTOMY    . AV FISTULA PLACEMENT Right 10/30/2016   Procedure: INSERTION OF ARTERIOVENOUS (AV) GORE-TEX GRAFT RIGHT UPPER ARM;  Surgeon: Elam Dutch, MD;  Location: [facility];  Service: Vascular;  Laterality: Right;  . CATARACT EXTRACTION W/ INTRAOCULAR LENS  IMPLANT, BILATERAL Bilateral   . CHOLECYSTECTOMY OPEN    . CORNEAL TRANSPLANT Bilateral   . EYE SURGERY    . KNEE  ARTHROSCOPY Left   . MOHS SURGERY     "nose"  . SHOULDER OPEN ROTATOR CUFF REPAIR Left   . THROMBECTOMY AND REVISION OF ARTERIOVENTOUS (AV) GORETEX  GRAFT Right 12/04/2016   Procedure: THROMBECTOMY/ REVISION OF RIGHT UPPER ARM ARTERIOVENOUS GORETEX GRAFT;  Surgeon: Elam Dutch, MD;  Location: [place];  Service:  Vascular;  Laterality: Right;  . TONSILLECTOMY     Family History No family history on file.  Social History Social History   Tobacco Use  . Smoking status: Never Smoker  . Smokeless tobacco: Former Systems developer    Types: Snuff  . Tobacco comment: "no snuff since < 1980s"  Substance Use Topics  . Alcohol use: Never    Frequency: Never  . Drug use: Never   Allergies Contrast media [iodinated diagnostic agents] and Tape  Review of Systems Review of Systems All other systems are reviewed and are negative for acute change except as noted in the HPI  Physical Exam Vital Signs  I have reviewed the triage vital signs BP 121/75   Pulse 99   Temp 98.1 F (36.7 C) (Oral)   Resp 14   Ht 5' 1"  (1.549 m)   Wt 57.2 kg Comment: dry weight  SpO2 95%   BMI 23.81 kg/m   Physical Exam Vitals signs reviewed.  Constitutional:      General: She is not in acute distress.    Appearance: She is well-developed. She is not diaphoretic.  HENT:     Head: Normocephalic and atraumatic.     Nose: Nose normal.  Eyes:     General: No scleral icterus.       Right eye: No discharge.        Left eye: No discharge.     Conjunctiva/sclera: Conjunctivae normal.     Pupils: Pupils are equal, round, and reactive to light.  Neck:     Musculoskeletal: Normal range of motion and neck supple.  Cardiovascular:     Rate and Rhythm: Normal rate and regular rhythm.     Heart sounds: No murmur. No friction rub. No gallop.   Pulmonary:     Effort: Pulmonary effort is normal. No respiratory distress.     Breath sounds: Normal breath sounds. No stridor. No rales.  Abdominal:     General: There is no  distension.     Palpations: Abdomen is soft.     Tenderness: There is no abdominal tenderness.  Musculoskeletal:        General: No tenderness.  Skin:    General: Skin is warm and dry.     Findings: No erythema or rash.  Neurological:     Mental Status: She is alert and oriented to person, place, and time.     ED Results and Treatments Labs (all labs ordered are listed, but only abnormal results are displayed) Labs Reviewed  CBC - Abnormal; Notable for the following components:      Result Value   RBC 3.77 (*)    Hemoglobin 11.1 (*)    HCT 35.2 (*)    All other components within normal limits  PROTIME-INR  APTT  COMPREHENSIVE METABOLIC PANEL  TROPONIN I  MAGNESIUM  LIPID PANEL  EKG   -This EKG is concerning for posterior MI showing ST segment depression in V2 and V3.    This posterior EKG shows conduction variance with nonsustained V. Tach.  But still shows ST segment elevation in posterior leads.   Radiology Dg Chest Port 1 View  Result Date: 09/06/2018 CLINICAL DATA:  Chest pain. EXAM: PORTABLE CHEST 1 VIEW COMPARISON:  Frontal and lateral views 12/11/2017 FINDINGS: Low lung volumes.The cardiomediastinal contours are normal. Minimal right lung base atelectasis. Pulmonary vasculature is normal. No consolidation, pleural effusion, or pneumothorax. No acute osseous abnormalities are seen. IMPRESSION: Low lung volumes with minimal right lung base atelectasis. Electronically Signed   By: Keith Rake M.D.   On: 09/06/2018 00:16   Pertinent labs & imaging results that were available during my care of the patient were reviewed by me and considered in my medical decision making (see chart for details).  Medications Ordered in ED Medications  0.9 %  sodium chloride infusion (10 mL/hr Intravenous New Bag/Given 09/06/18 0005)  nitroGLYCERIN (NITROSTAT) SL  tablet 0.4 mg (0.4 mg Sublingual Given 09/06/18 0013)  heparin injection 3,450 Units (3,450 Units Intravenous Given 09/06/18 0007)  ondansetron (ZOFRAN) injection 4 mg (4 mg Intravenous Given 09/06/18 0026)                                                                                                                                    Procedures Procedures CRITICAL CARE Performed by: Grayce Sessions Tawanna Funk Total critical care time: 35 minutes Critical care time was exclusive of separately billable procedures and treating other patients. Critical care was necessary to treat or prevent imminent or life-threatening deterioration. Critical care was time spent personally by me on the following activities: development of treatment plan with patient and/or surrogate as well as nursing, discussions with consultants, evaluation of patient's response to treatment, examination of patient, obtaining history from patient or surrogate, ordering and performing treatments and interventions, ordering and review of laboratory studies, ordering and review of radiographic studies, pulse oximetry and re-evaluation of patient's condition.   (including critical care time)  Medical Decision Making / ED Course I have reviewed the nursing notes for this encounter and the patient's prior records (if available in EHR or on provided paperwork).    Went into the room I noted the patient was in bigeminy with intermittent nonsustained VT.  Presentation is concerning for posterior MI on initial EKG.  Posterior EKG obtained which is also highly concerning for posterior ST segment elevation MI.  Code STEMI was initiated.  I discussed the case with Dr. Gwenlyn Found, who sent the fellow down to evaluate the patient who also agrees that this is likely a posterior MI.  Patient has already gotten her aspirin.  Heparin drip was initiated.  Labs obtained.  Due to the intermittent nonsustained VT, patient was placed on pads.  Patient is still  hemodynamically stable.    Final Clinical Impression(s) /  ED Diagnoses Final diagnoses:  Chest pain  Posterior MI [facility])      This chart was dictated using voice recognition software.  Despite best efforts to proofread,  errors can occur which can change the documentation meaning.   Fatima Blank, MD 09/07/18 [phone]

## 2018-09-06 ENCOUNTER — Inpatient Hospital Stay (HOSPITAL_COMMUNITY): Payer: Medicare Other

## 2018-09-06 ENCOUNTER — Encounter (HOSPITAL_COMMUNITY)
Admission: EM | Disposition: A | Discharge status: Home / Self Care | Payer: Self-pay | Source: Home / Self Care | Attending: Cardiovascular Disease

## 2018-09-06 ENCOUNTER — Encounter (HOSPITAL_COMMUNITY): Payer: Self-pay | Admitting: Certified Registered Nurse Anesthetist

## 2018-09-06 DIAGNOSIS — I2121 ST elevation (STEMI) myocardial infarction involving left circumflex coronary artery: Secondary | ICD-10-CM | POA: Diagnosis not present

## 2018-09-06 DIAGNOSIS — J302 Other seasonal allergic rhinitis: Secondary | ICD-10-CM | POA: Diagnosis present

## 2018-09-06 DIAGNOSIS — D696 Thrombocytopenia, unspecified: Secondary | ICD-10-CM | POA: Diagnosis present

## 2018-09-06 DIAGNOSIS — N186 End stage renal disease: Secondary | ICD-10-CM | POA: Diagnosis present

## 2018-09-06 DIAGNOSIS — I120 Hypertensive chronic kidney disease with stage 5 chronic kidney disease or end stage renal disease: Secondary | ICD-10-CM | POA: Diagnosis present

## 2018-09-06 DIAGNOSIS — Z79899 Other long term (current) drug therapy: Secondary | ICD-10-CM | POA: Diagnosis not present

## 2018-09-06 DIAGNOSIS — I2510 Atherosclerotic heart disease of native coronary artery without angina pectoris: Secondary | ICD-10-CM

## 2018-09-06 DIAGNOSIS — I69398 Other sequelae of cerebral infarction: Secondary | ICD-10-CM | POA: Diagnosis not present

## 2018-09-06 DIAGNOSIS — I252 Old myocardial infarction: Secondary | ICD-10-CM

## 2018-09-06 DIAGNOSIS — I2129 ST elevation (STEMI) myocardial infarction involving other sites: Secondary | ICD-10-CM | POA: Diagnosis present

## 2018-09-06 DIAGNOSIS — G4730 Sleep apnea, unspecified: Secondary | ICD-10-CM | POA: Diagnosis present

## 2018-09-06 DIAGNOSIS — E118 Type 2 diabetes mellitus with unspecified complications: Secondary | ICD-10-CM

## 2018-09-06 DIAGNOSIS — I213 ST elevation (STEMI) myocardial infarction of unspecified site: Secondary | ICD-10-CM

## 2018-09-06 DIAGNOSIS — K7581 Nonalcoholic steatohepatitis (NASH): Secondary | ICD-10-CM | POA: Diagnosis present

## 2018-09-06 DIAGNOSIS — E785 Hyperlipidemia, unspecified: Secondary | ICD-10-CM | POA: Diagnosis present

## 2018-09-06 DIAGNOSIS — I340 Nonrheumatic mitral (valve) insufficiency: Secondary | ICD-10-CM

## 2018-09-06 DIAGNOSIS — Z992 Dependence on renal dialysis: Secondary | ICD-10-CM

## 2018-09-06 DIAGNOSIS — K7460 Unspecified cirrhosis of liver: Secondary | ICD-10-CM | POA: Diagnosis present

## 2018-09-06 DIAGNOSIS — E1165 Type 2 diabetes mellitus with hyperglycemia: Secondary | ICD-10-CM | POA: Diagnosis present

## 2018-09-06 DIAGNOSIS — I472 Ventricular tachycardia: Secondary | ICD-10-CM | POA: Diagnosis present

## 2018-09-06 DIAGNOSIS — E1122 Type 2 diabetes mellitus with diabetic chronic kidney disease: Secondary | ICD-10-CM | POA: Diagnosis present

## 2018-09-06 DIAGNOSIS — G40909 Epilepsy, unspecified, not intractable, without status epilepticus: Secondary | ICD-10-CM | POA: Diagnosis present

## 2018-09-06 DIAGNOSIS — Z9109 Other allergy status, other than to drugs and biological substances: Secondary | ICD-10-CM | POA: Diagnosis not present

## 2018-09-06 DIAGNOSIS — D631 Anemia in chronic kidney disease: Secondary | ICD-10-CM | POA: Diagnosis present

## 2018-09-06 DIAGNOSIS — Z79890 Hormone replacement therapy (postmenopausal): Secondary | ICD-10-CM | POA: Diagnosis not present

## 2018-09-06 DIAGNOSIS — I361 Nonrheumatic tricuspid (valve) insufficiency: Secondary | ICD-10-CM

## 2018-09-06 DIAGNOSIS — E079 Disorder of thyroid, unspecified: Secondary | ICD-10-CM | POA: Diagnosis present

## 2018-09-06 DIAGNOSIS — Z85828 Personal history of other malignant neoplasm of skin: Secondary | ICD-10-CM | POA: Diagnosis not present

## 2018-09-06 DIAGNOSIS — I10 Essential (primary) hypertension: Secondary | ICD-10-CM

## 2018-09-06 DIAGNOSIS — I2542 Coronary artery dissection: Secondary | ICD-10-CM | POA: Diagnosis present

## 2018-09-06 DIAGNOSIS — E039 Hypothyroidism, unspecified: Secondary | ICD-10-CM | POA: Diagnosis present

## 2018-09-06 DIAGNOSIS — I493 Ventricular premature depolarization: Secondary | ICD-10-CM | POA: Diagnosis present

## 2018-09-06 HISTORY — PX: CORONARY/GRAFT ACUTE MI REVASCULARIZATION: CATH118305

## 2018-09-06 HISTORY — PX: LEFT HEART CATH AND CORONARY ANGIOGRAPHY: CATH118249

## 2018-09-06 HISTORY — PX: CORONARY ANGIOGRAPHY: CATH118303

## 2018-09-06 LAB — CBC
HCT: 34 % — ABNORMAL LOW (ref 36.0–46.0)
HCT: 35.2 % — ABNORMAL LOW (ref 36.0–46.0)
Hemoglobin: 10.8 g/dL — ABNORMAL LOW (ref 12.0–15.0)
Hemoglobin: 11.1 g/dL — ABNORMAL LOW (ref 12.0–15.0)
MCH: 29.3 pg (ref 26.0–34.0)
MCH: 29.4 pg (ref 26.0–34.0)
MCHC: 31.5 g/dL (ref 30.0–36.0)
MCHC: 31.8 g/dL (ref 30.0–36.0)
MCV: 92.4 fL (ref 80.0–100.0)
MCV: 93.4 fL (ref 80.0–100.0)
Platelets: 88 10*3/uL — ABNORMAL LOW (ref 150–400)
Platelets: 94 10*3/uL — ABNORMAL LOW (ref 150–400)
RBC: 3.68 MIL/uL — ABNORMAL LOW (ref 3.87–5.11)
RBC: 3.77 MIL/uL — ABNORMAL LOW (ref 3.87–5.11)
RDW: 13.9 % (ref 11.5–15.5)
RDW: 14 % (ref 11.5–15.5)
WBC: 8.4 10*3/uL (ref 4.0–10.5)
WBC: 9.6 10*3/uL (ref 4.0–10.5)
nRBC: 0 % (ref 0.0–0.2)
nRBC: 0 % (ref 0.0–0.2)

## 2018-09-06 LAB — COMPREHENSIVE METABOLIC PANEL
ALT: 19 U/L (ref 0–44)
AST: 37 U/L (ref 15–41)
Albumin: 2.4 g/dL — ABNORMAL LOW (ref 3.5–5.0)
Alkaline Phosphatase: 147 U/L — ABNORMAL HIGH (ref 38–126)
Anion gap: 19 — ABNORMAL HIGH (ref 5–15)
BUN: 38 mg/dL — ABNORMAL HIGH (ref 8–23)
CO2: 20 mmol/L — ABNORMAL LOW (ref 22–32)
Calcium: 8.8 mg/dL — ABNORMAL LOW (ref 8.9–10.3)
Chloride: 94 mmol/L — ABNORMAL LOW (ref 98–111)
Creatinine, Ser: 5.42 mg/dL — ABNORMAL HIGH (ref 0.44–1.00)
GFR calc Af Amer: 8 mL/min — ABNORMAL LOW (ref >60)
GFR calc non Af Amer: 7 mL/min — ABNORMAL LOW (ref >60)
Glucose, Bld: 346 mg/dL — ABNORMAL HIGH (ref 70–99)
Potassium: 3.4 mmol/L — ABNORMAL LOW (ref 3.5–5.1)
Sodium: 133 mmol/L — ABNORMAL LOW (ref 135–145)
Total Bilirubin: 0.4 mg/dL (ref 0.3–1.2)
Total Protein: 5.9 g/dL — ABNORMAL LOW (ref 6.5–8.1)

## 2018-09-06 LAB — BASIC METABOLIC PANEL
Anion gap: 16 — ABNORMAL HIGH (ref 5–15)
BUN: 41 mg/dL — ABNORMAL HIGH (ref 8–23)
CO2: 21 mmol/L — ABNORMAL LOW (ref 22–32)
Calcium: 8.4 mg/dL — ABNORMAL LOW (ref 8.9–10.3)
Chloride: 93 mmol/L — ABNORMAL LOW (ref 98–111)
Creatinine, Ser: 5.25 mg/dL — ABNORMAL HIGH (ref 0.44–1.00)
GFR calc Af Amer: 8 mL/min — ABNORMAL LOW (ref >60)
GFR calc non Af Amer: 7 mL/min — ABNORMAL LOW (ref >60)
Glucose, Bld: 333 mg/dL — ABNORMAL HIGH (ref 70–99)
Potassium: 4 mmol/L (ref 3.5–5.1)
Sodium: 130 mmol/L — ABNORMAL LOW (ref 135–145)

## 2018-09-06 LAB — POCT ACTIVATED CLOTTING TIME
Activated Clotting Time: 202 seconds
Activated Clotting Time: 180 seconds
Activated Clotting Time: 169 seconds
Activated Clotting Time: 164 seconds

## 2018-09-06 LAB — LIPID PANEL
Cholesterol: 160 mg/dL (ref 0–200)
HDL: 74 mg/dL (ref >40)
LDL Cholesterol: 54 mg/dL (ref 0–99)
Total CHOL/HDL Ratio: 2.2 RATIO
Triglycerides: 159 mg/dL — ABNORMAL HIGH (ref <150)
VLDL: 32 mg/dL (ref 0–40)

## 2018-09-06 LAB — ECHOCARDIOGRAM COMPLETE
Height: 61 in
Weight: 2169.33 oz

## 2018-09-06 LAB — MAGNESIUM: Magnesium: 1.6 mg/dL — ABNORMAL LOW (ref 1.7–2.4)

## 2018-09-06 LAB — PROTIME-INR
INR: 1 (ref 0.8–1.2)
Prothrombin Time: 13.5 seconds (ref 11.4–15.2)

## 2018-09-06 LAB — GLUCOSE, CAPILLARY
Glucose-Capillary: 300 mg/dL — ABNORMAL HIGH (ref 70–99)
Glucose-Capillary: 275 mg/dL — ABNORMAL HIGH (ref 70–99)

## 2018-09-06 LAB — APTT: aPTT: 32 seconds (ref 24–36)

## 2018-09-06 LAB — TROPONIN I
Troponin I: 1.27 ng/mL (ref <0.03)
Troponin I: >65 ng/mL (ref <0.03)
Troponin I: >65 ng/mL (ref <0.03)

## 2018-09-06 LAB — MRSA PCR SCREENING: MRSA by PCR: NEGATIVE

## 2018-09-06 SURGERY — CORONARY/GRAFT ACUTE MI REVASCULARIZATION
Anesthesia: LOCAL

## 2018-09-06 MED ORDER — CLOPIDOGREL BISULFATE 300 MG PO TABS
300.0000 mg | ORAL_TABLET | Freq: Once | ORAL | Status: AC
  Administered 2018-09-06: 17:00:00 300 mg via ORAL
  Filled 2018-09-06: qty 1

## 2018-09-06 MED ORDER — METHYLPREDNISOLONE SODIUM SUCC 125 MG IJ SOLR
INTRAMUSCULAR | Status: DC | PRN
  Administered 2018-09-06: 125 mg via INTRAVENOUS

## 2018-09-06 MED ORDER — TICAGRELOR 90 MG PO TABS
ORAL_TABLET | ORAL | Status: DC | PRN
  Administered 2018-09-06: 180 mg via ORAL

## 2018-09-06 MED ORDER — LEVETIRACETAM 250 MG PO TABS
250.0000 mg | ORAL_TABLET | Freq: Two times a day (BID) | ORAL | Status: DC
  Administered 2018-09-06 – 2018-09-08 (×5): 250 mg via ORAL
  Filled 2018-09-06 (×6): qty 1

## 2018-09-06 MED ORDER — FUROSEMIDE 40 MG PO TABS
40.0000 mg | ORAL_TABLET | Freq: Two times a day (BID) | ORAL | Status: DC
  Administered 2018-09-06 – 2018-09-08 (×5): 40 mg via ORAL
  Filled 2018-09-06 (×5): qty 1

## 2018-09-06 MED ORDER — PROSIGHT PO TABS
1.0000 | ORAL_TABLET | Freq: Every day | ORAL | Status: DC
  Administered 2018-09-06 – 2018-09-08 (×3): 1 via ORAL
  Filled 2018-09-06 (×3): qty 1

## 2018-09-06 MED ORDER — CARVEDILOL 3.125 MG PO TABS
3.1250 mg | ORAL_TABLET | Freq: Two times a day (BID) | ORAL | Status: DC
  Administered 2018-09-06 – 2018-09-08 (×5): 3.125 mg via ORAL
  Filled 2018-09-06 (×5): qty 1

## 2018-09-06 MED ORDER — IOHEXOL 350 MG/ML SOLN
INTRAVENOUS | Status: DC | PRN
  Administered 2018-09-06: 02:00:00 140 mL via INTRA_ARTERIAL

## 2018-09-06 MED ORDER — POTASSIUM CHLORIDE CRYS ER 20 MEQ PO TBCR
20.0000 meq | EXTENDED_RELEASE_TABLET | Freq: Two times a day (BID) | ORAL | Status: DC
  Administered 2018-09-06 – 2018-09-08 (×4): 20 meq via ORAL
  Filled 2018-09-06 (×4): qty 1

## 2018-09-06 MED ORDER — ONDANSETRON HCL 4 MG/2ML IJ SOLN
4.0000 mg | Freq: Once | INTRAMUSCULAR | Status: AC
  Administered 2018-09-06: 4 mg via INTRAVENOUS

## 2018-09-06 MED ORDER — CITALOPRAM HYDROBROMIDE 20 MG PO TABS
10.0000 mg | ORAL_TABLET | Freq: Every day | ORAL | Status: DC
  Administered 2018-09-06 – 2018-09-08 (×3): 10 mg via ORAL
  Filled 2018-09-06 (×3): qty 1

## 2018-09-06 MED ORDER — TRAZODONE HCL 50 MG PO TABS
50.0000 mg | ORAL_TABLET | Freq: Every day | ORAL | Status: DC
  Administered 2018-09-06 – 2018-09-07 (×2): 50 mg via ORAL
  Filled 2018-09-06 (×2): qty 1

## 2018-09-06 MED ORDER — SODIUM CHLORIDE 0.9 % IV SOLN
0.2500 mg/kg/h | INTRAVENOUS | Status: AC
  Filled 2018-09-06: qty 250

## 2018-09-06 MED ORDER — NITROGLYCERIN 0.4 MG SL SUBL
SUBLINGUAL_TABLET | SUBLINGUAL | Status: AC
  Filled 2018-09-06: qty 1

## 2018-09-06 MED ORDER — LINAGLIPTIN 5 MG PO TABS
5.0000 mg | ORAL_TABLET | Freq: Every day | ORAL | Status: DC
  Administered 2018-09-06 – 2018-09-08 (×3): 5 mg via ORAL
  Filled 2018-09-06 (×4): qty 1

## 2018-09-06 MED ORDER — SODIUM CHLORIDE 0.9 % IV SOLN
INTRAVENOUS | Status: AC | PRN
  Administered 2018-09-06: 57 mL/h via INTRAVENOUS

## 2018-09-06 MED ORDER — POTASSIUM CHLORIDE CRYS ER 20 MEQ PO TBCR: 20.0000 meq | EXTENDED_RELEASE_TABLET | Freq: Two times a day (BID) | ORAL | Status: DC

## 2018-09-06 MED ORDER — DELFLEX-LC/2.5% DEXTROSE 394 MOSM/L IP SOLN: INTRAPERITONEAL | Status: DC

## 2018-09-06 MED ORDER — POLYETHYLENE GLYCOL 3350 17 G PO PACK: 17.0000 g | PACK | Freq: Every day | ORAL | Status: DC | PRN

## 2018-09-06 MED ORDER — HEPARIN SODIUM (PORCINE) 5000 UNIT/ML IJ SOLN
60.0000 [IU]/kg | Freq: Once | INTRAMUSCULAR | Status: AC
  Administered 2018-09-06: 3450 [IU] via INTRAVENOUS
  Filled 2018-09-06: qty 1

## 2018-09-06 MED ORDER — PRO-STAT SUGAR FREE PO LIQD
30.0000 mL | Freq: Two times a day (BID) | ORAL | Status: DC
  Administered 2018-09-06 – 2018-09-08 (×5): 30 mL via ORAL
  Filled 2018-09-06 (×5): qty 30

## 2018-09-06 MED ORDER — LEVOTHYROXINE SODIUM 100 MCG PO TABS
100.0000 ug | ORAL_TABLET | Freq: Every day | ORAL | Status: DC
  Administered 2018-09-06 – 2018-09-08 (×3): 100 ug via ORAL
  Filled 2018-09-06 (×3): qty 1

## 2018-09-06 MED ORDER — SODIUM CHLORIDE 0.9 % IV SOLN: INTRAVENOUS | Status: AC

## 2018-09-06 MED ORDER — ACETAMINOPHEN-CODEINE #3 300-30 MG PO TABS: 1.0000 | ORAL_TABLET | Freq: Four times a day (QID) | ORAL | Status: DC | PRN

## 2018-09-06 MED ORDER — NITROGLYCERIN 0.4 MG SL SUBL
0.4000 mg | SUBLINGUAL_TABLET | SUBLINGUAL | Status: DC | PRN
  Administered 2018-09-06: 0.4 mg via SUBLINGUAL

## 2018-09-06 MED ORDER — SODIUM CHLORIDE 0.9% FLUSH
3.0000 mL | Freq: Two times a day (BID) | INTRAVENOUS | Status: DC
  Administered 2018-09-06 – 2018-09-07 (×3): 3 mL via INTRAVENOUS

## 2018-09-06 MED ORDER — MORPHINE SULFATE (PF) 2 MG/ML IV SOLN: 2.0000 mg | INTRAVENOUS | Status: DC | PRN

## 2018-09-06 MED ORDER — HEPARIN 1000 UNIT/ML FOR PERITONEAL DIALYSIS: 500.0000 [IU] | INTRAMUSCULAR | Status: DC | PRN

## 2018-09-06 MED ORDER — SODIUM CHLORIDE 0.9% FLUSH: 3.0000 mL | INTRAVENOUS | Status: DC | PRN

## 2018-09-06 MED ORDER — BISACODYL 10 MG RE SUPP: 10.0000 mg | Freq: Every day | RECTAL | Status: DC | PRN

## 2018-09-06 MED ORDER — INSULIN ASPART 100 UNIT/ML ~~LOC~~ SOLN
0.0000 [IU] | SUBCUTANEOUS | Status: DC
  Administered 2018-09-06 (×2): 5 [IU] via SUBCUTANEOUS
  Administered 2018-09-07: 2 [IU] via SUBCUTANEOUS
  Administered 2018-09-07: 3 [IU] via SUBCUTANEOUS
  Administered 2018-09-07: 2 [IU] via SUBCUTANEOUS
  Administered 2018-09-07: 3 [IU] via SUBCUTANEOUS
  Administered 2018-09-07: 1 [IU] via SUBCUTANEOUS

## 2018-09-06 MED ORDER — GENTAMICIN SULFATE 0.1 % EX CREA: 1.0000 "application " | TOPICAL_CREAM | Freq: Every day | CUTANEOUS | Status: DC

## 2018-09-06 MED ORDER — ASPIRIN 81 MG PO CHEW
81.0000 mg | CHEWABLE_TABLET | Freq: Every day | ORAL | Status: DC
  Administered 2018-09-06 – 2018-09-08 (×3): 81 mg via ORAL
  Filled 2018-09-06 (×4): qty 1

## 2018-09-06 MED ORDER — SODIUM CHLORIDE 0.9 % IV SOLN: 250.0000 mL | INTRAVENOUS | Status: DC | PRN

## 2018-09-06 MED ORDER — NITROGLYCERIN 1 MG/10 ML FOR IR/CATH LAB
INTRA_ARTERIAL | Status: AC
  Filled 2018-09-06: qty 10

## 2018-09-06 MED ORDER — LOSARTAN POTASSIUM 25 MG PO TABS
25.0000 mg | ORAL_TABLET | Freq: Every day | ORAL | Status: DC
  Administered 2018-09-06 – 2018-09-08 (×3): 25 mg via ORAL
  Filled 2018-09-06 (×4): qty 1

## 2018-09-06 MED ORDER — LIDOCAINE HCL (PF) 1 % IJ SOLN
INTRAMUSCULAR | Status: AC
  Filled 2018-09-06: qty 30

## 2018-09-06 MED ORDER — HEPARIN (PORCINE) IN NACL 1000-0.9 UT/500ML-% IV SOLN
INTRAVENOUS | Status: DC | PRN
  Administered 2018-09-06 (×2): 500 mL

## 2018-09-06 MED ORDER — CALCITRIOL 0.25 MCG PO CAPS
0.2500 ug | ORAL_CAPSULE | Freq: Every day | ORAL | Status: DC
  Administered 2018-09-06 – 2018-09-08 (×3): 0.25 ug via ORAL
  Filled 2018-09-06 (×4): qty 1

## 2018-09-06 MED ORDER — HEPARIN 1000 UNIT/ML FOR PERITONEAL DIALYSIS
INTRAPERITONEAL | Status: DC | PRN
  Filled 2018-09-06: qty 5000

## 2018-09-06 MED ORDER — BIVALIRUDIN TRIFLUOROACETATE 250 MG IV SOLR
INTRAVENOUS | Status: AC
  Filled 2018-09-06: qty 250

## 2018-09-06 MED ORDER — CALCIUM ACETATE (PHOS BINDER) 667 MG PO CAPS
1334.0000 mg | ORAL_CAPSULE | Freq: Three times a day (TID) | ORAL | Status: DC
  Administered 2018-09-06 – 2018-09-08 (×8): 1334 mg via ORAL
  Filled 2018-09-06 (×8): qty 2

## 2018-09-06 MED ORDER — CLOPIDOGREL BISULFATE 75 MG PO TABS
75.0000 mg | ORAL_TABLET | Freq: Every day | ORAL | Status: DC
  Administered 2018-09-07 – 2018-09-08 (×2): 75 mg via ORAL
  Filled 2018-09-06 (×2): qty 1

## 2018-09-06 MED ORDER — ONDANSETRON HCL 4 MG/2ML IJ SOLN
INTRAMUSCULAR | Status: AC
  Filled 2018-09-06: qty 2

## 2018-09-06 MED ORDER — SODIUM CHLORIDE 0.9 % IV SOLN
INTRAVENOUS | Status: AC | PRN
  Administered 2018-09-06: 01:00:00 0.25 mg/kg/h via INTRAVENOUS

## 2018-09-06 MED ORDER — LABETALOL HCL 5 MG/ML IV SOLN: 10.0000 mg | INTRAVENOUS | Status: AC | PRN

## 2018-09-06 MED ORDER — CHLORHEXIDINE GLUCONATE CLOTH 2 % EX PADS
6.0000 | MEDICATED_PAD | Freq: Every day | CUTANEOUS | Status: DC
  Administered 2018-09-06 – 2018-09-07 (×2): 6 via TOPICAL

## 2018-09-06 MED ORDER — NEPRO/CARBSTEADY PO LIQD
237.0000 mL | ORAL | Status: DC
  Administered 2018-09-06: 237 mL via ORAL
  Filled 2018-09-06 (×2): qty 237

## 2018-09-06 MED ORDER — BIVALIRUDIN BOLUS VIA INFUSION - CUPID
INTRAVENOUS | Status: DC | PRN
  Administered 2018-09-06: 01:00:00 42.9 mg via INTRAVENOUS

## 2018-09-06 MED ORDER — PREDNISOLONE ACETATE 1 % OP SUSP
1.0000 [drp] | Freq: Every day | OPHTHALMIC | Status: DC
  Administered 2018-09-06 – 2018-09-07 (×2): 1 [drp] via OPHTHALMIC
  Filled 2018-09-06: qty 5

## 2018-09-06 MED ORDER — TICAGRELOR 90 MG PO TABS
ORAL_TABLET | ORAL | Status: AC
  Filled 2018-09-06: qty 2

## 2018-09-06 MED ORDER — ONDANSETRON HCL 4 MG/2ML IJ SOLN: 4.0000 mg | Freq: Four times a day (QID) | INTRAMUSCULAR | Status: DC | PRN

## 2018-09-06 MED ORDER — ATORVASTATIN CALCIUM 40 MG PO TABS
40.0000 mg | ORAL_TABLET | Freq: Every day | ORAL | Status: DC
  Administered 2018-09-06 – 2018-09-07 (×2): 40 mg via ORAL
  Filled 2018-09-06 (×2): qty 1

## 2018-09-06 MED ORDER — GENTAMICIN SULFATE 0.1 % EX CREA
1.0000 "application " | TOPICAL_CREAM | Freq: Every day | CUTANEOUS | Status: DC
  Administered 2018-09-06 – 2018-09-07 (×2): 1 via TOPICAL
  Filled 2018-09-06: qty 15

## 2018-09-06 MED ORDER — POTASSIUM CHLORIDE CRYS ER 20 MEQ PO TBCR
20.0000 meq | EXTENDED_RELEASE_TABLET | Freq: Once | ORAL | Status: AC
  Administered 2018-09-06: 20 meq via ORAL
  Filled 2018-09-06: qty 1

## 2018-09-06 MED ORDER — METHYLPREDNISOLONE SODIUM SUCC 125 MG IJ SOLR
INTRAMUSCULAR | Status: AC
  Filled 2018-09-06: qty 2

## 2018-09-06 MED ORDER — NITROGLYCERIN 0.4 MG SL SUBL: 0.4000 mg | SUBLINGUAL_TABLET | SUBLINGUAL | Status: DC | PRN

## 2018-09-06 MED ORDER — DIPHENHYDRAMINE HCL 50 MG/ML IJ SOLN
INTRAMUSCULAR | Status: AC
  Filled 2018-09-06: qty 1

## 2018-09-06 MED ORDER — HYDRALAZINE HCL 20 MG/ML IJ SOLN: 10.0000 mg | INTRAMUSCULAR | Status: AC | PRN

## 2018-09-06 MED ORDER — HEPARIN (PORCINE) IN NACL 1000-0.9 UT/500ML-% IV SOLN
INTRAVENOUS | Status: AC
  Filled 2018-09-06: qty 1000

## 2018-09-06 MED ORDER — DELFLEX-LC/2.5% DEXTROSE 394 MOSM/L IP SOLN
INTRAPERITONEAL | Status: DC
  Administered 2018-09-06: 20:00:00 via INTRAPERITONEAL
  Administered 2018-09-07: 5000 mL via INTRAPERITONEAL

## 2018-09-06 MED ORDER — ACETAMINOPHEN 325 MG PO TABS: 650.0000 mg | ORAL_TABLET | ORAL | Status: DC | PRN

## 2018-09-06 MED ORDER — MORPHINE SULFATE (PF) 10 MG/ML IV SOLN: 2.0000 mg | INTRAVENOUS | Status: DC | PRN

## 2018-09-06 MED ORDER — DIPHENHYDRAMINE HCL 50 MG/ML IJ SOLN
INTRAMUSCULAR | Status: DC | PRN
  Administered 2018-09-06: 25 mg via INTRAVENOUS

## 2018-09-06 MED ORDER — TICAGRELOR 90 MG PO TABS: 90.0000 mg | ORAL_TABLET | Freq: Two times a day (BID) | ORAL | Status: DC

## 2018-09-06 MED ORDER — GLIPIZIDE 5 MG PO TABS
5.0000 mg | ORAL_TABLET | Freq: Every day | ORAL | Status: DC
  Administered 2018-09-06 – 2018-09-08 (×3): 5 mg via ORAL
  Filled 2018-09-06 (×3): qty 1

## 2018-09-06 MED ORDER — SODIUM CHLORIDE 0.9 % IV SOLN
INTRAVENOUS | Status: DC
  Administered 2018-09-06: 10 mL/h via INTRAVENOUS

## 2018-09-06 MED ORDER — NITROGLYCERIN 1 MG/10 ML FOR IR/CATH LAB
INTRA_ARTERIAL | Status: DC | PRN
  Administered 2018-09-06: 200 ug via INTRACORONARY

## 2018-09-06 MED ORDER — LIDOCAINE HCL (PF) 1 % IJ SOLN
INTRAMUSCULAR | Status: DC | PRN
  Administered 2018-09-06: 20 mL

## 2018-09-06 SURGICAL SUPPLY — 22 items
BALLN SAPPHIRE 2.0X10 (BALLOONS) ×2
BALLN SAPPHIRE 2.0X12 (BALLOONS) ×2
BALLN SAPPHIRE ~~LOC~~ 2.75X10 (BALLOONS) ×1 IMPLANT
BALLOON SAPPHIRE 2.0X10 (BALLOONS) IMPLANT
BALLOON SAPPHIRE 2.0X12 (BALLOONS) IMPLANT
CATH INFINITI 5FR MULTPACK ANG (CATHETERS) ×1 IMPLANT
CATH VISTA GUIDE 6FR XB3.5 (CATHETERS) ×1 IMPLANT
COVER DOME SNAP 22 D (MISCELLANEOUS) ×1 IMPLANT
GLIDESHEATH SLEND A-KIT 6F 22G (SHEATH) IMPLANT
GUIDEWIRE INQWIRE 1.5J.035X260 (WIRE) IMPLANT
INQWIRE 1.5J .035X260CM (WIRE) ×2
KIT ENCORE 26 ADVANTAGE (KITS) ×1 IMPLANT
KIT HEART LEFT (KITS) ×2 IMPLANT
PACK CARDIAC CATHETERIZATION (CUSTOM PROCEDURE TRAY) ×2 IMPLANT
SHEATH PINNACLE 6F 10CM (SHEATH) ×1 IMPLANT
STENT SYNERGY DES 2.25X8 (Permanent Stent) ×1 IMPLANT
STENT SYNERGY DES 2.5X12 (Permanent Stent) ×1 IMPLANT
TRANSDUCER W/STOPCOCK (MISCELLANEOUS) ×2 IMPLANT
TUBING CIL FLEX 10 FLL-RA (TUBING) ×2 IMPLANT
WIRE ASAHI PROWATER 180CM (WIRE) ×1 IMPLANT
WIRE EMERALD 3MM-J .035X150CM (WIRE) ×1 IMPLANT
WIRE HI TORQ VERSACORE-J 145CM (WIRE) ×1 IMPLANT

## 2018-09-06 NOTE — ED Notes (Signed)
Nausea is relieved at this time.

## 2018-09-06 NOTE — H&P (Addendum)
CARDIOLOGY H&P  HPI: Samantha Conrad is a 78 y.o. female w/ a history of ESRD on nightly peritoneal dialysis, HTN, NASH, stroke, DM2, thrombocytopenia presenting with chest pain.   Patient at home in usual state of health today. Developed sudden onset central chest pain radiating to back around supper time. Never had anything like this before. No clear exacerbating factors. Called EMS and was given 310m aspirin. Was brought to ED and found to have posterior ST elevation by ECG with continued chest pain.   Review of Systems:     Cardiac Review of Systems: {Y] = yes [ ]  = no  Chest Pain [ Y   ]  Resting SOB [ Y  ] Exertional SOB  [  ]  Orthopnea [  ]   Pedal Edema [   ]    Palpitations [  ] Syncope  [  ]   Presyncope [   ]  General Review of Systems: [Y] = yes [  ]=no Constitional: recent weight change [  ]; anorexia [  ]; fatigue [  ]; nausea [  ]; night sweats [  ]; fever [  ]; or chills [  ];                                                                     Dental: poor dentition[  ];   Eye : blurred vision [  ]; diplopia [   ]; vision changes [  ];  Amaurosis fugax[  ]; Resp: cough [  ];  wheezing[  ];  hemoptysis[  ]; shortness of breath[  ]; paroxysmal nocturnal dyspnea[  ]; dyspnea on exertion[  ]; or orthopnea[  ];  GI:  gallstones[  ], vomiting[  ];  dysphagia[  ]; melena[  ];  hematochezia [  ]; heartburn[  ];   GU: kidney stones [  ]; hematuria[  ];   dysuria [  ];  nocturia[  ];               Skin: rash [  ], swelling[  ];, hair loss[  ];  peripheral edema[  ];  or itching[  ]; Musculosketetal: myalgias[  ];  joint swelling[  ];  joint erythema[  ];  joint pain[  ];  back pain[  ];  Heme/Lymph: bruising[  ];  bleeding[  ];  anemia[  ];  Neuro: TIA[  ];  headaches[  ];  stroke[  ];  vertigo[  ];  seizures[  ];   paresthesias[  ];  difficulty walking[  ];  Psych:depression[  ]; anxiety[  ];  Endocrine: diabetes[  ];  thyroid dysfunction[  ];  Other:  Past Medical History:   Diagnosis Date  . Allergic rhinitis   . Basal cell carcinoma     on nose/face  . Constipation   . Depression   . Diarrhea due to drug   . Environmental and seasonal allergies   . [place]    "q [place]" (11/29/2017)  . Fatty liver   . Headache    none since stroke in Dec. 2017 (11/29/2017)  . History of blood transfusion    "w/hysterectomy; w/kidney failure"  . Hypertension   . Hypothyroidism   .  NASH (nonalcoholic steatohepatitis)   . Seizures ([place])    ? when she may have had a stroke sometime in the spring of 2018  . Sleep apnea    diagnosed years ago "after I'd gained alot of weight;  no longer a problem since weight loss"  . Stroke [facility]) 04/2016   memory issues, lost 1/2 vision in both eyes  . Thrombocytopenia ([place])   . Thyroid disease   . Type II diabetes mellitus ([place])     Prior to Admission medications   Medication Sig Start Date End Date Taking? Authorizing Provider  acetaminophen-codeine (TYLENOL #3) 300-30 MG tablet Take 1-2 tablets by mouth every 6 (six) hours as needed for moderate pain.    [provider]  Amino Acids-Protein Hydrolys (FEEDING SUPPLEMENT, PRO-STAT SUGAR FREE 64,) LIQD Take 30 mLs by mouth 2 (two) times daily. 03/16/17   Doreatha Lew, MD  bisacodyl (DULCOLAX) 10 MG suppository Place 10 mg rectally daily as needed (for constipation.).     [provider]  calcitRIOL (ROCALTROL) 0.25 MCG capsule Take 1 capsule (0.25 mcg total) by mouth daily. Patient not taking: Reported on 06/11/2018 12/01/17   Meccariello, Bernita Raisin, DO  calcium acetate (PHOSLO) 667 MG capsule Take 2 capsules (1,334 mg total) by mouth 3 (three) times daily with meals. Patient not taking: Reported on 06/11/2018 11/30/17   Meccariello, Bernita Raisin, DO  citalopram (CELEXA) 10 MG tablet Take 10 mg by mouth daily. 10/15/17   [provider]  furosemide (LASIX) 40 MG tablet Take 40 mg by mouth 2 (two) times daily.    [provider]   glipiZIDE (GLUCOTROL) 10 MG tablet Take 5 mg by mouth daily.  10/16/17   [provider]  levETIRAcetam (KEPPRA) 250 MG tablet Take 250 mg by mouth every 12 (twelve) hours. With breakfast & with supper.    [provider]  levothyroxine (SYNTHROID, LEVOTHROID) 100 MCG tablet Take 100 mcg by mouth daily before breakfast.  09/02/16   [provider]  linagliptin (TRADJENTA) 5 MG TABS tablet Take 5 mg by mouth daily.    [provider]  losartan (COZAAR) 25 MG tablet Take 25 mg by mouth daily.    [provider]  multivitamin (PROSIGHT) TABS tablet Take 1 tablet by mouth daily. 03/17/17   Doreatha Lew, MD  nitroGLYCERIN (NITROSTAT) 0.4 MG SL tablet Place 0.4 mg under the tongue every 5 (five) minutes as needed for chest pain.    [provider]  Nutritional Supplements (FEEDING SUPPLEMENT, NEPRO CARB STEADY,) LIQD Take 237 mLs by mouth daily. 03/17/17   Doreatha Lew, MD  Pancrelipase, Lip-Prot-Amyl, (CREON) 24000-76000 units CPEP Take 4 capsules by mouth 3 (three) times daily with meals.     [provider]  polyethylene glycol (MIRALAX / GLYCOLAX) packet Take 17 g by mouth daily as needed for mild constipation (for constipation).     [provider]  potassium chloride SA (K-DUR,KLOR-CON) 20 MEQ tablet Take 1 tablet (20 mEq total) by mouth 2 (two) times daily. 11/30/17   Meccariello, Bernita Raisin, DO  prednisoLONE acetate (PRED FORTE) 1 % ophthalmic suspension Place 1 drop into both eyes at bedtime.     [provider]  traZODone (DESYREL) 50 MG tablet Take 50 mg by mouth at bedtime.    [provider]     Allergies  Allergen Reactions  . Contrast Media [Iodinated Diagnostic Agents] Swelling    SWELLING REACTION UNSPECIFIED   . Tape Other (See  Comments)    Ask    Social History   Socioeconomic History  . Marital status: Married    Spouse name: Not on file  . Number of children: Not on file  .  Years of education: Not on file  . Highest education level: Not on file  Occupational History  . Not on file  Social Needs  . Financial resource strain: Not on file  . Food insecurity:    Worry: Not on file    Inability: Not on file  . Transportation needs:    Medical: Not on file    Non-medical: Not on file  Tobacco Use  . Smoking status: Never Smoker  . Smokeless tobacco: Former Systems developer    Types: Snuff  . Tobacco comment: "no snuff since < 1980s"  Substance and Sexual Activity  . Alcohol use: Never    Frequency: Never  . Drug use: Never  . Sexual activity: Not Currently  Lifestyle  . Physical activity:    Days per week: Not on file    Minutes per session: Not on file  . Stress: Not on file  Relationships  . Social connections:    Talks on phone: Not on file    Gets together: Not on file    Attends religious service: Not on file    Active member of club or organization: Not on file    Attends meetings of clubs or organizations: Not on file    Relationship status: Not on file  . Intimate partner violence:    Fear of current or ex partner: Not on file    Emotionally abused: Not on file    Physically abused: Not on file    Forced sexual activity: Not on file  Other Topics Concern  . Not on file  Social History Narrative  . Not on file    No family history on file.  PHYSICAL EXAM: Vitals:   09/06/18 0020 09/06/18 0025  BP: 126/89 128/85  Pulse: (!) 106 (!) 102  Resp: 17 (!) 21  Temp:    SpO2: 96% 98%   General:  Well appearing. No respiratory difficulty HEENT: normal Neck: supple. JVP slightly elevated to ~8cm H2O Cor: PMI nondisplaced. Regular rate & rhythm. 2/6 holosystolic murmur across the precordium. Lungs: clear Abdomen: soft, nontender, nondistended. No hepatosplenomegaly. No bruits or masses. Good bowel sounds. Extremities: no cyanosis, clubbing, rash, edema Neuro: alert & oriented x 3, cranial nerves grossly intact. moves all 4 extremities w/o  difficulty. Affect pleasant.  ECG: NSR w/ 74m ST depression in V2 and V3 and nonspecific ST changes throughout; posterior ECG w/ 241mST elevation in V8, V9  No results found for this or any previous visit (from the past 24 hour(s)). Dg Chest Port 1 View  Result Date: 09/06/2018 CLINICAL DATA:  Chest pain. EXAM: PORTABLE CHEST 1 VIEW COMPARISON:  Frontal and lateral views 12/11/2017 FINDINGS: Low lung volumes.The cardiomediastinal contours are normal. Minimal right lung base atelectasis. Pulmonary vasculature is normal. No consolidation, pleural effusion, or pneumothorax. No acute osseous abnormalities are seen. IMPRESSION: Low lung volumes with minimal right lung base atelectasis. Electronically Signed   By: MeKeith Rake.D.   On: 09/06/2018 00:16   ASSESSMENT: [age].o. female w/ a history of ESRD on nightly peritoneal dialysis, HTN, NASH, stroke, DM2, thrombocytopenia presenting with chest pain found to have posterior STEMI. Now s/p LCx PCI x 2.   PLAN/DISCUSSION: #) STEMI Diagnostics: - echo in AM - check lipids, [employer]: - ASA  81m daily - atorvastatin 895mQHS - carvedilol 3.12585mID - bival to run overnight - cont home losartan - SLN, nitro gtt PRN - ticagrelor 180m8mven in cath lab, will switch to plavix given thrombocytopenia - plavix 300mg58m this AM followed by 75mg 75my starting tomorrow - would benefit from addition of cardioprotective DM medication if possible in setting of renal dysfunction - cardiac rehab  #) ESRD on PD:  - will need to do peritoneal dialysis while in house (missed PD tonight) - renal consulted, no indication to urgently start PD tonight, will plan to resume tomorrow  AnthonMarcie Mowersardiology Fellow, PGY-6

## 2018-09-06 NOTE — Consult Note (Signed)
Referring Provider: No ref. provider found Primary Care Physician:  Physicians, [facility] Primary Nephrologist:  Dr. Justin Mend  Reason for Consultation: Medical management end-stage renal disease, assessment suspected hyperparathyroidism, treatment of anemia, maintenance of euvolemia  HPI: This is a 78 year old lady with end-stage renal disease on peritoneal dialysis she also has a history of diabetes and a prior CVA.  She presented with ST segment elevated MI.  She went emergently to the cardiac catheterization lab and underwent drug-eluting stent placement to the proximal circumflex.  Blood pressure 151/64 pulse 70 temperature 98.7 O2 sats 94% 2 L of oxygen  Aspirin 81 mg daily, atorvastatin 40 mg a carvedilol 3.125 mg twice daily Lasix 40 mg twice daily, Cozaar 25 mg daily, Celexa 10 mg daily trazodone 50 mg nightly calcitriol 0.25 mcg daily, Glucotrol 5 mg daily, levothyroxine 100 mcg daily, Tradjenta 5 mg daily, PhosLo 660 mg 2 tablets 3 times daily with meals, Plavix 75 mg daily, Keppra 250 mg twice daily.  Pro-stat 30 cc twice daily and Nepro 237 cc daily.  Potassium chloride 20 mEq twice daily multivitamins 1 daily  Sodium 130 potassium 4.0 chloride 93 CO2 21 BUN 41 creatinine 5.25 glucose 333 WBC 8.4 hemoglobin 10.8 and platelets 88.      Past Medical History:  Diagnosis Date  . Allergic rhinitis   . Basal cell carcinoma     on nose/face  . Constipation   . Depression   . Diarrhea due to drug   . Environmental and seasonal allergies   . ESRD on peritoneal dialysis ([place])    "q night" (11/29/2017)  . Fatty liver   . Headache    none since stroke in Dec. 2017 (11/29/2017)  . History of blood transfusion    "w/hysterectomy; w/kidney failure"  . Hypertension   . Hypothyroidism   . NASH (nonalcoholic steatohepatitis)   . Seizures ([place])    ? when she may have had a stroke sometime in the spring of 2018  . Sleep apnea    diagnosed years ago "after I'd gained alot of weight;   no longer a problem since weight loss"  . Stroke [facility]) 04/2016   memory issues, lost 1/2 vision in both eyes  . Thrombocytopenia ([place])   . Thyroid disease   . Type II diabetes mellitus ([place])     Past Surgical History:  Procedure Laterality Date  . ABDOMINAL HYSTERECTOMY    . AV FISTULA PLACEMENT Right 10/30/2016   Procedure: INSERTION OF ARTERIOVENOUS (AV) GORE-TEX GRAFT RIGHT UPPER ARM;  Surgeon: Elam Dutch, MD;  Location: [place];  Service: Vascular;  Laterality: Right;  . CATARACT EXTRACTION W/ INTRAOCULAR LENS  IMPLANT, BILATERAL Bilateral   . CHOLECYSTECTOMY OPEN    . CORNEAL TRANSPLANT Bilateral   . CORONARY ANGIOGRAPHY N/A 09/06/2018   Procedure: CORONARY ANGIOGRAPHY;  Surgeon: Lorretta Harp, MD;  Location: [place] [facility];  Service: Cardiovascular;  Laterality: N/A;  . CORONARY/GRAFT ACUTE MI REVASCULARIZATION N/A 09/06/2018   Procedure: Coronary/Graft Acute MI Revascularization;  Surgeon: Lorretta Harp, MD;  Location: [place] [facility];  Service: Cardiovascular;  Laterality: N/A;  . EYE SURGERY    . KNEE ARTHROSCOPY Left   . LEFT HEART CATH AND CORONARY ANGIOGRAPHY N/A 09/06/2018   Procedure: LEFT HEART CATH AND CORONARY ANGIOGRAPHY;  Surgeon: Lorretta Harp, MD;  Location: [place] [facility];  Service: Cardiovascular;  Laterality: N/A;  . MOHS SURGERY     "nose"  . SHOULDER OPEN ROTATOR CUFF REPAIR Left   .  THROMBECTOMY AND REVISION OF ARTERIOVENTOUS (AV) GORETEX  GRAFT Right 12/04/2016   Procedure: THROMBECTOMY/ REVISION OF RIGHT UPPER ARM ARTERIOVENOUS GORETEX GRAFT;  Surgeon: Elam Dutch, MD;  Location: [place];  Service: Vascular;  Laterality: Right;  . TONSILLECTOMY      Prior to Admission medications   Medication Sig Start Date End Date Taking? Authorizing Provider  acetaminophen-codeine (TYLENOL #3) 300-30 MG tablet Take 1-2 tablets by mouth every 6 (six) hours as needed for moderate pain.   Yes [provider]  Amino Acids-Protein  Hydrolys (FEEDING SUPPLEMENT, PRO-STAT SUGAR FREE 64,) LIQD Take 30 mLs by mouth 2 (two) times daily. 03/16/17  Yes Patrecia Pour, Christean Grief, MD  bisacodyl (DULCOLAX) 10 MG suppository Place 10 mg rectally daily as needed (for constipation.).    Yes [provider]  citalopram (CELEXA) 10 MG tablet Take 10 mg by mouth daily. 10/15/17  Yes [provider]  CREON 12000 units CPEP capsule Take 48,000 Units by mouth 3 (three) times daily. 06/27/18  Yes [provider]  furosemide (LASIX) 40 MG tablet Take 40 mg by mouth 2 (two) times daily.   Yes [provider]  glipiZIDE (GLUCOTROL) 5 MG tablet Take 5 mg by mouth daily. 06/25/18  Yes [provider]  lactulose (CHRONULAC) 10 GM/15ML solution Take 15 mLs by mouth as needed for constipation. 09/05/18  Yes [provider]  levETIRAcetam (KEPPRA) 250 MG tablet Take 250 mg by mouth every 12 (twelve) hours. With breakfast & with supper.   Yes [provider]  levothyroxine (SYNTHROID, LEVOTHROID) 100 MCG tablet Take 100 mcg by mouth daily before breakfast.  09/02/16  Yes [provider]  linagliptin (TRADJENTA) 5 MG TABS tablet Take 5 mg by mouth daily.   Yes [provider]  midodrine (PROAMATINE) 5 MG tablet Take 5 mg by mouth daily as needed. For BP lower than 110 07/30/18  Yes [provider]  multivitamin (PROSIGHT) TABS tablet Take 1 tablet by mouth daily. 03/17/17  Yes Patrecia Pour, Christean Grief, MD  nitroGLYCERIN (NITROSTAT) 0.4 MG SL tablet Place 0.4 mg under the tongue every 5 (five) minutes as needed for chest pain.   Yes [provider]  Nutritional Supplements (FEEDING SUPPLEMENT, NEPRO CARB STEADY,) LIQD Take 237 mLs by mouth daily. 03/17/17  Yes Patrecia Pour, Christean Grief, MD  pantoprazole (PROTONIX) 40 MG tablet Take 40 mg by mouth daily. 08/21/18  Yes [provider]  polyethylene glycol (MIRALAX / GLYCOLAX) packet Take 17 g by mouth 2 (two) times daily as needed  for mild constipation (for constipation).    Yes [provider]  potassium chloride SA (K-DUR,KLOR-CON) 20 MEQ tablet Take 1 tablet (20 mEq total) by mouth 2 (two) times daily. 11/30/17  Yes Meccariello, Bernita Raisin, DO  prednisoLONE acetate (PRED FORTE) 1 % ophthalmic suspension Place 1 drop into both eyes at bedtime.    Yes [provider]  sevelamer carbonate (RENVELA) 800 MG tablet Take 2,400 mg by mouth 3 (three) times daily. 07/04/18  Yes [provider]  traZODone (DESYREL) 50 MG tablet Take 50 mg by mouth at bedtime.   Yes [provider]  calcitRIOL (ROCALTROL) 0.25 MCG capsule Take 1 capsule (0.25 mcg total) by mouth daily. Patient not taking: Reported on 06/11/2018 12/01/17   Meccariello, Bernita Raisin, DO  calcium acetate (PHOSLO) 667 MG capsule Take 2 capsules (1,334 mg total) by mouth 3 (three) times daily with meals. Patient not taking: Reported on 06/11/2018 11/30/17   Meccariello, Bernita Raisin,  DO    Current Facility-Administered Medications  Medication Dose Route Frequency Provider Last Rate Last Dose  . 0.9 %  sodium chloride infusion   Intravenous Continuous Lorretta Harp, MD   Stopped at 09/06/18 0245  . 0.9 %  sodium chloride infusion   Intravenous Continuous Lorretta Harp, MD   Stopped at 09/06/18 [phone]  . 0.9 %  sodium chloride infusion  250 mL Intravenous PRN Lorretta Harp, MD 10 mL/hr at 09/06/18 0700    . acetaminophen (TYLENOL) tablet 650 mg  650 mg Oral Q4H PRN Lorretta Harp, MD      . acetaminophen-codeine (TYLENOL #3) 300-30 MG per tablet 1-2 tablet  1-2 tablet Oral Q6H PRN Lorretta Harp, MD      . aspirin chewable tablet 81 mg  81 mg Oral Daily Lorretta Harp, MD      . atorvastatin (LIPITOR) tablet 40 mg  40 mg Oral q1800 Troy Sine, MD      . bisacodyl (DULCOLAX) suppository 10 mg  10 mg Rectal Daily PRN Lorretta Harp, MD      . calcitRIOL (ROCALTROL) capsule 0.25 mcg  0.25 mcg Oral Daily Lorretta Harp, MD   0.25  mcg at 09/06/18 0949  . calcium acetate (PHOSLO) capsule 1,334 mg  1,334 mg Oral TID WC Lorretta Harp, MD   1,334 mg at 09/06/18 0744  . carvedilol (COREG) tablet 3.125 mg  3.125 mg Oral BID WC Carnicelli, Earnstine Regal, MD   3.125 mg at 09/06/18 0744  . Chlorhexidine Gluconate Cloth 2 % PADS 6 each  6 each Topical Daily Lorretta Harp, MD   6 each at 09/06/18 0245  . citalopram (CELEXA) tablet 10 mg  10 mg Oral Daily Lorretta Harp, MD      . clopidogrel (PLAVIX) tablet 300 mg  300 mg Oral Once Carnicelli, Earnstine Regal, MD       Followed by  . [START ON 09/07/2018] clopidogrel (PLAVIX) tablet 75 mg  75 mg Oral Daily Carnicelli, Earnstine Regal, MD      . feeding supplement (NEPRO CARB STEADY) liquid 237 mL  237 mL Oral Q24H Lorretta Harp, MD      . feeding supplement (PRO-STAT SUGAR FREE 64) liquid 30 mL  30 mL Oral BID Lorretta Harp, MD   30 mL at 09/06/18 0956  . furosemide (LASIX) tablet 40 mg  40 mg Oral BID Lorretta Harp, MD   40 mg at 09/06/18 0745  . glipiZIDE (GLUCOTROL) tablet 5 mg  5 mg Oral Q breakfast Lorretta Harp, MD   5 mg at 09/06/18 0745  . levETIRAcetam (KEPPRA) tablet 250 mg  250 mg Oral Q12H Lorretta Harp, MD   250 mg at 09/06/18 0955  . levothyroxine (SYNTHROID, LEVOTHROID) tablet 100 mcg  100 mcg Oral QAC breakfast Lorretta Harp, MD   100 mcg at 09/06/18 [phone]  . linagliptin (TRADJENTA) tablet 5 mg  5 mg Oral Daily Lorretta Harp, MD   5 mg at 09/06/18 0950  . losartan (COZAAR) tablet 25 mg  25 mg Oral Daily Lorretta Harp, MD   25 mg at 09/06/18 0950  . morphine 2 MG/ML injection 2 mg  2 mg Intravenous Q1H PRN Lorretta Harp, MD      . multivitamin (PROSIGHT) tablet 1 tablet  1 tablet Oral Daily Lorretta Harp, MD      . nitroGLYCERIN (NITROSTAT) SL tablet 0.4 mg  0.4 mg Sublingual Q5 min PRN Lorretta Harp, MD   0.4 mg at 09/06/18 0013  . ondansetron (ZOFRAN) injection 4 mg  4 mg Intravenous Q6H PRN Lorretta Harp, MD       . polyethylene glycol (MIRALAX / GLYCOLAX) packet 17 g  17 g Oral Daily PRN Lorretta Harp, MD      . potassium chloride SA (K-DUR,KLOR-CON) CR tablet 20 mEq  20 mEq Oral BID Carnicelli, Earnstine Regal, MD      . prednisoLONE acetate (PRED FORTE) 1 % ophthalmic suspension 1 drop  1 drop Both Eyes QHS Lorretta Harp, MD      . sodium chloride flush (NS) 0.9 % injection 3 mL  3 mL Intravenous Q12H Lorretta Harp, MD      . sodium chloride flush (NS) 0.9 % injection 3 mL  3 mL Intravenous PRN Lorretta Harp, MD      . traZODone (DESYREL) tablet 50 mg  50 mg Oral QHS Lorretta Harp, MD        Allergies as of 09/05/2018 - Review Complete 09/05/2018  Allergen Reaction Noted  . Contrast media [iodinated diagnostic agents] Swelling 10/12/2016  . Tape Other (See Comments) 06/06/2018    History reviewed. No pertinent family history.  Social History   Socioeconomic History  . Marital status: Married    Spouse name: Not on file  . Number of children: Not on file  . Years of education: Not on file  . Highest education level: Not on file  Occupational History  . Not on file  Social Needs  . Financial resource strain: Not on file  . Food insecurity:    Worry: Not on file    Inability: Not on file  . Transportation needs:    Medical: Not on file    Non-medical: Not on file  Tobacco Use  . Smoking status: Never Smoker  . Smokeless tobacco: Former Systems developer    Types: Snuff  . Tobacco comment: "no snuff since < 1980s"  Substance and Sexual Activity  . Alcohol use: Never    Frequency: Never  . Drug use: Never  . Sexual activity: Not Currently  Lifestyle  . Physical activity:    Days per week: Not on file    Minutes per session: Not on file  . Stress: Not on file  Relationships  . Social connections:    Talks on phone: Not on file    Gets together: Not on file    Attends religious service: Not on file    Active member of club or organization: Not on file    Attends  meetings of clubs or organizations: Not on file    Relationship status: Not on file  . Intimate partner violence:    Fear of current or ex partner: Not on file    Emotionally abused: Not on file    Physically abused: Not on file    Forced sexual activity: Not on file  Other Topics Concern  . Not on file  Social History Narrative  . Not on file    Review of Systems: Gen: No fever sweats or chills HEENT: No visual complaints, No history of Retinopathy. Normal external appearance No Epistaxis or Sore throat. No sinusitis.   CV: Status post myocardial infarction with stent placed to left circumflex 09/05/2018 Resp: Denies dyspnea at rest, dyspnea with exercise, cough, sputum, wheezing, coughing up blood, and pleurisy. GI: Denies vomiting blood, jaundice, and fecal incontinence.   Denies  dysphagia or odynophagia. GU : End-stage renal disease on dialysis she currently still makes urine and takes Lasix to augment urine output MS: Denies joint pain, limitation of movement, and swelling, stiffness, low back pain, extremity pain Derm: Denies rash, itching, dry skin, hives, moles, warts, or unhealing ulcers.  Psych: Denies depression, anxiety, memory loss, suicidal ideation, hallucinations, paranoia, and confusion. Heme: History of thrombocytopenia Neuro: History of CVA is having some mild cognitive deficits.  No seizures. No paresthesias.  No weakness. Endocrine history of diabetes.  No Thyroid disease.  No Adrenal disease.  Physical Exam: Vital signs in last 24 hours: Temp:  [96.6 F (35.9 C)-98.7 F (37.1 C)] 98.7 F (37.1 C) (04/10 0800) Pulse Rate:  [44-109] 72 (04/10 0900) Resp:  [0-26] 11 (04/10 0900) BP: (118-163)/(65-95) 129/65 (04/10 0900) SpO2:  [90 %-100 %] 93 % (04/10 0900) Arterial Line BP: (153-174)/(68-81) 159/68 (04/10 0900) Weight:  [57.2 kg-61.5 kg] 61.5 kg (04/10 0245) Last BM Date: 09/06/18 General:   Elderly lady no obvious distress Head:  Normocephalic and  atraumatic. Eyes:  Sclera clear, no icterus.   Conjunctiva pink. Ears:  Normal auditory acuity. Nose:  No deformity, discharge,  or lesions. Mouth:  No deformity or lesions, dentition normal. Neck:  Supple; no masses or thyromegaly. JVP not elevated Lungs:  Clear throughout to auscultation.   No wheezes, crackles, or rhonchi. No acute distress. Heart:  Regular rate and rhythm; no murmurs, clicks, rubs,  or gallops. Abdomen:  Soft, nontender and nondistended. No masses, hepatosplenomegaly or hernias noted. Normal bowel sounds, without guarding, and without rebound.  PD catheter exit site appears clean dry nonscaly crusted not oozing any Msk:  Symmetrical without gross deformities. Normal posture. Extremities:  Without clubbing or edema. Neurologic: History of CVA but grossly normal. Skin:  Intact without significant lesions or rashes.   Intake/Output from previous day: 04/09 0701 - 04/10 0700 In: 380.8 [I.V.:340.8] Out: 200 [Stool:200] Intake/Output this shift: Total I/O In: 240 [P.O.:240] Out: -   Lab Results: Recent Labs    09/05/18 2348 09/06/18 0412  WBC 9.6 8.4  HGB 11.1* 10.8*  HCT 35.2* 34.0*  PLT 94* 88*   BMET Recent Labs    09/05/18 2348 09/06/18 0412  NA 133* 130*  K 3.4* 4.0  CL 94* 93*  CO2 20* 21*  GLUCOSE 346* 333*  BUN 38* 41*  CREATININE 5.42* 5.25*  CALCIUM 8.8* 8.4*   LFT Recent Labs    09/05/18 2348  PROT 5.9*  ALBUMIN 2.4*  AST 37  ALT 19  ALKPHOS 147*  BILITOT 0.4   PT/INR Recent Labs    09/05/18 2348  LABPROT 13.5  INR 1.0   Hepatitis Panel No results for input(s): HEPBSAG, HCVAB, HEPAIGM, HEPBIGM in the last 72 hours.  Studies/Results: Dg Chest Port 1 View  Result Date: 09/06/2018 CLINICAL DATA:  Chest pain. EXAM: PORTABLE CHEST 1 VIEW COMPARISON:  Frontal and lateral views 12/11/2017 FINDINGS: Low lung volumes.The cardiomediastinal contours are normal. Minimal right lung base atelectasis. Pulmonary vasculature is normal.  No consolidation, pleural effusion, or pneumothorax. No acute osseous abnormalities are seen. IMPRESSION: Low lung volumes with minimal right lung base atelectasis. Electronically Signed   By: Keith Rake M.D.   On: 09/06/2018 00:16    Assessment/Plan:  ESRD-and currently on peritoneal dialysis.  She does fulfill's of 2000 cc 1/2-hour dwells.  We shall continue that while she is in-house.  Coronary artery disease status post myocardial infarction stent to left circumflex 09/05/2018 continue aspirin and  statin  Anemia stable no issue at this time  Bones continue binders and vitamin D as per outpatient continue to follow calcium and phosphorus  History of CVA appears to be stable  Depression continues on Celexa and trazodone  History of hypokalemia continue potassium supplementation  Chronic thrombocytopenia stable  Diabetes mellitus as per primary team  Hyperlipidemia continues on statin  Hypothyroidism on replacement therapy last TSH was 03/11/2017 we will recheck   LOS: 0 Sherril Croon @TODAY @10 :27 AM

## 2018-09-06 NOTE — Progress Notes (Signed)
CRITICAL VALUE ALERT  Critical Value:  Troponin >65  Date & Time Notied:  09/06/18 @ 0964  Provider Notified: Cardiology fellow paged  Orders Received/Actions taken: No new orders received.  Sherlie Ban, RN

## 2018-09-06 NOTE — Progress Notes (Signed)
Med history performed and changes were made. I communicated this to Dr. Claiborne Billings. In particular that the patient stated her doctor stopped Losartan. Her BP is good. Dr. Claiborne Billings elected to continue Losartan for now.  Romeo Rabon, PharmD. [place]: [phone]. 09/06/2018,2:02 PM.

## 2018-09-06 NOTE — ED Notes (Signed)
Patient complaining of nausea at this time.

## 2018-09-06 NOTE — ED Notes (Signed)
Patient is fully undressed on the monitors.

## 2018-09-06 NOTE — CV Procedure (Signed)
    6 Fr. Sheath was pulled from the R F/A, and manual pressure was held for 35 min., until hemostasis was obtained. Sterile gauze was applied at the site. R groin area was soft and non tender. R DP was palpable, 2+, before, during and after the sheath pull.   Bed rest started at 1445, and instructions were given to the patient about the limitations .    HR - 70"s NSR with AVB   BP - 141/72   sPO2 - 96 % on R/A

## 2018-09-06 NOTE — Progress Notes (Signed)
Inpatient Diabetes Program Recommendations  AACE/ADA: New Consensus Statement on Inpatient Glycemic Control (2015)  Target Ranges:  Prepandial:   less than 140 mg/dL      Peak postprandial:   less than 180 mg/dL (1-2 hours)      Critically ill patients:  140 - 180 mg/dL   Lab Results  Component Value Date   GLUCAP 93 11/30/2017   HGBA1C 6.8 (H) 03/11/2017    Review of Glycemic Control Results for Samantha Conrad, Samantha Conrad (MRN [id number]) as of 09/06/2018 13:03  Ref. Range 09/05/2018 23:48 09/06/2018 04:12  Glucose Latest Ref Range: 70 - 99 mg/dL 346 (H) 333 (H)   Diabetes history: DM 2/ESRD Outpatient Diabetes medications:  Glucotrol 5 mg daily, Tradgenta 5 mg daily Current orders for Inpatient glycemic control:  None Inpatient Diabetes Program Recommendations:    Please add Novolog sensitive correction q 4 hours.  Thanks,   Adah Perl, RN, BC-ADM Inpatient Diabetes Coordinator Pager [phone] (8a-5p)

## 2018-09-06 NOTE — Progress Notes (Signed)
  Echocardiogram 2D Echocardiogram has been performed.  Samantha Conrad Samantha Conrad 09/06/2018, 11:02 AM

## 2018-09-06 NOTE — Progress Notes (Signed)
Progress Note  Patient Name: Samantha Conrad Date of Encounter: 09/06/2018  Primary Cardiologist: new Dr. Gwenlyn Found  Subjective   No recurrent chest pain since intervention.  Inpatient Medications    Scheduled Meds: . aspirin  81 mg Oral Daily  . calcitRIOL  0.25 mcg Oral Daily  . calcium acetate  1,334 mg Oral TID WC  . carvedilol  3.125 mg Oral BID WC  . Chlorhexidine Gluconate Cloth  6 each Topical Daily  . citalopram  10 mg Oral Daily  . clopidogrel  300 mg Oral Once   Followed by  . [START ON 09/07/2018] clopidogrel  75 mg Oral Daily  . feeding supplement (NEPRO CARB STEADY)  237 mL Oral Q24H  . feeding supplement (PRO-STAT SUGAR FREE 64)  30 mL Oral BID  . furosemide  40 mg Oral BID  . glipiZIDE  5 mg Oral Q breakfast  . levETIRAcetam  250 mg Oral Q12H  . levothyroxine  100 mcg Oral QAC breakfast  . linagliptin  5 mg Oral Daily  . losartan  25 mg Oral Daily  . multivitamin  1 tablet Oral Daily  . potassium chloride SA  20 mEq Oral BID  . prednisoLONE acetate  1 drop Both Eyes QHS  . sodium chloride flush  3 mL Intravenous Q12H  . traZODone  50 mg Oral QHS   Continuous Infusions: . sodium chloride Stopped (09/06/18 0245)  . sodium chloride Stopped (09/06/18 8756)  . sodium chloride 10 mL/hr at 09/06/18 0700   PRN Meds: sodium chloride, acetaminophen, acetaminophen-codeine, bisacodyl, morphine injection, nitroGLYCERIN, ondansetron (ZOFRAN) IV, polyethylene glycol, sodium chloride flush   Vital Signs    Vitals:   09/06/18 0630 09/06/18 0700 09/06/18 0744 09/06/18 0800  BP: 136/67 (!) 153/80 (!) 143/77   Pulse: 72 88 97 72  Resp: 11 20  14   Temp:  (!) 96.6 F (35.9 C)    TempSrc:  Oral    SpO2: 91% 93%    Weight:      Height:        Intake/Output Summary (Last 24 hours) at 09/06/2018 0848 Last data filed at 09/06/2018 0752 Gross per 24 hour  Intake 620.84 ml  Output 200 ml  Net 420.84 ml    I/O since admission: +420  Filed Weights   09/05/18 2323  09/06/18 0245  Weight: 57.2 kg 61.5 kg    Telemetry    Sinus at 72 - Personally Reviewed  ECG    ECG (independently read by me): Sinus rhythm at 81 bpm, first-degree block, early transition.  Physical Exam   BP (!) 143/77   Pulse 72   Temp (!) 96.6 F (35.9 C) (Oral)   Resp 14   Ht 5' 1"  (1.549 m)   Wt 61.5 kg   SpO2 93%   BMI 25.62 kg/m  General: Alert, oriented, no distress.  Skin: normal turgor, no rashes, warm and dry HEENT: Normocephalic, atraumatic. Pupils equal round and reactive to light; sclera anicteric; extraocular muscles intact;  Nose without nasal septal hypertrophy Mouth/Parynx benign;  Neck: No JVD, no carotid bruits; normal carotid upstroke Lungs: clear to ausculatation and percussion; no wheezing or rales Chest wall: without tenderness to palpitation Heart: PMI not displaced, RRR, s1 s2 normal, 1/6 systolic murmur, no diastolic murmur, no rubs, gallops, thrills, or heaves Abdomen: soft, nontender; no hepatosplenomehaly, BS+; abdominal aorta nontender and not dilated by palpation. Back: no CVA tenderness Pulses 2+; right femoral artery sheath in place Musculoskeletal: full range of motion,  normal strength, no joint deformities Extremities: no clubbing cyanosis or edema, Homan's sign negative  Neurologic: grossly nonfocal; Cranial nerves grossly wnl Psychologic: Normal mood and affect   Labs    Chemistry Recent Labs  Lab 09/05/18 2348 09/06/18 0412  NA 133* 130*  K 3.4* 4.0  CL 94* 93*  CO2 20* 21*  GLUCOSE 346* 333*  BUN 38* 41*  CREATININE 5.42* 5.25*  CALCIUM 8.8* 8.4*  PROT 5.9*  --   ALBUMIN 2.4*  --   AST 37  --   ALT 19  --   ALKPHOS 147*  --   BILITOT 0.4  --   GFRNONAA 7* 7*  GFRAA 8* 8*  ANIONGAP 19* 16*     Hematology Recent Labs  Lab 09/05/18 2348 09/06/18 0412  WBC 9.6 8.4  RBC 3.77* 3.68*  HGB 11.1* 10.8*  HCT 35.2* 34.0*  MCV 93.4 92.4  MCH 29.4 29.3  MCHC 31.5 31.8  RDW 14.0 13.9  PLT 94* 88*     Cardiac Enzymes Recent Labs  Lab 09/05/18 2348 09/06/18 0355  TROPONINI 1.27* >65.00*   No results for input(s): TROPIPOC in the last 168 hours.   BNPNo results for input(s): BNP, PROBNP in the last 168 hours.   DDimer No results for input(s): DDIMER in the last 168 hours.   Lipid Panel     Component Value Date/Time   CHOL 160 09/05/2018 2348   TRIG 159 (H) 09/05/2018 2348   HDL 74 09/05/2018 2348   CHOLHDL 2.2 09/05/2018 2348   VLDL 32 09/05/2018 2348   LDLCALC 54 09/05/2018 2348     Radiology    Dg Chest Port 1 View  Result Date: 09/06/2018 CLINICAL DATA:  Chest pain. EXAM: PORTABLE CHEST 1 VIEW COMPARISON:  Frontal and lateral views 12/11/2017 FINDINGS: Low lung volumes.The cardiomediastinal contours are normal. Minimal right lung base atelectasis. Pulmonary vasculature is normal. No consolidation, pleural effusion, or pneumothorax. No acute osseous abnormalities are seen. IMPRESSION: Low lung volumes with minimal right lung base atelectasis. Electronically Signed   By: Keith Rake M.D.   On: 09/06/2018 00:16    Cardiac Studies   EMERGENT CATH/PCI   Ost Cx to Prox Cx lesion is 99% stenosed.  Prox Cx lesion is 100% stenosed.  A drug-eluting stent was successfully placed.  Post intervention, there is a 0% residual stenosis.  A stent was successfully placed.  Post intervention, there is a 50% residual stenosis.    IMPRESSION: Ms. 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  She had a normal LAD and a normal large dominant RCA.  I was able to reestablish flow in the proximal circumflex and OM branch which was a fairly large vessel but got a suboptimal result in the AV groove continuation going into a small PLA branch.  She was pain-free at the end of the case.  It should be noted that the patient was chronically thrombocytopenic with a platelet count in the 50,000  range.  I am going to continue Angiomax for 4 hours full dose.  Troponins will be cycled.  2D echo was ordered.  Careful attention to groin management during sheath removal because of her thrombocytopenia.  The patient left lab in stable condition.  She will need to be on DAPT for at least 12 months uninterrupted.     Intervention    Patient Profile     78 y.o. female with  end-stage renal disease on peritoneal dialysis, diabetes mellitus, prior stroke, thrombocytopenia who developed new onset of severe chest pain around dinnertime on September 05, 2018.  She presented to the emergency room late last evening and STEMI was activated.  She was taken emergently to the catheterization laboratory by Dr. Gwenlyn Found.  Assessment & Plan    1.  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  Initial subtotal occlusion was proximal to a large first marginal branch.  Continues to be decreased flow in the mid distal AV groove circumflex.  The patient received Aggrastat for 6 hours post procedure.  At present she is asymptomatic without recurrent chest pain.  Patient initially was loaded with Brilinta during the initial intervention.  With her history of thrombocytopenia will transition to clopidogrel today.  Troponin greater than 65 this morning post procedure.  Schedule for echo Doppler study to assess LV function today.  2.  Stage renal disease on home peritoneal dialysis:  Nephrology has been notified.  Will need to have peritoneal dialysis set up today since she missed her evening dialysis as result of the acute coronary syndrome presentation.  3.  Thrombcytopenia.  Platelet count July 2019 was 50,000.  Upon presentation last evening platelet count was 94,000, and today is 88,000.  No obvious bleeding.  4.  History of hypertension: Blood pressure currently stable at 129/65.  Patient was on losartan 25 and now also on carvedilol 3.125 mg bid started post cath.    5.  Type 2 diabetes mellitus: Initial glucose 346.  At home patient's medications included glipizide and Tradjenta.  6.  Hyperlipidemia: Target LDL less than 70 initiate atorvastatin.  LFTs on presentation were normal.  Will initiate atorvastatin at 40 mg since initial LDL was 54.   Signed, Troy Sine, MD, [facility] 09/06/2018, 8:48 AM

## 2018-09-06 NOTE — ED Notes (Signed)
Dr Gwenlyn Found at bedside.

## 2018-09-06 NOTE — ED Notes (Signed)
Patient on cardiac monitor

## 2018-09-06 NOTE — ED Notes (Signed)
To cath lab.

## 2018-09-07 LAB — GLUCOSE, CAPILLARY
Glucose-Capillary: 129 mg/dL — ABNORMAL HIGH (ref 70–99)
Glucose-Capillary: 158 mg/dL — ABNORMAL HIGH (ref 70–99)
Glucose-Capillary: 185 mg/dL — ABNORMAL HIGH (ref 70–99)
Glucose-Capillary: 187 mg/dL — ABNORMAL HIGH (ref 70–99)
Glucose-Capillary: 237 mg/dL — ABNORMAL HIGH (ref 70–99)
Glucose-Capillary: 241 mg/dL — ABNORMAL HIGH (ref 70–99)

## 2018-09-07 LAB — HEMOGLOBIN A1C
Hgb A1c MFr Bld: 5.7 % — ABNORMAL HIGH (ref 4.8–5.6)
Mean Plasma Glucose: 117 mg/dL

## 2018-09-07 LAB — TSH: TSH: 3.439 u[IU]/mL (ref 0.350–4.500)

## 2018-09-07 LAB — POCT ACTIVATED CLOTTING TIME: Activated Clotting Time: 428 seconds

## 2018-09-07 MED ORDER — INSULIN ASPART 100 UNIT/ML ~~LOC~~ SOLN
0.0000 [IU] | Freq: Three times a day (TID) | SUBCUTANEOUS | Status: DC
  Administered 2018-09-08 (×2): 2 [IU] via SUBCUTANEOUS

## 2018-09-07 NOTE — Progress Notes (Signed)
Progress Note  Patient Name: Samantha Conrad Date of Encounter: 09/07/2018  Primary Cardiologist: new Samantha Conrad  Subjective   Currently feeling well without chest pain  Inpatient Medications    Scheduled Meds:  aspirin  81 mg Oral Daily   atorvastatin  40 mg Oral q1800   calcitRIOL  0.25 mcg Oral Daily   calcium acetate  1,334 mg Oral TID WC   carvedilol  3.125 mg Oral BID WC   Chlorhexidine Gluconate Cloth  6 each Topical Daily   citalopram  10 mg Oral Daily   clopidogrel  75 mg Oral Daily   feeding supplement (NEPRO CARB STEADY)  237 mL Oral Q24H   feeding supplement (PRO-STAT SUGAR FREE 64)  30 mL Oral BID   furosemide  40 mg Oral BID   gentamicin cream  1 application Topical Daily   glipiZIDE  5 mg Oral Q breakfast   insulin aspart  0-9 Units Subcutaneous Q4H   levETIRAcetam  250 mg Oral Q12H   levothyroxine  100 mcg Oral QAC breakfast   linagliptin  5 mg Oral Daily   losartan  25 mg Oral Daily   multivitamin  1 tablet Oral Daily   potassium chloride SA  20 mEq Oral BID   prednisoLONE acetate  1 drop Both Eyes QHS   sodium chloride flush  3 mL Intravenous Q12H   traZODone  50 mg Oral QHS   Continuous Infusions:  sodium chloride Stopped (09/06/18 0245)   sodium chloride 10 mL/hr at 09/06/18 0700   dialysis solution 2.5% low-MG/low-CA     PRN Meds: sodium chloride, acetaminophen, acetaminophen-codeine, bisacodyl, dianeal solution for CAPD/CCPD with heparin, morphine injection, nitroGLYCERIN, ondansetron (ZOFRAN) IV, polyethylene glycol, sodium chloride flush   Vital Signs    Vitals:   09/07/18 0600 09/07/18 0746 09/07/18 0800 09/07/18 0829  BP: (!) 101/58  112/60 112/60  Pulse: 61  71 71  Resp: 12  15   Temp:  98.2 F (36.8 C)    TempSrc:  Oral    SpO2: 95%  94%   Weight:      Height:        Intake/Output Summary (Last 24 hours) at 09/07/2018 0913 Last data filed at 09/07/2018 0800 Gross per 24 hour  Intake 958 ml  Output  475 ml  Net 483 ml    I/O since admission: +420  Filed Weights   09/06/18 0245 09/06/18 1850 09/07/18 0400  Weight: 61.5 kg 61.9 kg 63.4 kg    Telemetry    Sinus rhythm- Personally Reviewed  ECG    ECG none new  Physical Exam   BP 112/60    Pulse 71    Temp 98.2 F (36.8 C) (Oral)    Resp 15    Ht 5' 1"  (1.549 m)    Wt 63.4 kg    SpO2 94%    BMI 26.41 kg/m  GEN: Well nourished, well developed, in no acute distress  HEENT: normal  Neck: no JVD, carotid bruits, or masses Cardiac: RRR; no murmurs, rubs, or gallops,no edema  Respiratory:  clear to auscultation bilaterally, normal work of breathing GI: soft, nontender, nondistended, + BS MS: no deformity or atrophy  Skin: warm and dry Neuro:  Strength and sensation are intact Psych: euthymic mood, full affect    Labs    Chemistry Recent Labs  Lab 09/05/18 2348 09/06/18 0412  NA 133* 130*  K 3.4* 4.0  CL 94* 93*  CO2 20* 21*  GLUCOSE 346*  333*  BUN 38* 41*  CREATININE 5.42* 5.25*  CALCIUM 8.8* 8.4*  PROT 5.9*  --   ALBUMIN 2.4*  --   AST 37  --   ALT 19  --   ALKPHOS 147*  --   BILITOT 0.4  --   GFRNONAA 7* 7*  GFRAA 8* 8*  ANIONGAP 19* 16*     Hematology Recent Labs  Lab 09/05/18 2348 09/06/18 0412  WBC 9.6 8.4  RBC 3.77* 3.68*  HGB 11.1* 10.8*  HCT 35.2* 34.0*  MCV 93.4 92.4  MCH 29.4 29.3  MCHC 31.5 31.8  RDW 14.0 13.9  PLT 94* 88*    Cardiac Enzymes Recent Labs  Lab 09/05/18 2348 09/06/18 0355 09/06/18 1443  TROPONINI 1.27* >65.00* >65.00*   No results for input(s): TROPIPOC in the last 168 hours.   BNPNo results for input(s): BNP, PROBNP in the last 168 hours.   DDimer No results for input(s): DDIMER in the last 168 hours.   Lipid Panel     Component Value Date/Time   CHOL 160 09/05/2018 2348   TRIG 159 (H) 09/05/2018 2348   HDL 74 09/05/2018 2348   CHOLHDL 2.2 09/05/2018 2348   VLDL 32 09/05/2018 2348   LDLCALC 54 09/05/2018 2348     Radiology    Dg Chest Port 1  View  Result Date: 09/06/2018 CLINICAL DATA:  Chest pain. EXAM: PORTABLE CHEST 1 VIEW COMPARISON:  Frontal and lateral views 12/11/2017 FINDINGS: Low lung volumes.The cardiomediastinal contours are normal. Minimal right lung base atelectasis. Pulmonary vasculature is normal. No consolidation, pleural effusion, or pneumothorax. No acute osseous abnormalities are seen. IMPRESSION: Low lung volumes with minimal right lung base atelectasis. Electronically Signed   By: Keith Rake M.D.   On: 09/06/2018 00:16    Cardiac Studies   EMERGENT CATH/PCI   Ost Cx to Prox Cx lesion is 99% stenosed.  Prox Cx lesion is 100% stenosed.  A drug-eluting stent was successfully placed.  Post intervention, there is a 0% residual stenosis.  A stent was successfully placed.  Post intervention, there is a 50% residual stenosis.    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  She had a normal LAD and a normal large dominant RCA.  I was able to reestablish flow in the proximal circumflex and OM branch which was a fairly large vessel but got a suboptimal result in the AV groove continuation going into a small PLA branch.  She was pain-free at the end of the case.  It should be noted that the patient was chronically thrombocytopenic with a platelet count in the 50,000 range.  I am going to continue Angiomax for 4 hours full dose.  Troponins Samantha Conrad be cycled.  2D echo was ordered.  Careful attention to groin management during sheath removal because of her thrombocytopenia.  The patient left lab in stable condition.  She Samantha Conrad need to be on DAPT for at least 12 months uninterrupted.     Intervention   TTE 09/06/18  1. The left ventricle has low normal systolic function, with an ejection fraction of 50-55%. The cavity size was normal. Left ventricular diastolic Doppler parameters are consistent with  pseudonormalization. Elevated left ventricular end-diastolic  pressure.  2. Distal septal and posterior lateral hypokinesis.  3. The right ventricle has normal systolic function. The cavity was normal. There is no increase in right ventricular wall thickness.  4. Trivial pericardial effusion is present.  5. Mild thickening of the mitral valve leaflet. Mild calcification of the mitral valve leaflet. Mitral valve regurgitation is mild to moderate by color flow Doppler.  6. The aortic valve is tricuspid. Moderate thickening of the aortic valve. Moderate calcification of the aortic valve. Aortic valve regurgitation is mild by color flow Doppler.  7. The aortic root is normal in size and structure.  8. The interatrial septum was not well visualized.  Patient Profile     78 y.o. female with end-stage renal disease on peritoneal dialysis, diabetes mellitus, prior stroke, thrombocytopenia who developed new onset of severe chest pain around dinnertime on September 05, 2018.  She presented to the emergency room late last evening and STEMI was activated.  She was taken emergently to the catheterization laboratory by Samantha Conrad.  Assessment & Plan    1.  Left circumflex STEMI: Currently on aspirin and Plavix.  Is not on Brilinta due to thrombocytopenia.  Echo shows an ejection fraction of 50 to 55%.  Troponin did rise to greater than 65.  We Lavergne Hiltunen continue with current management.    2.  Stage renal disease on home peritoneal dialysis: Nephrology consulted.  3.  Thrombcytopenia.  No obvious signs of bleeding.  Continue aspirin and Plavix.  4.  History of hypertension: Currently on losartan and carvedilol.  Blood pressure has remained stable.  5.  Type 2 diabetes mellitus: Continue patient's home medications for diabetes.  No changes.  Elevated blood glucose was likely due to acute MI.    6.  Hyperlipidemia: Goal LDL less than 70.  Continue atorvastatin.     Signed, Allegra Lai, MD 09/07/2018, 9:13 AM

## 2018-09-07 NOTE — Progress Notes (Signed)
[facility] ROUNDING NOTE   Subjective:   This is a 78 year old lady end-stage renal disease peritoneal dialysis history of diabetes and prior CVA.  She underwent cardiac catheterization with drug-eluting stent to the proximal circumflex 09/05/2018 due to ST segment elevated MI.  Blood pressure 112/60 pulse 71 temperature 98.2 O2 sats 94% room air  Labs from 09/06/2018 sodium 130 potassium 4.0 chloride 93 CO2 21 BUN 41 creatinine 5.25 glucose 333   WBC 8.4 hemoglobin 10.8 platelets 88.  Aspirin 81 mg, atorvastatin 40 mg, Coreg 3.125 mg twice daily, Lasix 40 mg twice daily, losartan 25 mg daily, Celexa 10 mg daily, trazodone 50 mg daily, Calcitrol 0.25 mcg daily, glipizide 5 mg daily, Tradjenta 5 mg daily, levothyroxine 100 mg daily, PhosLo 667 mg 2 tablets 3 times daily, Plavix 75 mg daily.  Please be doing well with peritoneal dialysis.  Stable dry weight.  Did increase slightly from 61.9 to 63.4 kg.  We will continue to monitor.  Chest x-ray 09/05/2018 low lung volumes minimal right lung base atelectasis   Objective:  Vital signs in last 24 hours:  Temp:  [97.5 F (36.4 C)-98.3 F (36.8 C)] 98.2 F (36.8 C) (04/11 0746) Pulse Rate:  [61-84] 71 (04/11 0829) Resp:  [11-18] 15 (04/11 0800) BP: (101-150)/(55-93) 112/60 (04/11 0829) SpO2:  [89 %-99 %] 94 % (04/11 0800) Arterial Line BP: (162-176)/(71-81) 162/73 (04/10 1300) Weight:  [61.9 kg-63.4 kg] 63.4 kg (04/11 0400)  Weight change: 4.747 kg Filed Weights   09/06/18 0245 09/06/18 1850 09/07/18 0400  Weight: 61.5 kg 61.9 kg 63.4 kg    Intake/Output: I/O last 3 completed shifts: In: 1220.8 [P.O.:840; I.V.:340.8; Other:40] Out: 329 [Urine:275; Stool:400]   Intake/Output this shift:  Total I/O In: 8363 [P.O.:358; Other:8005] Out: 22 [JJOAC:1660]  CVS- RRR systolic murmur 3 out of 6 RS- CTA diminished air entry at bases ABD- BS present soft non-distended peritoneal dialysis catheter site clean EXT- no  edema   Basic Metabolic Panel: Recent Labs  Lab 09/05/18 2348 09/06/18 0412  NA 133* 130*  K 3.4* 4.0  CL 94* 93*  CO2 20* 21*  GLUCOSE 346* 333*  BUN 38* 41*  CREATININE 5.42* 5.25*  CALCIUM 8.8* 8.4*  MG 1.6*  --     Liver Function Tests: Recent Labs  Lab 09/05/18 2348  AST 37  ALT 19  ALKPHOS 147*  BILITOT 0.4  PROT 5.9*  ALBUMIN 2.4*   No results for input(s): LIPASE, AMYLASE in the last 168 hours. No results for input(s): AMMONIA in the last 168 hours.  CBC: Recent Labs  Lab 09/05/18 2348 09/06/18 0412  WBC 9.6 8.4  HGB 11.1* 10.8*  HCT 35.2* 34.0*  MCV 93.4 92.4  PLT 94* 88*    Cardiac Enzymes: Recent Labs  Lab 09/05/18 2348 09/06/18 0355 09/06/18 1443  TROPONINI 1.27* >65.00* >65.00*    BNP: Invalid input(s): POCBNP  CBG: Recent Labs  Lab 09/06/18 1655 09/06/18 2030 09/07/18 0004 09/07/18 0349 09/07/18 0743  GLUCAP 300* 275* 237* 241* 129*    Microbiology: Results for orders placed or performed during the hospital encounter of 09/05/18  MRSA PCR Screening     Status: None   Collection Time: 09/06/18  3:31 AM  Result Value Ref Range Status   MRSA by PCR NEGATIVE NEGATIVE Final    Comment:        The GeneXpert MRSA Assay (FDA approved for NASAL specimens only), is one component of a comprehensive MRSA colonization surveillance program. It is  not intended to diagnose MRSA infection nor to guide or monitor treatment for MRSA infections. Performed at [facility], [place] [address] [place], [place] [address]     Coagulation Studies: Recent Labs    09/05/18 2348  LABPROT 13.5  INR 1.0    Urinalysis: No results for input(s): COLORURINE, LABSPEC, PHURINE, GLUCOSEU, HGBUR, BILIRUBINUR, KETONESUR, PROTEINUR, UROBILINOGEN, NITRITE, LEUKOCYTESUR in the last 72 hours.  Invalid input(s): APPERANCEUR    Imaging: Dg Chest Port 1 View  Result Date: 09/06/2018 CLINICAL DATA:  Chest pain. EXAM: PORTABLE CHEST 1 VIEW  COMPARISON:  Frontal and lateral views 12/11/2017 FINDINGS: Low lung volumes.The cardiomediastinal contours are normal. Minimal right lung base atelectasis. Pulmonary vasculature is normal. No consolidation, pleural effusion, or pneumothorax. No acute osseous abnormalities are seen. IMPRESSION: Low lung volumes with minimal right lung base atelectasis. Electronically Signed   By: Keith Rake M.D.   On: 09/06/2018 00:16     Medications:   . sodium chloride Stopped (09/06/18 0245)  . sodium chloride 10 mL/hr at 09/06/18 0700  . dialysis solution 2.5% low-MG/low-CA     . aspirin  81 mg Oral Daily  . atorvastatin  40 mg Oral q1800  . calcitRIOL  0.25 mcg Oral Daily  . calcium acetate  1,334 mg Oral TID WC  . carvedilol  3.125 mg Oral BID WC  . Chlorhexidine Gluconate Cloth  6 each Topical Daily  . citalopram  10 mg Oral Daily  . clopidogrel  75 mg Oral Daily  . feeding supplement (NEPRO CARB STEADY)  237 mL Oral Q24H  . feeding supplement (PRO-STAT SUGAR FREE 64)  30 mL Oral BID  . furosemide  40 mg Oral BID  . gentamicin cream  1 application Topical Daily  . glipiZIDE  5 mg Oral Q breakfast  . insulin aspart  0-9 Units Subcutaneous Q4H  . levETIRAcetam  250 mg Oral Q12H  . levothyroxine  100 mcg Oral QAC breakfast  . linagliptin  5 mg Oral Daily  . losartan  25 mg Oral Daily  . multivitamin  1 tablet Oral Daily  . potassium chloride SA  20 mEq Oral BID  . prednisoLONE acetate  1 drop Both Eyes QHS  . sodium chloride flush  3 mL Intravenous Q12H  . traZODone  50 mg Oral QHS   sodium chloride, acetaminophen, acetaminophen-codeine, bisacodyl, dianeal solution for CAPD/CCPD with heparin, morphine injection, nitroGLYCERIN, ondansetron (ZOFRAN) IV, polyethylene glycol, sodium chloride flush  Assessment/ Plan:   End-stage renal disease currently on dialysis.  Appears to be tolerating prescription.  We will continue 2.5% bags.  Continue to follow blood pressure and volume status  daily  Anemia stable not an issue at this time  Bones continue binders and vitamin D as outpatient continue to follow calcium phosphorus balance  History of CVA stable  Depression continues on Celexa and trazodone  History of hypokalemia continue potassium supplementation appears stable  History of chronic thrombocytopenia stable  Diabetes mellitus per primary team  Hyperlipidemia continues on statin  Hypothyroidism we will check TSH   LOS: 1 Sherril Croon @TODAY @9 :44 AM

## 2018-09-07 NOTE — Progress Notes (Signed)
Patient arived from 2 heart to 4e20, patient vital signs obtained and telemetry applied and CHG done. Patient given call bell. Will monitor patient .Dinh Ayotte, [name] rN

## 2018-09-07 NOTE — Progress Notes (Signed)
Patient given booklet from Cardiac Rehab. Samantha Conrad, [name] rN

## 2018-09-07 NOTE — Progress Notes (Signed)
1250-1330 Pt educated by phone as Cardiac Rehab not educating face to face due to COVID restrictions. Written materials and MI booklet will be given to pt's RN to give to pt. Stressed importance of plavix with stent. Reviewed NTG use, MI restrictions, watching carbs and heart healthy food choices (pt stated not on renal diet), ex guidelines, and CRP2. Referred to [facility] program. Pt stated will not be able to attend unless she can find transportation as she does not drive. Encouraged pt to begin walking for ex with guidelines written. Pt stated she has issues with memory since stroke. Encouraged her to read materials as a reference. Turned down pages in MI booklet of what we reviewed. Pt voiced understanding at this time. Encouraged her to walk with staff as he becomes stronger. She stated today first time up in chair. Graylon Good RN BSN 09/07/2018 1:30 PM

## 2018-09-08 ENCOUNTER — Encounter (HOSPITAL_COMMUNITY): Payer: Self-pay | Admitting: Physician Assistant

## 2018-09-08 DIAGNOSIS — D649 Anemia, unspecified: Secondary | ICD-10-CM | POA: Diagnosis present

## 2018-09-08 DIAGNOSIS — I2510 Atherosclerotic heart disease of native coronary artery without angina pectoris: Secondary | ICD-10-CM | POA: Diagnosis present

## 2018-09-08 DIAGNOSIS — Z9861 Coronary angioplasty status: Secondary | ICD-10-CM | POA: Diagnosis present

## 2018-09-08 DIAGNOSIS — D696 Thrombocytopenia, unspecified: Secondary | ICD-10-CM | POA: Diagnosis present

## 2018-09-08 LAB — RENAL FUNCTION PANEL
Albumin: 1.7 g/dL — ABNORMAL LOW (ref 3.5–5.0)
Anion gap: 11 (ref 5–15)
BUN: 50 mg/dL — ABNORMAL HIGH (ref 8–23)
CO2: 23 mmol/L (ref 22–32)
Calcium: 8.5 mg/dL — ABNORMAL LOW (ref 8.9–10.3)
Chloride: 96 mmol/L — ABNORMAL LOW (ref 98–111)
Creatinine, Ser: 4.5 mg/dL — ABNORMAL HIGH (ref 0.44–1.00)
GFR calc Af Amer: 10 mL/min — ABNORMAL LOW (ref >60)
GFR calc non Af Amer: 9 mL/min — ABNORMAL LOW (ref >60)
Glucose, Bld: 281 mg/dL — ABNORMAL HIGH (ref 70–99)
Phosphorus: 2.3 mg/dL — ABNORMAL LOW (ref 2.5–4.6)
Potassium: 3.9 mmol/L (ref 3.5–5.1)
Sodium: 130 mmol/L — ABNORMAL LOW (ref 135–145)

## 2018-09-08 LAB — CBC
HCT: 30.2 % — ABNORMAL LOW (ref 36.0–46.0)
Hemoglobin: 9.5 g/dL — ABNORMAL LOW (ref 12.0–15.0)
MCH: 28.9 pg (ref 26.0–34.0)
MCHC: 31.5 g/dL (ref 30.0–36.0)
MCV: 91.8 fL (ref 80.0–100.0)
Platelets: 100 10*3/uL — ABNORMAL LOW (ref 150–400)
RBC: 3.29 MIL/uL — ABNORMAL LOW (ref 3.87–5.11)
RDW: 14.4 % (ref 11.5–15.5)
WBC: 10.5 10*3/uL (ref 4.0–10.5)
nRBC: 0 % (ref 0.0–0.2)

## 2018-09-08 LAB — GLUCOSE, CAPILLARY
Glucose-Capillary: 155 mg/dL — ABNORMAL HIGH (ref 70–99)
Glucose-Capillary: 177 mg/dL — ABNORMAL HIGH (ref 70–99)

## 2018-09-08 MED ORDER — LOSARTAN POTASSIUM 25 MG PO TABS: 25.0000 mg | ORAL_TABLET | Freq: Every day | ORAL | 6 refills | Status: DC

## 2018-09-08 MED ORDER — CARVEDILOL 3.125 MG PO TABS: 3.1250 mg | ORAL_TABLET | Freq: Two times a day (BID) | ORAL | 6 refills | Status: DC

## 2018-09-08 MED ORDER — CLOPIDOGREL BISULFATE 75 MG PO TABS: 75.0000 mg | ORAL_TABLET | Freq: Every day | ORAL | 4 refills | Status: DC

## 2018-09-08 MED ORDER — ATORVASTATIN CALCIUM 40 MG PO TABS: 40.0000 mg | ORAL_TABLET | Freq: Every day | ORAL | 4 refills | Status: DC

## 2018-09-08 MED ORDER — ASPIRIN 81 MG PO CHEW: 81.0000 mg | CHEWABLE_TABLET | Freq: Every day | ORAL | Status: DC

## 2018-09-08 NOTE — Discharge Instructions (Signed)
Groin Site Care Refer to this sheet in the next few weeks. These instructions provide you with information on caring for yourself after your procedure. Your caregiver may also give you more specific instructions. Your treatment has been planned according to current medical practices, but problems sometimes occur. Call your caregiver if you have any problems or questions after your procedure. HOME CARE INSTRUCTIONS  You may shower 24 hours after the procedure. Remove the bandage (dressing) and gently wash the site with plain soap and water. Gently pat the site dry.   Do not apply powder or lotion to the site.   Do not sit in a bathtub, swimming pool, or whirlpool for 5 to 7 days.   No bending, squatting, or lifting anything over 10 pounds (4.5 kg) as directed by your caregiver.   Inspect the site at least twice daily.   Do not drive home if you are discharged the same day of the procedure. Have someone else drive you.   You may drive 24 hours after the procedure unless otherwise instructed by your caregiver.  What to expect:  Any bruising will usually fade within 1 to 2 weeks.   Blood that collects in the tissue (hematoma) may be painful to the touch. It should usually decrease in size and tenderness within 1 to 2 weeks.  SEEK IMMEDIATE MEDICAL CARE IF:  You have unusual pain at the groin site or down the affected leg.   You have redness, warmth, swelling, or pain at the groin site.   You have drainage (other than a small amount of blood on the dressing).   You have chills.   You have a fever or persistent symptoms for more than 72 hours.   You have a fever and your symptoms suddenly get worse.   Your leg becomes pale, cool, tingly, or numb.  You have heavy bleeding from the site. Hold pressure on the site. [facility] Kitchen   YOUR CARDIOLOGY TEAM HAS ARRANGED FOR AN E-VISIT FOR YOUR APPOINTMENT - PLEASE REVIEW IMPORTANT INFORMATION BELOW SEVERAL DAYS PRIOR TO YOUR APPOINTMENT  Due to the  recent COVID-19 pandemic, we are transitioning in-person office visits to tele-medicine visits in an effort to decrease unnecessary exposure to our patients and staff. Medicare and most insurances are covering these visits without a copay needed. We also encourage you to sign up for MyChart if you have not already done so. You will need a smartphone if possible. For patients that do not have this, we can still complete the visit using a regular telephone but do prefer a smartphone to enable video when possible. You may have a close family member that lives with you that can help. If possible, we also ask that you have a blood pressure cuff and scale at home to measure your blood pressure, heart rate and weight prior to your scheduled appointment. Patients with clinical needs that need an in-person evaluation and testing will still be able to come to the office if absolutely necessary. If you have any questions, feel free to call our office.    IF YOU HAVE A SMARTPHONE, PLEASE DOWNLOAD THE WEBEX APP TO YOUR SMARTPHONE  - If Apple, go to [employer] and type in WebEx in the search bar. [place] [employer], the blue/green circle. The app is free but as with any other app download, your phone may require you to verify saved payment information or Apple password. You do NOT have to create a WebEx account.  - If Android,  go to [employer] and type in [employer] in the search bar. [place] [employer], the blue/green circle. The app is free but as with any other app download, your phone may require you to verify saved payment information or Android password. You do NOT have to create a WebEx account.  It is very helpful to have this downloaded before your visit.    2-3 DAYS BEFORE YOUR APPOINTMENT  You will receive a telephone call from one of our [place] team members - your caller ID may say "Unknown caller." If this is a video visit, we will confirm that you have been able to download  the WebEx app. We will remind you check your blood pressure, heart rate and weight prior to your scheduled appointment. If you have an Apple Watch or Kardia, please upload any pertinent ECG strips the day before or morning of your appointment to [facility]. Our staff will also make sure you have reviewed the consent and agree to move forward with your scheduled tele-health visit.     THE DAY OF YOUR APPOINTMENT  Approximately 15 minutes prior to your scheduled appointment, you will receive a telephone call from one of [facility] team - your caller ID may say "Unknown caller."  Our staff will confirm medications, vital signs for the day and any symptoms you may be experiencing. Please have this information available prior to the time of visit start. It may also be helpful for you to have a pad of paper and pen handy for any instructions given during your visit. They will also walk you through joining the WebEx smartphone meeting if this is a video visit.    CONSENT FOR TELE-HEALTH VISIT - PLEASE REVIEW  I hereby voluntarily request, consent and authorize [facility] and its employed or contracted physicians, physician assistants, nurse practitioners or other licensed health care professionals (the Practitioner), to provide me with telemedicine health care services (the Services") as deemed necessary by the treating Practitioner. I acknowledge and consent to receive the Services by the Practitioner via telemedicine. I understand that the telemedicine visit will involve communicating with the Practitioner through live audiovisual communication technology and the disclosure of certain medical information by electronic transmission. I acknowledge that I have been given the opportunity to request an in-person assessment or other available alternative prior to the telemedicine visit and am voluntarily participating in the telemedicine visit.  I understand that I have the right to withhold or withdraw my  consent to the use of telemedicine in the course of my care at any time, without affecting my right to future care or treatment, and that the Practitioner or I may terminate the telemedicine visit at any time. I understand that I have the right to inspect all information obtained and/or recorded in the course of the telemedicine visit and may receive copies of available information for a reasonable fee.  I understand that some of the potential risks of receiving the Services via telemedicine include:   Delay or interruption in medical evaluation due to technological equipment failure or disruption;  Information transmitted may not be sufficient (e.g. poor resolution of images) to allow for appropriate medical decision making by the Practitioner; and/or   In rare instances, security protocols could fail, causing a breach of personal health information.  Furthermore, I acknowledge that it is my responsibility to provide information about my medical history, conditions and care that is complete and accurate to the best of my ability. I acknowledge that Practitioner's advice, recommendations,  and/or decision may be based on factors not within their control, such as incomplete or inaccurate data provided by me or distortions of diagnostic images or specimens that may result from electronic transmissions. I understand that the practice of medicine is not an exact science and that Practitioner makes no warranties or guarantees regarding treatment outcomes. I acknowledge that I will receive a copy of this consent concurrently upon execution via email to the email address I last provided but may also request a printed copy by calling the office of [facility].    I understand that my insurance will be billed for this visit.   I have read or had this consent read to me.  I understand the contents of this consent, which adequately explains the benefits and risks of the Services being provided via telemedicine.    I have been provided ample opportunity to ask questions regarding this consent and the Services and have had my questions answered to my satisfaction.  I give my informed consent for the services to be provided through the use of telemedicine in my medical care  By participating in this telemedicine visit I agree to the above.

## 2018-09-08 NOTE — Progress Notes (Signed)
Progress Note  Patient Name: MANNA GOSE Date of Encounter: 09/08/2018  Primary Cardiologist: [facility], Dr Gwenlyn Found  Subjective   No complaints  Inpatient Medications    Scheduled Meds: . aspirin  81 mg Oral Daily  . atorvastatin  40 mg Oral q1800  . calcitRIOL  0.25 mcg Oral Daily  . calcium acetate  1,334 mg Oral TID WC  . carvedilol  3.125 mg Oral BID WC  . Chlorhexidine Gluconate Cloth  6 each Topical Daily  . citalopram  10 mg Oral Daily  . clopidogrel  75 mg Oral Daily  . feeding supplement (NEPRO CARB STEADY)  237 mL Oral Q24H  . feeding supplement (PRO-STAT SUGAR FREE 64)  30 mL Oral BID  . furosemide  40 mg Oral BID  . gentamicin cream  1 application Topical Daily  . glipiZIDE  5 mg Oral Q breakfast  . insulin aspart  0-9 Units Subcutaneous TID AC & HS  . levETIRAcetam  250 mg Oral Q12H  . levothyroxine  100 mcg Oral QAC breakfast  . linagliptin  5 mg Oral Daily  . losartan  25 mg Oral Daily  . multivitamin  1 tablet Oral Daily  . potassium chloride SA  20 mEq Oral BID  . prednisoLONE acetate  1 drop Both Eyes QHS  . sodium chloride flush  3 mL Intravenous Q12H  . traZODone  50 mg Oral QHS   Continuous Infusions: . sodium chloride Stopped (09/06/18 0245)  . sodium chloride 10 mL/hr at 09/06/18 0700  . dialysis solution 2.5% low-MG/low-CA     PRN Meds: sodium chloride, acetaminophen, acetaminophen-codeine, bisacodyl, dianeal solution for CAPD/CCPD with heparin, morphine injection, nitroGLYCERIN, ondansetron (ZOFRAN) IV, polyethylene glycol, sodium chloride flush   Vital Signs    Vitals:   09/08/18 0555 09/08/18 0740 09/08/18 0750 09/08/18 0755  BP: (!) 99/57  (!) 111/55   Pulse: 81 77 79   Resp:  14 17   Temp: 98.5 F (36.9 C) 98.4 F (36.9 C)    TempSrc: Oral Oral    SpO2: 92% 94% 96%   Weight:    61.7 kg  Height:        Intake/Output Summary (Last 24 hours) at 09/08/2018 0858 Last data filed at 09/07/2018 1020 Gross per 24 hour  Intake -   Output 50 ml  Net -50 ml   Last 3 Weights 09/08/2018 09/07/2018 09/07/2018  Weight (lbs) 136 lb 139 lb 15.9 oz 139 lb 12.4 oz  Weight (kg) 61.689 kg 63.5 kg 63.4 kg      Telemetry    SR - Personally Reviewed  ECG    na- Personally Reviewed  Physical Exam   GEN: No acute distress.   Neck: No JVD Cardiac: RRR, no murmurs, rubs, or gallops.  Respiratory: Clear to auscultation bilaterally. GI: Soft, nontender, non-distended  MS: No edema; No deformity. Neuro:  Nonfocal  Psych: Normal affect   Labs    Chemistry Recent Labs  Lab 09/05/18 2348 09/06/18 0412 09/08/18 0309  NA 133* 130* 130*  K 3.4* 4.0 3.9  CL 94* 93* 96*  CO2 20* 21* 23  GLUCOSE 346* 333* 281*  BUN 38* 41* 50*  CREATININE 5.42* 5.25* 4.50*  CALCIUM 8.8* 8.4* 8.5*  PROT 5.9*  --   --   ALBUMIN 2.4*  --  1.7*  AST 37  --   --   ALT 19  --   --   ALKPHOS 147*  --   --  BILITOT 0.4  --   --   GFRNONAA 7* 7* 9*  GFRAA 8* 8* 10*  ANIONGAP 19* 16* 11     Hematology Recent Labs  Lab 09/05/18 2348 09/06/18 0412 09/08/18 0309  WBC 9.6 8.4 10.5  RBC 3.77* 3.68* 3.29*  HGB 11.1* 10.8* 9.5*  HCT 35.2* 34.0* 30.2*  MCV 93.4 92.4 91.8  MCH 29.4 29.3 28.9  MCHC 31.5 31.8 31.5  RDW 14.0 13.9 14.4  PLT 94* 88* 100*    Cardiac Enzymes Recent Labs  Lab 09/05/18 2348 09/06/18 0355 09/06/18 1443  TROPONINI 1.27* >65.00* >65.00*   No results for input(s): TROPIPOC in the last 168 hours.   BNPNo results for input(s): BNP, PROBNP in the last 168 hours.   DDimer No results for input(s): DDIMER in the last 168 hours.   Radiology    No results found.  Cardiac Studies    Patient Profile     78 y.o. female with end-stage renal disease on peritoneal dialysis, diabetes mellitus, prior stroke, thrombocytopenia who developed new onset of severe chest pain around dinnertime on September 05, 2018.  She presented to the emergency room late last evening and STEMI was activated.  She was taken emergently to  the catheterization laboratory by Dr. Gwenlyn Found.  Assessment & Plan    1.  Left circumflex STEMI:  - s/p DES to LCX Currently on aspirin and Plavix.  Is not on Brilinta due to thrombocytopenia.  Echo shows an ejection fraction of 50 to 55%.  Troponin did rise to greater than 65.   - continue medical therapy with asa 81, atorva 40, coreg 3.137m bid, plavix 75, losartan 25  2.  ESRD home peritoneal dialysis: Nephrology consulted.  3.  Thrombcytopenia.  No obvious signs of bleeding.  - mild increase in platelet count, continue DAPT - down trend in Hgb this admit though her previous baselines have been around 9 where she is now, has had some chronic anemia. No overt signs of bleeding.    4.  Type 2 diabetes mellitus: Continue patient's home medications for diabetes.  No changes.  Elevated blood glucose was likely due to acute MI.     Plan for discharge today.    For questions or updates, please contact CS[place]orePlease consult [email or web address] for contact info under        Signed, BCarlyle Dolly MD  09/08/2018, 8:58 AM

## 2018-09-08 NOTE — Progress Notes (Signed)
Patient given discharge instructions medication list and prescriptions sent to personal pharmacy. Follow up appointments information given to patient and patient verbalized understanding. IV and tele dcd. Patient will be transported home with family. Will be taken to exit with wheelchair and nursing staff. Nelda Bucks, [name] RN

## 2018-09-08 NOTE — Progress Notes (Signed)
[facility] ROUNDING NOTE   Subjective:   This is a 78 year old lady end-stage renal disease peritoneal dialysis history of diabetes and prior CVA.  She underwent cardiac catheterization with drug-eluting stent to the proximal circumflex 09/05/2018 due to ST segment elevated MI.  Blood pressure 122/58 pulse 83 temperature 98.4 O2 sats 99% room air weight stable 61.7 this is down from 63.5 Kg   Sodium 130 potassium 3.9 chloride 96 CO2 23 BUN 50 creatinine 4.5 glucose 281 calcium 8.5 phosphorus 2.3 albumin 1.7 WBC 8.5 hemoglobin 9.5 platelets 100  Aspirin 81 mg, atorvastatin 40 mg, Coreg 3.125 mg twice daily, Lasix 40 mg twice daily, losartan 25 mg daily, Celexa 10 mg daily, trazodone 50 mg daily, Calcitrol 0.25 mcg daily, glipizide 5 mg daily, Tradjenta 5 mg daily, levothyroxine 100 mg daily, PhosLo 667 mg 2 tablets 3 times daily, Plavix 75 mg daily.  Please be doing well with peritoneal dialysis.  Stable dry weight.  Did increase slightly from 61.9 to 63.4 kg.  We will continue to monitor.  Chest x-ray 09/05/2018 low lung volumes minimal right lung base atelectasis   Objective:  Vital signs in last 24 hours:  Temp:  [97.6 F (36.4 C)-98.5 F (36.9 C)] 98.4 F (36.9 C) (04/12 0740) Pulse Rate:  [73-82] 81 (04/12 0942) Resp:  [14-20] 20 (04/12 0942) BP: (94-133)/(55-81) 122/58 (04/12 0942) SpO2:  [92 %-99 %] 99 % (04/12 0942) Weight:  [61.7 kg-63.5 kg] 61.7 kg (04/12 0755)  Weight change: 1.6 kg Filed Weights   09/07/18 0400 09/07/18 1721 09/08/18 0755  Weight: 63.4 kg 63.5 kg 61.7 kg    Intake/Output: I/O last 3 completed shifts: In: 0254 [P.O.:358; Other:8005] Out: 2706 [CBJSE:8315; Stool:50]   Intake/Output this shift:  No intake/output data recorded.  CVS- RRR systolic murmur 3 out of 6 RS- CTA diminished air entry at bases ABD- BS present soft non-distended peritoneal dialysis catheter site clean EXT- no edema   Basic Metabolic Panel: Recent Labs  Lab  09/05/18 2348 09/06/18 0412 09/08/18 0309  NA 133* 130* 130*  K 3.4* 4.0 3.9  CL 94* 93* 96*  CO2 20* 21* 23  GLUCOSE 346* 333* 281*  BUN 38* 41* 50*  CREATININE 5.42* 5.25* 4.50*  CALCIUM 8.8* 8.4* 8.5*  MG 1.6*  --   --   PHOS  --   --  2.3*    Liver Function Tests: Recent Labs  Lab 09/05/18 2348 09/08/18 0309  AST 37  --   ALT 19  --   ALKPHOS 147*  --   BILITOT 0.4  --   PROT 5.9*  --   ALBUMIN 2.4* 1.7*   No results for input(s): LIPASE, AMYLASE in the last 168 hours. No results for input(s): AMMONIA in the last 168 hours.  CBC: Recent Labs  Lab 09/05/18 2348 09/06/18 0412 09/08/18 0309  WBC 9.6 8.4 10.5  HGB 11.1* 10.8* 9.5*  HCT 35.2* 34.0* 30.2*  MCV 93.4 92.4 91.8  PLT 94* 88* 100*    Cardiac Enzymes: Recent Labs  Lab 09/05/18 2348 09/06/18 0355 09/06/18 1443  TROPONINI 1.27* >65.00* >65.00*    BNP: Invalid input(s): POCBNP  CBG: Recent Labs  Lab 09/07/18 0743 09/07/18 1217 09/07/18 1649 09/07/18 2119 09/08/18 0624  GLUCAP 129* 185* 158* 187* 155*    Microbiology: Results for orders placed or performed during the hospital encounter of 09/05/18  MRSA PCR Screening     Status: None   Collection Time: 09/06/18  3:31 AM  Result  Value Ref Range Status   MRSA by PCR NEGATIVE NEGATIVE Final    Comment:        The GeneXpert MRSA Assay (FDA approved for NASAL specimens only), is one component of a comprehensive MRSA colonization surveillance program. It is not intended to diagnose MRSA infection nor to guide or monitor treatment for MRSA infections. Performed at [facility], [place] [address] [place], [place] [address]     Coagulation Studies: Recent Labs    09/05/18 2348  LABPROT 13.5  INR 1.0    Urinalysis: No results for input(s): COLORURINE, LABSPEC, PHURINE, GLUCOSEU, HGBUR, BILIRUBINUR, KETONESUR, PROTEINUR, UROBILINOGEN, NITRITE, LEUKOCYTESUR in the last 72 hours.  Invalid input(s): APPERANCEUR     Imaging: No results found.   Medications:   . sodium chloride Stopped (09/06/18 0245)  . sodium chloride 10 mL/hr at 09/06/18 0700  . dialysis solution 2.5% low-MG/low-CA     . aspirin  81 mg Oral Daily  . atorvastatin  40 mg Oral q1800  . calcitRIOL  0.25 mcg Oral Daily  . calcium acetate  1,334 mg Oral TID WC  . carvedilol  3.125 mg Oral BID WC  . Chlorhexidine Gluconate Cloth  6 each Topical Daily  . citalopram  10 mg Oral Daily  . clopidogrel  75 mg Oral Daily  . feeding supplement (NEPRO CARB STEADY)  237 mL Oral Q24H  . feeding supplement (PRO-STAT SUGAR FREE 64)  30 mL Oral BID  . furosemide  40 mg Oral BID  . gentamicin cream  1 application Topical Daily  . glipiZIDE  5 mg Oral Q breakfast  . insulin aspart  0-9 Units Subcutaneous TID AC & HS  . levETIRAcetam  250 mg Oral Q12H  . levothyroxine  100 mcg Oral QAC breakfast  . linagliptin  5 mg Oral Daily  . losartan  25 mg Oral Daily  . multivitamin  1 tablet Oral Daily  . potassium chloride SA  20 mEq Oral BID  . prednisoLONE acetate  1 drop Both Eyes QHS  . sodium chloride flush  3 mL Intravenous Q12H  . traZODone  50 mg Oral QHS   sodium chloride, acetaminophen, acetaminophen-codeine, bisacodyl, dianeal solution for CAPD/CCPD with heparin, morphine injection, nitroGLYCERIN, ondansetron (ZOFRAN) IV, polyethylene glycol, sodium chloride flush  Assessment/ Plan:   End-stage renal disease currently on dialysis.  Appears to be tolerating prescription.  We will continue 2.5% bags.  Continue to follow blood pressure and volume status daily  Anemia stable not an issue at this time  Bones continue binders and vitamin D as outpatient continue to follow calcium phosphorus balance  History of CVA stable  Depression continues on Celexa and trazodone  History of hypokalemia continue potassium supplementation appears stable  History of chronic thrombocytopenia stable  Diabetes mellitus per primary  team  Hyperlipidemia continues on statin  Hypothyroidism TSH 3.439  Disposition patient stable for discharge   LOS: 2 Sherril Croon @TODAY @9 :57 AM

## 2018-09-08 NOTE — Discharge Summary (Addendum)
Discharge Summary    Patient ID: Samantha Conrad MRN: [id number]; DOB: [birth year]  Admit date: 09/05/2018 Discharge date: 09/08/2018  Primary Care Provider: [facility]  Primary Cardiologist: Dr. Gwenlyn Found    Discharge Diagnoses    Principal Problem:   Acute ST elevation myocardial infarction (STEMI) of posterior wall (HCC) Active Problems:   Cirrhosis ([place])   Diet-controlled diabetes mellitus ([place])   NASH (nonalcoholic steatohepatitis)   Hypertension   Hypothyroidism   ESRD on dialysis ([place])   Seizure disorder   HLD (hyperlipidemia)   CAD (coronary artery disease)   Anemia   Thrombocytopenia (HCC)   Allergies Allergies  Allergen Reactions   Contrast Media [Iodinated Diagnostic Agents] Swelling    SWELLING REACTION UNSPECIFIED    Tape Other (See Comments)    Ask    Diagnostic Studies/Procedures    09/06/18 Conclusion     Ost Cx to Prox Cx lesion is 99% stenosed.  Prox Cx lesion is 100% stenosed.  A drug-eluting stent was successfully placed.  Post intervention, there is a 0% residual stenosis.  A stent was successfully placed.  Post intervention, there is a 50% residual stenosis.   Samantha Conrad is a 78 y.o. female    [id number] LOCATION:  FACILITY: [facility]  PHYSICIAN: Quay Burow, M.D. 02-20-1941   DATE OF PROCEDURE:  09/06/2018  DATE OF DISCHARGE:    CARDIAC CATHETERIZATION / PCI DES LCX  History obtained from chart review.  Ms. Plitt is a 78 year old thin appearing widowed Caucasian female without prior cardiac history.  She does have a history of chronic renal failure on peritoneal dialysis, diabetes, cirrhosis with NASH, hypertension.  She developed chest pain this afternoon and presented to [facility] emergency room.  She was noted to have a posterior MI with ST segment elevation in the posterior leads and reciprocal inferior depression.  She did have some AI VR and some nonsustained ventricular tachycardia with  frequent PVCs although she remained hemodynamically stable.  Her pain improved with sublingual nitroglycerin.  She was evaluated in the emergency room and was brought to the Cath Lab urgently for catheterization and potential intervention.  Because of COVID-19 and the need to screen her in the ER there was a delay in bring her to the Cath Lab.   _____________  Echo 09/06/18 IMPRESSIONS  1. The left ventricle has low normal systolic function, with an ejection fraction of 50-55%. The cavity size was normal. Left ventricular diastolic Doppler parameters are consistent with pseudonormalization. Elevated left ventricular end-diastolic  pressure.  2. Distal septal and posterior lateral hypokinesis.  3. The right ventricle has normal systolic function. The cavity was normal. There is no increase in right ventricular wall thickness.  4. Trivial pericardial effusion is present.  5. Mild thickening of the mitral valve leaflet. Mild calcification of the mitral valve leaflet. Mitral valve regurgitation is mild to moderate by color flow Doppler.  6. The aortic valve is tricuspid. Moderate thickening of the aortic valve. Moderate calcification of the aortic valve. Aortic valve regurgitation is mild by color flow Doppler.  7. The aortic root is normal in size and structure.  8. The interatrial septum was not well visualized.  FINDINGS  Left Ventricle: The left ventricle has low normal systolic function, with an ejection fraction of 50-55%. The cavity size was normal. There is no increase in left ventricular wall thickness. Left ventricular diastolic Doppler parameters are consistent  with pseudonormalization. Elevated left ventricular end-diastolic pressure Distal septal and posterior lateral hypokinesis. Right  Ventricle: The right ventricle has normal systolic function. The cavity was normal. There is no increase in right ventricular wall thickness. Left Atrium: Left atrial size was normal in size. Right  Atrium: Right atrial size was normal in size. Right atrial pressure is estimated at 3 mmHg. Interatrial Septum: The interatrial septum was not well visualized. Pericardium: Trivial pericardial effusion is present. Mitral Valve: The mitral valve is normal in structure. Mild thickening of the mitral valve leaflet. Mild calcification of the mitral valve leaflet. Mitral valve regurgitation is mild to moderate by color flow Doppler. Tricuspid Valve: The tricuspid valve is normal in structure. Tricuspid valve regurgitation is mild by color flow Doppler. Aortic Valve: The aortic valve is tricuspid Moderate thickening of the aortic valve. Moderate calcification of the aortic valve. Aortic valve regurgitation is mild by color flow Doppler. There is no evidence of aortic valve stenosis. Pulmonic Valve: The pulmonic valve was grossly normal. Pulmonic valve regurgitation is mild by color flow Doppler. Aorta: The aortic root is normal in size and structure.      History of Present Illness     Samantha Conrad is a 78 y.o. female with a history of ESRD on peritoneal dialysis, DMT2, HTN, NASH, seizures, prior CVA and thrombocytopenia who presented to [facility] on 09/05/18 with chest pain and found to have acute STEMI.  She was in her USOH until 09/05/18 at dinner time when she developed sudden onset of central chest pain radiating to her back. EMS was called and given 324 mg ASA. She was brought to [facility] ED and found to have posterior ST elevation by ECG with continued chest pain. She was taken emergently to cath lab for acute STEMI.    Hospital Course     Consultants: nephrology  1.Left circumflex STEMI:  - s/p DES to LCX. Placed on aspirin and Plavix.  Brilinta discontinued due to thrombocytopenia. Echo shows an ejection fraction of 50 to 55%. Troponin did rise to greater than 65.  - continue medical therapy with asa 81, atorva 40, coreg 3.133m bid, plavix 75, losartan 25  2. ESRD home peritoneal  dialysis:Nephrology consulted.  3. Thrombcytopenia. No obvious signs of bleeding.  - mild increase in platelet count ~100. Continue DAPT  4. Anemia: down trend in Hgb this admit though her previous baselines have been around 9 where she is now, has had some chronic anemia. No overt signs of bleeding.   5. Type 2 diabetes mellitus: Continue patient's home medications for diabetes. No changes. Elevated blood glucose was likely due to acute MI. Not a candidate for metformin or jardiance given ESRD. _____________  Discharge Vitals Blood pressure (!) 122/58, pulse 81, temperature 98.4 F (36.9 C), temperature source Oral, resp. rate 20, height 5' 1"  (1.549 m), weight 61.7 kg, SpO2 99 %.  Filed Weights   09/07/18 0400 09/07/18 1721 09/08/18 0755  Weight: 63.4 kg 63.5 kg 61.7 kg    Labs & Radiologic Studies    CBC Recent Labs    09/06/18 0412 09/08/18 0309  WBC 8.4 10.5  HGB 10.8* 9.5*  HCT 34.0* 30.2*  MCV 92.4 91.8  PLT 88* 1458   Basic Metabolic Panel Recent Labs    09/05/18 2348 09/06/18 0412 09/08/18 0309  NA 133* 130* 130*  K 3.4* 4.0 3.9  CL 94* 93* 96*  CO2 20* 21* 23  GLUCOSE 346* 333* 281*  BUN 38* 41* 50*  CREATININE 5.42* 5.25* 4.50*  CALCIUM 8.8* 8.4* 8.5*  MG 1.6*  --   --  PHOS  --   --  2.3*   Liver Function Tests Recent Labs    09/05/18 2348 09/08/18 0309  AST 37  --   ALT 19  --   ALKPHOS 147*  --   BILITOT 0.4  --   PROT 5.9*  --   ALBUMIN 2.4* 1.7*   No results for input(s): LIPASE, AMYLASE in the last 72 hours. Cardiac Enzymes Recent Labs    09/05/18 2348 09/06/18 0355 09/06/18 1443  TROPONINI 1.27* >65.00* >65.00*   BNP Invalid input(s): POCBNP D-Dimer No results for input(s): DDIMER in the last 72 hours. Hemoglobin A1C Recent Labs    09/06/18 0412  HGBA1C 5.7*   Fasting Lipid Panel Recent Labs    09/05/18 2348  CHOL 160  HDL 74  LDLCALC 54  TRIG 159*  CHOLHDL 2.2   Thyroid Function Tests Recent Labs     09/07/18 1021  TSH 3.439   _____________  Dg Chest Port 1 View  Result Date: 09/06/2018 CLINICAL DATA:  Chest pain. EXAM: PORTABLE CHEST 1 VIEW COMPARISON:  Frontal and lateral views 12/11/2017 FINDINGS: Low lung volumes.The cardiomediastinal contours are normal. Minimal right lung base atelectasis. Pulmonary vasculature is normal. No consolidation, pleural effusion, or pneumothorax. No acute osseous abnormalities are seen. IMPRESSION: Low lung volumes with minimal right lung base atelectasis. Electronically Signed   By: Keith Rake M.D.   On: 09/06/2018 00:16   Disposition   Pt is being discharged home today in good condition.  Follow-up Plans & Appointments     Discharge Instructions    Amb Referral to Cardiac Rehabilitation   Complete by:  As directed    Referring to [place] [facility]   Diagnosis:   STEMI Coronary Stents        Discharge Medications   Allergies as of 09/08/2018      Reactions   Contrast Media [iodinated Diagnostic Agents] Swelling   SWELLING REACTION UNSPECIFIED    Tape Other (See Comments)   Ask      Medication List    TAKE these medications   acetaminophen-codeine 300-30 MG tablet Commonly known as:  TYLENOL #3 Take 1-2 tablets by mouth every 6 (six) hours as needed for moderate pain.   aspirin 81 MG chewable tablet Chew 1 tablet (81 mg total) by mouth daily.   atorvastatin 40 MG tablet Commonly known as:  LIPITOR Take 1 tablet (40 mg total) by mouth daily at 6 PM.   bisacodyl 10 MG suppository Commonly known as:  DULCOLAX Place 10 mg rectally daily as needed (for constipation.).   calcitRIOL 0.25 MCG capsule Commonly known as:  ROCALTROL Take 1 capsule (0.25 mcg total) by mouth daily.   calcium acetate 667 MG capsule Commonly known as:  PHOSLO Take 2 capsules (1,334 mg total) by mouth 3 (three) times daily with meals.   carvedilol 3.125 MG tablet Commonly known as:  COREG Take 1 tablet (3.125 mg total) by mouth 2 (two) times  daily with a meal.   citalopram 10 MG tablet Commonly known as:  CELEXA Take 10 mg by mouth daily.   clopidogrel 75 MG tablet Commonly known as:  PLAVIX Take 1 tablet (75 mg total) by mouth daily.   Creon 12000 units Cpep capsule Generic drug:  lipase/protease/amylase Take 48,000 Units by mouth 3 (three) times daily.   feeding supplement (NEPRO CARB STEADY) Liqd Take 237 mLs by mouth daily.   feeding supplement (PRO-STAT SUGAR FREE 64) Liqd Take 30 mLs by mouth 2 (two) times  daily.   furosemide 40 MG tablet Commonly known as:  LASIX Take 40 mg by mouth 2 (two) times daily.   glipiZIDE 5 MG tablet Commonly known as:  GLUCOTROL Take 5 mg by mouth daily.   lactulose 10 GM/15ML solution Commonly known as:  CHRONULAC Take 15 mLs by mouth as needed for constipation.   levETIRAcetam 250 MG tablet Commonly known as:  KEPPRA Take 250 mg by mouth every 12 (twelve) hours. With breakfast & with supper.   levothyroxine 100 MCG tablet Commonly known as:  SYNTHROID, LEVOTHROID Take 100 mcg by mouth daily before breakfast.   linagliptin 5 MG Tabs tablet Commonly known as:  TRADJENTA Take 5 mg by mouth daily.   losartan 25 MG tablet Commonly known as:  COZAAR Take 1 tablet (25 mg total) by mouth daily.   midodrine 5 MG tablet Commonly known as:  PROAMATINE Take 5 mg by mouth daily as needed. For BP lower than 110   multivitamin Tabs tablet Take 1 tablet by mouth daily.   nitroGLYCERIN 0.4 MG SL tablet Commonly known as:  NITROSTAT Place 0.4 mg under the tongue every 5 (five) minutes as needed for chest pain.   pantoprazole 40 MG tablet Commonly known as:  PROTONIX Take 40 mg by mouth daily.   polyethylene glycol 17 g packet Commonly known as:  MIRALAX / GLYCOLAX Take 17 g by mouth 2 (two) times daily as needed for mild constipation (for constipation).   potassium chloride SA 20 MEQ tablet Commonly known as:  K-DUR,KLOR-CON Take 1 tablet (20 mEq total) by mouth 2  (two) times daily.   prednisoLONE acetate 1 % ophthalmic suspension Commonly known as:  PRED FORTE Place 1 drop into both eyes at bedtime.   sevelamer carbonate 800 MG tablet Commonly known as:  RENVELA Take 2,400 mg by mouth 3 (three) times daily.   traZODone 50 MG tablet Commonly known as:  DESYREL Take 50 mg by mouth at bedtime.        Acute coronary syndrome (MI, NSTEMI, STEMI, etc) this admission?: Yes.     AHA/ACC Clinical Performance & Quality Measures: 1. Aspirin prescribed? - Yes 2. ADP Receptor Inhibitor (Plavix/Clopidogrel, Brilinta/Ticagrelor or Effient/Prasugrel) prescribed (includes medically managed patients)? - Yes 3. Beta Blocker prescribed? - Yes 4. High Intensity Statin (Lipitor 40-44m or Crestor 20-45m prescribed? - Yes 5. EF assessed during THIS hospitalization? - Yes 6. For EF <40%, was ACEI/ARB prescribed? - Yes  7. For EF <40%, Aldosterone Antagonist (Spironolactone or Eplerenone) prescribed? - Not Applicable (EF >/= 4037%8. Cardiac Rehab Phase II ordered (Included Medically managed Patients)? - Yes     Outstanding Labs/Studies   none  Duration of Discharge Encounter   Greater than 30 minutes including physician time.  Signed, KaAngelena FormPA-C 09/08/2018, 9:59 AM

## 2018-09-12 ENCOUNTER — Telehealth: Payer: Self-pay | Admitting: Cardiovascular Disease

## 2018-09-12 NOTE — Telephone Encounter (Signed)
Pt's son aware of recommendations and agrees with plan Pt has appt tom with PMD and if S/S worsen before will go to ED for eval ./cy

## 2018-09-12 NOTE — Telephone Encounter (Signed)
Pt had two stents put in by Dr. Gwenlyn Found last week. Family of pt has not been informed of pt's progress, other than to inform the pt that the procedure was done (no f/u since)  Pt currently has a low grade fever (100) temp normal runs low (97) bp ok Incision didn't look red, just bruised 8-10# weight gain (family thinking its's fluid) upon discharge from hospital. Has had stomach pain before procedure, and it has continued since she has been discharged, and today it was 10/10 on the pain scale. Pt is nauseous but has not vomited yet.  Family called PCP and described symptoms, and PCP told family to get in touch with the surgeon.  Family wants to know what the next step in treatment should be, whether it's an office visit, or taking the pt to the ED. No improvement since d/c from hospital

## 2018-09-12 NOTE — Telephone Encounter (Signed)
Needs to be seen by her PCP for eval. Doesn't sound cardiac

## 2018-09-12 NOTE — Telephone Encounter (Signed)
Spoke with pt's son as pt gave verbal permission to do so Pt has declined since discharge per son low grade temp(100.)normally 97 B/P137/69 and HR 87 dry cough ,stomach pain (10 on pain scale) has vomited liquid 1 time also 8-10 lb weight gain Will forward to Dr Gwenlyn Found for review and recommendations. Please call son with any recommendations also instructed if worsens to go to ED Verbalize understanding.

## 2018-09-12 NOTE — Telephone Encounter (Signed)
Follow Up   Patients daughter is calling back  Please call [facility] number.  [phone]

## 2018-09-12 NOTE — Telephone Encounter (Signed)
Lm to call back ./cy 

## 2018-09-13 ENCOUNTER — Inpatient Hospital Stay (HOSPITAL_COMMUNITY)
Admission: EM | Admit: 2018-09-13 | Discharge: 2018-09-17 | DRG: 371 | Disposition: A | Discharge status: Home Health | Payer: Medicare Other | Attending: Family Medicine | Admitting: Family Medicine

## 2018-09-13 ENCOUNTER — Other Ambulatory Visit: Payer: Self-pay

## 2018-09-13 ENCOUNTER — Emergency Department (HOSPITAL_COMMUNITY): Payer: Medicare Other

## 2018-09-13 ENCOUNTER — Encounter (HOSPITAL_COMMUNITY): Payer: Self-pay | Admitting: Emergency Medicine

## 2018-09-13 DIAGNOSIS — G4700 Insomnia, unspecified: Secondary | ICD-10-CM | POA: Diagnosis present

## 2018-09-13 DIAGNOSIS — E039 Hypothyroidism, unspecified: Secondary | ICD-10-CM | POA: Diagnosis present

## 2018-09-13 DIAGNOSIS — Z961 Presence of intraocular lens: Secondary | ICD-10-CM | POA: Diagnosis present

## 2018-09-13 DIAGNOSIS — K652 Spontaneous bacterial peritonitis: Principal | ICD-10-CM | POA: Diagnosis present

## 2018-09-13 DIAGNOSIS — K861 Other chronic pancreatitis: Secondary | ICD-10-CM | POA: Diagnosis present

## 2018-09-13 DIAGNOSIS — E785 Hyperlipidemia, unspecified: Secondary | ICD-10-CM | POA: Diagnosis present

## 2018-09-13 DIAGNOSIS — K7581 Nonalcoholic steatohepatitis (NASH): Secondary | ICD-10-CM | POA: Diagnosis present

## 2018-09-13 DIAGNOSIS — F329 Major depressive disorder, single episode, unspecified: Secondary | ICD-10-CM | POA: Diagnosis present

## 2018-09-13 DIAGNOSIS — Z79899 Other long term (current) drug therapy: Secondary | ICD-10-CM

## 2018-09-13 DIAGNOSIS — Z9842 Cataract extraction status, left eye: Secondary | ICD-10-CM

## 2018-09-13 DIAGNOSIS — Z9861 Coronary angioplasty status: Secondary | ICD-10-CM | POA: Diagnosis present

## 2018-09-13 DIAGNOSIS — K279 Peptic ulcer, site unspecified, unspecified as acute or chronic, without hemorrhage or perforation: Secondary | ICD-10-CM | POA: Diagnosis present

## 2018-09-13 DIAGNOSIS — Z7952 Long term (current) use of systemic steroids: Secondary | ICD-10-CM

## 2018-09-13 DIAGNOSIS — I953 Hypotension of hemodialysis: Secondary | ICD-10-CM | POA: Diagnosis present

## 2018-09-13 DIAGNOSIS — Z8673 Personal history of transient ischemic attack (TIA), and cerebral infarction without residual deficits: Secondary | ICD-10-CM

## 2018-09-13 DIAGNOSIS — N2581 Secondary hyperparathyroidism of renal origin: Secondary | ICD-10-CM | POA: Diagnosis present

## 2018-09-13 DIAGNOSIS — K219 Gastro-esophageal reflux disease without esophagitis: Secondary | ICD-10-CM | POA: Diagnosis present

## 2018-09-13 DIAGNOSIS — K7460 Unspecified cirrhosis of liver: Secondary | ICD-10-CM | POA: Diagnosis present

## 2018-09-13 DIAGNOSIS — K659 Peritonitis, unspecified: Secondary | ICD-10-CM

## 2018-09-13 DIAGNOSIS — D649 Anemia, unspecified: Secondary | ICD-10-CM | POA: Diagnosis present

## 2018-09-13 DIAGNOSIS — Z85828 Personal history of other malignant neoplasm of skin: Secondary | ICD-10-CM

## 2018-09-13 DIAGNOSIS — K5900 Constipation, unspecified: Secondary | ICD-10-CM | POA: Diagnosis not present

## 2018-09-13 DIAGNOSIS — Z91048 Other nonmedicinal substance allergy status: Secondary | ICD-10-CM

## 2018-09-13 DIAGNOSIS — G40909 Epilepsy, unspecified, not intractable, without status epilepticus: Secondary | ICD-10-CM | POA: Diagnosis present

## 2018-09-13 DIAGNOSIS — D696 Thrombocytopenia, unspecified: Secondary | ICD-10-CM | POA: Diagnosis present

## 2018-09-13 DIAGNOSIS — I252 Old myocardial infarction: Secondary | ICD-10-CM | POA: Diagnosis present

## 2018-09-13 DIAGNOSIS — Z992 Dependence on renal dialysis: Secondary | ICD-10-CM

## 2018-09-13 DIAGNOSIS — I10 Essential (primary) hypertension: Secondary | ICD-10-CM | POA: Diagnosis present

## 2018-09-13 DIAGNOSIS — G4730 Sleep apnea, unspecified: Secondary | ICD-10-CM | POA: Diagnosis present

## 2018-09-13 DIAGNOSIS — E1122 Type 2 diabetes mellitus with diabetic chronic kidney disease: Secondary | ICD-10-CM | POA: Diagnosis present

## 2018-09-13 DIAGNOSIS — F0390 Unspecified dementia without behavioral disturbance: Secondary | ICD-10-CM | POA: Diagnosis present

## 2018-09-13 DIAGNOSIS — E119 Type 2 diabetes mellitus without complications: Secondary | ICD-10-CM

## 2018-09-13 DIAGNOSIS — F419 Anxiety disorder, unspecified: Secondary | ICD-10-CM | POA: Diagnosis present

## 2018-09-13 DIAGNOSIS — I120 Hypertensive chronic kidney disease with stage 5 chronic kidney disease or end stage renal disease: Secondary | ICD-10-CM | POA: Diagnosis present

## 2018-09-13 DIAGNOSIS — N186 End stage renal disease: Secondary | ICD-10-CM | POA: Diagnosis present

## 2018-09-13 DIAGNOSIS — I2510 Atherosclerotic heart disease of native coronary artery without angina pectoris: Secondary | ICD-10-CM | POA: Diagnosis present

## 2018-09-13 DIAGNOSIS — Z7984 Long term (current) use of oral hypoglycemic drugs: Secondary | ICD-10-CM

## 2018-09-13 DIAGNOSIS — D638 Anemia in other chronic diseases classified elsewhere: Secondary | ICD-10-CM | POA: Diagnosis present

## 2018-09-13 DIAGNOSIS — I213 ST elevation (STEMI) myocardial infarction of unspecified site: Secondary | ICD-10-CM | POA: Diagnosis present

## 2018-09-13 DIAGNOSIS — Z7982 Long term (current) use of aspirin: Secondary | ICD-10-CM

## 2018-09-13 DIAGNOSIS — Z87891 Personal history of nicotine dependence: Secondary | ICD-10-CM

## 2018-09-13 DIAGNOSIS — K8681 Exocrine pancreatic insufficiency: Secondary | ICD-10-CM | POA: Diagnosis present

## 2018-09-13 DIAGNOSIS — Z79890 Hormone replacement therapy (postmenopausal): Secondary | ICD-10-CM

## 2018-09-13 DIAGNOSIS — Z91041 Radiographic dye allergy status: Secondary | ICD-10-CM

## 2018-09-13 DIAGNOSIS — Z9841 Cataract extraction status, right eye: Secondary | ICD-10-CM

## 2018-09-13 DIAGNOSIS — Z7902 Long term (current) use of antithrombotics/antiplatelets: Secondary | ICD-10-CM

## 2018-09-13 DIAGNOSIS — I2129 ST elevation (STEMI) myocardial infarction involving other sites: Secondary | ICD-10-CM | POA: Diagnosis present

## 2018-09-13 LAB — COMPREHENSIVE METABOLIC PANEL
ALT: 18 U/L (ref 0–44)
AST: 22 U/L (ref 15–41)
Albumin: 1.9 g/dL — ABNORMAL LOW (ref 3.5–5.0)
Alkaline Phosphatase: 132 U/L — ABNORMAL HIGH (ref 38–126)
Anion gap: 14 (ref 5–15)
BUN: 48 mg/dL — ABNORMAL HIGH (ref 8–23)
CO2: 23 mmol/L (ref 22–32)
Calcium: 8 mg/dL — ABNORMAL LOW (ref 8.9–10.3)
Chloride: 96 mmol/L — ABNORMAL LOW (ref 98–111)
Creatinine, Ser: 4.76 mg/dL — ABNORMAL HIGH (ref 0.44–1.00)
GFR calc Af Amer: 9 mL/min — ABNORMAL LOW (ref >60)
GFR calc non Af Amer: 8 mL/min — ABNORMAL LOW (ref >60)
Glucose, Bld: 98 mg/dL (ref 70–99)
Potassium: 4.3 mmol/L (ref 3.5–5.1)
Sodium: 133 mmol/L — ABNORMAL LOW (ref 135–145)
Total Bilirubin: 0.6 mg/dL (ref 0.3–1.2)
Total Protein: 5.4 g/dL — ABNORMAL LOW (ref 6.5–8.1)

## 2018-09-13 LAB — URINALYSIS, ROUTINE W REFLEX MICROSCOPIC
Bilirubin Urine: NEGATIVE
Glucose, UA: 150 mg/dL — AB
Hgb urine dipstick: NEGATIVE
Ketones, ur: NEGATIVE mg/dL
Leukocytes,Ua: NEGATIVE
Nitrite: NEGATIVE
Protein, ur: 30 mg/dL — AB
Specific Gravity, Urine: 1.008 (ref 1.005–1.030)
pH: 9 — ABNORMAL HIGH (ref 5.0–8.0)

## 2018-09-13 LAB — CBC WITH DIFFERENTIAL/PLATELET
Abs Immature Granulocytes: 0.14 10*3/uL — ABNORMAL HIGH (ref 0.00–0.07)
Basophils Absolute: 0 10*3/uL (ref 0.0–0.1)
Basophils Relative: 0 %
Eosinophils Absolute: 0.1 10*3/uL (ref 0.0–0.5)
Eosinophils Relative: 1 %
HCT: 39.4 % (ref 36.0–46.0)
Hemoglobin: 11.8 g/dL — ABNORMAL LOW (ref 12.0–15.0)
Immature Granulocytes: 2 %
Lymphocytes Relative: 7 %
Lymphs Abs: 0.7 10*3/uL (ref 0.7–4.0)
MCH: 27.5 pg (ref 26.0–34.0)
MCHC: 29.9 g/dL — ABNORMAL LOW (ref 30.0–36.0)
MCV: 91.8 fL (ref 80.0–100.0)
Monocytes Absolute: 1 10*3/uL (ref 0.1–1.0)
Monocytes Relative: 11 %
Neutro Abs: 7.1 10*3/uL (ref 1.7–7.7)
Neutrophils Relative %: 79 %
Platelets: 112 10*3/uL — ABNORMAL LOW (ref 150–400)
RBC: 4.29 MIL/uL (ref 3.87–5.11)
RDW: 14.5 % (ref 11.5–15.5)
WBC: 9 10*3/uL (ref 4.0–10.5)
nRBC: 0 % (ref 0.0–0.2)

## 2018-09-13 LAB — BODY FLUID CELL COUNT WITH DIFFERENTIAL
Eos, Fluid: 0 %
Lymphs, Fluid: 3 %
Monocyte-Macrophage-Serous Fluid: 6 % — ABNORMAL LOW (ref 50–90)
Neutrophil Count, Fluid: 91 % — ABNORMAL HIGH (ref 0–25)
Other Cells, Fluid: 0 %
Total Nucleated Cell Count, Fluid: 1133 cu mm — ABNORMAL HIGH (ref 0–1000)

## 2018-09-13 LAB — LACTIC ACID, PLASMA: Lactic Acid, Venous: 1.5 mmol/L (ref 0.5–1.9)

## 2018-09-13 LAB — GRAM STAIN

## 2018-09-13 LAB — LIPASE, BLOOD: Lipase: 17 U/L (ref 11–51)

## 2018-09-13 LAB — AMMONIA: Ammonia: 24 umol/L (ref 9–35)

## 2018-09-13 MED ORDER — POLYETHYLENE GLYCOL 3350 17 G PO PACK
17.0000 g | PACK | Freq: Every day | ORAL | Status: DC | PRN
  Administered 2018-09-14: 17 g via ORAL
  Filled 2018-09-13: qty 1

## 2018-09-13 MED ORDER — ASPIRIN EC 81 MG PO TBEC
81.0000 mg | DELAYED_RELEASE_TABLET | Freq: Every day | ORAL | Status: DC
  Administered 2018-09-14 – 2018-09-17 (×4): 81 mg via ORAL
  Filled 2018-09-13 (×4): qty 1

## 2018-09-13 MED ORDER — ONDANSETRON HCL 4 MG PO TABS
4.0000 mg | ORAL_TABLET | Freq: Four times a day (QID) | ORAL | Status: DC | PRN
  Administered 2018-09-14: 4 mg via ORAL
  Filled 2018-09-13: qty 1

## 2018-09-13 MED ORDER — DELFLEX-LC/1.5% DEXTROSE 344 MOSM/L IP SOLN: INTRAPERITONEAL | Status: DC

## 2018-09-13 MED ORDER — CEFTAZIDIME 200 MG/ML FOR DIALYSIS: 1000.0000 mg | INJECTION | Freq: Once | INTRAVENOUS_CENTRAL | Status: DC

## 2018-09-13 MED ORDER — NEPRO/CARBSTEADY PO LIQD
237.0000 mL | Freq: Every day | ORAL | Status: DC
  Administered 2018-09-14 – 2018-09-15 (×2): 237 mL via ORAL
  Filled 2018-09-13 (×4): qty 237

## 2018-09-13 MED ORDER — HEPARIN 1000 UNIT/ML FOR PERITONEAL DIALYSIS: 500.0000 [IU] | INTRAMUSCULAR | Status: DC | PRN

## 2018-09-13 MED ORDER — DOCUSATE SODIUM 100 MG PO CAPS
100.0000 mg | ORAL_CAPSULE | Freq: Every day | ORAL | Status: DC
  Administered 2018-09-14 – 2018-09-17 (×4): 100 mg via ORAL
  Filled 2018-09-13 (×5): qty 1

## 2018-09-13 MED ORDER — HYDROCODONE-ACETAMINOPHEN 5-325 MG PO TABS
1.0000 | ORAL_TABLET | ORAL | Status: DC | PRN
  Administered 2018-09-14 – 2018-09-17 (×7): 2 via ORAL
  Filled 2018-09-13 (×7): qty 2

## 2018-09-13 MED ORDER — GENTAMICIN SULFATE 0.1 % EX CREA
1.0000 "application " | TOPICAL_CREAM | Freq: Every day | CUTANEOUS | Status: DC
  Administered 2018-09-14 – 2018-09-15 (×3): 1 via TOPICAL
  Filled 2018-09-13 (×2): qty 15

## 2018-09-13 MED ORDER — FENTANYL CITRATE (PF) 100 MCG/2ML IJ SOLN
50.0000 ug | Freq: Once | INTRAMUSCULAR | Status: AC
  Administered 2018-09-13: 22:00:00 50 ug via INTRAVENOUS
  Filled 2018-09-13: qty 2

## 2018-09-13 MED ORDER — TRAZODONE HCL 50 MG PO TABS
50.0000 mg | ORAL_TABLET | Freq: Every day | ORAL | Status: DC
  Administered 2018-09-14 – 2018-09-16 (×4): 50 mg via ORAL
  Filled 2018-09-13 (×5): qty 1

## 2018-09-13 MED ORDER — INSULIN ASPART 100 UNIT/ML ~~LOC~~ SOLN
0.0000 [IU] | Freq: Three times a day (TID) | SUBCUTANEOUS | Status: DC
  Administered 2018-09-14: 2 [IU] via SUBCUTANEOUS
  Administered 2018-09-14: 5 [IU] via SUBCUTANEOUS
  Administered 2018-09-15 – 2018-09-16 (×3): 1 [IU] via SUBCUTANEOUS
  Administered 2018-09-16 – 2018-09-17 (×2): 2 [IU] via SUBCUTANEOUS
  Administered 2018-09-17: 1 [IU] via SUBCUTANEOUS

## 2018-09-13 MED ORDER — PRO-STAT SUGAR FREE PO LIQD
30.0000 mL | Freq: Two times a day (BID) | ORAL | Status: DC
  Administered 2018-09-14 – 2018-09-17 (×7): 30 mL via ORAL
  Filled 2018-09-13 (×7): qty 30

## 2018-09-13 MED ORDER — LEVOTHYROXINE SODIUM 100 MCG PO TABS
100.0000 ug | ORAL_TABLET | Freq: Every day | ORAL | Status: DC
  Administered 2018-09-14 – 2018-09-17 (×4): 100 ug via ORAL
  Filled 2018-09-13 (×4): qty 1

## 2018-09-13 MED ORDER — HEPARIN SODIUM (PORCINE) 5000 UNIT/ML IJ SOLN
5000.0000 [IU] | Freq: Three times a day (TID) | INTRAMUSCULAR | Status: DC
  Administered 2018-09-14 – 2018-09-17 (×10): 5000 [IU] via SUBCUTANEOUS
  Filled 2018-09-13 (×10): qty 1

## 2018-09-13 MED ORDER — VANCOMYCIN HCL 10 G IV SOLR
1500.0000 mg | Freq: Once | INTRAVENOUS | Status: AC
  Administered 2018-09-13: 21:00:00 1500 mg via INTRAVENOUS
  Filled 2018-09-13: qty 1500

## 2018-09-13 MED ORDER — HEPARIN 1000 UNIT/ML FOR PERITONEAL DIALYSIS
INTRAPERITONEAL | Status: DC | PRN
  Filled 2018-09-13: qty 3000

## 2018-09-13 MED ORDER — PANTOPRAZOLE SODIUM 40 MG PO TBEC
40.0000 mg | DELAYED_RELEASE_TABLET | Freq: Every day | ORAL | Status: DC
  Administered 2018-09-14 – 2018-09-17 (×4): 40 mg via ORAL
  Filled 2018-09-13 (×4): qty 1

## 2018-09-13 MED ORDER — CITALOPRAM HYDROBROMIDE 20 MG PO TABS
10.0000 mg | ORAL_TABLET | Freq: Every day | ORAL | Status: DC
  Administered 2018-09-14 – 2018-09-17 (×4): 10 mg via ORAL
  Filled 2018-09-13 (×4): qty 1

## 2018-09-13 MED ORDER — ONDANSETRON HCL 4 MG/2ML IJ SOLN: 4.0000 mg | Freq: Four times a day (QID) | INTRAMUSCULAR | Status: DC | PRN

## 2018-09-13 MED ORDER — PROSIGHT PO TABS
1.0000 | ORAL_TABLET | Freq: Every day | ORAL | Status: DC
  Administered 2018-09-14 – 2018-09-17 (×4): 1 via ORAL
  Filled 2018-09-13 (×4): qty 1

## 2018-09-13 MED ORDER — ATORVASTATIN CALCIUM 40 MG PO TABS
40.0000 mg | ORAL_TABLET | Freq: Every day | ORAL | Status: DC
  Administered 2018-09-14 – 2018-09-17 (×5): 40 mg via ORAL
  Filled 2018-09-13: qty 4
  Filled 2018-09-13 (×4): qty 1

## 2018-09-13 MED ORDER — LEVETIRACETAM 250 MG PO TABS
250.0000 mg | ORAL_TABLET | Freq: Two times a day (BID) | ORAL | Status: DC
  Administered 2018-09-14 – 2018-09-17 (×8): 250 mg via ORAL
  Filled 2018-09-13 (×8): qty 1

## 2018-09-13 MED ORDER — CLOPIDOGREL BISULFATE 75 MG PO TABS
75.0000 mg | ORAL_TABLET | Freq: Every day | ORAL | Status: DC
  Administered 2018-09-14 – 2018-09-17 (×4): 75 mg via ORAL
  Filled 2018-09-13 (×4): qty 1

## 2018-09-13 MED ORDER — FENTANYL CITRATE (PF) 100 MCG/2ML IJ SOLN
25.0000 ug | Freq: Once | INTRAMUSCULAR | Status: AC
  Administered 2018-09-13: 25 ug via INTRAVENOUS
  Filled 2018-09-13: qty 2

## 2018-09-13 MED ORDER — ONDANSETRON HCL 4 MG/2ML IJ SOLN
4.0000 mg | Freq: Once | INTRAMUSCULAR | Status: AC
  Administered 2018-09-13: 4 mg via INTRAVENOUS
  Filled 2018-09-13: qty 2

## 2018-09-13 MED ORDER — POLYETHYLENE GLYCOL 3350 17 G PO PACK
17.0000 g | PACK | Freq: Every day | ORAL | Status: DC
  Administered 2018-09-14 – 2018-09-17 (×4): 17 g via ORAL
  Filled 2018-09-13 (×4): qty 1

## 2018-09-13 MED ORDER — LINAGLIPTIN 5 MG PO TABS
5.0000 mg | ORAL_TABLET | Freq: Every day | ORAL | Status: DC
  Administered 2018-09-14 – 2018-09-17 (×4): 5 mg via ORAL
  Filled 2018-09-13 (×4): qty 1

## 2018-09-13 MED ORDER — PANCRELIPASE (LIP-PROT-AMYL) 36000-114000 UNITS PO CPEP
48000.0000 [IU] | ORAL_CAPSULE | Freq: Three times a day (TID) | ORAL | Status: DC
  Administered 2018-09-14 – 2018-09-17 (×12): 48000 [IU] via ORAL
  Filled 2018-09-13 (×15): qty 1

## 2018-09-13 MED ORDER — SODIUM CHLORIDE 0.9 % IV BOLUS
500.0000 mL | Freq: Once | INTRAVENOUS | Status: AC
  Administered 2018-09-13: 500 mL via INTRAVENOUS

## 2018-09-13 MED ORDER — SEVELAMER CARBONATE 800 MG PO TABS
2400.0000 mg | ORAL_TABLET | Freq: Three times a day (TID) | ORAL | Status: DC
  Administered 2018-09-14 – 2018-09-17 (×12): 2400 mg via ORAL
  Filled 2018-09-13 (×12): qty 3

## 2018-09-13 MED ORDER — PREDNISOLONE ACETATE 1 % OP SUSP
1.0000 [drp] | Freq: Every day | OPHTHALMIC | Status: DC
  Administered 2018-09-14 – 2018-09-16 (×4): 1 [drp] via OPHTHALMIC
  Filled 2018-09-13: qty 5

## 2018-09-13 MED ORDER — DELFLEX-LC/1.5% DEXTROSE 344 MOSM/L IP SOLN
Freq: Once | INTRAVENOUS_CENTRAL | Status: DC
  Filled 2018-09-13 (×2): qty 3000

## 2018-09-13 MED ORDER — CARVEDILOL 3.125 MG PO TABS
3.1250 mg | ORAL_TABLET | Freq: Two times a day (BID) | ORAL | Status: DC
  Administered 2018-09-14 – 2018-09-17 (×8): 3.125 mg via ORAL
  Filled 2018-09-13 (×8): qty 1

## 2018-09-13 NOTE — ED Notes (Addendum)
Son- Harlon Ditty  phone number- [phone]  *Please call for any questions and medications.*

## 2018-09-13 NOTE — ED Notes (Addendum)
ED TO INPATIENT HANDOFF REPORT  ED Nurse Name and Phone #: Gifford Shave [phone]  S Name/Age/Gender Samantha Conrad 78 y.o. female Room/Bed: 034C/034C  Code Status   Code Status: Prior  Home/SNF/Other Home Patient oriented to: self, place, time and situation Is this baseline? Yes   Triage Complete: Triage complete  Chief Complaint Peritonitis  Triage Note Pt here from her MD office with abd pain that started this morining , pt does do PD every night    Allergies Allergies  Allergen Reactions  . Contrast Media [Iodinated Diagnostic Agents] Swelling    SWELLING REACTION UNSPECIFIED   . Tape Other (See Comments)    Tears skin - please use paper tape    Level of Care/Admitting Diagnosis ED Disposition    ED Disposition Condition Comment   Admit  Hospital Area: [facility] [[id number]]  Level of Care: Med-Surg [16]  Covid Evaluation: N/A  Diagnosis: Spontaneous bacterial peritonitis ([place]) [256.38.ICD-9-CM]  Admitting Physician: Leeanne Rio [[id number]]  Attending Physician: Leeanne Rio [phone]  Estimated length of stay: past midnight tomorrow  Certification:: I certify this patient will need inpatient services for at least 2 midnights  PT Class (Do Not Modify): Inpatient [101]  PT Acc Code (Do Not Modify): Private [1]       B Medical/Surgery History Past Medical History:  Diagnosis Date  . Anemia   . Basal cell carcinoma     on nose/face  . CAD (coronary artery disease)    a. 08/2018: posterior STEMI s/p DES to LCx x2  . Depression   . ESRD on peritoneal dialysis ([place])    "q night" (11/29/2017)  . Fatty liver   . Hypertension   . Hypothyroidism   . NASH (nonalcoholic steatohepatitis)   . Seizures ([place])    ? when she may have had a stroke sometime in the spring of 2018  . Sleep apnea    diagnosed years ago "after I'd gained alot of weight;  no longer a problem since weight loss"  . Stroke [facility]) 04/2016   memory issues, lost  1/2 vision in both eyes  . Thrombocytopenia ([place])   . Thyroid disease   . Type II diabetes mellitus ([place])    Past Surgical History:  Procedure Laterality Date  . ABDOMINAL HYSTERECTOMY    . AV FISTULA PLACEMENT Right 10/30/2016   Procedure: INSERTION OF ARTERIOVENOUS (AV) GORE-TEX GRAFT RIGHT UPPER ARM;  Surgeon: Elam Dutch, MD;  Location: [facility];  Service: Vascular;  Laterality: Right;  . CATARACT EXTRACTION W/ INTRAOCULAR LENS  IMPLANT, BILATERAL Bilateral   . CHOLECYSTECTOMY OPEN    . CORNEAL TRANSPLANT Bilateral   . CORONARY ANGIOGRAPHY N/A 09/06/2018   Procedure: CORONARY ANGIOGRAPHY;  Surgeon: Lorretta Harp, MD;  Location: [place] [facility] LAB;  Service: Cardiovascular;  Laterality: N/A;  . CORONARY/GRAFT ACUTE MI REVASCULARIZATION N/A 09/06/2018   Procedure: Coronary/Graft Acute MI Revascularization;  Surgeon: Lorretta Harp, MD;  Location: [place] [facility];  Service: Cardiovascular;  Laterality: N/A;  . EYE SURGERY    . KNEE ARTHROSCOPY Left   . LEFT HEART CATH AND CORONARY ANGIOGRAPHY N/A 09/06/2018   Procedure: LEFT HEART CATH AND CORONARY ANGIOGRAPHY;  Surgeon: Lorretta Harp, MD;  Location: [place] [facility];  Service: Cardiovascular;  Laterality: N/A;  . MOHS SURGERY     "nose"  . SHOULDER OPEN ROTATOR CUFF REPAIR Left   . THROMBECTOMY AND REVISION OF ARTERIOVENTOUS (AV) GORETEX  GRAFT Right 12/04/2016  Procedure: THROMBECTOMY/ REVISION OF RIGHT UPPER ARM ARTERIOVENOUS GORETEX GRAFT;  Surgeon: Elam Dutch, MD;  Location: [facility];  Service: Vascular;  Laterality: Right;  . TONSILLECTOMY       A IV Location/Drains/Wounds Patient Lines/Drains/Airways Status   Active Line/Drains/Airways    Name:   Placement date:   Placement time:   Site:   Days:   Peripheral IV 09/13/18 Left Forearm   09/13/18    1337    Forearm   less than 1   Fistula / Graft Right Upper arm Arteriovenous vein graft   10/30/16    -    Upper arm   683          Intake/Output Last 24  hours  Intake/Output Summary (Last 24 hours) at 09/13/2018 2113 Last data filed at 09/13/2018 1601 Gross per 24 hour  Intake 500 ml  Output -  Net 500 ml    Labs/Imaging Results for orders placed or performed during the hospital encounter of 09/13/18 (from the past 48 hour(s))  CBC with Differential/Platelet     Status: Abnormal   Collection Time: 09/13/18  1:35 PM  Result Value Ref Range   WBC 9.0 4.0 - 10.5 K/uL   RBC 4.29 3.87 - 5.11 MIL/uL   Hemoglobin 11.8 (L) 12.0 - 15.0 g/dL   HCT 39.4 36.0 - 46.0 %   MCV 91.8 80.0 - 100.0 fL   MCH 27.5 26.0 - 34.0 pg   MCHC 29.9 (L) 30.0 - 36.0 g/dL   RDW 14.5 11.5 - 15.5 %   Platelets 112 (L) 150 - 400 K/uL    Comment: REPEATED TO VERIFY Immature Platelet Fraction may be clinically indicated, consider ordering this additional test [id number] CONSISTENT WITH PREVIOUS RESULT    nRBC 0.0 0.0 - 0.2 %   Neutrophils Relative % 79 %   Neutro Abs 7.1 1.7 - 7.7 K/uL   Lymphocytes Relative 7 %   Lymphs Abs 0.7 0.7 - 4.0 K/uL   Monocytes Relative 11 %   Monocytes Absolute 1.0 0.1 - 1.0 K/uL   Eosinophils Relative 1 %   Eosinophils Absolute 0.1 0.0 - 0.5 K/uL   Basophils Relative 0 %   Basophils Absolute 0.0 0.0 - 0.1 K/uL   Immature Granulocytes 2 %   Abs Immature Granulocytes 0.14 (H) 0.00 - 0.07 K/uL    Comment: Performed at [facility], [address], [place],  [address]  Comprehensive metabolic panel     Status: Abnormal   Collection Time: 09/13/18  1:35 PM  Result Value Ref Range   Sodium 133 (L) 135 - 145 mmol/L   Potassium 4.3 3.5 - 5.1 mmol/L   Chloride 96 (L) 98 - 111 mmol/L   CO2 23 22 - 32 mmol/L   Glucose, Bld 98 70 - 99 mg/dL   BUN 48 (H) 8 - 23 mg/dL   Creatinine, Ser 4.76 (H) 0.44 - 1.00 mg/dL   Calcium 8.0 (L) 8.9 - 10.3 mg/dL   Total Protein 5.4 (L) 6.5 - 8.1 g/dL   Albumin 1.9 (L) 3.5 - 5.0 g/dL   AST 22 15 - 41 U/L   ALT 18 0 - 44 U/L   Alkaline Phosphatase 132 (H) 38 - 126 U/L   Total Bilirubin  0.6 0.3 - 1.2 mg/dL   GFR calc non Af Amer 8 (L) >60 mL/min   GFR calc Af Amer 9 (L) >60 mL/min   Anion gap 14 5 - 15    Comment:  Performed at [facility], [place] [address] [place], [place] [address]  Lipase, blood     Status: None   Collection Time: 09/13/18  1:35 PM  Result Value Ref Range   Lipase 17 11 - 51 U/L    Comment: Performed at [facility] [address] [place], [place] [address]  Lactic acid, plasma     Status: None   Collection Time: 09/13/18  2:03 PM  Result Value Ref Range   Lactic Acid, Venous 1.5 0.5 - 1.9 mmol/L    Comment: Performed at [facility] [address], [place], [place] [address]  Ammonia     Status: None   Collection Time: 09/13/18  2:04 PM  Result Value Ref Range   Ammonia 24 9 - 35 umol/L    Comment: Performed at [facility], [place] [address], [place], [place] [address]  Urinalysis, Routine w reflex microscopic     Status: Abnormal   Collection Time: 09/13/18  3:54 PM  Result Value Ref Range   Color, Urine YELLOW YELLOW   APPearance CLEAR CLEAR   Specific Gravity, Urine 1.008 1.005 - 1.030   pH 9.0 (H) 5.0 - 8.0   Glucose, UA 150 (A) NEGATIVE mg/dL   Hgb urine dipstick NEGATIVE NEGATIVE   Bilirubin Urine NEGATIVE NEGATIVE   Ketones, ur NEGATIVE NEGATIVE mg/dL   Protein, ur 30 (A) NEGATIVE mg/dL   Nitrite NEGATIVE NEGATIVE   Leukocytes,Ua NEGATIVE NEGATIVE   RBC / HPF 0-5 0 - 5 RBC/hpf   WBC, UA 0-5 0 - 5 WBC/hpf   Bacteria, UA RARE (A) NONE SEEN   Squamous Epithelial / LPF 6-10 0 - 5    Comment: Performed at [facility], [place] [address], [place], [place] [address]  Gram stain     Status: None   Collection Time: 09/13/18  4:56 PM  Result Value Ref Range   Specimen Description FLUID PERITONEAL    Special Requests NONE    Gram Stain      ABUNDANT WBC PRESENT,BOTH PMN AND MONONUCLEAR NO ORGANISMS SEEN Performed at [facility], [place] [address], [place], [place] [address]    Report Status 09/13/2018  FINAL   Body fluid cell count with differential     Status: Abnormal   Collection Time: 09/13/18  6:40 PM  Result Value Ref Range   Fluid Type-FCT PERITONEAL DIALYSATE     Comment: FLUID CORRECTED ON 04/17 AT 1904: PREVIOUSLY REPORTED AS Peritoneal    Color, Fluid STRAW (A) YELLOW   Appearance, Fluid HAZY (A) CLEAR   WBC, Fluid 1,133 (H) 0 - 1,000 cu mm   Neutrophil Count, Fluid 91 (H) 0 - 25 %   Lymphs, Fluid 3 %   Monocyte-Macrophage-Serous Fluid 6 (L) 50 - 90 %   Eos, Fluid 0 %   Other Cells, Fluid 0 %    Comment: Performed at [facility], [place] [address], [place], [place] [address]   Ct Abdomen Pelvis Wo Contrast  Result Date: 09/13/2018 CLINICAL DATA:  Acute right-sided abdominal pain. EXAM: CT ABDOMEN AND PELVIS WITHOUT CONTRAST TECHNIQUE: Multidetector CT imaging of the abdomen and pelvis was performed following the standard protocol without IV contrast. COMPARISON:  CT scan of March 11, 2017. FINDINGS: Lower chest: Mild bilateral pleural effusions are noted with adjacent subsegmental atelectasis. Hepatobiliary: No focal liver abnormality is seen. Status post cholecystectomy. No biliary dilatation. Pancreas: Stable pancreatic calcifications are noted consistent with chronic  pancreatitis. No acute inflammation or ductal dilatation is noted. Spleen: Normal in size without focal abnormality. Adrenals/Urinary Tract: Adrenal glands appear normal. Stable right renal cyst is noted. Bilateral renal atrophy is noted consistent with history of end-stage renal disease. No hydronephrosis or renal obstruction is noted. No renal or ureteral calculi are noted. Urinary bladder is unremarkable. Stomach/Bowel: The appendix appears unremarkable. There is no evidence of bowel obstruction. Sigmoid diverticulosis is noted without inflammation. Wall thickening of gastric antrum is noted which may represent peptic ulcer disease. Vascular/Lymphatic: Aortic atherosclerosis. No enlarged abdominal or pelvic  lymph nodes. Reproductive: Status post hysterectomy. No adnexal masses. Other: Mild pneumoperitoneum is noted in the epigastric region which most likely is related to peritoneal dialysis. Distal tip of dialysis catheter is seen in the pelvis. Musculoskeletal: No acute or significant osseous findings. IMPRESSION: Mild pneumoperitoneum is noted in epigastric region which most likely is related to peritoneal dialysis. Mild wall thickening of gastric antrum is noted which may represent peptic ulcer disease. Clinical correlation and endoscopy may be performed for further evaluation. Stable pancreatic calcifications are noted consistent with chronic pancreatitis. Mild bilateral pleural effusions are noted with adjacent subsegmental atelectasis. Aortic Atherosclerosis (ICD10-I70.0). Electronically Signed   By: Marijo Conception M.D.   On: 09/13/2018 14:21    Pending Labs Unresulted Labs (From admission, onward)    Start     Ordered   09/13/18 1656  Culture, body fluid-bottle  Once,   R     09/13/18 1656   09/13/18 1335  Culture, blood (Routine X 2) w Reflex to ID Panel  BLOOD CULTURE X 2,   STAT    Question:  Patient immune status  Answer:  Normal   09/13/18 1337   09/13/18 1335  Urine culture  ONCE - STAT,   STAT    Question:  Patient immune status  Answer:  Normal   09/13/18 1337          Vitals/Pain Today's Vitals   09/13/18 1900 09/13/18 1930 09/13/18 1945 09/13/18 2035  BP: (!) 115/56  (!) 115/58   Pulse: 68  68   Resp: 13 15    Temp:      TempSrc:      SpO2: 97%  95%   Weight:      Height:      PainSc:    0-No pain    Isolation Precautions No active isolations  Medications Medications  vancomycin (VANCOCIN) 1,500 mg in sodium chloride 0.9 % 500 mL IVPB (has no administration in time range)  ondansetron (ZOFRAN) injection 4 mg (4 mg Intravenous Given 09/13/18 1409)  sodium chloride 0.9 % bolus 500 mL (0 mLs Intravenous Stopped 09/13/18 1601)  fentaNYL (SUBLIMAZE) injection 25 mcg  (25 mcg Intravenous Given 09/13/18 1411)    Mobility walks Moderate fall risk   Focused Assessments Renal Assessment Handoff:  Peritoneal every night Last Hemodialysis date and time: 4/16   Right arm restricted        R Recommendations: See Admitting Provider Note  Report given to:   Additional Notes:

## 2018-09-13 NOTE — ED Triage Notes (Signed)
Pt here from her MD office with abd pain that started this morining , pt does do PD every night

## 2018-09-13 NOTE — ED Provider Notes (Signed)
Care assumed from Dr. Lita Mains.  At time of transfer care, patient is awaiting results of peritoneal fluid analysis to determine if patient has SBP.  If SBP is not seen, anticipate discharge home per previous plan.  8:43 PM Patient's body fluid cell count with differential has resulted from the peritoneal fluid.  It shows that patient has hazy fluid, a white blood cell count of 1133 with elevated neutrophil count.  Review shows that this is similar to how it looked when the patient had SBP 2 years ago.  Nephrology was called who recommends admission for further management.  They recommended IV vancomycin and intraperitoneal Tressie Ellis.  They will see the patient tonight.  Family medicine team called for admission.   Clinical Impression: 1. Peritonitis ([place])     Disposition: Admit  This note was prepared with assistance of Dragon voice recognition software. Occasional wrong-word or sound-a-like substitutions may have occurred due to the inherent limitations of voice recognition software.      Henli Hey, Gwenyth Allegra, MD 09/14/18 [phone]

## 2018-09-13 NOTE — ED Provider Notes (Signed)
[facility] EMERGENCY DEPARTMENT Provider Note   CSN: [id number] Arrival date & time: 09/13/18  1301    History   Chief Complaint No chief complaint on file.   HPI Samantha Conrad is a 78 y.o. female.     HPI Patient with end-stage renal disease on peritoneal dialysis every night.  Last dialyzed yesterday evening.  States she woke up with diffuse abdominal pain worse in the right lower quadrant.  She is had nausea but no vomiting or diarrhea.  No bloody stool.  Patient has had a fever at home.  States the dialysis fluid looked normal yesterday evening.  Patient does make urine.  Recently hospitalized for STEMI and had 2 stents placed. Past Medical History:  Diagnosis Date   Anemia    Basal cell carcinoma     on nose/face   CAD (coronary artery disease)    a. 08/2018: posterior STEMI s/p DES to LCx x2   Depression    ESRD on peritoneal dialysis ([place])    "q night" (11/29/2017)   Fatty liver    Hypertension    Hypothyroidism    NASH (nonalcoholic steatohepatitis)    Seizures ([place])    ? when she may have had a stroke sometime in the spring of 2018   Sleep apnea    diagnosed years ago "after I'd gained alot of weight;  no longer a problem since weight loss"   Stroke ([place]) 04/2016   memory issues, lost 1/2 vision in both eyes   Thrombocytopenia ([place])    Thyroid disease    Type II diabetes mellitus [facility])     Patient Active Problem List   Diagnosis Date Noted   Spontaneous bacterial peritonitis ([place]) 09/13/2018   CAD (coronary artery disease)    Anemia    Thrombocytopenia (H[facility])    Acute ST elevation myocardial infarction (STEMI) of posterior wall ([place]) 09/06/2018   Symptomatic anemia 03/11/2017   Diet-controlled diabetes mellitus ([place]) 03/11/2017   NASH (nonalcoholic steatohepatitis) 03/11/2017   Hypertension 03/11/2017   Hypothyroidism 03/11/2017   ESRD on dialysis ([place]) 03/11/2017   Seizure disorder 03/11/2017    Abdominal pain 03/11/2017   HLD (hyperlipidemia) 03/11/2017   Chronic pancreatitis ([place]) 03/11/2017   Constipation 03/11/2017   Laryngopharyngeal reflux (LPR) 02/17/2015   H/O Mild allergic rhinitis 02/17/2015   Cirrhosis ([place]) 02/17/2015    Past Surgical History:  Procedure Laterality Date   ABDOMINAL HYSTERECTOMY     AV FISTULA PLACEMENT Right 10/30/2016   Procedure: INSERTION OF ARTERIOVENOUS (AV) GORE-TEX GRAFT RIGHT UPPER ARM;  Surgeon: Elam Dutch, MD;  Location: [place];  Service: Vascular;  Laterality: Right;   CATARACT EXTRACTION W/ INTRAOCULAR LENS  IMPLANT, BILATERAL Bilateral    CHOLECYSTECTOMY OPEN     CORNEAL TRANSPLANT Bilateral    CORONARY ANGIOGRAPHY N/A 09/06/2018   Procedure: CORONARY ANGIOGRAPHY;  Surgeon: Lorretta Harp, MD;  Location: [place] CV LAB;  Service: Cardiovascular;  Laterality: N/A;   CORONARY/GRAFT ACUTE MI REVASCULARIZATION N/A 09/06/2018   Procedure: Coronary/Graft Acute MI Revascularization;  Surgeon: Lorretta Harp, MD;  Location: [place] CV LAB;  Service: Cardiovascular;  Laterality: N/A;   EYE SURGERY     KNEE ARTHROSCOPY Left    LEFT HEART CATH AND CORONARY ANGIOGRAPHY N/A 09/06/2018   Procedure: LEFT HEART CATH AND CORONARY ANGIOGRAPHY;  Surgeon: Lorretta Harp, MD;  Location: [place] CV LAB;  Service: Cardiovascular;  Laterality: N/A;   MOHS SURGERY     "nose"   SHOULDER  OPEN ROTATOR CUFF REPAIR Left    THROMBECTOMY AND REVISION OF ARTERIOVENTOUS (AV) GORETEX  GRAFT Right 12/04/2016   Procedure: THROMBECTOMY/ REVISION OF RIGHT UPPER ARM ARTERIOVENOUS GORETEX GRAFT;  Surgeon: Elam Dutch, MD;  Location: [facility];  Service: Vascular;  Laterality: Right;   TONSILLECTOMY       OB History   No obstetric history on file.      Home Medications    Prior to Admission medications   Medication Sig Start Date End Date Taking? Authorizing Provider  acetaminophen-codeine (TYLENOL #3) 300-30 MG tablet Take  1-2 tablets by mouth every 6 (six) hours as needed for moderate pain.   Yes [provider]  Amino Acids-Protein Hydrolys (FEEDING SUPPLEMENT, PRO-STAT SUGAR FREE 64,) LIQD Take 30 mLs by mouth 2 (two) times daily. 03/16/17  Yes Patrecia Pour, Christean Grief, MD  aspirin EC 81 MG tablet Take 81 mg by mouth daily with breakfast.   Yes [provider]  atorvastatin (LIPITOR) 40 MG tablet Take 1 tablet (40 mg total) by mouth daily at 6 PM. Patient taking differently: Take 40 mg by mouth daily with supper.  09/08/18  Yes Eileen Stanford, PA-C  carvedilol (COREG) 3.125 MG tablet Take 1 tablet (3.125 mg total) by mouth 2 (two) times daily with a meal. 09/08/18  Yes Eileen Stanford, PA-C  citalopram (CELEXA) 10 MG tablet Take 10 mg by mouth daily with breakfast.  10/15/17  Yes [provider]  clopidogrel (PLAVIX) 75 MG tablet Take 1 tablet (75 mg total) by mouth daily. Patient taking differently: Take 75 mg by mouth daily with breakfast.  09/08/18  Yes Eileen Stanford, PA-C  docusate sodium (COLACE) 100 MG capsule Take 100 mg by mouth daily with lunch.   Yes [provider]  furosemide (LASIX) 40 MG tablet Take 40 mg by mouth 2 (two) times daily with breakfast and lunch.    Yes [provider]  glipiZIDE (GLUCOTROL) 5 MG tablet Take 5 mg by mouth daily before breakfast.  06/25/18  Yes [provider]  levETIRAcetam (KEPPRA) 250 MG tablet Take 250 mg by mouth 2 (two) times daily with a meal.    Yes [provider]  levothyroxine (SYNTHROID, LEVOTHROID) 100 MCG tablet Take 100 mcg by mouth daily before breakfast.  09/02/16  Yes [provider]  linagliptin (TRADJENTA) 5 MG TABS tablet Take 5 mg by mouth daily with breakfast.    Yes [provider]  lipase/protease/amylase (CREON) 12000 units CPEP capsule Take 48,000 Units by mouth 3 (three) times daily with meals.   Yes [provider]  midodrine (PROAMATINE) 5 MG tablet  Take 5 mg by mouth daily as needed (SBP <110).  07/30/18  Yes [provider]  multivitamin (PROSIGHT) TABS tablet Take 1 tablet by mouth daily. Patient taking differently: Take 1 tablet by mouth daily with breakfast.  03/17/17  Yes Patrecia Pour, Christean Grief, MD  nitroGLYCERIN (NITROSTAT) 0.4 MG SL tablet Place 0.4 mg under the tongue every 5 (five) minutes as needed for chest pain.   Yes [provider]  Nutritional Supplements (FEEDING SUPPLEMENT, NEPRO CARB STEADY,) LIQD Take 237 mLs by mouth daily. Patient taking differently: Take 237 mLs by mouth daily with lunch.  03/17/17  Yes Patrecia Pour, Christean Grief, MD  pantoprazole (PROTONIX) 40 MG tablet Take 40 mg by mouth daily with breakfast.  08/21/18  Yes [provider]  polyethylene glycol (MIRALAX / GLYCOLAX) packet Take 17 g by mouth daily.    Yes  [provider]  potassium chloride SA (K-DUR,KLOR-CON) 20 MEQ tablet Take 1 tablet (20 mEq total) by mouth 2 (two) times daily. Patient taking differently: Take 20 mEq by mouth daily with breakfast.  11/30/17  Yes Meccariello, Bernita Raisin, DO  prednisoLONE acetate (PRED FORTE) 1 % ophthalmic suspension Place 1 drop into both eyes at bedtime.    Yes [provider]  sevelamer carbonate (RENVELA) 800 MG tablet Take 2,400 mg by mouth 3 (three) times daily with meals.  07/04/18  Yes [provider]  traZODone (DESYREL) 50 MG tablet Take 50 mg by mouth at bedtime.   Yes [provider]  aspirin 81 MG chewable tablet Chew 1 tablet (81 mg total) by mouth daily. Patient not taking: Reported on 09/13/2018 09/08/18   Eileen Stanford, PA-C  calcitRIOL (ROCALTROL) 0.25 MCG capsule Take 1 capsule (0.25 mcg total) by mouth daily. Patient not taking: Reported on 06/11/2018 12/01/17   Meccariello, Bernita Raisin, DO  calcium acetate (PHOSLO) 667 MG capsule Take 2 capsules (1,334 mg total) by mouth 3 (three) times daily with meals. Patient not taking: Reported on 06/11/2018 11/30/17    Meccariello, Bernita Raisin, DO  losartan (COZAAR) 25 MG tablet Take 1 tablet (25 mg total) by mouth daily. Patient not taking: Reported on 09/13/2018 09/08/18   Eileen Stanford, PA-C    Family History History reviewed. No pertinent family history.  Social History Social History   Tobacco Use   Smoking status: Never Smoker   Smokeless tobacco: Former Systems developer    Types: Snuff   Tobacco comment: "no snuff since < 1980s"  Substance Use Topics   Alcohol use: Never    Frequency: Never   Drug use: Never     Allergies   Contrast media [iodinated diagnostic agents] and Tape   Review of Systems Review of Systems  Constitutional: Negative for chills and fever.  HENT: Negative for sore throat and trouble swallowing.   Eyes: Negative for visual disturbance.  Respiratory: Negative for cough, shortness of breath and wheezing.   Cardiovascular: Negative for chest pain and palpitations.  Gastrointestinal: Positive for abdominal pain and nausea. Negative for constipation, diarrhea and vomiting.  Genitourinary: Negative for dysuria, flank pain and frequency.  Musculoskeletal: Negative for back pain and myalgias.  Skin: Negative for rash and wound.  Neurological: Negative for dizziness, weakness, light-headedness, numbness and headaches.  Psychiatric/Behavioral: Positive for confusion.  All other systems reviewed and are negative.    Physical Exam Updated Vital Signs BP (!) 118/58 (BP Location: Left Arm)    Pulse 66    Temp 97.8 F (36.6 C) (Oral)    Resp 18    Ht 5' 1"  (1.549 m)    Wt 63.1 kg    SpO2 95%    BMI 26.28 kg/m   Physical Exam Vitals signs and nursing note reviewed.  Constitutional:      Appearance: Normal appearance. She is well-developed.  HENT:     Head: Normocephalic and atraumatic.  Eyes:     Pupils: Pupils are equal, round, and reactive to light.  Neck:     Musculoskeletal: Normal range of motion and neck supple. No neck rigidity or muscular tenderness.    Cardiovascular:     Rate and Rhythm: Normal rate and regular rhythm.     Heart sounds: No murmur. No friction rub. No gallop.   Pulmonary:     Effort: Pulmonary effort is normal. No respiratory distress.     Breath sounds: Normal breath sounds. No  stridor. No wheezing, rhonchi or rales.  Chest:     Chest wall: No tenderness.  Abdominal:     General: Bowel sounds are normal. There is distension.     Palpations: Abdomen is soft.     Tenderness: There is abdominal tenderness. There is no guarding or rebound.     Comments: Dialysis catheter in place.  Patient has diffuse abdominal tenderness to palpation but appears most pronounced in the right lower quadrant.  Guarding is present.  Musculoskeletal: Normal range of motion.        General: No swelling, tenderness, deformity or signs of injury.     Right lower leg: No edema.     Left lower leg: No edema.  Lymphadenopathy:     Cervical: No cervical adenopathy.  Skin:    General: Skin is warm and dry.     Capillary Refill: Capillary refill takes less than 2 seconds.     Findings: No erythema or rash.  Neurological:     General: No focal deficit present.     Mental Status: She is alert and oriented to person, place, and time.  Psychiatric:        Behavior: Behavior normal.     Comments: Mild confusion and memory disturbance.      ED Treatments / Results  Labs (all labs ordered are listed, but only abnormal results are displayed) Labs Reviewed  URINE CULTURE - Abnormal; Notable for the following components:      Result Value   Culture   (*)    Value: <10,000 COLONIES/mL INSIGNIFICANT GROWTH Performed at [facility], [address] [place], [place] [address]    All other components within normal limits  CBC WITH DIFFERENTIAL/PLATELET - Abnormal; Notable for the following components:   Hemoglobin 11.8 (*)    MCHC 29.9 (*)    Platelets 112 (*)    Abs Immature Granulocytes 0.14 (*)    All other components within normal  limits  COMPREHENSIVE METABOLIC PANEL - Abnormal; Notable for the following components:   Sodium 133 (*)    Chloride 96 (*)    BUN 48 (*)    Creatinine, Ser 4.76 (*)    Calcium 8.0 (*)    Total Protein 5.4 (*)    Albumin 1.9 (*)    Alkaline Phosphatase 132 (*)    GFR calc non Af Amer 8 (*)    GFR calc Af Amer 9 (*)    All other components within normal limits  URINALYSIS, ROUTINE W REFLEX MICROSCOPIC - Abnormal; Notable for the following components:   pH 9.0 (*)    Glucose, UA 150 (*)    Protein, ur 30 (*)    Bacteria, UA RARE (*)    All other components within normal limits  BODY FLUID CELL COUNT WITH DIFFERENTIAL - Abnormal; Notable for the following components:   Color, Fluid STRAW (*)    Appearance, Fluid HAZY (*)    WBC, Fluid 1,133 (*)    Neutrophil Count, Fluid 91 (*)    Monocyte-Macrophage-Serous Fluid 6 (*)    All other components within normal limits  CBC - Abnormal; Notable for the following components:   RBC 3.28 (*)    Hemoglobin 9.0 (*)    HCT 30.6 (*)    MCHC 29.4 (*)    Platelets 115 (*)    All other components within normal limits  BASIC METABOLIC PANEL - Abnormal; Notable for the following components:   CO2 21 (*)    Glucose,  Bld 108 (*)    BUN 47 (*)    Creatinine, Ser 4.87 (*)    Calcium 7.8 (*)    GFR calc non Af Amer 8 (*)    GFR calc Af Amer 9 (*)    All other components within normal limits  GLUCOSE, CAPILLARY - Abnormal; Notable for the following components:   Glucose-Capillary 194 (*)    All other components within normal limits  GLUCOSE, CAPILLARY - Abnormal; Notable for the following components:   Glucose-Capillary 272 (*)    All other components within normal limits  GLUCOSE, CAPILLARY - Abnormal; Notable for the following components:   Glucose-Capillary 224 (*)    All other components within normal limits  GLUCOSE, CAPILLARY - Abnormal; Notable for the following components:   Glucose-Capillary 134 (*)    All other components within  normal limits  CULTURE, BLOOD (ROUTINE X 2)  CULTURE, BLOOD (ROUTINE X 2)  GRAM STAIN  CULTURE, BODY FLUID-BOTTLE  LIPASE, BLOOD  LACTIC ACID, PLASMA  AMMONIA  PHOSPHORUS  GLUCOSE, CAPILLARY  CBC  RENAL FUNCTION PANEL    EKG EKG Interpretation  Date/Time:  Friday September 13 2018 14:02:39 EDT Ventricular Rate:  77 PR Interval:    QRS Duration: 110 QT Interval:  406 QTC Calculation: 460 R Axis:     Text Interpretation:  Sinus rhythm Borderline prolonged PR interval Low voltage, extremity and precordial leads When compared to prior, no signifiant changes seen,  No sTEMI Confirmed by Antony Blackbird [phone]) on 09/13/2018 5:09:23 PM   Radiology Ct Abdomen Pelvis Wo Contrast  Result Date: 09/13/2018 CLINICAL DATA:  Acute right-sided abdominal pain. EXAM: CT ABDOMEN AND PELVIS WITHOUT CONTRAST TECHNIQUE: Multidetector CT imaging of the abdomen and pelvis was performed following the standard protocol without IV contrast. COMPARISON:  CT scan of March 11, 2017. FINDINGS: Lower chest: Mild bilateral pleural effusions are noted with adjacent subsegmental atelectasis. Hepatobiliary: No focal liver abnormality is seen. Status post cholecystectomy. No biliary dilatation. Pancreas: Stable pancreatic calcifications are noted consistent with chronic pancreatitis. No acute inflammation or ductal dilatation is noted. Spleen: Normal in size without focal abnormality. Adrenals/Urinary Tract: Adrenal glands appear normal. Stable right renal cyst is noted. Bilateral renal atrophy is noted consistent with history of end-stage renal disease. No hydronephrosis or renal obstruction is noted. No renal or ureteral calculi are noted. Urinary bladder is unremarkable. Stomach/Bowel: The appendix appears unremarkable. There is no evidence of bowel obstruction. Sigmoid diverticulosis is noted without inflammation. Wall thickening of gastric antrum is noted which may represent peptic ulcer disease. Vascular/Lymphatic:  Aortic atherosclerosis. No enlarged abdominal or pelvic lymph nodes. Reproductive: Status post hysterectomy. No adnexal masses. Other: Mild pneumoperitoneum is noted in the epigastric region which most likely is related to peritoneal dialysis. Distal tip of dialysis catheter is seen in the pelvis. Musculoskeletal: No acute or significant osseous findings. IMPRESSION: Mild pneumoperitoneum is noted in epigastric region which most likely is related to peritoneal dialysis. Mild wall thickening of gastric antrum is noted which may represent peptic ulcer disease. Clinical correlation and endoscopy may be performed for further evaluation. Stable pancreatic calcifications are noted consistent with chronic pancreatitis. Mild bilateral pleural effusions are noted with adjacent subsegmental atelectasis. Aortic Atherosclerosis (ICD10-I70.0). Electronically Signed   By: Marijo Conception M.D.   On: 09/13/2018 14:21    Procedures Procedures (including critical care time)  Medications Ordered in ED Medications  gentamicin cream (GARAMYCIN) 0.1 % 1 application (1 application Topical Given 09/14/18 1007)  dialysis solution 1.5% low-MG/low-CA  dianeal solution (has no administration in time range)  dialysis solution 1.5% low-MG/low-CA dianeal solution (has no administration in time range)  heparin 1,500 Units in dialysis solution 1.5% low-MG/low-CA 3,000 mL dialysis solution (has no administration in time range)  aspirin EC tablet 81 mg (81 mg Oral Given 09/14/18 0819)  atorvastatin (LIPITOR) tablet 40 mg (40 mg Oral Given 09/14/18 1720)  carvedilol (COREG) tablet 3.125 mg (3.125 mg Oral Given 09/14/18 1719)  citalopram (CELEXA) tablet 10 mg (10 mg Oral Given 09/14/18 0819)  traZODone (DESYREL) tablet 50 mg (50 mg Oral Given 09/14/18 2126)  levothyroxine (SYNTHROID) tablet 100 mcg (100 mcg Oral Given 09/15/18 0621)  linagliptin (TRADJENTA) tablet 5 mg (5 mg Oral Given 09/14/18 0820)  docusate sodium (COLACE) capsule 100 mg  (100 mg Oral Given 09/14/18 1204)  lipase/protease/amylase (CREON) capsule 48,000 Units (48,000 Units Oral Given 09/14/18 1720)  pantoprazole (PROTONIX) EC tablet 40 mg (40 mg Oral Given 09/14/18 0820)  polyethylene glycol (MIRALAX / GLYCOLAX) packet 17 g (17 g Oral Given 09/14/18 1006)  sevelamer carbonate (RENVELA) tablet 2,400 mg (2,400 mg Oral Given 09/14/18 1720)  clopidogrel (PLAVIX) tablet 75 mg (75 mg Oral Given 09/14/18 1006)  levETIRAcetam (KEPPRA) tablet 250 mg (250 mg Oral Given 09/14/18 1720)  feeding supplement (PRO-STAT SUGAR FREE 64) liquid 30 mL (30 mLs Oral Given 09/14/18 2126)  multivitamin (PROSIGHT) tablet 1 tablet (1 tablet Oral Given 09/14/18 1006)  feeding supplement (NEPRO CARB STEADY) liquid 237 mL (237 mLs Oral New Bag/Given 09/14/18 1204)  prednisoLONE acetate (PRED FORTE) 1 % ophthalmic suspension 1 drop (1 drop Both Eyes Given 09/14/18 2126)  HYDROcodone-acetaminophen (NORCO/VICODIN) 5-325 MG per tablet 1-2 tablet (2 tablets Oral Given 09/14/18 1832)  polyethylene glycol (MIRALAX / GLYCOLAX) packet 17 g (17 g Oral Given 09/14/18 1832)  ondansetron (ZOFRAN) tablet 4 mg (4 mg Oral Given 09/14/18 0036)    Or  ondansetron (ZOFRAN) injection 4 mg ( Intravenous See Alternative 09/14/18 0036)  insulin aspart (novoLOG) injection 0-9 Units (0 Units Subcutaneous Not Given 09/14/18 1631)  heparin injection 5,000 Units (5,000 Units Subcutaneous Given 09/15/18 0621)  ondansetron (ZOFRAN) injection 4 mg (4 mg Intravenous Given 09/13/18 1409)  sodium chloride 0.9 % bolus 500 mL (0 mLs Intravenous Stopped 09/13/18 1601)  fentaNYL (SUBLIMAZE) injection 25 mcg (25 mcg Intravenous Given 09/13/18 1411)  vancomycin (VANCOCIN) 1,500 mg in sodium chloride 0.9 % 500 mL IVPB (0 mg Intravenous Stopped 09/13/18 2359)  fentaNYL (SUBLIMAZE) injection 50 mcg (50 mcg Intravenous Given 09/13/18 2223)  cefTAZidime (FORTAZ) 1,667 mg in dialysis solution 1.5% low-MG/low-CA 5,000 mL dialysis solution ( Peritoneal  Dialysis New Bag/Given 09/14/18 0415)     Initial Impression / Assessment and Plan / ED Course  I have reviewed the triage vital signs and the nursing notes.  Pertinent labs & imaging results that were available during my care of the patient were reviewed by me and considered in my medical decision making (see chart for details).       Concern for possible SBP.  CT abdomen pelvis without acute findings.  Dialysis staff will obtain peritoneal fluid.  Signed out to Dr. Sherry Ruffing pending results of cell count.   Final Clinical Impressions(s) / ED Diagnoses   Final diagnoses:  Peritonitis [facility])    ED Discharge Orders    None       Julianne Rice, MD 09/15/18 [phone]

## 2018-09-13 NOTE — Consult Note (Signed)
Reason for Consult: Continuity of ESRD care, peritonitis in patient on PD Referring Physician: Marda Stalker MD (EDP)  HPI:  78 year old Caucasian woman with past medical history significant for history of diabetes mellitus, prior CVA with significant memory deficit and end-stage renal disease on peritoneal dialysis who was just discharged from the hospital 5 days ago after admission for STEMI treated with coronary angiogram and DES to proximal circumflex.  Reports that she started experiencing abdominal pain last night and this has been increasing in intensity to a point where she called the cardiologist office and was directed to the emergency room for further evaluation.  She does not recall if she called the peritoneal dialysis nurse.  She denies any chest pain or shortness of breath and when seen in the emergency room, complains of recurring abdominal pain but reports that she is hungry and would like something to eat.  She denies any nausea, vomiting or diarrhea.  She denies any melena or hematemesis.  She denies any fevers or chills.  PD fluid cell count 1100.  PD prescription: (Under the care of Dr. Justin Mend in [facility]) CCPD, EDW 56.5 kg, 2.5 calcium/0.5 magnesium dialysate with variable dextrose.  4 exchanges, 2 L fill volume, 90-minute dwell time, last fill volume 0 cc, 0 daytime exchanges, 0 daytime dwell.   Past Medical History:  Diagnosis Date  . Anemia   . Basal cell carcinoma     on nose/face  . CAD (coronary artery disease)    a. 08/2018: posterior STEMI s/p DES to LCx x2  . Depression   . ESRD on peritoneal dialysis ([place])    "q night" (11/29/2017)  . Fatty liver   . Hypertension   . Hypothyroidism   . NASH (nonalcoholic steatohepatitis)   . Seizures ([place])    ? when she may have had a stroke sometime in the spring of 2018  . Sleep apnea    diagnosed years ago "after I'd gained alot of weight;  no longer a problem since weight loss"  . Stroke [facility] 04/2016   memory  issues, lost 1/2 vision in both eyes  . Thrombocytopenia ([place])   . Thyroid disease   . Type II diabetes mellitus ([place])     Past Surgical History:  Procedure Laterality Date  . ABDOMINAL HYSTERECTOMY    . AV FISTULA PLACEMENT Right 10/30/2016   Procedure: INSERTION OF ARTERIOVENOUS (AV) GORE-TEX GRAFT RIGHT UPPER ARM;  Surgeon: Elam Dutch, MD;  Location: [place];  Service: Vascular;  Laterality: Right;  . CATARACT EXTRACTION W/ INTRAOCULAR LENS  IMPLANT, BILATERAL Bilateral   . CHOLECYSTECTOMY OPEN    . CORNEAL TRANSPLANT Bilateral   . CORONARY ANGIOGRAPHY N/A 09/06/2018   Procedure: CORONARY ANGIOGRAPHY;  Surgeon: Lorretta Harp, MD;  Location: [place] [facility];  Service: Cardiovascular;  Laterality: N/A;  . CORONARY/GRAFT ACUTE MI REVASCULARIZATION N/A 09/06/2018   Procedure: Coronary/Graft Acute MI Revascularization;  Surgeon: Lorretta Harp, MD;  Location: [place] [facility];  Service: Cardiovascular;  Laterality: N/A;  . EYE SURGERY    . KNEE ARTHROSCOPY Left   . LEFT HEART CATH AND CORONARY ANGIOGRAPHY N/A 09/06/2018   Procedure: LEFT HEART CATH AND CORONARY ANGIOGRAPHY;  Surgeon: Lorretta Harp, MD;  Location: [place] [facility];  Service: Cardiovascular;  Laterality: N/A;  . MOHS SURGERY     "nose"  . SHOULDER OPEN ROTATOR CUFF REPAIR Left   . THROMBECTOMY AND REVISION OF ARTERIOVENTOUS (AV) GORETEX  GRAFT Right 12/04/2016   Procedure: THROMBECTOMY/  REVISION OF RIGHT UPPER ARM ARTERIOVENOUS GORETEX GRAFT;  Surgeon: Elam Dutch, MD;  Location: [place];  Service: Vascular;  Laterality: Right;  . TONSILLECTOMY      History reviewed. No pertinent family history.  Social History:  reports that she has never smoked. She has quit using smokeless tobacco.  Her smokeless tobacco use included snuff. She reports that she does not drink alcohol or use drugs.  Allergies:  Allergies  Allergen Reactions  . Contrast Media [Iodinated Diagnostic Agents] Swelling    SWELLING  REACTION UNSPECIFIED   . Tape Other (See Comments)    Tears skin - please use paper tape    Medications:  Scheduled: . cefTAZidime  1,000 mg Intraperitoneal Once in dialysis  . gentamicin cream  1 application Topical Daily    Results for orders placed or performed during the hospital encounter of 09/13/18 (from the past 48 hour(s))  CBC with Differential/Platelet     Status: Abnormal   Collection Time: 09/13/18  1:35 PM  Result Value Ref Range   WBC 9.0 4.0 - 10.5 K/uL   RBC 4.29 3.87 - 5.11 MIL/uL   Hemoglobin 11.8 (L) 12.0 - 15.0 g/dL   HCT 39.4 36.0 - 46.0 %   MCV 91.8 80.0 - 100.0 fL   MCH 27.5 26.0 - 34.0 pg   MCHC 29.9 (L) 30.0 - 36.0 g/dL   RDW 14.5 11.5 - 15.5 %   Platelets 112 (L) 150 - 400 K/uL    Comment: REPEATED TO VERIFY Immature Platelet Fraction may be clinically indicated, consider ordering this additional test [id number] CONSISTENT WITH PREVIOUS RESULT    nRBC 0.0 0.0 - 0.2 %   Neutrophils Relative % 79 %   Neutro Abs 7.1 1.7 - 7.7 K/uL   Lymphocytes Relative 7 %   Lymphs Abs 0.7 0.7 - 4.0 K/uL   Monocytes Relative 11 %   Monocytes Absolute 1.0 0.1 - 1.0 K/uL   Eosinophils Relative 1 %   Eosinophils Absolute 0.1 0.0 - 0.5 K/uL   Basophils Relative 0 %   Basophils Absolute 0.0 0.0 - 0.1 K/uL   Immature Granulocytes 2 %   Abs Immature Granulocytes 0.14 (H) 0.00 - 0.07 K/uL    Comment: Performed at [facility], [address] [place], [place] [address]  Comprehensive metabolic panel     Status: Abnormal   Collection Time: 09/13/18  1:35 PM  Result Value Ref Range   Sodium 133 (L) 135 - 145 mmol/L   Potassium 4.3 3.5 - 5.1 mmol/L   Chloride 96 (L) 98 - 111 mmol/L   CO2 23 22 - 32 mmol/L   Glucose, Bld 98 70 - 99 mg/dL   BUN 48 (H) 8 - 23 mg/dL   Creatinine, Ser 4.76 (H) 0.44 - 1.00 mg/dL   Calcium 8.0 (L) 8.9 - 10.3 mg/dL   Total Protein 5.4 (L) 6.5 - 8.1 g/dL   Albumin 1.9 (L) 3.5 - 5.0 g/dL   AST 22 15 - 41 U/L   ALT 18 0 - 44 U/L    Alkaline Phosphatase 132 (H) 38 - 126 U/L   Total Bilirubin 0.6 0.3 - 1.2 mg/dL   GFR calc non Af Amer 8 (L) >60 mL/min   GFR calc Af Amer 9 (L) >60 mL/min   Anion gap 14 5 - 15    Comment: Performed at [facility], [place] [address], [place], [place] [address]  Lipase, blood     Status: None  Collection Time: 09/13/18  1:35 PM  Result Value Ref Range   Lipase 17 11 - 51 U/L    Comment: Performed at [facility], [place] [address], [place], [place] [address]  Lactic acid, plasma     Status: None   Collection Time: 09/13/18  2:03 PM  Result Value Ref Range   Lactic Acid, Venous 1.5 0.5 - 1.9 mmol/L    Comment: Performed at [facility] [address], [place], [place] [address]  Ammonia     Status: None   Collection Time: 09/13/18  2:04 PM  Result Value Ref Range   Ammonia 24 9 - 35 umol/L    Comment: Performed at [facility], [place] [address], [place], [place] [address]  Urinalysis, Routine w reflex microscopic     Status: Abnormal   Collection Time: 09/13/18  3:54 PM  Result Value Ref Range   Color, Urine YELLOW YELLOW   APPearance CLEAR CLEAR   Specific Gravity, Urine 1.008 1.005 - 1.030   pH 9.0 (H) 5.0 - 8.0   Glucose, UA 150 (A) NEGATIVE mg/dL   Hgb urine dipstick NEGATIVE NEGATIVE   Bilirubin Urine NEGATIVE NEGATIVE   Ketones, ur NEGATIVE NEGATIVE mg/dL   Protein, ur 30 (A) NEGATIVE mg/dL   Nitrite NEGATIVE NEGATIVE   Leukocytes,Ua NEGATIVE NEGATIVE   RBC / HPF 0-5 0 - 5 RBC/hpf   WBC, UA 0-5 0 - 5 WBC/hpf   Bacteria, UA RARE (A) NONE SEEN   Squamous Epithelial / LPF 6-10 0 - 5    Comment: Performed at [facility], [place] [address] [place], [place] [address]  Gram stain     Status: None   Collection Time: 09/13/18  4:56 PM  Result Value Ref Range   Specimen Description FLUID PERITONEAL    Special Requests NONE    Gram Stain      ABUNDANT WBC PRESENT,BOTH PMN AND MONONUCLEAR NO ORGANISMS SEEN Performed at [facility], [place]  [address], [place], [place] [address]    Report Status 09/13/2018 FINAL   Body fluid cell count with differential     Status: Abnormal   Collection Time: 09/13/18  6:40 PM  Result Value Ref Range   Fluid Type-FCT PERITONEAL DIALYSATE     Comment: FLUID CORRECTED ON 04/17 AT 1904: PREVIOUSLY REPORTED AS Peritoneal    Color, Fluid STRAW (A) YELLOW   Appearance, Fluid HAZY (A) CLEAR   WBC, Fluid 1,133 (H) 0 - 1,000 cu mm   Neutrophil Count, Fluid 91 (H) 0 - 25 %   Lymphs, Fluid 3 %   Monocyte-Macrophage-Serous Fluid 6 (L) 50 - 90 %   Eos, Fluid 0 %   Other Cells, Fluid 0 %    Comment: Performed at [facility], [place] [address], [place], [place] [address]    Ct Abdomen Pelvis Wo Contrast  Result Date: 09/13/2018 CLINICAL DATA:  Acute right-sided abdominal pain. EXAM: CT ABDOMEN AND PELVIS WITHOUT CONTRAST TECHNIQUE: Multidetector CT imaging of the abdomen and pelvis was performed following the standard protocol without IV contrast. COMPARISON:  CT scan of March 11, 2017. FINDINGS: Lower chest: Mild bilateral pleural effusions are noted with adjacent subsegmental atelectasis. Hepatobiliary: No focal liver abnormality is seen. Status post cholecystectomy. No biliary dilatation. Pancreas: Stable pancreatic calcifications are noted consistent with chronic pancreatitis. No acute inflammation or ductal dilatation is noted. Spleen: Normal in size without focal abnormality. Adrenals/Urinary Tract: Adrenal glands appear normal. Stable  right renal cyst is noted. Bilateral renal atrophy is noted consistent with history of end-stage renal disease. No hydronephrosis or renal obstruction is noted. No renal or ureteral calculi are noted. Urinary bladder is unremarkable. Stomach/Bowel: The appendix appears unremarkable. There is no evidence of bowel obstruction. Sigmoid diverticulosis is noted without inflammation. Wall thickening of gastric antrum is noted which may represent peptic ulcer disease.  Vascular/Lymphatic: Aortic atherosclerosis. No enlarged abdominal or pelvic lymph nodes. Reproductive: Status post hysterectomy. No adnexal masses. Other: Mild pneumoperitoneum is noted in the epigastric region which most likely is related to peritoneal dialysis. Distal tip of dialysis catheter is seen in the pelvis. Musculoskeletal: No acute or significant osseous findings. IMPRESSION: Mild pneumoperitoneum is noted in epigastric region which most likely is related to peritoneal dialysis. Mild wall thickening of gastric antrum is noted which may represent peptic ulcer disease. Clinical correlation and endoscopy may be performed for further evaluation. Stable pancreatic calcifications are noted consistent with chronic pancreatitis. Mild bilateral pleural effusions are noted with adjacent subsegmental atelectasis. Aortic Atherosclerosis (ICD10-I70.0). Electronically Signed   By: Marijo Conception M.D.   On: 09/13/2018 14:21    Review of Systems  Constitutional: Positive for malaise/fatigue. Negative for chills and fever.  HENT: Negative.   Eyes: Negative.   Respiratory: Negative.   Cardiovascular: Negative.   Gastrointestinal: Positive for abdominal pain.  Genitourinary: Negative.   Musculoskeletal: Negative.   Skin: Negative.    Blood pressure (!) 115/58, pulse 68, temperature 99.1 F (37.3 C), temperature source Oral, resp. rate 15, height 5' 1"  (1.549 m), weight 57.6 kg, SpO2 95 %. Physical Exam  Nursing note and vitals reviewed. Constitutional: She appears well-developed and well-nourished. No distress.  HENT:  Head: Normocephalic.  Mouth/Throat: Oropharynx is clear and moist.  Eyes: Pupils are equal, round, and reactive to light. Conjunctivae and EOM are normal. No scleral icterus.  Neck: Normal range of motion. Neck supple. No JVD present.  Cardiovascular: Normal rate, regular rhythm and normal heart sounds.  No murmur heard. Respiratory: Effort normal and breath sounds normal. She has  no wheezes. She has no rales.  GI: Soft. There is abdominal tenderness. There is guarding.  She has some diffuse tenderness with intermittent guarding  Musculoskeletal:        General: No edema.     Comments: Thrombosed right upper arm AVG  Neurological: She is alert.  Poorly oriented  Skin: Skin is warm. No rash noted. No erythema.    Assessment/Plan: 1.  Peritonitis in patient on peritoneal dialysis: Recommend overnight admission/observation for pain management and initiation of antimicrobial therapy as this cannot be satisfactorily accomplished as an outpatient at this time.  I have ordered for IP Fortaz to complement her intravenous vancomycin as we await Gram stain and cultures which will further be useful in tailoring antimicrobial therapy as an outpatient (can all be done via IP route).  In my brief conversation with her, I suspect that she has significant memory deficits and will need intensive retraining on PD. 2.  End-stage renal disease: Peritoneal dialysis orders placed. 3.  Recent ST elevation MI: Resume Plavix along her other medications. 4.  Hypertension: Blood pressure on lower side, will use 1.5% dextrose peritoneal dialysate solution.  No evidence of edema/volume excess. 5.  Anemia of chronic disease: Hemoglobin/hematocrit currently at goal-surprisingly 2 g higher than what it was 5 days ago. 6.  Secondary hyperparathyroidism: Low phosphorus level 5 days ago, calcium level currently acceptable (corrects to 9.6).  Will reconcile PTH lowering  medications.   Jory Welke K. 09/13/2018, 9:18 PM

## 2018-09-13 NOTE — ED Notes (Signed)
Call received from [facility] who states the room was not available at this time. Says the room was still dirty.

## 2018-09-13 NOTE — H&P (Addendum)
[facility] Admission History and Physical Service Pager: [phone]  Patient name: Samantha Conrad Medical record number: [id number] Date of birth: [birth year] Age: 78 y.o. Gender: female  Primary Care Provider: Physicians, Di Kindle Family Consultants: Nephrology Code Status:   Code Status: Partial Code  Chief Complaint: RLQ abdominal pain  Assessment and Plan: Samantha Conrad is a 78 y.o. female presenting with SBP in setting of peritoneal peritonitis. PMH is significant for  has a past medical history of Anemia, Basal cell carcinoma, CAD (coronary artery disease), Depression, ESRD on peritoneal dialysis ([place]), Fatty liver, Hypertension, Hypothyroidism, NASH (nonalcoholic steatohepatitis), Seizures ([place]), Sleep apnea, Stroke ([place]) (04/2016), Thrombocytopenia ([place]), Thyroid disease, and Type II diabetes mellitus ([place]). history of SBP.   #Right lower abdominal pain secondary to spontaneous bacterial peritonitis in setting of peritoneal dialysis Laboratory findings with elevated WBCs consistent with SBP.  Evaluated by nephrology in the ED.  Plan to start broad-spectrum antibiotics vancomycin and Fortaz.  Remainder of labs are consistent with end-stage renal disease with elevated creatinine and mild electrolyte abnormalities.  Overall patient appears stable.  Gram stain negative, cultures drawn.  We will admit with continued antibiotic therapy and guidance from nephrology.  Admit to FPTS1, MedSurg, attending Dr. Ardelia Conrad  Nephrology consulted, appreciate recommendations  Continue IV vancomycin and intraperitoneal Fortaz  Follow-up blood in peritoneal cultures, with likely plan to redraw in 3 to 5 days  Strict I's and O's  Vital signs per floor  Oxycodone prn pain  #CAD #Recent Stemi in 08/2018 with DES placed in the LCx No active chest pain or shortness of breath to suspect cardiac cause of symptoms.  This put up with EKG as well. Of note patient was  discharged home recently on losartan, however appears that patient is no longer taking this at home.  Continue home medical management including ASA, atorvastatin, Coreg, Plavix, Lasix  Appreciate nephrology recommendations regarding current losartan in the setting of elevated creatinine  #Hypertension Normotensive on admission.  Of note patient was discharged home recently on losartan, however appears that patient is no longer taking this at home.  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  Has had to be on Brilinta in the past, however this was held due to thrombo-cytopenia  Continue home ASA, Plavix, atorvastatin #Hypothyroidism TSH was within normal limits.  Continue home Synthroid  #DMT2 A1c in 08/2018 was 5.7.    Continue home Tradjenta,  Hold home glipizide  Sliding scale insulin sensitive plus CBG checks every 4 hour  #Seizure  Continue home Keppra  #GERD  Continue home Protonix  #Anxiety/depression  Continue home citalopram  #Insomnia  Continue home trazodone  #FEN/GI:  Fluid restricted carb modified diet,  continue home nutritional supplements  Prophylaxis: Heparin per pharmacy for VTE prophylaxis  Disposition: Inpatient  History of Present Illness:  Samantha Conrad is a 78 y.o. female presenting with 2 days of right lower quadrant abdominal pain.  It has been slowly getting worse.  Does not radiate anywhere.  Describes it as a pressure.  Touching it makes it worse.  Nothing makes it better.  Is associated with fevers, diaphoresis, nausea.  The pain is constant.  It is severe and 10/10 pain.  This pain is similar to but worse  than pain she has had in the past for  spontaneous bacterial peritonitis.  The patient does make some urine.  Her last PD session was yesterday evening.  She reports taking all her medicines.  She reports being hungry.  The patient went to her PCP today for evaluation and was sent to the emergency department.  In the ED, patient was afebrile with stable vital signs.  Labs were significant for hyponatremia to 130, creatinine 4.8, alk phos 132, hemoglobin 11.8.  EKG was similar to prior EKG.  CT abdomen was concerning for mild wall thickening of gastric antrum concerning for possible peptic ulcer disease, mild pneumonitis, chronic pancreatitis, bilateral pleural effusions with atelectasis.  Peritoneal fluid was drawn off that showed 1100 the BCs with high PMN burden.  Gram stain was negative.  Nephrology was consulted.  They recommended admission with administration of vancomycin and intraperitoneal Tressie Ellis.  Family medicine was consulted for admission.  Review Of Systems: Per HPI with the following additions:   Review of Systems  Constitutional: Positive for chills, diaphoresis and fever.  HENT: Negative for congestion.   Respiratory: Negative for cough and shortness of breath.   Cardiovascular: Negative for chest pain and leg swelling.  Gastrointestinal: Positive for abdominal pain and nausea. Negative for blood in stool, constipation, diarrhea, melena and vomiting.  Genitourinary: Negative for dysuria and frequency.  Musculoskeletal: Negative for back pain.  Skin: Negative for rash.  Neurological: Negative for weakness and headaches.    Patient Active Problem List   Diagnosis Date Noted  . CAD (coronary artery disease)   . Anemia   . Thrombocytopenia ([place])   . Acute ST elevation myocardial infarction (STEMI) of posterior wall ([place]) 09/06/2018  . Symptomatic anemia 03/11/2017  . Diet-controlled diabetes mellitus ([place]) 03/11/2017  . NASH (nonalcoholic steatohepatitis) 03/11/2017  . Hypertension 03/11/2017  . Hypothyroidism  03/11/2017  . ESRD on dialysis ([place]) 03/11/2017  . Seizure disorder 03/11/2017  . Abdominal pain 03/11/2017  . HLD (hyperlipidemia) 03/11/2017  . Chronic pancreatitis ([place]) 03/11/2017  . Constipation 03/11/2017  . Laryngopharyngeal reflux (LPR) 02/17/2015  . H/O Mild allergic rhinitis 02/17/2015  . Cirrhosis ([place]) 02/17/2015    Past Medical History: Past Medical History:  Diagnosis Date  . Anemia   . Basal cell carcinoma     on nose/face  . CAD (coronary artery disease)    a. 08/2018: posterior STEMI s/p DES to LCx x2  . Depression   . ESRD on peritoneal dialysis ([place])    "q night" (11/29/2017)  . Fatty liver   . Hypertension   . Hypothyroidism   . NASH (nonalcoholic steatohepatitis)   . Seizures ([place])    ? when she may have had a stroke sometime in the spring of 2018  . Sleep apnea    diagnosed years ago "after I'd gained alot of weight;  no longer a problem since weight loss"  . Stroke [facility]) 04/2016   memory issues, lost 1/2 vision in both eyes  . Thrombocytopenia ([place])   . Thyroid disease   . Type II diabetes mellitus ([place])     Past Surgical History: Past Surgical History:  Procedure Laterality Date  . ABDOMINAL HYSTERECTOMY    . AV FISTULA PLACEMENT Right 10/30/2016   Procedure: INSERTION OF ARTERIOVENOUS (AV) GORE-TEX GRAFT RIGHT UPPER ARM;  Surgeon: Elam Dutch, MD;  Location: [facility];  Service: Vascular;  Laterality: Right;  . CATARACT EXTRACTION W/ INTRAOCULAR LENS  IMPLANT, BILATERAL Bilateral   . CHOLECYSTECTOMY OPEN    .  CORNEAL TRANSPLANT Bilateral   . CORONARY ANGIOGRAPHY N/A 09/06/2018   Procedure: CORONARY ANGIOGRAPHY;  Surgeon: Lorretta Harp, MD;  Location: [facility];  Service: Cardiovascular;  Laterality: N/A;  . CORONARY/GRAFT ACUTE MI REVASCULARIZATION N/A 09/06/2018   Procedure: Coronary/Graft Acute MI Revascularization;  Surgeon: Lorretta Harp, MD;  Location: [facility];  Service: Cardiovascular;  Laterality: N/A;  . EYE  SURGERY    . KNEE ARTHROSCOPY Left   . LEFT HEART CATH AND CORONARY ANGIOGRAPHY N/A 09/06/2018   Procedure: LEFT HEART CATH AND CORONARY ANGIOGRAPHY;  Surgeon: Lorretta Harp, MD;  Location: [facility];  Service: Cardiovascular;  Laterality: N/A;  . MOHS SURGERY     "nose"  . SHOULDER OPEN ROTATOR CUFF REPAIR Left   . THROMBECTOMY AND REVISION OF ARTERIOVENTOUS (AV) GORETEX  GRAFT Right 12/04/2016   Procedure: THROMBECTOMY/ REVISION OF RIGHT UPPER ARM ARTERIOVENOUS GORETEX GRAFT;  Surgeon: Elam Dutch, MD;  Location: [facility];  Service: Vascular;  Laterality: Right;  . TONSILLECTOMY      Social History: Social History   Tobacco Use  . Smoking status: Never Smoker  . Smokeless tobacco: Former Systems developer    Types: Snuff  . Tobacco comment: "no snuff since < 1980s"  Substance Use Topics  . Alcohol use: Never    Frequency: Never  . Drug use: Never   Additional social history: Family prepares all of her medications and helps her at home. Please also refer to relevant sections of EMR.  Family History: History reviewed. No pertinent family history.   Allergies and Medications: Allergies  Allergen Reactions  . Contrast Media [Iodinated Diagnostic Agents] Swelling    SWELLING REACTION UNSPECIFIED   . Tape Other (See Comments)    Tears skin - please use paper tape   No current facility-administered medications on file prior to encounter.    Current Outpatient Medications on File Prior to Encounter  Medication Sig Dispense Refill  . acetaminophen-codeine (TYLENOL #3) 300-30 MG tablet Take 1-2 tablets by mouth every 6 (six) hours as needed for moderate pain.    . Amino Acids-Protein Hydrolys (FEEDING SUPPLEMENT, PRO-STAT SUGAR FREE 64,) LIQD Take 30 mLs by mouth 2 (two) times daily. 900 mL 0  . aspirin EC 81 MG tablet Take 81 mg by mouth daily with breakfast.    . atorvastatin (LIPITOR) 40 MG tablet Take 1 tablet (40 mg total) by mouth daily at 6 PM. (Patient taking  differently: Take 40 mg by mouth daily with supper. ) 90 tablet 4  . carvedilol (COREG) 3.125 MG tablet Take 1 tablet (3.125 mg total) by mouth 2 (two) times daily with a meal. 60 tablet 6  . citalopram (CELEXA) 10 MG tablet Take 10 mg by mouth daily with breakfast.   1  . clopidogrel (PLAVIX) 75 MG tablet Take 1 tablet (75 mg total) by mouth daily. (Patient taking differently: Take 75 mg by mouth daily with breakfast. ) 90 tablet 4  . docusate sodium (COLACE) 100 MG capsule Take 100 mg by mouth daily with lunch.    . furosemide (LASIX) 40 MG tablet Take 40 mg by mouth 2 (two) times daily with breakfast and lunch.     Marland Kitchen glipiZIDE (GLUCOTROL) 5 MG tablet Take 5 mg by mouth daily before breakfast.     . levETIRAcetam (KEPPRA) 250 MG tablet Take 250 mg by mouth 2 (two) times daily with a meal.     . levothyroxine (SYNTHROID, LEVOTHROID) 100 MCG tablet Take  100 mcg by mouth daily before breakfast.   0  . linagliptin (TRADJENTA) 5 MG TABS tablet Take 5 mg by mouth daily with breakfast.     . lipase/protease/amylase (CREON) 12000 units CPEP capsule Take 48,000 Units by mouth 3 (three) times daily with meals.    . midodrine (PROAMATINE) 5 MG tablet Take 5 mg by mouth daily as needed (SBP <110).     . multivitamin (PROSIGHT) TABS tablet Take 1 tablet by mouth daily. (Patient taking differently: Take 1 tablet by mouth daily with breakfast. ) 30 each 0  . nitroGLYCERIN (NITROSTAT) 0.4 MG SL tablet Place 0.4 mg under the tongue every 5 (five) minutes as needed for chest pain.    . Nutritional Supplements (FEEDING SUPPLEMENT, NEPRO CARB STEADY,) LIQD Take 237 mLs by mouth daily. (Patient taking differently: Take 237 mLs by mouth daily with lunch. )  0  . pantoprazole (PROTONIX) 40 MG tablet Take 40 mg by mouth daily with breakfast.     . polyethylene glycol (MIRALAX / GLYCOLAX) packet Take 17 g by mouth daily.     . potassium chloride SA (K-DUR,KLOR-CON) 20 MEQ tablet Take 1 tablet (20 mEq total) by mouth 2  (two) times daily. (Patient taking differently: Take 20 mEq by mouth daily with breakfast. ) 60 tablet 0  . prednisoLONE acetate (PRED FORTE) 1 % ophthalmic suspension Place 1 drop into both eyes at bedtime.     . sevelamer carbonate (RENVELA) 800 MG tablet Take 2,400 mg by mouth 3 (three) times daily with meals.     . traZODone (DESYREL) 50 MG tablet Take 50 mg by mouth at bedtime.    Marland Kitchen aspirin 81 MG chewable tablet Chew 1 tablet (81 mg total) by mouth daily. (Patient not taking: Reported on 09/13/2018)    . calcitRIOL (ROCALTROL) 0.25 MCG capsule Take 1 capsule (0.25 mcg total) by mouth daily. (Patient not taking: Reported on 06/11/2018) 30 capsule 0  . calcium acetate (PHOSLO) 667 MG capsule Take 2 capsules (1,334 mg total) by mouth 3 (three) times daily with meals. (Patient not taking: Reported on 06/11/2018) 180 capsule 0  . losartan (COZAAR) 25 MG tablet Take 1 tablet (25 mg total) by mouth daily. (Patient not taking: Reported on 09/13/2018) 30 tablet 6    Objective: BP (!) 115/58   Pulse 68   Temp 99.1 F (37.3 C) (Oral)   Resp 15   Ht 5' 1" (1.549 m)   Wt 57.6 kg   SpO2 95%   BMI 24.00 kg/m  Exam: Physical Exam  Constitutional: She is oriented to person, place, and time. No distress.  HENT:  Head: Normocephalic and atraumatic.  Mouth/Throat: No oropharyngeal exudate.  Eyes: Pupils are equal, round, and reactive to light. EOM are normal. Right eye exhibits no discharge. Left eye exhibits no discharge. No scleral icterus.  Neck: No JVD present.  Cardiovascular: Normal rate, regular rhythm and normal heart sounds. Exam reveals no gallop and no friction rub.  No murmur heard. Pulmonary/Chest: Effort normal and breath sounds normal. No respiratory distress. She has no wheezes. She exhibits no tenderness.  Abdominal: Soft. She exhibits distension. She exhibits no mass. Bowel sounds are hypoactive. There is abdominal tenderness in the right lower quadrant. There is rebound and guarding.  There is no rigidity, no CVA tenderness, no tenderness at McBurney's point and negative Murphy's sign.  Musculoskeletal:        General: No tenderness, deformity or edema.  Neurological: She is alert and oriented to person,  place, and time. She exhibits normal muscle tone.  Skin: Skin is warm and dry. No rash noted. She is not diaphoretic. No erythema. No pallor.  Psychiatric: Mood, affect and judgment normal.  Nursing note and vitals reviewed.    Labs and Imaging:  EKG: SR w/ nonspecific twave changes  Results for orders placed or performed during the hospital encounter of 09/13/18 (from the past 24 hour(s))  CBC with Differential/Platelet     Status: Abnormal   Collection Time: 09/13/18  1:35 PM  Result Value Ref Range   WBC 9.0 4.0 - 10.5 K/uL   RBC 4.29 3.87 - 5.11 MIL/uL   Hemoglobin 11.8 (L) 12.0 - 15.0 g/dL   HCT 39.4 36.0 - 46.0 %   MCV 91.8 80.0 - 100.0 fL   MCH 27.5 26.0 - 34.0 pg   MCHC 29.9 (L) 30.0 - 36.0 g/dL   RDW 14.5 11.5 - 15.5 %   Platelets 112 (L) 150 - 400 K/uL   nRBC 0.0 0.0 - 0.2 %   Neutrophils Relative % 79 %   Neutro Abs 7.1 1.7 - 7.7 K/uL   Lymphocytes Relative 7 %   Lymphs Abs 0.7 0.7 - 4.0 K/uL   Monocytes Relative 11 %   Monocytes Absolute 1.0 0.1 - 1.0 K/uL   Eosinophils Relative 1 %   Eosinophils Absolute 0.1 0.0 - 0.5 K/uL   Basophils Relative 0 %   Basophils Absolute 0.0 0.0 - 0.1 K/uL   Immature Granulocytes 2 %   Abs Immature Granulocytes 0.14 (H) 0.00 - 0.07 K/uL  Comprehensive metabolic panel     Status: Abnormal   Collection Time: 09/13/18  1:35 PM  Result Value Ref Range   Sodium 133 (L) 135 - 145 mmol/L   Potassium 4.3 3.5 - 5.1 mmol/L   Chloride 96 (L) 98 - 111 mmol/L   CO2 23 22 - 32 mmol/L   Glucose, Bld 98 70 - 99 mg/dL   BUN 48 (H) 8 - 23 mg/dL   Creatinine, Ser 4.76 (H) 0.44 - 1.00 mg/dL   Calcium 8.0 (L) 8.9 - 10.3 mg/dL   Total Protein 5.4 (L) 6.5 - 8.1 g/dL   Albumin 1.9 (L) 3.5 - 5.0 g/dL   AST 22 15 - 41 U/L    ALT 18 0 - 44 U/L   Alkaline Phosphatase 132 (H) 38 - 126 U/L   Total Bilirubin 0.6 0.3 - 1.2 mg/dL   GFR calc non Af Amer 8 (L) >60 mL/min   GFR calc Af Amer 9 (L) >60 mL/min   Anion gap 14 5 - 15  Lipase, blood     Status: None   Collection Time: 09/13/18  1:35 PM  Result Value Ref Range   Lipase 17 11 - 51 U/L  Lactic acid, plasma     Status: None   Collection Time: 09/13/18  2:03 PM  Result Value Ref Range   Lactic Acid, Venous 1.5 0.5 - 1.9 mmol/L  Ammonia     Status: None   Collection Time: 09/13/18  2:04 PM  Result Value Ref Range   Ammonia 24 9 - 35 umol/L  Urinalysis, Routine w reflex microscopic     Status: Abnormal   Collection Time: 09/13/18  3:54 PM  Result Value Ref Range   Color, Urine YELLOW YELLOW   APPearance CLEAR CLEAR   Specific Gravity, Urine 1.008 1.005 - 1.030   pH 9.0 (H) 5.0 - 8.0   Glucose, UA 150 (A) NEGATIVE mg/dL  Hgb urine dipstick NEGATIVE NEGATIVE   Bilirubin Urine NEGATIVE NEGATIVE   Ketones, ur NEGATIVE NEGATIVE mg/dL   Protein, ur 30 (A) NEGATIVE mg/dL   Nitrite NEGATIVE NEGATIVE   Leukocytes,Ua NEGATIVE NEGATIVE   RBC / HPF 0-5 0 - 5 RBC/hpf   WBC, UA 0-5 0 - 5 WBC/hpf   Bacteria, UA RARE (A) NONE SEEN   Squamous Epithelial / LPF 6-10 0 - 5  Gram stain     Status: None   Collection Time: 09/13/18  4:56 PM  Result Value Ref Range   Specimen Description FLUID PERITONEAL    Special Requests NONE    Gram Stain      ABUNDANT WBC PRESENT,BOTH PMN AND MONONUCLEAR NO ORGANISMS SEEN Performed at [facility], [place] [address], [place], [place] [address]    Report Status 09/13/2018 FINAL   Body fluid cell count with differential     Status: Abnormal   Collection Time: 09/13/18  6:40 PM  Result Value Ref Range   Fluid Type-FCT PERITONEAL DIALYSATE    Color, Fluid STRAW (A) YELLOW   Appearance, Fluid HAZY (A) CLEAR   WBC, Fluid 1,133 (H) 0 - 1,000 cu mm   Neutrophil Count, Fluid 91 (H) 0 - 25 %   Lymphs, Fluid 3 %    Monocyte-Macrophage-Serous Fluid 6 (L) 50 - 90 %   Eos, Fluid 0 %   Other Cells, Fluid 0 %   Ct Abdomen Pelvis Wo Contrast  Result Date: 09/13/2018 CLINICAL DATA:  Acute right-sided abdominal pain. EXAM: CT ABDOMEN AND PELVIS WITHOUT CONTRAST TECHNIQUE: Multidetector CT imaging of the abdomen and pelvis was performed following the standard protocol without IV contrast. COMPARISON:  CT scan of March 11, 2017. FINDINGS: Lower chest: Mild bilateral pleural effusions are noted with adjacent subsegmental atelectasis. Hepatobiliary: No focal liver abnormality is seen. Status post cholecystectomy. No biliary dilatation. Pancreas: Stable pancreatic calcifications are noted consistent with chronic pancreatitis. No acute inflammation or ductal dilatation is noted. Spleen: Normal in size without focal abnormality. Adrenals/Urinary Tract: Adrenal glands appear normal. Stable right renal cyst is noted. Bilateral renal atrophy is noted consistent with history of end-stage renal disease. No hydronephrosis or renal obstruction is noted. No renal or ureteral calculi are noted. Urinary bladder is unremarkable. Stomach/Bowel: The appendix appears unremarkable. There is no evidence of bowel obstruction. Sigmoid diverticulosis is noted without inflammation. Wall thickening of gastric antrum is noted which may represent peptic ulcer disease. Vascular/Lymphatic: Aortic atherosclerosis. No enlarged abdominal or pelvic lymph nodes. Reproductive: Status post hysterectomy. No adnexal masses. Other: Mild pneumoperitoneum is noted in the epigastric region which most likely is related to peritoneal dialysis. Distal tip of dialysis catheter is seen in the pelvis. Musculoskeletal: No acute or significant osseous findings. IMPRESSION: Mild pneumoperitoneum is noted in epigastric region which most likely is related to peritoneal dialysis. Mild wall thickening of gastric antrum is noted which may represent peptic ulcer disease. Clinical  correlation and endoscopy may be performed for further evaluation. Stable pancreatic calcifications are noted consistent with chronic pancreatitis. Mild bilateral pleural effusions are noted with adjacent subsegmental atelectasis. Aortic Atherosclerosis (ICD10-I70.0). Electronically Signed   By: Marijo Conception M.D.   On: 09/13/2018 14:21    Bonnita Hollow, MD 09/13/2018, 9:06 PM PGY-2, [facility] pager: [phone], text pages welcome

## 2018-09-13 NOTE — ED Notes (Signed)
Per HD RN, someone has been assigned to pull fluid off cath and will be down shortly

## 2018-09-13 NOTE — Progress Notes (Signed)
Pharmacy Antibiotic Note  Samantha Conrad is a 78 y.o. female admitted on 09/13/2018 with possible peritonitis  Plan: F/U renal orders for PD and ceftaz (ok per EDP to wait for renal to order PD) Vanc IV 1500 mg x 1 Monitor PD, cx, vanc lvls Mon/Tue and redose when < 20  Height: 5' 1"  (154.9 cm) Weight: 127 lb (57.6 kg) IBW/kg (Calculated) : 47.8  Temp (24hrs), Avg:99.3 F (37.4 C), Min:99.1 F (37.3 C), Max:99.5 F (37.5 C)  Recent Labs  Lab 09/08/18 0309 09/13/18 1335 09/13/18 1403  WBC 10.5 9.0  --   CREATININE 4.50* 4.76*  --   LATICACIDVEN  --   --  1.5    Estimated Creatinine Clearance: 7.8 mL/min (A) (by C-G formula based on SCr of 4.76 mg/dL (H)).    Allergies  Allergen Reactions  . Contrast Media [Iodinated Diagnostic Agents] Swelling    SWELLING REACTION UNSPECIFIED   . Tape Other (See Comments)    Tears skin - please use paper tape   Levester Fresh, PharmD, BCPS, BCCCP Clinical Pharmacist [phone]  Please check AMION for all [facility] numbers  09/13/2018 9:02 PM

## 2018-09-13 NOTE — ED Notes (Signed)
Spoke with HD RN regarding drawing fluid off peritoneal cath; HD RN to come down to draw off asap; Dr. Lita Mains aware

## 2018-09-13 NOTE — ED Notes (Signed)
HR RN at bedside to draw peritoneal fluid

## 2018-09-14 ENCOUNTER — Other Ambulatory Visit: Payer: Self-pay

## 2018-09-14 DIAGNOSIS — K652 Spontaneous bacterial peritonitis: Principal | ICD-10-CM

## 2018-09-14 LAB — CBC
HCT: 30.6 % — ABNORMAL LOW (ref 36.0–46.0)
Hemoglobin: 9 g/dL — ABNORMAL LOW (ref 12.0–15.0)
MCH: 27.4 pg (ref 26.0–34.0)
MCHC: 29.4 g/dL — ABNORMAL LOW (ref 30.0–36.0)
MCV: 93.3 fL (ref 80.0–100.0)
Platelets: 115 10*3/uL — ABNORMAL LOW (ref 150–400)
RBC: 3.28 MIL/uL — ABNORMAL LOW (ref 3.87–5.11)
RDW: 14.5 % (ref 11.5–15.5)
WBC: 7.7 10*3/uL (ref 4.0–10.5)
nRBC: 0 % (ref 0.0–0.2)

## 2018-09-14 LAB — GLUCOSE, CAPILLARY
Glucose-Capillary: 194 mg/dL — ABNORMAL HIGH (ref 70–99)
Glucose-Capillary: 272 mg/dL — ABNORMAL HIGH (ref 70–99)
Glucose-Capillary: 95 mg/dL (ref 70–99)
Glucose-Capillary: 224 mg/dL — ABNORMAL HIGH (ref 70–99)

## 2018-09-14 LAB — BASIC METABOLIC PANEL
Anion gap: 14 (ref 5–15)
BUN: 47 mg/dL — ABNORMAL HIGH (ref 8–23)
CO2: 21 mmol/L — ABNORMAL LOW (ref 22–32)
Calcium: 7.8 mg/dL — ABNORMAL LOW (ref 8.9–10.3)
Chloride: 101 mmol/L (ref 98–111)
Creatinine, Ser: 4.87 mg/dL — ABNORMAL HIGH (ref 0.44–1.00)
GFR calc Af Amer: 9 mL/min — ABNORMAL LOW (ref >60)
GFR calc non Af Amer: 8 mL/min — ABNORMAL LOW (ref >60)
Glucose, Bld: 108 mg/dL — ABNORMAL HIGH (ref 70–99)
Potassium: 4.3 mmol/L (ref 3.5–5.1)
Sodium: 136 mmol/L (ref 135–145)

## 2018-09-14 LAB — URINE CULTURE
Culture: <10000 — AB
Special Requests: NORMAL

## 2018-09-14 LAB — PHOSPHORUS: Phosphorus: 3.6 mg/dL (ref 2.5–4.6)

## 2018-09-14 MED ORDER — DELFLEX-LC/1.5% DEXTROSE 344 MOSM/L IP SOLN
Freq: Once | INTRAVENOUS_CENTRAL | Status: AC
  Administered 2018-09-14: 04:00:00 via INTRAPERITONEAL
  Filled 2018-09-14: qty 5000

## 2018-09-14 NOTE — Progress Notes (Addendum)
[facility] Progress Note   Subjective:  Seen in room - feeling much better today. Still with abdominal pain, primarily on R side. PD finishing up - still connected to cycler at this time.   Objective Vitals:   09/13/18 2130 09/14/18 0340 09/14/18 0434 09/14/18 0903  BP:  (!) 108/54 (!) 108/58 130/63  Pulse:  62 60 74  Resp: 12 14 20 16   Temp:  (!) 97.5 F (36.4 C) (!) 97.4 F (36.3 C) 98 F (36.7 C)  TempSrc:  Oral Oral Oral  SpO2:  96% 98% 97%  Weight:  60.6 kg    Height:       Physical Exam General: Well appearing, elderly woman, NAD. Oriented and seems clear mentally. Heart: RRR; no murmur Lungs: CTA anteriorly Abdomen: mild tenderness without guarding or rebound tenderness Extremities: No LE edema Dialysis Access: PD cath in RLQ  Additional Objective Labs: Basic Metabolic Panel: Recent Labs  Lab 09/08/18 0309 09/13/18 1335 09/14/18 0412  NA 130* 133* 136  K 3.9 4.3 4.3  CL 96* 96* 101  CO2 23 23 21*  GLUCOSE 281* 98 108*  BUN 50* 48* 47*  CREATININE 4.50* 4.76* 4.87*  CALCIUM 8.5* 8.0* 7.8*  PHOS 2.3*  --   --    Liver Function Tests: Recent Labs  Lab 09/08/18 0309 09/13/18 1335  AST  --  22  ALT  --  18  ALKPHOS  --  132*  BILITOT  --  0.6  PROT  --  5.4*  ALBUMIN 1.7* 1.9*   Recent Labs  Lab 09/13/18 1335  LIPASE 17   CBC: Recent Labs  Lab 09/08/18 0309 09/13/18 1335 09/14/18 0412  WBC 10.5 9.0 7.7  NEUTROABS  --  7.1  --   HGB 9.5* 11.8* 9.0*  HCT 30.2* 39.4 30.6*  MCV 91.8 91.8 93.3  PLT 100* 112* 115*   Blood Culture    Component Value Date/Time   SDES FLUID PERITONEAL 09/13/2018 1656   SDES FLUID PERITONEAL 09/13/2018 1656   SPECREQUEST NONE 09/13/2018 1656   SPECREQUEST BOTTLES DRAWN AEROBIC AND ANAEROBIC 09/13/2018 1656   CULT  09/13/2018 1656    NO GROWTH < 12 HOURS Performed at [facility], [place] [address] [place], [place] [address]    REPTSTATUS 09/13/2018 FINAL 09/13/2018 1656   REPTSTATUS  PENDING 09/13/2018 1656   Studies/Results: Ct Abdomen Pelvis Wo Contrast  Result Date: 09/13/2018 CLINICAL DATA:  Acute right-sided abdominal pain. EXAM: CT ABDOMEN AND PELVIS WITHOUT CONTRAST TECHNIQUE: Multidetector CT imaging of the abdomen and pelvis was performed following the standard protocol without IV contrast. COMPARISON:  CT scan of March 11, 2017. FINDINGS: Lower chest: Mild bilateral pleural effusions are noted with adjacent subsegmental atelectasis. Hepatobiliary: No focal liver abnormality is seen. Status post cholecystectomy. No biliary dilatation. Pancreas: Stable pancreatic calcifications are noted consistent with chronic pancreatitis. No acute inflammation or ductal dilatation is noted. Spleen: Normal in size without focal abnormality. Adrenals/Urinary Tract: Adrenal glands appear normal. Stable right renal cyst is noted. Bilateral renal atrophy is noted consistent with history of end-stage renal disease. No hydronephrosis or renal obstruction is noted. No renal or ureteral calculi are noted. Urinary bladder is unremarkable. Stomach/Bowel: The appendix appears unremarkable. There is no evidence of bowel obstruction. Sigmoid diverticulosis is noted without inflammation. Wall thickening of gastric antrum is noted which may represent peptic ulcer disease. Vascular/Lymphatic: Aortic atherosclerosis. No enlarged abdominal or pelvic lymph nodes. Reproductive: Status post hysterectomy. No adnexal masses. Other: Mild  pneumoperitoneum is noted in the epigastric region which most likely is related to peritoneal dialysis. Distal tip of dialysis catheter is seen in the pelvis. Musculoskeletal: No acute or significant osseous findings. IMPRESSION: Mild pneumoperitoneum is noted in epigastric region which most likely is related to peritoneal dialysis. Mild wall thickening of gastric antrum is noted which may represent peptic ulcer disease. Clinical correlation and endoscopy may be performed for further  evaluation. Stable pancreatic calcifications are noted consistent with chronic pancreatitis. Mild bilateral pleural effusions are noted with adjacent subsegmental atelectasis. Aortic Atherosclerosis (ICD10-I70.0). Electronically Signed   By: Marijo Conception M.D.   On: 09/13/2018 14:21   Medications: . dialysis solution 1.5% low-MG/low-CA    . dialysis solution 1.5% low-MG/low-CA     . aspirin EC  81 mg Oral Q breakfast  . atorvastatin  40 mg Oral q1800  . carvedilol  3.125 mg Oral BID WC  . citalopram  10 mg Oral Q breakfast  . clopidogrel  75 mg Oral Daily  . docusate sodium  100 mg Oral Q lunch  . feeding supplement (NEPRO CARB STEADY)  237 mL Oral Q lunch  . feeding supplement (PRO-STAT SUGAR FREE 64)  30 mL Oral BID  . gentamicin cream  1 application Topical Daily  . heparin injection (subcutaneous)  5,000 Units Subcutaneous Q8H  . insulin aspart  0-9 Units Subcutaneous TID WC  . levETIRAcetam  250 mg Oral BID WC  . levothyroxine  100 mcg Oral Q0600  . linagliptin  5 mg Oral Q breakfast  . lipase/protease/amylase  48,000 Units Oral TID WC  . multivitamin  1 tablet Oral Daily  . pantoprazole  40 mg Oral Q breakfast  . polyethylene glycol  17 g Oral Daily  . prednisoLONE acetate  1 drop Both Eyes QHS  . sevelamer carbonate  2,400 mg Oral TID WC  . traZODone  50 mg Oral QHS    PD prescription: (Under the care of Dr. Justin Mend in [place]) CCPD, EDW 56.5 kg, 2.5 calcium/0.5 magnesium dialysate with variable dextrose.  4 exchanges, 2 L fill volume, 90-minute dwell time, last fill volume 0 cc, 0 daytime exchanges, 0 daytime dwell.  Assessment/Plan: 1.  Peritonitis in patient on peritoneal dialysis: Cell count 1133, gram stain without clear bacteria present - Cx pending. S/p IP Fortaz in addition to IV vancomycin. Pharmacy will follow levels to re-dose. Mental status much improved today. She reports that this is her 1st episode of peritonitis, her daughter has been doing all of her  connections since recent discharge. 2.  End-stage renal disease:  Continue CCPD nightly. 3.  Recent ST elevation MI: Continue Plavix along her other medications. 4.  Hypertension: BP ok. All 1.5% PD fluid used last night - she reports typically uses 2.5% - will change to half-half combination for tonight since will be eating better. 5.  Anemia of chronic disease: Hgb 9 today. Does not appear that she has been given Aranesp recently - will order dose for today (20mg). 6.  Secondary hyperparathyroidism: CorrCa ok. Last Phos low; will repeat.  KVeneta Penton PA-C 09/14/2018, 11:39 AM  [facility] Pager: [phone] Pt seen, examined and agree w A/P as above.  [facility] 09/14/2018, 1:42 PM

## 2018-09-14 NOTE — Progress Notes (Signed)
Patient refused CPAP for the night  

## 2018-09-14 NOTE — Progress Notes (Signed)
Family Medicine Teaching Service Daily Progress Note Intern Pager: [phone]  Patient name: Samantha Conrad Medical record number: [id number] Date of birth: Mar 15, 1941 Age: 78 y.o. Gender: female  Primary Care Provider: Physicians, Di Kindle Family Consultants: Nephro Code Status: DNI  Pt Overview and Major Events to Date:  4/17: admit to FPTS  Assessment and Plan: Samantha Conrad is a 78 y.o. female presenting with SBP in setting of peritoneal peritonitis. PMH is significant for  has a past medical history of Anemia, Basal cell carcinoma, CAD (coronary artery disease), Depression, ESRD on peritoneal dialysis ([place]), Fatty liver, Hypertension, Hypothyroidism, NASH (nonalcoholic steatohepatitis), Seizures ([place]), Sleep apnea, Stroke ([place]) (04/2016), Thrombocytopenia ([place]), Thyroid disease, and Type II diabetes mellitus ([place]). history of SBP.    SBP in setting of peritoneal dialysis - presenting symptoms of worsening abdominal pain. No fevers, N/V/D. Peritoneal fluid culture with NG <12 hours, Gram stain shows no organisms but Abundant WBC - continue  (4/17 - ) Intraperitoneal ceftazidime + IV vancomycin - follow up [place] and cultures - norco-vicoden q4H pain control  ESRD- nephrology on board. PD orders in place. - monitor electrolytes - strict I/O - appreciate nephro recs  CAD with recent NSTEMI 08/2018 with drug eluting stent in LCx. No CP this admission. - continue home lipitor - continue home coreg - Continue home ASA and plavix - holding losartan for now as the patient was not taking this at home   HTN BP hypotensive diastolics at 003/70 this AM. Nephro using 1.5% dextrose in PD solution. Takes midodrine at home after PD. - hold home lasix - hold home losartan - continue coreg - restart BP meds as tolerated  Hx CVA  - No residual deficits. Statin, ASA, plavix as above.   Secondary hyperparathyroidism - Nephro following. Continue PTH lowering meds.  Hypothyroid TSH WNL -  cont home synthroid  T2DM  - A1c in 08/2018 was 5.7.    Continue home Tradjenta,  [place] home glipizide Sliding scale insulin sensitive plus CBG checks every 4 hour  Seizure disorder - continue home keppra  Anxiety and depression and insomnia  - continue home celexa - continue home trazodone  FEN/GI: regular diet PPx: SCDs while in bed  Disposition: Anticipate 1-2 further hospital days   Subjective:  Patient up and out of bed this morning, appears comfortable, walking around room  Objective: Temp:  [97.4 F (36.3 C)-99.5 F (37.5 C)] 98 F (36.7 C) (04/18 0903) Pulse Rate:  [60-76] 74 (04/18 0903) Resp:  [12-22] 16 (04/18 0903) BP: (103-148)/(49-69) 130/63 (04/18 0903) SpO2:  [94 %-100 %] 97 % (04/18 0903) Weight:  [57.6 kg-60.6 kg] 60.6 kg (04/18 0340) Physical Exam: General: NAD, comfortable Cardiovascular: RRR, no m/r/g Respiratory: CTA bil, no W/R/R Abdomen: mild tenderness diffusely Extremities: no LE edema  Laboratory: Recent Labs  Lab 09/08/18 0309 09/13/18 1335 09/14/18 0412  WBC 10.5 9.0 7.7  HGB 9.5* 11.8* 9.0*  HCT 30.2* 39.4 30.6*  PLT 100* 112* 115*   Recent Labs  Lab 09/08/18 0309 09/13/18 1335 09/14/18 0412  NA 130* 133* 136  K 3.9 4.3 4.3  CL 96* 96* 101  CO2 23 23 21*  BUN 50* 48* 47*  CREATININE 4.50* 4.76* 4.87*  CALCIUM 8.5* 8.0* 7.8*  PROT  --  5.4*  --   BILITOT  --  0.6  --   ALKPHOS  --  132*  --   ALT  --  18  --   AST  --  22  --  GLUCOSE 281* 98 108*    Significant labs this admit Ammonia WNL Peritoneal WBC 1100 Peritoneal G stain negative EKG Sinus rhtyhm with nonspecific Twave changes  Imaging/Diagnostic Tests: Ct Abdomen Pelvis Wo Contrast Result Date: 09/13/2018  IMPRESSION: Mild pneumoperitoneum is noted in epigastric region which most likely is related to peritoneal dialysis. Mild wall thickening of gastric antrum is noted which may represent peptic ulcer disease. Clinical correlation and endoscopy may be  performed for further evaluation. Stable pancreatic calcifications are noted consistent with chronic pancreatitis. Mild bilateral pleural effusions are noted with adjacent subsegmental atelectasis. Aortic Atherosclerosis (ICD10-I70.0).    Everrett Coombe, MD 09/14/2018, 12:40 PM PGY-3, [place] Intern pager: [phone], text pages welcome

## 2018-09-14 NOTE — Progress Notes (Signed)
MD notified re: pt critical lab result: body fluid growing gram + cocci on new body fluid culture. No new orders received, will continue to monitor.

## 2018-09-14 NOTE — Progress Notes (Signed)
New Admission Note: ? Arrival Method: Stretcher Mental Orientation: AXOX4 Telemetry: No Assessment: Completed Skin: Refer to flowsheet IV: LFA  Pain: 6 Tubes: PD Cath Safety Measures: Safety Fall Prevention Plan discussed with patient. Admission: Completed 5 [place] Orientation: Patient has been orientated to the room, unit and the staff. Family: None at bedside Orders have been reviewed and are being implemented. Will continue to monitor the patient. Call light has been placed within reach and bed alarm has been activated.  ? (Your Name), RN  Phone Number: [phone]

## 2018-09-15 DIAGNOSIS — K659 Peritonitis, unspecified: Secondary | ICD-10-CM

## 2018-09-15 LAB — CBC
HCT: 33.5 % — ABNORMAL LOW (ref 36.0–46.0)
Hemoglobin: 10 g/dL — ABNORMAL LOW (ref 12.0–15.0)
MCH: 27.6 pg (ref 26.0–34.0)
MCHC: 29.9 g/dL — ABNORMAL LOW (ref 30.0–36.0)
MCV: 92.5 fL (ref 80.0–100.0)
Platelets: 126 10*3/uL — ABNORMAL LOW (ref 150–400)
RBC: 3.62 MIL/uL — ABNORMAL LOW (ref 3.87–5.11)
RDW: 14.7 % (ref 11.5–15.5)
WBC: 7 10*3/uL (ref 4.0–10.5)
nRBC: 0 % (ref 0.0–0.2)

## 2018-09-15 LAB — RENAL FUNCTION PANEL
Albumin: 1.7 g/dL — ABNORMAL LOW (ref 3.5–5.0)
Anion gap: 11 (ref 5–15)
BUN: 37 mg/dL — ABNORMAL HIGH (ref 8–23)
CO2: 27 mmol/L (ref 22–32)
Calcium: 7.8 mg/dL — ABNORMAL LOW (ref 8.9–10.3)
Chloride: 94 mmol/L — ABNORMAL LOW (ref 98–111)
Creatinine, Ser: 3.82 mg/dL — ABNORMAL HIGH (ref 0.44–1.00)
GFR calc Af Amer: 12 mL/min — ABNORMAL LOW (ref >60)
GFR calc non Af Amer: 11 mL/min — ABNORMAL LOW (ref >60)
Glucose, Bld: 145 mg/dL — ABNORMAL HIGH (ref 70–99)
Phosphorus: 3.2 mg/dL (ref 2.5–4.6)
Potassium: 4.1 mmol/L (ref 3.5–5.1)
Sodium: 132 mmol/L — ABNORMAL LOW (ref 135–145)

## 2018-09-15 LAB — VANCOMYCIN, RANDOM: Vancomycin Rm: 18

## 2018-09-15 LAB — GLUCOSE, CAPILLARY
Glucose-Capillary: 134 mg/dL — ABNORMAL HIGH (ref 70–99)
Glucose-Capillary: 119 mg/dL — ABNORMAL HIGH (ref 70–99)
Glucose-Capillary: 105 mg/dL — ABNORMAL HIGH (ref 70–99)
Glucose-Capillary: 312 mg/dL — ABNORMAL HIGH (ref 70–99)

## 2018-09-15 MED ORDER — DELFLEX-LC/1.5% DEXTROSE 344 MOSM/L IP SOLN: INTRAPERITONEAL | Status: DC

## 2018-09-15 MED ORDER — DELFLEX-LC/2.5% DEXTROSE 394 MOSM/L IP SOLN: INTRAPERITONEAL | Status: DC

## 2018-09-15 MED ORDER — VANCOMYCIN HCL 1000 MG IV SOLR
1000.0000 mg | Freq: Once | INTRAVENOUS | Status: AC
  Administered 2018-09-15: 1000 mg via INTRAVENOUS
  Filled 2018-09-15: qty 1000

## 2018-09-15 NOTE — Plan of Care (Signed)
  Problem: Pain Managment: Goal: General experience of comfort will improve Outcome: Progressing   Problem: Pain Management: Goal: Pain related to peritonitis will be minimal Outcome: Progressing   Problem: Clinical Measurements: Goal: Complications related to peritonitis or its treatment will be avoided or minimized Outcome: Progressing

## 2018-09-15 NOTE — Progress Notes (Addendum)
Family Medicine Teaching Service Daily Progress Note Intern Pager: [phone]  Patient name: Samantha Conrad Medical record number: [id number] Date of birth: 1940-11-09 Age: 78 y.o. Gender: female  Primary Care Provider: Physicians, Di Kindle Family Consultants: Nephrology Code Status: Partial Code   Pt Overview and Major Events to Date:  Admitted: 09/13/2018 for CC: abdominal pain Hospital Day: 3  Assessment and Plan: Samantha Conrad is a 78 y.o. female who presented w/ abdominal pain and admitted for SBP in setting of peritoneal dialysis. Samantha Conrad  has a past medical history of Anemia, Basal cell carcinoma, CAD (coronary artery disease), Depression, ESRD on peritoneal dialysis, Fatty liver, Hypertension, Hypothyroidism, NASH (nonalcoholic steatohepatitis), Seizures, Sleep apnea, Stroke (04/2016), Thrombocytopenia, Thyroid disease, and Type II diabetes mellitus.   RENAL  # SBP in setting of peritoneal dialysis Afebrile, denies N/V. Peritoneal fluid culture NGTD. Abdominal pain on admission improved, now with pain from constipation. Gram stain with PMNs, WBCs, Gram positive cocci. Discontinue Ceftazadine.   Continue (4/17 - ) IV Vancomycin per pharmacy - recs for 5 to 10 day course depending on response   Norco 1 to 2 tablets every 4 hours as needed pain  # ESRD/ Secondary hyperparathyroidism  Nephrology following. Patient provided 8.5 L yesterday with 190 out. Patient reports belly feels full and on PE   Peritoneal dialysis  F/u Nepho recs  Strict I/O   Continue sevelamer 3 times daily WC  Per med rec, home phoslo, calcitriol d/c'ed by provider   CARDIOLOGY # CAD s/p STEMI (08/2018) # Hx CVA (04/2016)  Continue aspirin 81, Plavix, atorvastatin  # HTN At home, on lasix, midodrine for hypotension, KDUR 20 BID, losartan 40m. BP stable in 110's. HR 60-70.    Continue carvedilol 3.125 twice daily  Holding losartan   ENDOCRINOLOGY  # DM II At home, glipizide 5 mg. CBGs  ranging from 95-272. Received 8 total aspart in last 24 hours.   Continue linagliptin 5 mg and SSI 3 times daily WC  #Hypothyroidism  Continue levothyroxine 100 mcg   NEUROLOGY # Seizure disorder  Continue Keppra twice daily  # Anxiety/Depression   Continue citalopram 10 mg daily, trazodone 50 mg nightly  GI # PUD  Continue pantoprazole 40 mg daily  # Chronic Pancreatitis  Continue Creon 40,000 units 3 times daily WC  #Consitpation  . PEG daily, PRN zofran  HEME #ACD in setting of ESRD   Daily MVI   #Throbocytopenia, chronic  Platelets 126.   #FEN:   Fluids:  Fluid restriction 12014m . Electrolytes: KVO   . Nutrition: Renal/Carb Modf   VTE prophylaxis: Heparin 30 Q8H (CrCl <30)   Disposition: Home pending medical improvement and renal recs.  =================================== Subjective:  NAEO. Feeling improved this morning. Walked around to move bowels. Feels exhausted from walking around. No trouble breathing.   Objective: BP (!) 118/58 (BP Location: Left Arm)   Pulse 66   Temp 97.8 F (36.6 C) (Oral)   Resp 18   Ht 5' 1"  (1.549 m)   Wt 63.1 kg   SpO2 95%   BMI 26.28 kg/m  Intake/Output      04/18 0701 - 04/19 0700 04/19 0701 - 04/20 0700   P.O. 720    I.V. (mL/kg)     Other 8007    IV Piggyback     Total Intake(mL/kg) 8727 (138.3)    Urine (mL/kg/hr) 150 (0.1)    Emesis/NG output     Other 40    Stool 0  Blood     Total Output 190    Net +8537         Urine Occurrence 0 x    Stool Occurrence 0 x     Physical Exam: General: NAD, non-toxic, well-appearing, sitting comfortably in bed.  Cardiovascular: RRR, normal S1, S2. 2+ RP bilaterally Respiratory: CTAB. No IWOB. Breathing comfortably. No crackles on exam. Abdomen: + BS. Tympanic to percussion. NT. Feels full to palpation.  Extremities: Warm and well perfused. Moving spontaneously. Very mild pitting edema.   Laboratory: I have personally read and reviewed all labs and  imaging studies.   CBC: Recent Labs  Lab 2018/10/12 1335 09/14/18 0412 09/15/18 0649  WBC 9.0 7.7 7.0  NEUTROABS 7.1  --   --   HGB 11.8* 9.0* 10.0*  HCT 39.4 30.6* 33.5*  MCV 91.8 93.3 92.5  PLT 112* 115* 800*   Basic Metabolic Panel: Recent Labs  Lab 12-Oct-2018 1335 09/14/18 0412  NA 133* 136  K 4.3 4.3  CL 96* 101  CO2 23 21*  GLUCOSE 98 108*  BUN 48* 47*  CREATININE 4.76* 4.87*  CALCIUM 8.0* 7.8*  PHOS  --  3.6   GFR: Estimated Creatinine Clearance: 8 mL/min (A) (by C-G formula based on SCr of 4.87 mg/dL (H)). Liver Function Tests: Recent Labs  Lab 10/12/2018 1335  AST 22  ALT 18  ALKPHOS 132*  BILITOT 0.6  PROT 5.4*  ALBUMIN 1.9*   Recent Labs  Lab 2018-10-12 1335  LIPASE 17   Recent Labs  Lab 10/12/2018 1404  AMMONIA 24   CBG: Recent Labs  Lab 09/14/18 0720 09/14/18 1128 09/14/18 1626 09/14/18 2107 09/15/18 0633  GLUCAP 194* 272* 95 224* 134*   MRSA negative Blood culture - NGTD.  Peritoneal fluid: abundant WBC and PMNs.  Gram stain GPC  Imaging/Diagnostic Tests: Ct Abdomen Pelvis Wo Contrast Result Date: 2018/10/12 Mild pneumoperitoneum is noted in epigastric region which most likely is related to peritoneal dialysis. Mild wall thickening of gastric antrum is noted which may represent peptic ulcer disease. Clinical correlation and endoscopy may be performed for further evaluation. Stable pancreatic calcifications are noted consistent with chronic pancreatitis. Mild bilateral pleural effusions are noted with adjacent subsegmental atelectasis. Aortic Atherosclerosis (ICD10-I70.0).   Wilber Oliphant, MD 09/15/2018, 8:19 AM PGY-1, [place] Intern pager: [phone], text pages welcome

## 2018-09-15 NOTE — Progress Notes (Addendum)
[facility] Progress Note   Subjective:  Seen in room - eating breakfast. No issues with CCPD overnight. Still with some abd pain, but continues to improve. No CP/dyspnea.  Objective Vitals:   09/14/18 1946 09/15/18 0546 09/15/18 0600 09/15/18 0838  BP: (!) 114/50 (!) 118/58  (!) 132/56  Pulse: 69 66  79  Resp: 18 18  17   Temp: 98.4 F (36.9 C) 97.8 F (36.6 C)  98.1 F (36.7 C)  TempSrc: Oral Oral  Oral  SpO2: 97% 95%  99%  Weight:   63.1 kg   Height:       Physical Exam General: Well appearing, elderly woman, NAD. Oriented and seems clear mentally. Heart: RRR; no murmur Lungs: CTA anteriorly Abdomen: mild tenderness without guarding or rebound tenderness Extremities: No LE edema Dialysis Access: PD cath in RLQ  Additional Objective Labs: Basic Metabolic Panel: Recent Labs  Lab 09/13/18 1335 09/14/18 0412 09/15/18 0649  NA 133* 136 132*  K 4.3 4.3 4.1  CL 96* 101 94*  CO2 23 21* 27  GLUCOSE 98 108* 145*  BUN 48* 47* 37*  CREATININE 4.76* 4.87* 3.82*  CALCIUM 8.0* 7.8* 7.8*  PHOS  --  3.6 3.2   Liver Function Tests: Recent Labs  Lab 09/13/18 1335 09/15/18 0649  AST 22  --   ALT 18  --   ALKPHOS 132*  --   BILITOT 0.6  --   PROT 5.4*  --   ALBUMIN 1.9* 1.7*   Recent Labs  Lab 09/13/18 1335  LIPASE 17   CBC: Recent Labs  Lab 09/13/18 1335 09/14/18 0412 09/15/18 0649  WBC 9.0 7.7 7.0  NEUTROABS 7.1  --   --   HGB 11.8* 9.0* 10.0*  HCT 39.4 30.6* 33.5*  MCV 91.8 93.3 92.5  PLT 112* 115* 126*   Blood Culture    Component Value Date/Time   SDES FLUID PERITONEAL 09/13/2018 1656   SDES FLUID PERITONEAL 09/13/2018 1656   SPECREQUEST NONE 09/13/2018 1656   SPECREQUEST BOTTLES DRAWN AEROBIC AND ANAEROBIC 09/13/2018 1656   CULT  09/13/2018 1656    NO GROWTH 2 DAYS Performed at [facility], [place]. [address], [place], [place] [address]    REPTSTATUS 09/13/2018 FINAL 09/13/2018 1656   REPTSTATUS PENDING 09/13/2018 1656    CBG: Recent Labs  Lab 09/14/18 0720 09/14/18 1128 09/14/18 1626 09/14/18 2107 09/15/18 0633  GLUCAP 194* 272* 95 224* 134*   Studies/Results: Ct Abdomen Pelvis Wo Contrast  Result Date: 09/13/2018 CLINICAL DATA:  Acute right-sided abdominal pain. EXAM: CT ABDOMEN AND PELVIS WITHOUT CONTRAST TECHNIQUE: Multidetector CT imaging of the abdomen and pelvis was performed following the standard protocol without IV contrast. COMPARISON:  CT scan of March 11, 2017. FINDINGS: Lower chest: Mild bilateral pleural effusions are noted with adjacent subsegmental atelectasis. Hepatobiliary: No focal liver abnormality is seen. Status post cholecystectomy. No biliary dilatation. Pancreas: Stable pancreatic calcifications are noted consistent with chronic pancreatitis. No acute inflammation or ductal dilatation is noted. Spleen: Normal in size without focal abnormality. Adrenals/Urinary Tract: Adrenal glands appear normal. Stable right renal cyst is noted. Bilateral renal atrophy is noted consistent with history of end-stage renal disease. No hydronephrosis or renal obstruction is noted. No renal or ureteral calculi are noted. Urinary bladder is unremarkable. Stomach/Bowel: The appendix appears unremarkable. There is no evidence of bowel obstruction. Sigmoid diverticulosis is noted without inflammation. Wall thickening of gastric antrum is noted which may represent peptic ulcer disease. Vascular/Lymphatic: Aortic atherosclerosis. No enlarged  abdominal or pelvic lymph nodes. Reproductive: Status post hysterectomy. No adnexal masses. Other: Mild pneumoperitoneum is noted in the epigastric region which most likely is related to peritoneal dialysis. Distal tip of dialysis catheter is seen in the pelvis. Musculoskeletal: No acute or significant osseous findings. IMPRESSION: Mild pneumoperitoneum is noted in epigastric region which most likely is related to peritoneal dialysis. Mild wall thickening of gastric antrum is  noted which may represent peptic ulcer disease. Clinical correlation and endoscopy may be performed for further evaluation. Stable pancreatic calcifications are noted consistent with chronic pancreatitis. Mild bilateral pleural effusions are noted with adjacent subsegmental atelectasis. Aortic Atherosclerosis (ICD10-I70.0). Electronically Signed   By: Marijo Conception M.D.   On: 09/13/2018 14:21   Medications: . dialysis solution 1.5% low-MG/low-CA    . dialysis solution 1.5% low-MG/low-CA     . aspirin EC  81 mg Oral Q breakfast  . atorvastatin  40 mg Oral q1800  . carvedilol  3.125 mg Oral BID WC  . citalopram  10 mg Oral Q breakfast  . clopidogrel  75 mg Oral Daily  . docusate sodium  100 mg Oral Q lunch  . feeding supplement (NEPRO CARB STEADY)  237 mL Oral Q lunch  . feeding supplement (PRO-STAT SUGAR FREE 64)  30 mL Oral BID  . gentamicin cream  1 application Topical Daily  . heparin injection (subcutaneous)  5,000 Units Subcutaneous Q8H  . insulin aspart  0-9 Units Subcutaneous TID WC  . levETIRAcetam  250 mg Oral BID WC  . levothyroxine  100 mcg Oral Q0600  . linagliptin  5 mg Oral Q breakfast  . lipase/protease/amylase  48,000 Units Oral TID WC  . multivitamin  1 tablet Oral Daily  . pantoprazole  40 mg Oral Q breakfast  . polyethylene glycol  17 g Oral Daily  . prednisoLONE acetate  1 drop Both Eyes QHS  . sevelamer carbonate  2,400 mg Oral TID WC  . traZODone  50 mg Oral QHS   PD prescription:(Under the care of Dr. Justin Mend in [place]) CCPD, EDW 56.5 kg, 2.5 calcium/0.5 magnesium dialysate with variable dextrose. 4 exchanges, 2 L fill volume, 90-minute dwell time, last fill volume 0 cc, 0 daytime exchanges, 0 daytime dwell.  Assessment/Plan: 1.Peritonitis in patient on peritoneal dialysis:Cell count 1133, gram stain without clear bacteria present - PD fluid Cx prelim + GPC. S/p IP Fortaz in addition to IV vancomycin. Pharmacy will follow levels to re-dose. Mental status  much improved. She reports that this is her 1st episode of peritonitis. Will dc Fortaz (w/ GPC on culture), continue IV vanc per pharm (sp load, awaiting vanc level today), have d/w pharmacy.   2. End-stage renal disease: Continue CCPD nightly. 3. Recent ST elevation MI: Continue Plavix along her other medications. 4. Hypertension:BP ok. Using half-half combination (1.5%, 2.5% bags) 5. Anemia of chronic disease: Hgb 10. No ESA this admit so far, follow. 6. Secondary hyperparathyroidism: CorrCa/Phos ok. Continue home meds. 7.  Altered mental status: improved significantly, hx CVA/ dementia/ memory issues. OK for dc medically but pt is on her own at home regarding setting up and doing PD, spoke w/ the son. Family is local and available for trips to the doctor etc., but are not w/ her every day. Need to be sure she is strong enough to do her PD at home and generally take care of herself, etc., before dc home.  Will consult PT  Pollyann Kennedy 09/15/2018, 9:40 AM  [facility] Pager: [phone])  [phone]  Pt seen, examined and agree w assess/plan as above with additions as indicated.  [facility] 09/15/2018, 1:11 PM

## 2018-09-15 NOTE — Progress Notes (Signed)
RT Note:  Checked in with patient concerning CPAP order.  Patient stated that she used to wear one when she was heavier, she stated that she lost a lot of weight and has not had to use one since then.  She stated she currently does not use one at home and refused use here.  Will continue to monitor patient.

## 2018-09-15 NOTE — Progress Notes (Signed)
Pharmacy Antibiotic Note  Samantha Conrad is a 78 y.o. female admitted on 09/13/2018 with possible peritonitis. PD WBC 1133 with GPC on gram stain. Vancomycin random level 18. Will redose since <20  Plan:  Vanc IV 1000 mg x 1 Monitor PD, cx, vanc lvls Tues, redose when < 20  Height: 5' 1"  (154.9 cm) Weight: 138 lb 7.2 oz (62.8 kg) IBW/kg (Calculated) : 47.8  Temp (24hrs), Avg:98.1 F (36.7 C), Min:97.8 F (36.6 C), Max:98.4 F (36.9 C)  Recent Labs  Lab 09/13/18 1335 09/13/18 1403 09/14/18 0412 09/15/18 0649 09/15/18 1114  WBC 9.0  --  7.7 7.0  --   CREATININE 4.76*  --  4.87* 3.82*  --   LATICACIDVEN  --  1.5  --   --   --   VANCORANDOM  --   --   --   --  18    Estimated Creatinine Clearance: 10.1 mL/min (A) (by C-G formula based on SCr of 3.82 mg/dL (H)).    Allergies  Allergen Reactions  . Contrast Media [Iodinated Diagnostic Agents] Swelling    SWELLING REACTION UNSPECIFIED   . Tape Other (See Comments)    Tears skin - please use paper tape   [facility] A. Levada Dy, PharmD, [place] Please utilize Amion for appropriate phone number to reach the unit pharmacist ([place])    09/15/2018 1:07 PM

## 2018-09-16 LAB — RENAL FUNCTION PANEL
Albumin: 1.4 g/dL — ABNORMAL LOW (ref 3.5–5.0)
Anion gap: 10 (ref 5–15)
BUN: 37 mg/dL — ABNORMAL HIGH (ref 8–23)
CO2: 27 mmol/L (ref 22–32)
Calcium: 7.7 mg/dL — ABNORMAL LOW (ref 8.9–10.3)
Chloride: 94 mmol/L — ABNORMAL LOW (ref 98–111)
Creatinine, Ser: 4.13 mg/dL — ABNORMAL HIGH (ref 0.44–1.00)
GFR calc Af Amer: 11 mL/min — ABNORMAL LOW (ref >60)
GFR calc non Af Amer: 10 mL/min — ABNORMAL LOW (ref >60)
Glucose, Bld: 167 mg/dL — ABNORMAL HIGH (ref 70–99)
Phosphorus: 3.2 mg/dL (ref 2.5–4.6)
Potassium: 3.9 mmol/L (ref 3.5–5.1)
Sodium: 131 mmol/L — ABNORMAL LOW (ref 135–145)

## 2018-09-16 LAB — CULTURE, BODY FLUID W GRAM STAIN -BOTTLE

## 2018-09-16 LAB — CBC
HCT: 26.5 % — ABNORMAL LOW (ref 36.0–46.0)
Hemoglobin: 8 g/dL — ABNORMAL LOW (ref 12.0–15.0)
MCH: 27.2 pg (ref 26.0–34.0)
MCHC: 30.2 g/dL (ref 30.0–36.0)
MCV: 90.1 fL (ref 80.0–100.0)
Platelets: 118 10*3/uL — ABNORMAL LOW (ref 150–400)
RBC: 2.94 MIL/uL — ABNORMAL LOW (ref 3.87–5.11)
RDW: 14.6 % (ref 11.5–15.5)
WBC: 5.6 10*3/uL (ref 4.0–10.5)
nRBC: 0 % (ref 0.0–0.2)

## 2018-09-16 LAB — GLUCOSE, CAPILLARY
Glucose-Capillary: 145 mg/dL — ABNORMAL HIGH (ref 70–99)
Glucose-Capillary: 161 mg/dL — ABNORMAL HIGH (ref 70–99)
Glucose-Capillary: 132 mg/dL — ABNORMAL HIGH (ref 70–99)
Glucose-Capillary: 248 mg/dL — ABNORMAL HIGH (ref 70–99)

## 2018-09-16 NOTE — Progress Notes (Signed)
Patient with increased abdominal pain at 10/10. Medicated with PRN medication and heat pack applied. Will continue to monitor. Dorthey Sawyer, RN

## 2018-09-16 NOTE — Progress Notes (Addendum)
Family Medicine Teaching Service Daily Progress Note Intern Pager: [phone]  Patient name: [facility] Medical record number: [id number] Date of birth: 1940/07/05 Age: 78 y.o. Gender: female  Primary Care Provider: Physicians, [facility]: Nephrology  Code Status: Partial Code   Pt Overview and Major Events to Date:  Admitted: 09/13/2018 for CC: abdominal pain   Hospital Day: 4 09/13/18: paracentesis, started IV Vanc, Ceftazodine (4/17-18) 4/19: weaned to RA, sp feraheme   Assessment and Plan: Samantha Conrad is a 78 y.o. female who presented w/ abdominal pain and admitted for SBP in setting of peritoneal dialysis. Sam[facility] Conrad  has a past medical history of Anemia, Basal cell carcinoma, CAD (coronary artery disease), Depression, ESRD on peritoneal dialysis, Fatty liver, Hypertension, Hypothyroidism, NASH (nonalcoholic steatohepatitis), Seizures, Sleep apnea, Stroke (04/2016), Thrombocytopenia, Thyroid disease, and Type II diabetes mellitus.   RENAL  # SBP in setting of peritoneal dialysis Afebrile, denies N/V. Peritoneal fluid culture NGTD. Abdominal pain on admission improved, now with pain from constipation. Gram stain with PMNs, WBCs, Gram positive cocci. Discontinue Ceftazadine. Blood cultures negative x 3 days. Peritoneal culture with Staph epi; coag negative staph is MCC of SBP in peritoneal patients. Waiting on patient's strength to return for safe discharge as she lives by herself.  Continue (4/17 - ) IV Vancomycin per pharmacy - recs for 14 day course   Will confirm  With neprhology  Norco 1 to 2 tablets every 4 hours as needed pain  PT consulted - follow up recs.  # ESRD/ Secondary hyperparathyroidism  Nephrology following. Output 747m (patient makes some urine. Lasix does not really make a difference for her urine volume, she reports.  She reports she does not take Lasix at home, however it is on her medication list.  Corrected calcium 9.8.  Phos within  normal limits today at 3.2.  Peritoneal dialysis  F/u Nepho recs  Strict I/O   Continue sevelamer 3 times daily WC  Per med rec, home phoslo, calcitriol d/c'ed by provider   #ACD in setting of ESRD  Hgb 8.0 today, down from 10.0. All cell lines down. Nephrology also following.   Daily MVI   Continue to monitor on daily CBC   CARDIOLOGY # CAD s/p STEMI (08/2018) # Hx CVA (04/2016)  Continue aspirin 81, Plavix, atorvastatin  # HTN At home, on lasix, midodrine for hypotension, KDUR 20 BID, losartan 237m   Continue carvedilol 3.125 twice daily  Holding losartan   ENDOCRINOLOGY  # DM II At home, glipizide 5 mg. CBGs ranging from 95-272.  2 units in the last 24 hours.  CBGs ranging from 102-345.  Continue linagliptin 5 mg and SSI 3 times daily WC  #Hypothyroidism  Continue levothyroxine 100 mcg   NEUROLOGY # Seizure disorder  Continue Keppra twice daily  # Anxiety/Depression   Continue citalopram 10 mg daily, trazodone 50 mg nightly  GI # PUD  Continue pantoprazole 40 mg daily  # Chronic Pancreatitis  Continue Creon 40,000 units 3 times daily WC  #Consitpation  Had a good BM this morning.   PEG daily, PRN zofran  HEME #Throbocytopenia, chronic  Platelets range in low 100's. 118 today  #FEN:   Fluids:  Fluid restriction 120069m Electrolytes: KVO    Nutrition: Renal/Carb Modf   VTE prophylaxis: Heparin 5000 Q8H (CrCl <30)   Disposition: Home pending medical improvement and safe discharge plan ================================================================ Subjective:  NAEO. Patient reports that her belly feels a little less full but is unsure of  how much she is getting taken off during PD.  Objective: BP (!) 106/51 (BP Location: Left Arm)   Pulse 67   Temp 98.2 F (36.8 C) (Oral)   Resp 18   Ht 5' 1"  (1.549 m)   Wt 66.1 kg   SpO2 95%   BMI 27.53 kg/m  Intake/Output      04/19 0701 - 04/20 0700 04/20 0701 - 04/21  0700   P.O. 680    Other 8007    Total Intake(mL/kg) 8687 (131.4)    Urine (mL/kg/hr) 700 (0.4)    Other     Stool     Total Output 700    Net +7987          Physical Exam: General: NAD, non-toxic, well-appearing, sitting comfortably in bed.  Cardiovascular: RRR, normal S1, S2. 2+ RP bilaterally. No BLEE  Respiratory: CTAB. No IWOB.  Abdomen: + BS. NT. Not as tense as yesterday.  Extremities: Warm and well perfused. Moving spontaneously.   Laboratory: I have personally read and reviewed all labs and imaging studies.  CBC: Recent Labs  Lab 09/13/18 1335 09/14/18 0412 09/15/18 0649 09/16/18 0656  WBC 9.0 7.7 7.0 5.6  NEUTROABS 7.1  --   --   --   HGB 11.8* 9.0* 10.0* 8.0*  HCT 39.4 30.6* 33.5* 26.5*  MCV 91.8 93.3 92.5 90.1  PLT 112* 115* 126* PENDING   Basic Metabolic Panel: Recent Labs  Lab 09/13/18 1335 09/14/18 0412 09/15/18 0649 09/16/18 0656  NA 133* 136 132* 131*  K 4.3 4.3 4.1 3.9  CL 96* 101 94* 94*  CO2 23 21* 27 27  GLUCOSE 98 108* 145* 167*  BUN 48* 47* 37* 37*  CREATININE 4.76* 4.87* 3.82* 4.13*  CALCIUM 8.0* 7.8* 7.8* 7.7*  PHOS  --  3.6 3.2 3.2   GFR: Estimated Creatinine Clearance: 9.6 mL/min (A) (by C-G formula based on SCr of 4.13 mg/dL (H)). Liver Function Tests: Recent Labs  Lab 09/13/18 1335 09/15/18 0649 09/16/18 0656  AST 22  --   --   ALT 18  --   --   ALKPHOS 132*  --   --   BILITOT 0.6  --   --   PROT 5.4*  --   --   ALBUMIN 1.9* 1.7* 1.4*   CBG: Recent Labs  Lab 09/15/18 0633 09/15/18 1136 09/15/18 1619 09/15/18 2232 09/16/18 0718  GLUCAP 134* 119* 105* 312* 145*     Bld Cx x 2- NGX2 DAYS  Imaging/Diagnostic Tests: No results found.   Samantha Oliphant, MD 09/16/2018, 8:39 AM PGY-1, [place] Intern pager: [phone], text pages welcome

## 2018-09-16 NOTE — Progress Notes (Signed)
Patient declined CPAP at this time, no distress noted RCP will continue to follow.

## 2018-09-16 NOTE — Evaluation (Signed)
Physical Therapy Evaluation Patient Details Name: Samantha [facility] MRN: [id number] DOB: 18-Apr-1941 Today's Date: 09/16/2018   History of Present Illness  Samantha Conrad is a 78 y.o. female presenting with SBP in setting of peritoneal peritonitis. PMH is significant for  has a past medical history of Anemia, Basal cell carcinoma, CAD (coronary artery disease), Depression, ESRD on peritoneal dialysis ([place]), Fatty liver, Hypertension, Hypothyroidism, NASH (nonalcoholic steatohepatitis), Seizures ([place]), Sleep apnea, Stroke ([place]) (04/2016), Thrombocytopenia ([place]), Thyroid disease, and Type II diabetes mellitus ([place]). history of SBP.   Clinical Impression  Pt admitted with above. Pt lives alone but daughter lives 2 houses down. She comes over daily and brings her dinner every night and cleans her house. Pt mildly unsteady and deconditioned from hospital stay. Pt to benefit from 24/7 assist initially and HHPT to achieve safe mod I level of function.    Follow Up Recommendations Home health PT;Supervision/Assistance - 24 hour    Equipment Recommendations  None recommended by PT    Recommendations for Other Services       Precautions / Restrictions Precautions Precautions: Fall Restrictions Weight Bearing Restrictions: No      Mobility  Bed Mobility Overal bed mobility: Modified Independent             General bed mobility comments: HOB elevated, used bed rail, increased time  Transfers Overall transfer level: Needs assistance Equipment used: None Transfers: Sit to/from Stand Sit to Stand: Min guard         General transfer comment: min guard for initially unsteadiness upon standing  Ambulation/Gait Ambulation/Gait assistance: Min assist Gait Distance (Feet): 200 Feet(x1, 120x1) Assistive device: Rolling walker (2 wheeled);1 person hand held assist Gait Pattern/deviations: Step-through pattern;Decreased stride length Gait velocity: decreased Gait velocity  interpretation: <1.31 ft/sec, indicative of household ambulator General Gait Details: pt unable to ambulate without HHA. pt unsteady especially during turns. when attempted to let go of PT during ambulation pt reaching for handrail. pt given RW and pt reports "I  feel much safer with this, I will probably use it when i go home."  Stairs            Wheelchair Mobility    Modified Rankin (Stroke Patients Only)       Balance Overall balance assessment: Needs assistance Sitting-balance support: Feet supported;No upper extremity supported Sitting balance-Leahy Scale: Good     Standing balance support: Single extremity supported Standing balance-Leahy Scale: Poor Standing balance comment: more steady with bilat UE support                             Pertinent Vitals/Pain Pain Assessment: 0-10 Pain Score: 2  Pain Location: R LQ Pain Descriptors / Indicators: Sore Pain Intervention(s): Monitored during session    Home Living Family/patient expects to be discharged to:: Private residence Living Arrangements: Alone Available Help at Discharge: Family;Available PRN/intermittently Type of Home: House Home Access: Stairs to enter Entrance Stairs-Rails: None Entrance Stairs-Number of Steps: 1 Home Layout: One level Home Equipment: Walker - 2 wheels;Shower seat;Toilet riser      Prior Function Level of Independence: Needs assistance   Gait / Transfers Assistance Needed: doesn't typical use AD  ADL's / Homemaking Assistance Needed: makes breakfast, does laundry, daughter makes her dinner every night and makes sure she eats lunch        Hand Dominance   Dominant Hand: Right    Extremity/Trunk Assessment   Upper Extremity Assessment Upper Extremity  Assessment: Generalized weakness    Lower Extremity Assessment Lower Extremity Assessment: Generalized weakness    Cervical / Trunk Assessment Cervical / Trunk Assessment: Normal  Communication    Communication: No difficulties  Cognition Arousal/Alertness: Awake/alert Behavior During Therapy: WFL for tasks assessed/performed Overall Cognitive Status: Within Functional Limits for tasks assessed                                        General Comments General comments (skin integrity, edema, etc.): VSS    Exercises     Assessment/Plan    PT Assessment Patient needs continued PT services  PT Problem List Decreased strength;Decreased activity tolerance;Decreased balance;Decreased mobility;Decreased coordination;Decreased knowledge of use of DME;Decreased cognition;Decreased safety awareness       PT Treatment Interventions DME instruction;Gait training;Stair training;Functional mobility training;Therapeutic activities;Therapeutic exercise;Balance training;Neuromuscular re-education    PT Goals (Current goals can be found in the Care Plan section)  Acute Rehab PT Goals Patient Stated Goal: home PT Goal Formulation: With patient Time For Goal Achievement: 09/30/18 Potential to Achieve Goals: Good    Frequency Min 3X/week   Barriers to discharge        Co-evaluation               AM-PAC PT "6 Clicks" Mobility  Outcome Measure Help needed turning from your back to your side while in a flat bed without using bedrails?: None Help needed moving from lying on your back to sitting on the side of a flat bed without using bedrails?: None Help needed moving to and from a bed to a chair (including a wheelchair)?: A Little Help needed standing up from a chair using your arms (e.g., wheelchair or bedside chair)?: A Little Help needed to walk in hospital room?: A Little Help needed climbing 3-5 steps with a railing? : A Little 6 Click Score: 20    End of Session Equipment Utilized During Treatment: Gait belt Activity Tolerance: Patient tolerated treatment well Patient left: in chair;with call bell/phone within reach Nurse Communication: Mobility status PT  Visit Diagnosis: Unsteadiness on feet (R26.81);Difficulty in walking, not elsewhere classified (R26.2)    Time: 6629-4765 PT Time Calculation (min) (ACUTE ONLY): 24 min   Charges:   PT Evaluation $PT Eval Moderate Complexity: 1 Mod PT Treatments $Gait Training: 8-22 mins        Kittie Plater, PT, DPT Acute Rehabilitation Services Pager #: [phone] Office #: [phone]   Berline Lopes 09/16/2018, 11:36 AM

## 2018-09-16 NOTE — TOC Initial Note (Signed)
Transition of Care [facility]) - Initial/Assessment Note    Patient Details  Name: Samantha Conrad MRN: [id number] Date of Birth: [birth year]  Transition of [facility]) CM/SW Contact:    Bartholomew Crews, RN Phone Number: [phone] 09/16/2018, 4:43 PM  Clinical Narrative:                 Spoke with patient at bedside. Discussed HH orders for PT, RN. Patient in agreement. States that she lives alone, but her daughter lives next door. States that her son will likely be the one to pick her up at time of discharge. Discussed home health agencies and provided choice list. Patient states that she has used [facility] in the past. Referral placed - accepted - they confirmed that they have worked with her in the past. Orders and clinical/MD notes faxed to home health agency - [phone]. CM to continue to follow for transition of care needs.   Expected Discharge Plan: [place] Barriers to Discharge: Continued Medical Work up   Patient Goals and CMS Choice   CMS Medicare.gov Compare Post Acute Care list provided to:: Patient Choice offered to / list presented to : Patient  Expected Discharge Plan and Services Expected Discharge Plan: [place] In-house Referral: NA Discharge Planning Services: CM Consult Post Acute Care Choice: [place] arrangements for the past 2 months: Single Family Home                 DME Arranged: N/A DME Agency: NA HH Arranged: PT, RN [place] Agency: [facility]  Prior Living Arrangements/Services Living arrangements for the past 2 months: Single Family Home Lives with:: Self Patient language and need for interpreter reviewed:: Yes        Need for Family Participation in Patient Care: Yes (Comment) Care giver support system in place?: Yes (comment)   Criminal Activity/Legal Involvement Pertinent to Current Situation/Hospitalization: No - Comment as needed  Activities of  Daily Living Home Assistive Devices/Equipment: Grab bars around toilet, Grab bars in shower, Blood pressure cuff, Scales, Dentures (specify type) ADL Screening (condition at time of admission) Patient's cognitive ability adequate to safely complete daily activities?: No Is the patient deaf or have difficulty hearing?: No Does the patient have difficulty seeing, even when wearing glasses/contacts?: No Does the patient have difficulty concentrating, remembering, or making decisions?: No Patient able to express need for assistance with ADLs?: No Does the patient have difficulty dressing or bathing?: No Independently performs ADLs?: Yes (appropriate for developmental age) Does the patient have difficulty walking or climbing stairs?: No Weakness of Legs: None Weakness of Arms/Hands: None  Permission Sought/Granted                  Emotional Assessment Appearance:: Appears stated age Attitude/Demeanor/Rapport: Engaged Affect (typically observed): Accepting Orientation: : Oriented to Self, Oriented to Place, Oriented to  Time, Oriented to Situation Alcohol / Substance Use: Not Applicable Psych Involvement: No (comment)  Admission diagnosis:  Peritonitis ([place]) [K65.9] Patient Active Problem List   Diagnosis Date Noted  . Peritonitis ([place])   . Spontaneous bacterial peritonitis ([place]) 09/13/2018  . CAD (coronary artery disease)   . Anemia   . Thrombocytopenia ([place])   . Acute ST elevation myocardial infarction (STEMI) of posterior wall ([place]) 09/06/2018  . Symptomatic anemia 03/11/2017  . Diet-controlled diabetes mellitus ([place]) 03/11/2017  . NASH (nonalcoholic steatohepatitis) 03/11/2017  . Hypertension 03/11/2017  . Hypothyroidism  03/11/2017  . ESRD on dialysis ([place]) 03/11/2017  . Seizure disorder 03/11/2017  . Abdominal pain 03/11/2017  . HLD (hyperlipidemia) 03/11/2017  . Chronic pancreatitis ([place]) 03/11/2017  . Constipation 03/11/2017  . Laryngopharyngeal reflux (LPR)  02/17/2015  . H/O Mild allergic rhinitis 02/17/2015  . Cirrhosis ([place]) 02/17/2015   PCP:  [facility], [place]:   [id number] [id number]: [phone]: [phone]    Social Determinants of Health (SDOH) Interventions    Readmission Risk Interventions No flowsheet data found.

## 2018-09-16 NOTE — Progress Notes (Addendum)
Samantha Conrad Progress Note   Subjective:   Patient seen in room. Alert, pleasant. Reports abdominal pain continues to improve. Had a BM this AM. Afebrile, denies any dizziness or complications from PD last night. No SOB, dyspnea, CP, edema, N/V/D.   Objective Vitals:   09/15/18 2134 09/16/18 0455 09/16/18 0500 09/16/18 0837  BP: (!) 106/45 (!) 105/46  (!) 106/51  Pulse: 67 63  67  Resp: 20 20  18   Temp: 98 F (36.7 C) 98 F (36.7 C)  98.2 F (36.8 C)  TempSrc: Oral Oral  Oral  SpO2: 95% 93%  95%  Weight:   66.1 kg   Height:       Physical Exam General: Well developed female, alert, in NAD Heart: RRR, no murmurs, rubs or gallops Lungs:CTA bilaterally without wheezing, rhonchi or rales Abdomen: Soft, on-distended. Mild diffuse tenderness bilateral lower quadrants. No rebound tenderness.  Extremities: No edema bilateral lower extremities.  Dialysis Access: PD cath RLQ without erythema or drainage.   Additional Objective Labs: Basic Metabolic Panel: Recent Labs  Lab 09/14/18 0412 09/15/18 0649 09/16/18 0656  NA 136 132* 131*  K 4.3 4.1 3.9  CL 101 94* 94*  CO2 21* 27 27  GLUCOSE 108* 145* 167*  BUN 47* 37* 37*  CREATININE 4.87* 3.82* 4.13*  CALCIUM 7.8* 7.8* 7.7*  PHOS 3.6 3.2 3.2   Liver Function Tests: Recent Labs  Lab 09/13/18 1335 09/15/18 0649 09/16/18 0656  AST 22  --   --   ALT 18  --   --   ALKPHOS 132*  --   --   BILITOT 0.6  --   --   PROT 5.4*  --   --   ALBUMIN 1.9* 1.7* 1.4*   Recent Labs  Lab 09/13/18 1335  LIPASE 17   CBC: Recent Labs  Lab 09/13/18 1335 09/14/18 0412 09/15/18 0649 09/16/18 0656  WBC 9.0 7.7 7.0 5.6  NEUTROABS 7.1  --   --   --   HGB 11.8* 9.0* 10.0* 8.0*  HCT 39.4 30.6* 33.5* 26.5*  MCV 91.8 93.3 92.5 90.1  PLT 112* 115* 126* 118*   Blood Culture    Component Value Date/Time   SDES FLUID PERITONEAL 09/13/2018 1656   SDES FLUID PERITONEAL 09/13/2018 1656   SPECREQUEST NONE 09/13/2018 1656   SPECREQUEST BOTTLES DRAWN AEROBIC AND ANAEROBIC 09/13/2018 1656   CULT STAPHYLOCOCCUS EPIDERMIDIS (A) 09/13/2018 1656   REPTSTATUS 09/13/2018 FINAL 09/13/2018 1656   REPTSTATUS 09/16/2018 FINAL 09/13/2018 1656    CBG: Recent Labs  Lab 09/15/18 3329 09/15/18 1136 09/15/18 1619 09/15/18 2232 09/16/18 0718  GLUCAP 134* 119* 105* 312* 145*   Medications: . dialysis solution 1.5% low-MG/low-CA    . dialysis solution 1.5% low-MG/low-CA    . dialysis solution 2.5% low-MG/low-CA     . aspirin EC  81 mg Oral Q breakfast  . atorvastatin  40 mg Oral q1800  . carvedilol  3.125 mg Oral BID WC  . citalopram  10 mg Oral Q breakfast  . clopidogrel  75 mg Oral Daily  . docusate sodium  100 mg Oral Q lunch  . feeding supplement (NEPRO CARB STEADY)  237 mL Oral Q lunch  . feeding supplement (PRO-STAT SUGAR FREE 64)  30 mL Oral BID  . gentamicin cream  1 application Topical Daily  . heparin injection (subcutaneous)  5,000 Units Subcutaneous Q8H  . insulin aspart  0-9 Units Subcutaneous TID WC  . levETIRAcetam  250 mg Oral BID WC  .  levothyroxine  100 mcg Oral Q0600  . linagliptin  5 mg Oral Q breakfast  . lipase/protease/amylase  48,000 Units Oral TID WC  . multivitamin  1 tablet Oral Daily  . pantoprazole  40 mg Oral Q breakfast  . polyethylene glycol  17 g Oral Daily  . prednisoLONE acetate  1 drop Both Eyes QHS  . sevelamer carbonate  2,400 mg Oral TID WC  . traZODone  50 mg Oral QHS    Dialysis Orders: (Under the care of Dr. Justin Mend in [place]) CCPD, EDW 56.5 kg, 2.5 calcium/0.5 magnesium dialysate with variable dextrose. 4 exchanges, 2 L fill volume, 90-minute dwell time, last fill volume 0 cc, 0 daytime exchanges, 0 daytime dwell.  Assessment/Plan: 1. Peritonitis in setting of peritoneal dialysis: Cell count 1133, gram stain without clear bacteria present - PD fluid Cx prelim + GPC. S/pIP Fortazin addition to IVvancomycin. Fortaz discontinued yesterday. Currently alert and  oriented. She reports that this is her 1st episode of peritonitis. 2. Alerted mental status: Present on admission. Hx CVA/dementia. Alert and oriented today, though does have some long term recall deficits. Per previous notes, needs PT eval to make sure she is strong enough to complete dialysis and ADLs before discharged.  3. ESRD: Reports no issues with dialysis last night. Continue nightly CCPD. K+ 3.9. 4. HTN/volume:  BP slightly soft this AM, asymptomatic. Will conitnue combination of 1.5% and 2.5% exchanges during CCPD. Losartan on hold per primary.  5. Anemia: Hgb 8.0. No ESA at this time. Significant drop from 10.0, Hgb variable since admission. Denies any overt bleeding. Will repeat CBC in AM.  6. Secondary hyperparathyroidism:  Corrected calcium 9.8, phos 3.2. Continue sevelamer.  7. Nutrition:  Albumin 1.4. Continue supplement.  8. Hx CAD, CVA: Per primary. On aspirin, plavix and atorvastatin. 9. T2DM: Management per primary.   Samantha Paganini, PA-C 09/16/2018, 11:04 AM  Samantha Conrad Pager: [phone]  I have seen and examined this patient and agree with plan and assessment in the above note with renal recommendations/intervention highlighted.  She is improving but will need to assess her ability to go home safely.  Her daughter helps load her fluid bags for CCPD each night but the patient sets up the machine herself.  If she will require SNF placement, she will need to transition to in-center HD until she is strong enough to go home safely, unless she is a candidate for CIR here at [facility].  Otherwise, she will need a tunneled HD catheter and set up outpatient HD in [place].  Samantha Rooks Pandora Mccrackin,MD 09/16/2018 2:31 PM

## 2018-09-16 NOTE — Progress Notes (Deleted)
{Choose 1 Note Type (Telehealth Visit or Telephone Visit):[phone]}   Evaluation Performed:  Follow-up visit  Date:  09/16/2018   ID:  Samantha Conrad, DOB Mar 15, 1941, MRN [id number]  {Patient Location:[phone]::"Home"} {Provider Location:[phone]}  PCP:  [facility]  Cardiologist:  Quay Burow, MD  Electrophysiologist:  None   Chief Complaint:  ***  History of Present Illness:    Samantha Conrad is a 78 y.o. female   The patient {does/does not:200015} have symptoms concerning for COVID-19 infection (fever, chills, cough, or new shortness of breath).    Past Medical History:  Diagnosis Date  . Anemia   . Basal cell carcinoma     on nose/face  . CAD (coronary artery disease)    a. 08/2018: posterior STEMI s/p DES to LCx x2  . Depression   . ESRD on peritoneal dialysis ([place])    "q night" (11/29/2017)  . Fatty liver   . Hypertension   . Hypothyroidism   . NASH (nonalcoholic steatohepatitis)   . Seizures ([place])    ? when she may have had a stroke sometime in the spring of 2018  . Sleep apnea    diagnosed years ago "after I'd gained alot of weight;  no longer a problem since weight loss"  . Stroke [facility]) 04/2016   memory issues, lost 1/2 vision in both eyes  . Thrombocytopenia ([place])   . Thyroid disease   . Type II diabetes mellitus ([place])    Past Surgical History:  Procedure Laterality Date  . ABDOMINAL HYSTERECTOMY    . AV FISTULA PLACEMENT Right 10/30/2016   Procedure: INSERTION OF ARTERIOVENOUS (AV) GORE-TEX GRAFT RIGHT UPPER ARM;  Surgeon: Elam Dutch, MD;  Location: [place];  Service: Vascular;  Laterality: Right;  . CATARACT EXTRACTION W/ INTRAOCULAR LENS  IMPLANT, BILATERAL Bilateral   . CHOLECYSTECTOMY OPEN    . CORNEAL TRANSPLANT Bilateral   . CORONARY ANGIOGRAPHY N/A 09/06/2018   Procedure: CORONARY ANGIOGRAPHY;  Surgeon: Lorretta Harp, MD;  Location: [place] CV LAB;  Service: Cardiovascular;  Laterality: N/A;  .  CORONARY/GRAFT ACUTE MI REVASCULARIZATION N/A 09/06/2018   Procedure: Coronary/Graft Acute MI Revascularization;  Surgeon: Lorretta Harp, MD;  Location: [place] CV LAB;  Service: Cardiovascular;  Laterality: N/A;  . EYE SURGERY    . KNEE ARTHROSCOPY Left   . LEFT HEART CATH AND CORONARY ANGIOGRAPHY N/A 09/06/2018   Procedure: LEFT HEART CATH AND CORONARY ANGIOGRAPHY;  Surgeon: Lorretta Harp, MD;  Location: [place] CV LAB;  Service: Cardiovascular;  Laterality: N/A;  . MOHS SURGERY     "nose"  . SHOULDER OPEN ROTATOR CUFF REPAIR Left   . THROMBECTOMY AND REVISION OF ARTERIOVENTOUS (AV) GORETEX  GRAFT Right 12/04/2016   Procedure: THROMBECTOMY/ REVISION OF RIGHT UPPER ARM ARTERIOVENOUS GORETEX GRAFT;  Surgeon: Elam Dutch, MD;  Location: [place];  Service: Vascular;  Laterality: Right;  . TONSILLECTOMY       No outpatient medications have been marked as taking for the 09/17/18 encounter (Appointment) with Lendon Colonel, NP.     Allergies:   Contrast media [iodinated diagnostic agents] and Tape   Social History   Tobacco Use  . Smoking status: Never Smoker  . Smokeless tobacco: Former Systems developer    Types: Snuff  . Tobacco comment: "no snuff since < 1980s"  Substance Use Topics  . Alcohol use: Never    Frequency: Never  . Drug use: Never     Family Hx: The patient's family history  is not on file.  ROS:   Please see the history of present illness.    *** All other systems reviewed and are negative.   Prior CV studies:   The following studies were reviewed today:  ***  Labs/Other Tests and Data Reviewed:    EKG:  {EKG/Telemetry Strips Reviewed:[phone]}  Recent Labs: 09/05/2018: Magnesium 1.6 09/07/2018: TSH 3.439 09/13/2018: ALT 18 09/16/2018: BUN 37; Creatinine, Ser 4.13; Hemoglobin 8.0; Platelets 118; Potassium 3.9; Sodium 131   Recent Lipid Panel Lab Results  Component Value Date/Time   CHOL 160 09/05/2018 11:48 PM   TRIG 159 (H) 09/05/2018 11:48  PM   HDL 74 09/05/2018 11:48 PM   CHOLHDL 2.2 09/05/2018 11:48 PM   LDLCALC 54 09/05/2018 11:48 PM    Wt Readings from Last 3 Encounters:  09/16/18 145 lb 11.6 oz (66.1 kg)  09/08/18 136 lb (61.7 kg)  06/18/18 121 lb (54.9 kg)     Objective:    Vital Signs:  There were no vitals taken for this visit.   {HeartCare Virtual Exam (Optional):[phone]::"VITAL SIGNS:  reviewed"}  ASSESSMENT & PLAN:    1. ***  COVID-19 Education: The signs and symptoms of COVID-19 were discussed with the patient and how to seek care for testing (follow up with PCP or arrange E-visit).  ***The importance of social distancing was discussed today.  Time:   Today, I have spent *** minutes with the patient with telehealth technology discussing the above problems.     Medication Adjustments/Labs and Tests Ordered: Current medicines are reviewed at length with the patient today.  Concerns regarding medicines are outlined above.   Tests Ordered: No orders of the defined types were placed in this encounter.   Medication Changes: No orders of the defined types were placed in this encounter.   Disposition:  Follow up {follow up:15908}  Signed, Phill Myron. [place] Pugh, ANP, AACC  09/16/2018 11:44 AM      [facility]

## 2018-09-17 ENCOUNTER — Ambulatory Visit: Payer: Medicare Other | Admitting: Adult Health

## 2018-09-17 LAB — CBC
HCT: 29.3 % — ABNORMAL LOW (ref 36.0–46.0)
Hemoglobin: 8.8 g/dL — ABNORMAL LOW (ref 12.0–15.0)
MCH: 27.2 pg (ref 26.0–34.0)
MCHC: 30 g/dL (ref 30.0–36.0)
MCV: 90.4 fL (ref 80.0–100.0)
Platelets: 116 10*3/uL — ABNORMAL LOW (ref 150–400)
RBC: 3.24 MIL/uL — ABNORMAL LOW (ref 3.87–5.11)
RDW: 14.5 % (ref 11.5–15.5)
WBC: 7.2 10*3/uL (ref 4.0–10.5)
nRBC: 0 % (ref 0.0–0.2)

## 2018-09-17 LAB — RENAL FUNCTION PANEL
Albumin: 1.5 g/dL — ABNORMAL LOW (ref 3.5–5.0)
Anion gap: 13 (ref 5–15)
BUN: 42 mg/dL — ABNORMAL HIGH (ref 8–23)
CO2: 26 mmol/L (ref 22–32)
Calcium: 8 mg/dL — ABNORMAL LOW (ref 8.9–10.3)
Chloride: 93 mmol/L — ABNORMAL LOW (ref 98–111)
Creatinine, Ser: 4.25 mg/dL — ABNORMAL HIGH (ref 0.44–1.00)
GFR calc Af Amer: 11 mL/min — ABNORMAL LOW (ref >60)
GFR calc non Af Amer: 9 mL/min — ABNORMAL LOW (ref >60)
Glucose, Bld: 139 mg/dL — ABNORMAL HIGH (ref 70–99)
Phosphorus: 2.6 mg/dL (ref 2.5–4.6)
Potassium: 4 mmol/L (ref 3.5–5.1)
Sodium: 132 mmol/L — ABNORMAL LOW (ref 135–145)

## 2018-09-17 LAB — VANCOMYCIN, RANDOM: Vancomycin Rm: 26

## 2018-09-17 LAB — GLUCOSE, CAPILLARY
Glucose-Capillary: 134 mg/dL — ABNORMAL HIGH (ref 70–99)
Glucose-Capillary: 155 mg/dL — ABNORMAL HIGH (ref 70–99)
Glucose-Capillary: 105 mg/dL — ABNORMAL HIGH (ref 70–99)

## 2018-09-17 LAB — PATHOLOGIST SMEAR REVIEW

## 2018-09-17 MED ORDER — VANCOMYCIN VARIABLE DOSE PER UNSTABLE RENAL FUNCTION (PHARMACIST DOSING): Status: DC

## 2018-09-17 NOTE — Progress Notes (Signed)
Pharmacy Antibiotic Note  LAVELLE BERLAND is a 78 y.o. female admitted on 09/13/2018 with peritonitis in setting of PD. Pharmacy has been consulted to dose Vancomycin. Today is day 5 of therapy out of 14. PD WBC 1133 and PD fluid grew Staphylococcus Epidermidis, resistant to cipro, oxacillin, and tetracycline.  Vancomycin random level today was 26. Will not redose since level is >20.  Plan: Next dose of vancomycin not yet due Monitor PD, recheck vanc lvls Thurs, redose when < 20  Height: 5' 1"  (154.9 cm) Weight: 143 lb 1.3 oz (64.9 kg) IBW/kg (Calculated) : 47.8  Temp (24hrs), Avg:99 F (37.2 C), Min:98.4 F (36.9 C), Max:100.3 F (37.9 C)  Recent Labs  Lab 09/13/18 1335 09/13/18 1403 09/14/18 0412 09/15/18 0649 09/15/18 1114 09/16/18 0656 09/17/18 0625  WBC 9.0  --  7.7 7.0  --  5.6 7.2  CREATININE 4.76*  --  4.87* 3.82*  --  4.13* 4.25*  LATICACIDVEN  --  1.5  --   --   --   --   --   VANCORANDOM  --   --   --   --  18  --  26    Estimated Creatinine Clearance: 9.3 mL/min (A) (by C-G formula based on SCr of 4.25 mg/dL (H)).    Allergies  Allergen Reactions  . Contrast Media [Iodinated Diagnostic Agents] Swelling    SWELLING REACTION UNSPECIFIED   . Tape Other (See Comments)    Tears skin - please use paper tape   Antimicrobials this admission: Vanc IV 4/17>> Ceftaz 4/18 x1  Dosage Adjustments: 4/19 VR 18 - redosed w/ vanc 1000 mg IV x1 4/21 VR 26 - will not re-dose vanc  Microbiology Results: 4/17 blood x 2: NGTD 4/17 urine: insignificant growth 4/17 PD fluid: Staph epi - R cipro, oxacillin, tetracycline  Jackson Latino, PharmD PGY1 Pharmacy Resident Phone [phone] 09/17/2018     9:15 AM

## 2018-09-17 NOTE — Discharge Instructions (Signed)
You were admitted and treated for an infection called Spontaneous bacterial peritonitis. Please make sure to continue your antibiotics as prescribed. It is very important you follow up with nephrology and your primary care doctor within 1 week of discharge.   If you have worsening abdominal pain, fever, shortness of breath, or chest pain come to the ED.   The nephrology team will call you to set up Norco for IV Vancomycin (the antibiotic for the SBP) that you will need to continue for a total course of 14 days.

## 2018-09-17 NOTE — Progress Notes (Signed)
Occupational Therapy Evaluation Patient Details Name: Samantha Conrad MRN: [id number] DOB: 1940-07-14 Today's Date: 09/17/2018    History of Present Illness Samantha Conrad is a 78 y.o. female presenting with SBP in setting of peritoneal peritonitis. PMH is significant for  has a past medical history of Anemia, Basal cell carcinoma, CAD (coronary artery disease), Depression, ESRD on peritoneal dialysis ([place]), Fatty liver, Hypertension, Hypothyroidism, NASH (nonalcoholic steatohepatitis), Seizures ([place]), Sleep apnea, Stroke ([place]) (04/2016), Thrombocytopenia ([place]), Thyroid disease, and Type II diabetes mellitus ([place]). history of SBP.    Clinical Impression   Pt admitted with above. PTA, pt lives alone but daughter lives 2 houses down. Her daughter comes over daily and brings dinner.  Pt performing ADLs at supervision-min assist level.  Pt reports feeling more fatigued than her baseline after completing bathing,dressing and grooming ADLs. Pt will benefit from continued acute OT services to maximize safety and independence with ADLs. Recommend 24/7 supervision/assist initially upon return home.    Follow Up Recommendations  No OT follow up;Supervision/Assistance - 24 hour    Equipment Recommendations  None recommended by OT    Recommendations for Other Services       Precautions / Restrictions Precautions Precautions: Fall      Mobility Bed Mobility Overal bed mobility: Modified Independent                Transfers Overall transfer level: Needs assistance Equipment used: None Transfers: Sit to/from Stand Sit to Stand: Min guard         General transfer comment: min guard for safety    Balance                                           ADL either performed or assessed with clinical judgement   ADL Overall ADL's : Needs assistance/impaired Eating/Feeding: Modified independent;Sitting   Grooming: Wash/dry hands;Oral care;Wash/dry  face;Supervision/safety;Standing   Upper Body Bathing: Supervision/ safety;Sitting   Lower Body Bathing: Min guard;Sit to/from stand   Upper Body Dressing : Minimal assistance;Sitting   Lower Body Dressing: Minimal assistance;Sit to/from stand   Toilet Transfer: Min guard;RW;Regular Toilet;Grab bars   Toileting- Water quality scientist and Hygiene: Min guard;Sit to/from stand       Functional mobility during ADLs: Min guard;Rolling walker       Vision Baseline Vision/History: (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) Patient Visual Report: No change from baseline       Perception     Praxis      Pertinent Vitals/Pain Pain Assessment: No/denies pain     Hand Dominance Right   Extremity/Trunk Assessment Upper Extremity Assessment Upper Extremity Assessment: Generalized weakness   Lower Extremity Assessment Lower Extremity Assessment: Defer to PT evaluation   Cervical / Trunk Assessment Cervical / Trunk Assessment: Normal   Communication Communication Communication: No difficulties   Cognition Arousal/Alertness: Awake/alert Behavior During Therapy: WFL for tasks assessed/performed Overall Cognitive Status: Within Functional Limits for tasks assessed                                     General Comments       Exercises     Shoulder Instructions      Home Living Family/patient expects to be discharged to:: Private residence Living Arrangements: Alone Available Help at Discharge: Family;Available PRN/intermittently Type of Home:  House Home Access: Stairs to enter [employer] of Steps: 1 Entrance Stairs-Rails: None Home Layout: One level     Bathroom Shower/Tub: Teacher, early years/pre: Standard Bathroom Accessibility: Yes   Home Equipment: Hand held shower head;Grab bars - toilet;Walker - 2 wheels;Shower seat;Toilet riser          Prior Functioning/Environment Level of Independence: Needs assistance   Gait / Transfers Assistance Needed: doesn't typical use AD ADL's / Homemaking Assistance Needed: makes breakfast, does laundry, daughter makes her dinner every night and makes sure she eats lunch            OT Problem List: Decreased strength;Decreased activity tolerance;Impaired balance (sitting and/or standing);Decreased knowledge of use of DME or AE      OT Treatment/Interventions: Self-care/ADL training;DME and/or AE instruction;Therapeutic activities;Patient/family education;Balance training    OT Goals(Current goals can be found in the care plan section) Acute Rehab OT Goals Patient Stated Goal: home OT Goal Formulation: With patient Time For Goal Achievement: 10/01/18 Potential to Achieve Goals: Good  OT Frequency: Min 2X/week   Barriers to D/C:            Co-evaluation              AM-PAC OT "6 Clicks" Daily Activity     Outcome Measure Help from another person eating meals?: None Help from another person taking care of personal grooming?: A Little Help from another person toileting, which includes using toliet, bedpan, or urinal?: A Little Help from another person bathing (including washing, rinsing, drying)?: A Little Help from another person to put on and taking off regular upper body clothing?: A Little Help from another person to put on and taking off regular lower body clothing?: A Little 6 Click Score: 19   End of Session Equipment Utilized During Treatment: Rolling walker Nurse Communication: Mobility status  Activity Tolerance: Patient tolerated treatment well Patient left: in bed;with call bell/phone within reach  OT Visit Diagnosis: Unsteadiness on feet (R26.81);Muscle weakness (generalized) (M62.81)                Time: 2505-3976 OT Time Calculation (min): 38 min Charges:  OT General Charges $OT Visit: 1 Visit OT Evaluation $OT Eval Moderate Complexity: 1 Mod OT Treatments $Self Care/Home Management : 23-37 mins    Darrol Jump OTR/L  [place] [phone] 09/17/2018, 4:45 PM

## 2018-09-17 NOTE — Progress Notes (Signed)
Family Medicine Teaching Service Daily Progress Note Intern Pager: [phone]  Patient name: Samantha Conrad Medical record number: [id number] Date of birth: [birth year] Age: 78 y.o. Gender: female  Primary Care Provider: Physicians, [facility] Family Consultants: Nephrology  Code Status: Partial Code   Pt Overview and Major Events to Date:  Admitted: 09/13/2018 for CC: abdominal pain   Hospital Day: 5 09/13/18: paracentesis, started IV Vanc, Ceftazodine (4/17-18) 4/19: weaned to RA, sp feraheme    Assessment and Plan: Samantha Conrad is a 78 y.o. female who presented w/ abdominal pain and admitted for SBP in setting of peritoneal dialysis. Samantha Conrad  has a past medical history of Anemia, Basal cell carcinoma, CAD (coronary artery disease), Depression, ESRD on peritoneal dialysis, Fatty liver, Hypertension, Hypothyroidism, NASH (nonalcoholic steatohepatitis), Seizures, Sleep apnea, Stroke (04/2016), Thrombocytopenia, Thyroid disease, and Type II diabetes mellitus.   RENAL  # SBP in setting of peritoneal dialysis Patient remains afebrile, denies nausea vomiting.  This morning, she reports that she feels okay but does not feel very strong.  She is working with PT at the time.  Spoke to nephrology yesterday who was in agreement with IV vancomycin for 14 days for SBP that showed coag negative staph.  We will continue to watch patient's strength for safe discharge with home health.  PT to continue working with patient until she is no longer 24-hour supervision.  Per nephrology, should patient strength not improve, other discharge options would include SNF, but she would need to get temporary access for HD.  Continue (4/17-4/30) IV Vancomycin per pharmacy - recs for 14 day course   Norco 1 to 2 tablets every 4 hours as needed pain  PT following daily  # ESRD/ Secondary hyperparathyroidism  Urine output yesterday 400 mL.  Foss within normal limits at 2.6.  Corrected calcium 10.0.  Blood pressures  are stable at 106-126/51-62.  Daily peritoneal dialysis  F/u Nepho recs  Strict I/O   Continue sevelamer 3 times daily WC  #ACD in setting of ESRD  Hemoglobin is 8.8, up from 8.0.   Daily MVI   Continue to monitor on daily CBC  Nephrology following   CARDIOLOGY # CAD s/p STEMI (08/2018) # Hx CVA (04/2016)  Continue aspirin 81, Plavix, atorvastatin  # HTN At home, on lasix, midodrine for hypotension, KDUR 20 BID, losartan 31m.   Continue carvedilol 3.125 twice daily  Holding losartan   ENDOCRINOLOGY  # DM II At home, glipizide 5 mg. CBGs ranging from 95-272.  2 units in the last 24 hours.  CBGs ranging from 102-345.  Continue linagliptin 5 mg and SSI 3 times daily WC  #Hypothyroidism  Continue levothyroxine 100 mcg   NEUROLOGY # Seizure disorder  Continue Keppra twice daily  # Anxiety/Depression   Continue citalopram 10 mg daily, trazodone 50 mg nightly  GI # PUD  Continue pantoprazole 40 mg daily  # Chronic Pancreatitis  Continue Creon 40,000 units 3 times daily WC  #Consitpation  Had a good BM this morning.   PEG daily, PRN zofran  HEME #Throbocytopenia, chronic  Platelets range in low 100's. 116 today  #FEN:   Fluids:  Fluid restriction 12028m  Electrolytes: KVO    Nutrition: Renal/Carb Modf   VTE prophylaxis: Heparin 5000 Q8H (CrCl <30)   Disposition: Home pending PT recommendations and safe discharge plan ================================================================ Subjective:  NAEO.  Patient's feels tired this morning.  Objective: BP (!) 118/51 (BP Location: Left Arm)   Pulse 76   Temp 98.4  F (36.9 C) (Oral)   Resp 18   Ht 5' 1"  (1.549 m)   Wt 64.9 kg   SpO2 93%   BMI 27.03 kg/m  Intake/Output      04/20 0701 - 04/21 0700 04/21 0701 - 04/22 0700   P.O. 1020    Other 8007    Total Intake(mL/kg) 9027 (139.1)    Urine (mL/kg/hr) 400 (0.3)    Emesis/NG output 0    Other 8284    Stool 0    Total  Output 8684    Net +343         Urine Occurrence 1 x    Stool Occurrence 0 x    Emesis Occurrence 0 x     Physical Exam: General: NAD, non-toxic, well-appearing, walking with walker with physical therapy. Respiratory: No IWOB.  Extremities: Warm and well perfused. Moving spontaneously.    Laboratory: I have personally read and reviewed all labs and imaging studies.  CBC: Recent Labs  Lab 09/13/18 1335 09/14/18 0412 09/15/18 0649 09/16/18 0656 09/17/18 0625  WBC 9.0 7.7 7.0 5.6 7.2  NEUTROABS 7.1  --   --   --   --   HGB 11.8* 9.0* 10.0* 8.0* 8.8*  HCT 39.4 30.6* 33.5* 26.5* 29.3*  MCV 91.8 93.3 92.5 90.1 90.4  PLT 112* 115* 126* 118* 248*   Basic Metabolic Panel: Recent Labs  Lab 09/13/18 1335 09/14/18 0412 09/15/18 0649 09/16/18 0656 09/17/18 0625  NA 133* 136 132* 131* 132*  K 4.3 4.3 4.1 3.9 4.0  CL 96* 101 94* 94* 93*  CO2 23 21* 27 27 26   GLUCOSE 98 108* 145* 167* 139*  BUN 48* 47* 37* 37* 42*  CREATININE 4.76* 4.87* 3.82* 4.13* 4.25*  CALCIUM 8.0* 7.8* 7.8* 7.7* 8.0*  PHOS  --  3.6 3.2 3.2 2.6   GFR: Estimated Creatinine Clearance: 9.3 mL/min (A) (by C-G formula based on SCr of 4.25 mg/dL (H)). Liver Function Tests: Recent Labs  Lab 09/13/18 1335 09/15/18 0649 09/16/18 0656 09/17/18 0625  AST 22  --   --   --   ALT 18  --   --   --   ALKPHOS 132*  --   --   --   BILITOT 0.6  --   --   --   PROT 5.4*  --   --   --   ALBUMIN 1.9* 1.7* 1.4* 1.5*   CBG: Recent Labs  Lab 09/16/18 0718 09/16/18 1143 09/16/18 1621 09/16/18 2101 09/17/18 0704  GLUCAP 145* 161* 132* 248* 134*     Bld Cx x 2- NGX4 DAYS  Imaging/Diagnostic Tests: No results found.   Wilber Oliphant, MD 09/17/2018, 9:58 AM PGY-1, [place] Intern pager: [phone], text pages welcome

## 2018-09-17 NOTE — Progress Notes (Addendum)
[facility] Progress Note   Subjective:   Seen in room. Feeling well today, abdominal pain continues to improve. No complications with PD last night, BP controlled. Afebrile. Denies dizziness, SOB, CP, N/V/D, constipation, headaches. Hoping to continue PD at home on discharge however PT recommending 24 hour assistance at this time. Patient does have a failed RUE AVG with no thrill or bruit.   Objective Vitals:   09/16/18 1805 09/16/18 2101 09/17/18 0515 09/17/18 0900  BP: 126/61 115/62 (!) 118/51 (!) 119/55  Pulse: 74 69 76 76  Resp: 18 18 18 18   Temp: 98.8 F (37.1 C) 98.6 F (37 C) 98.4 F (36.9 C) 98 F (36.7 C)  TempSrc: Oral Oral Oral Oral  SpO2: 95% 98% 93% 96%  Weight:   64.9 kg   Height:       Physical Exam General: Well developed female, alert, in NAD Heart: RRR, no murmurs, rubs or gallops Lungs:CTA bilaterally without wheezing, rhonchi or rales Abdomen: Soft, non-distended. Mild tenderness R lower quadrant. No rebound tenderness.  Extremities: No edema bilateral lower extremities.  Dialysis Access: PD cath RLQ without erythema or drainage. RUE AVG without thrill or bruit.   Additional Objective Labs: Basic Metabolic Panel: Recent Labs  Lab 09/15/18 0649 09/16/18 0656 09/17/18 0625  NA 132* 131* 132*  K 4.1 3.9 4.0  CL 94* 94* 93*  CO2 27 27 26   GLUCOSE 145* 167* 139*  BUN 37* 37* 42*  CREATININE 3.82* 4.13* 4.25*  CALCIUM 7.8* 7.7* 8.0*  PHOS 3.2 3.2 2.6   Liver Function Tests: Recent Labs  Lab 09/13/18 1335 09/15/18 0649 09/16/18 0656 09/17/18 0625  AST 22  --   --   --   ALT 18  --   --   --   ALKPHOS 132*  --   --   --   BILITOT 0.6  --   --   --   PROT 5.4*  --   --   --   ALBUMIN 1.9* 1.7* 1.4* 1.5*   Recent Labs  Lab 09/13/18 1335  LIPASE 17   CBC: Recent Labs  Lab 09/13/18 1335 09/14/18 0412 09/15/18 0649 09/16/18 0656 09/17/18 0625  WBC 9.0 7.7 7.0 5.6 7.2  NEUTROABS 7.1  --   --   --   --   HGB 11.8* 9.0*  10.0* 8.0* 8.8*  HCT 39.4 30.6* 33.5* 26.5* 29.3*  MCV 91.8 93.3 92.5 90.1 90.4  PLT 112* 115* 126* 118* 116*   Blood Culture    Component Value Date/Time   SDES FLUID PERITONEAL 09/13/2018 1656   SDES FLUID PERITONEAL 09/13/2018 1656   SPECREQUEST NONE 09/13/2018 1656   SPECREQUEST BOTTLES DRAWN AEROBIC AND ANAEROBIC 09/13/2018 1656   CULT STAPHYLOCOCCUS EPIDERMIDIS (A) 09/13/2018 1656   REPTSTATUS 09/13/2018 FINAL 09/13/2018 1656   REPTSTATUS 09/16/2018 FINAL 09/13/2018 1656    CBG: Recent Labs  Lab 09/16/18 0718 09/16/18 1143 09/16/18 1621 09/16/18 2101 09/17/18 0704  GLUCAP 145* 161* 132* 248* 134*    Studies/Results: No results found. Medications: . dialysis solution 1.5% low-MG/low-CA    . dialysis solution 1.5% low-MG/low-CA    . dialysis solution 2.5% low-MG/low-CA     . aspirin EC  81 mg Oral Q breakfast  . atorvastatin  40 mg Oral q1800  . carvedilol  3.125 mg Oral BID WC  . citalopram  10 mg Oral Q breakfast  . clopidogrel  75 mg Oral Daily  . docusate sodium  100 mg Oral Q lunch  .  feeding supplement (NEPRO CARB STEADY)  237 mL Oral Q lunch  . feeding supplement (PRO-STAT SUGAR FREE 64)  30 mL Oral BID  . gentamicin cream  1 application Topical Daily  . heparin injection (subcutaneous)  5,000 Units Subcutaneous Q8H  . insulin aspart  0-9 Units Subcutaneous TID WC  . levETIRAcetam  250 mg Oral BID WC  . levothyroxine  100 mcg Oral Q0600  . linagliptin  5 mg Oral Q breakfast  . lipase/protease/amylase  48,000 Units Oral TID WC  . multivitamin  1 tablet Oral Daily  . pantoprazole  40 mg Oral Q breakfast  . polyethylene glycol  17 g Oral Daily  . prednisoLONE acetate  1 drop Both Eyes QHS  . sevelamer carbonate  2,400 mg Oral TID WC  . traZODone  50 mg Oral QHS  . vancomycin variable dose per unstable renal function (pharmacist dosing)   Does not apply See admin instructions    Dialysis Orders: (Under the care of Dr. Justin Mend in [place]) CCPD, EDW  56.5 kg, 2.5 calcium/0.5 magnesium dialysate with variable dextrose. 4 exchanges, 2 L fill volume, 90-minute dwell time, last fill volume 0 cc, 0 daytime exchanges, 0 daytime dwell.  Assessment/Plan: 1. Peritonitis in setting of peritoneal dialysis: Cell count 1133, gram stain without clear bacteria present - PD fluidCxprelim + GPC. S/pIP Fortazin addition to IVvancomycin. Fortaz discontinued 4/19. Currently alert and oriented. She reports that this is her 1st episode of peritonitis. Continue 2 weeks of vancomycin.  2. Alerted mental status: Present on admission. Hx CVA/dementia. Alert and oriented today, though does have some long term recall deficits.  3. ESRD: Reports no issues with dialysis last night. Continue nightly CCPD. K+ 4.0. 4. HTN/volume:  BP stable. No clinical evidence of volume overload on exam. Continue combination of 1.5% and 2.5% exchanges during CCPD. Losartan on hold per primary.  5. Anemia: Hgb 8.8. No ESA at this time. Hgb variable since admission. Denies any overt bleeding.  6. Secondary hyperparathyroidism:  Corrected calcium 10.0, phos 3.2. Continue sevelamer.  7. Nutrition:  Albumin 1.4. Continue supplement.  8. Hx CAD, CVA: Per primary. On aspirin, plavix and atorvastatin. 9. T2DM: Management per primary.  10. Disp: Pending. Daughter helps lift fluid bags but otherwise runs CCPD herself.  Hoping to continue PD at home but does run treatments by herself and will need to be strong/stable enough to do so.  Would need TDC for outpatient HD if unable to go home safely.   Anice Paganini, PA-C 09/17/2018, 10:45 AM  [facility] Pager: [phone]  I have seen and examined this patient and agree with plan and assessment in the above note with renal recommendations/intervention highlighted.  I spoke with Dr. Ardelia Mems and agree that Mrs. Biermann can go home with family assistance and will arrange for outpatient IP vancomycin at the hometherapies clinic in  [place] to complete the 14 day course of antibiotics.  Governor Rooks Alfonsa Vaile,MD 09/17/2018 4:44 PM

## 2018-09-17 NOTE — Discharge Summary (Signed)
Samantha Conrad  Patient name: Samantha Conrad Medical record number: [id number] Date of birth: [birth year] Age: 78 y.o. Gender: female Date of Admission: 09/13/2018  Date of Discharge: 09/17/18    Admitting Physician: Leeanne Rio, MD  Primary Care Provider: Physicians, [facility] Family Consultants: Nephrology  Indication for Hospitalization: Abdominal Pain   Discharge Diagnoses/Problem List:  Principal Problem:   Spontaneous bacterial peritonitis ([place]) Active Problems:   Cirrhosis ([place])   Symptomatic anemia   Diet-controlled diabetes mellitus ([place])   NASH (nonalcoholic steatohepatitis)   Hypertension   Hypothyroidism   ESRD on dialysis ([place])   HLD (hyperlipidemia)   Chronic pancreatitis ([place])   Acute ST elevation myocardial infarction (STEMI) of posterior wall (HCC)   CAD (coronary artery disease)   Anemia   Thrombocytopenia ([facility])   Peritonitis ([place])  Disposition: Discharge to home with home health   Discharge Condition: Stable  Discharge Exam:  BP (!) 121/53 (BP Location: Left Arm)   Pulse 77   Temp 98.1 F (36.7 C) (Oral)   Resp 16   Ht 5' 1"  (1.549 m)   Wt 63.9 kg   SpO2 96%   BMI 26.62 kg/m    General: NAD, non-toxic, well-appearing, sitting comfortably in bed.  Cardiovascular: RRR, normal S1, S2. 2+ RP bilaterally.  Respiratory: CTAB. No IWOB.  Abdomen: + BS. NT, ND, soft to palpation.  Extremities: Warm and well perfused. Moving spontaneously.   Brief Hospital Course:  Samantha Conrad is a 78 y.o. female with past medical history significant for ESRD on peritoneal dialysis daily, who presented to [facility] emergency department with chief complaint of abdominal pain, concerning for SBP in the setting of peritoneal dialysis.  Initial work up with significant for peritoneal culture positive for coag negative staph, the most common cause of SBP and peritoneal patients, that was sensitive to IV vancomycin.   CT abdomen pelvis did not show any acute findings.  Blood culture was negative x4 days and patient did not have white count.  Patient was started on 14-day course of IV vancomycin that was renally dosed by pharmacy.  She continued to do peritoneal dialysis daily.  Her abdominal pain improved after first day of antibiotics.  Her strength was monitored throughout her admission, as this was a limiting factor to discharging her home.  She lives by herself and is usually able to do her peritoneal dialysis by herself.  Her daughter lives a couple houses down from her and is able to also help take care of her.  However physical therapy was consulted and recommended 24-hour assistance until patient increased in strength.  By day of discharge, patient had increase strength, and family members were able to help her at home.  Nephrology was also consulted, and they set up home health vancomycin to continue her 14-day course for SBP.  Of note, patient was admitted to the hospital about a week prior to this admission for STEMI.  Issues for Follow Up:  1. Symptom improvement of weakness 2. IV vancomycin, monitoring over 14-day course in setting of ESRD 3. Follow-up with nephrology  Significant Procedures:  Paracentesis   Procedure Orders     ED EKG     EKG 12-Lead     EKG  Significant Labs and Imaging:  Recent Labs  Lab 09/15/18 0649 09/16/18 0656 09/17/18 0625  WBC 7.0 5.6 7.2  HGB 10.0* 8.0* 8.8*  HCT 33.5* 26.5* 29.3*  PLT 126* 118* 116*   Recent Labs  Lab 09/13/18 1335 09/14/18 0412 09/15/18 0649 09/16/18 0656 09/17/18 0625  NA 133* 136 132* 131* 132*  K 4.3 4.3 4.1 3.9 4.0  CL 96* 101 94* 94* 93*  CO2 23 21* 27 27 26   GLUCOSE 98 108* 145* 167* 139*  BUN 48* 47* 37* 37* 42*  CREATININE 4.76* 4.87* 3.82* 4.13* 4.25*  CALCIUM 8.0* 7.8* 7.8* 7.7* 8.0*  PHOS  --  3.6 3.2 3.2 2.6  ALKPHOS 132*  --   --   --   --   AST 22  --   --   --   --   ALT 18  --   --   --   --   ALBUMIN 1.9*   --  1.7* 1.4* 1.5*    Ct Abdomen Pelvis Wo Contrast  Result Date: 09/13/2018 CLINICAL DATA:  Acute right-sided abdominal pain. EXAM: CT ABDOMEN AND PELVIS WITHOUT CONTRAST TECHNIQUE: Multidetector CT imaging of the abdomen and pelvis was performed following the standard protocol without IV contrast. COMPARISON:  CT scan of March 11, 2017. FINDINGS: Lower chest: Mild bilateral pleural effusions are noted with adjacent subsegmental atelectasis. Hepatobiliary: No focal liver abnormality is seen. Status post cholecystectomy. No biliary dilatation. Pancreas: Stable pancreatic calcifications are noted consistent with chronic pancreatitis. No acute inflammation or ductal dilatation is noted. Spleen: Normal in size without focal abnormality. Adrenals/Urinary Tract: Adrenal glands appear normal. Stable right renal cyst is noted. Bilateral renal atrophy is noted consistent with history of end-stage renal disease. No hydronephrosis or renal obstruction is noted. No renal or ureteral calculi are noted. Urinary bladder is unremarkable. Stomach/Bowel: The appendix appears unremarkable. There is no evidence of bowel obstruction. Sigmoid diverticulosis is noted without inflammation. Wall thickening of gastric antrum is noted which may represent peptic ulcer disease. Vascular/Lymphatic: Aortic atherosclerosis. No enlarged abdominal or pelvic lymph nodes. Reproductive: Status post hysterectomy. No adnexal masses. Other: Mild pneumoperitoneum is noted in the epigastric region which most likely is related to peritoneal dialysis. Distal tip of dialysis catheter is seen in the pelvis. Musculoskeletal: No acute or significant osseous findings. IMPRESSION: Mild pneumoperitoneum is noted in epigastric region which most likely is related to peritoneal dialysis. Mild wall thickening of gastric antrum is noted which may represent peptic ulcer disease. Clinical correlation and endoscopy may be performed for further evaluation. Stable  pancreatic calcifications are noted consistent with chronic pancreatitis. Mild bilateral pleural effusions are noted with adjacent subsegmental atelectasis. Aortic Atherosclerosis (ICD10-I70.0). Electronically Signed   By: Marijo Conception M.D.   On: 09/13/2018 14:21   Dg Chest Port 1 View  Result Date: 09/06/2018 CLINICAL DATA:  Chest pain. EXAM: PORTABLE CHEST 1 VIEW COMPARISON:  Frontal and lateral views 12/11/2017 FINDINGS: Low lung volumes.The cardiomediastinal contours are normal. Minimal right lung base atelectasis. Pulmonary vasculature is normal. No consolidation, pleural effusion, or pneumothorax. No acute osseous abnormalities are seen. IMPRESSION: Low lung volumes with minimal right lung base atelectasis. Electronically Signed   By: Keith Rake M.D.   On: 09/06/2018 00:16   Discharge Medications:  Allergies as of 09/17/2018      Reactions   Contrast Media [iodinated Diagnostic Agents] Swelling   SWELLING REACTION UNSPECIFIED    Tape Other (See Comments)   Tears skin - please use paper tape      Medication List    STOP taking these medications   calcitRIOL 0.25 MCG capsule Commonly known as:  ROCALTROL   calcium acetate 667 MG capsule Commonly known as:  PHOSLO  losartan 25 MG tablet Commonly known as:  COZAAR     TAKE these medications   acetaminophen-codeine 300-30 MG tablet Commonly known as:  TYLENOL #3 Take 1-2 tablets by mouth every 6 (six) hours as needed for moderate pain.   aspirin 81 MG chewable tablet Chew 1 tablet (81 mg total) by mouth daily. What changed:  Another medication with the same name was removed. Continue taking this medication, and follow the directions you see here.   atorvastatin 40 MG tablet Commonly known as:  LIPITOR Take 1 tablet (40 mg total) by mouth daily at 6 PM. What changed:  when to take this   carvedilol 3.125 MG tablet Commonly known as:  COREG Take 1 tablet (3.125 mg total) by mouth 2 (two) times daily with a meal.    citalopram 10 MG tablet Commonly known as:  CELEXA Take 10 mg by mouth daily with breakfast.   clopidogrel 75 MG tablet Commonly known as:  PLAVIX Take 1 tablet (75 mg total) by mouth daily. What changed:  when to take this   Creon 12000 units Cpep capsule Generic drug:  lipase/protease/amylase Take 48,000 Units by mouth 3 (three) times daily with meals.   docusate sodium 100 MG capsule Commonly known as:  COLACE Take 100 mg by mouth daily with lunch.   feeding supplement (NEPRO CARB STEADY) Liqd Take 237 mLs by mouth daily. What changed:  when to take this   feeding supplement (PRO-STAT SUGAR FREE 64) Liqd Take 30 mLs by mouth 2 (two) times daily.   furosemide 40 MG tablet Commonly known as:  LASIX Take 40 mg by mouth 2 (two) times daily with breakfast and lunch.   glipiZIDE 5 MG tablet Commonly known as:  GLUCOTROL Take 5 mg by mouth daily before breakfast.   levETIRAcetam 250 MG tablet Commonly known as:  KEPPRA Take 250 mg by mouth 2 (two) times daily with a meal.   levothyroxine 100 MCG tablet Commonly known as:  SYNTHROID Take 100 mcg by mouth daily before breakfast.   linagliptin 5 MG Tabs tablet Commonly known as:  TRADJENTA Take 5 mg by mouth daily with breakfast.   midodrine 5 MG tablet Commonly known as:  PROAMATINE Take 5 mg by mouth daily as needed (SBP <110).   multivitamin Tabs tablet Take 1 tablet by mouth daily. What changed:  when to take this   nitroGLYCERIN 0.4 MG SL tablet Commonly known as:  NITROSTAT Place 0.4 mg under the tongue every 5 (five) minutes as needed for chest pain.   pantoprazole 40 MG tablet Commonly known as:  PROTONIX Take 40 mg by mouth daily with breakfast.   polyethylene glycol 17 g packet Commonly known as:  MIRALAX / GLYCOLAX Take 17 g by mouth daily.   potassium chloride SA 20 MEQ tablet Commonly known as:  K-DUR Take 1 tablet (20 mEq total) by mouth 2 (two) times daily. What changed:  when to take  this   prednisoLONE acetate 1 % ophthalmic suspension Commonly known as:  PRED FORTE Place 1 drop into both eyes at bedtime.   sevelamer carbonate 800 MG tablet Commonly known as:  RENVELA Take 2,400 mg by mouth 3 (three) times daily with meals.   traZODone 50 MG tablet Commonly known as:  DESYREL Take 50 mg by mouth at bedtime.            Home Infusion Instuctions  (From admission, onward)         Start  Ordered   09/17/18 0000  Home infusion instructions [facility] May follow [place] Dosing Protocol; May administer Cathflo as needed to maintain patency of vascular access device.; Flushing of vascular access device: per [facility] Protocol: 0.9% NaCl pre/post medica...    Question Answer Comment  Instructions May follow [place] Dosing Protocol   Instructions May administer Cathflo as needed to maintain patency of vascular access device.   Instructions Flushing of vascular access device: per [facility] Protocol: 0.9% NaCl pre/post medication administration and prn patency; Heparin 100 u/ml, 69m for implanted ports and Heparin 10u/ml, 517mfor all other central venous catheters.   Instructions May follow [facility] Anaphylaxis Protocol for First Dose Administration in the home: 0.9% NaCl at 25-50 ml/hr to maintain IV access for protocol meds. Epinephrine 0.3 ml IV/IM PRN and Benadryl 25-50 IV/IM PRN s/s of anaphylaxis.   Instructions [facility] Infusion Coordinator (RN) to assist per patient IV care needs in the home PRN.      09/17/18 1628          Discharge Instructions: Please refer to Patient Instructions section of EMR for full details.  Patient was counseled important signs and symptoms that should prompt return to medical care, changes in medications, dietary instructions, activity restrictions, and follow up appointments.   Follow-Up Appointments: No future appointments.   KiWilber OliphantMD 09/17/2018, 4:59 PM PGY-1, [place]

## 2018-09-17 NOTE — Care Management Important Message (Signed)
Important Message  Patient Details  Name: Samantha Conrad MRN: [id number] Date of Birth: 1941/05/25   Medicare Important Message Given:  Yes    Orbie Pyo 09/17/2018, 2:36 PM

## 2018-09-17 NOTE — Progress Notes (Signed)
Physical Therapy Treatment Patient Details Name: Samantha Conrad MRN: [id number] DOB: 01/07/1941 Today's Date: 09/17/2018    History of Present Illness Samantha Conrad is a 78 y.o. female presenting with SBP in setting of peritoneal peritonitis. PMH is significant for  has a past medical history of Anemia, Basal cell carcinoma, CAD (coronary artery disease), Depression, ESRD on peritoneal dialysis ([place]), Fatty liver, Hypertension, Hypothyroidism, NASH (nonalcoholic steatohepatitis), Seizures ([place]), Sleep apnea, Stroke ([place]) (04/2016), Thrombocytopenia ([place]), Thyroid disease, and Type II diabetes mellitus ([place]). history of SBP.     PT Comments    Patient progressing very well with therapy today. S level near Mod I with RW, contact guard without AD. Ambulated 350' without rest break today. Encouraged to request several more walking sessions today with staff and discussed with RN to progress to [place].  Pt reports she is feeling much better  today than prior several days,  but not yet at baseline.     Follow Up Recommendations  Home health PT;Supervision/Assistance - 24 hour     Equipment Recommendations  None recommended by PT    Recommendations for Other Services       Precautions / Restrictions Precautions Precautions: Fall Restrictions Weight Bearing Restrictions: No    Mobility  Bed Mobility Overal bed mobility: Modified Independent             General bed mobility comments: HOB elevated, used bed rail, increased time  Transfers Overall transfer level: Needs assistance Equipment used: None Transfers: Sit to/from Stand Sit to Stand: Min guard         General transfer comment: min guard for initially unsteadiness upon standing  Ambulation/Gait Ambulation/Gait assistance: Min guard;Supervision Gait Distance (Feet): 350 Feet Assistive device: Rolling walker (2 wheeled);1 person hand held assist Gait Pattern/deviations: Step-through pattern;Decreased stride  length Gait velocity: decreased   General Gait Details: improvement from yesterday, ambulating with and witohut RW. more independent and safe with RW, contact guard level for without RW   Stairs             Wheelchair Mobility    Modified Rankin (Stroke Patients Only)       Balance Overall balance assessment: Needs assistance Sitting-balance support: Feet supported;No upper extremity supported Sitting balance-Leahy Scale: Good     Standing balance support: Single extremity supported Standing balance-Leahy Scale: Poor Standing balance comment: more steady with bilat UE support                            Cognition Arousal/Alertness: Awake/alert Behavior During Therapy: WFL for tasks assessed/performed Overall Cognitive Status: Within Functional Limits for tasks assessed                                        Exercises      General Comments        Pertinent Vitals/Pain Pain Assessment: 0-10 Pain Score: 2  Pain Location: R LQ Pain Descriptors / Indicators: Sore    Home Living                      Prior Function            PT Goals (current goals can now be found in the care plan section) Acute Rehab PT Goals Patient Stated Goal: home PT Goal Formulation: With patient Time For Goal Achievement: 09/30/18 Potential to  Achieve Goals: Good    Frequency    Min 3X/week      PT Plan      Co-evaluation              AM-PAC PT "6 Clicks" Mobility   Outcome Measure  Help needed turning from your back to your side while in a flat bed without using bedrails?: None Help needed moving from lying on your back to sitting on the side of a flat bed without using bedrails?: None Help needed moving to and from a bed to a chair (including a wheelchair)?: A Little Help needed standing up from a chair using your arms (e.g., wheelchair or bedside chair)?: A Little Help needed to walk in hospital room?: A Little Help  needed climbing 3-5 steps with a railing? : A Little 6 Click Score: 20    End of Session Equipment Utilized During Treatment: Gait belt Activity Tolerance: Patient tolerated treatment well Patient left: in chair;with call bell/phone within reach Nurse Communication: Mobility status PT Visit Diagnosis: Unsteadiness on feet (R26.81);Difficulty in walking, not elsewhere classified (R26.2)     Time: 8250-5397 PT Time Calculation (min) (ACUTE ONLY): 16 min  Charges:  $Gait Training: 8-22 mins                    Reinaldo Berber, PT, DPT Acute Rehabilitation Services Pager: [phone] Office: [phone]     Reinaldo Berber 09/17/2018, 9:38 AM

## 2018-09-17 NOTE — Progress Notes (Signed)
DISCHARGE NOTE HOME Samantha Conrad to be discharged Home per MD order. Discussed prescriptions and follow up appointments with the patient. Prescriptions given to patient; medication list explained in detail. Patient verbalized understanding.  Skin clean, dry and intact without evidence of skin break down, no evidence of skin tears noted. IV catheter discontinued intact. Site without signs and symptoms of complications. Dressing and pressure applied. Pt denies pain at the site currently. No complaints noted.  Patient free of lines, drains, and wounds.   An After Visit Summary (AVS) was printed and given to the patient. Patient escorted via wheelchair, and discharged home via private auto.  Dorthey Sawyer, RN

## 2018-09-18 LAB — CULTURE, BLOOD (ROUTINE X 2)
Culture: NO GROWTH
Culture: NO GROWTH
Special Requests: ADEQUATE
Special Requests: ADEQUATE

## 2018-09-18 NOTE — TOC Transition Note (Addendum)
Transition of Care [facility]) - CM/SW Discharge Note   Patient Details  Name: Samantha Conrad MRN: [id number] Date of Birth: 1941/02/06  Transition of Care [facility]) CM/SW Contact:  Bartholomew Crews, RN Phone Number: 09/18/2018, 8:39 AM   Clinical Narrative:    Patient transitioned home yesterday evening. Received phone call this morning from [facility] that faxes of orders and notes not received. Refaxed to [phone]. Patient to received [facility] RN and PT services, noted OT eval that no f/u recommended.   Spoke with Marzetta Board at [place] - faxes received. No further transition of care needs identified.   Final next level of care: Home w Home Health Services Barriers to Discharge: No Barriers Identified   Patient Goals and CMS Choice   CMS Medicare.gov Compare Post Acute Care list provided to:: Patient Choice offered to / list presented to : Patient  Discharge Placement               Home        Discharge Plan and Services In-house Referral: NA Discharge Planning Services: CM Consult Post Acute Care Choice: Home Health          DME Arranged: N/A DME Agency: NA HH Arranged: RN, PT [place] Agency: [facility]   Social Determinants of Health (SDOH) Interventions     Readmission Risk Interventions No flowsheet data found.

## 2018-09-22 ENCOUNTER — Emergency Department (HOSPITAL_COMMUNITY)
Admission: EM | Admit: 2018-09-22 | Discharge: 2018-09-22 | Disposition: A | Discharge status: Home / Self Care | Payer: Medicare Other | Attending: Emergency Medicine | Admitting: Emergency Medicine

## 2018-09-22 ENCOUNTER — Emergency Department (HOSPITAL_COMMUNITY): Payer: Medicare Other

## 2018-09-22 ENCOUNTER — Encounter (HOSPITAL_COMMUNITY): Payer: Self-pay | Admitting: Emergency Medicine

## 2018-09-22 DIAGNOSIS — I120 Hypertensive chronic kidney disease with stage 5 chronic kidney disease or end stage renal disease: Secondary | ICD-10-CM | POA: Diagnosis not present

## 2018-09-22 DIAGNOSIS — N186 End stage renal disease: Secondary | ICD-10-CM | POA: Insufficient documentation

## 2018-09-22 DIAGNOSIS — E1122 Type 2 diabetes mellitus with diabetic chronic kidney disease: Secondary | ICD-10-CM | POA: Insufficient documentation

## 2018-09-22 DIAGNOSIS — I2510 Atherosclerotic heart disease of native coronary artery without angina pectoris: Secondary | ICD-10-CM | POA: Insufficient documentation

## 2018-09-22 DIAGNOSIS — Z992 Dependence on renal dialysis: Secondary | ICD-10-CM | POA: Insufficient documentation

## 2018-09-22 DIAGNOSIS — R110 Nausea: Secondary | ICD-10-CM | POA: Diagnosis not present

## 2018-09-22 DIAGNOSIS — R1032 Left lower quadrant pain: Secondary | ICD-10-CM | POA: Diagnosis present

## 2018-09-22 DIAGNOSIS — Z7984 Long term (current) use of oral hypoglycemic drugs: Secondary | ICD-10-CM | POA: Insufficient documentation

## 2018-09-22 DIAGNOSIS — Z79899 Other long term (current) drug therapy: Secondary | ICD-10-CM | POA: Diagnosis not present

## 2018-09-22 DIAGNOSIS — E039 Hypothyroidism, unspecified: Secondary | ICD-10-CM | POA: Diagnosis not present

## 2018-09-22 DIAGNOSIS — Z85828 Personal history of other malignant neoplasm of skin: Secondary | ICD-10-CM | POA: Insufficient documentation

## 2018-09-22 DIAGNOSIS — K5792 Diverticulitis of intestine, part unspecified, without perforation or abscess without bleeding: Secondary | ICD-10-CM | POA: Diagnosis not present

## 2018-09-22 LAB — LIPASE, BLOOD: Lipase: 13 U/L (ref 11–51)

## 2018-09-22 LAB — COMPREHENSIVE METABOLIC PANEL
ALT: 30 U/L (ref 0–44)
AST: 32 U/L (ref 15–41)
Albumin: 1.9 g/dL — ABNORMAL LOW (ref 3.5–5.0)
Alkaline Phosphatase: 130 U/L — ABNORMAL HIGH (ref 38–126)
Anion gap: 12 (ref 5–15)
BUN: 44 mg/dL — ABNORMAL HIGH (ref 8–23)
CO2: 26 mmol/L (ref 22–32)
Calcium: 8.1 mg/dL — ABNORMAL LOW (ref 8.9–10.3)
Chloride: 97 mmol/L — ABNORMAL LOW (ref 98–111)
Creatinine, Ser: 5.13 mg/dL — ABNORMAL HIGH (ref 0.44–1.00)
GFR calc Af Amer: 9 mL/min — ABNORMAL LOW (ref >60)
GFR calc non Af Amer: 7 mL/min — ABNORMAL LOW (ref >60)
Glucose, Bld: 123 mg/dL — ABNORMAL HIGH (ref 70–99)
Potassium: 3.5 mmol/L (ref 3.5–5.1)
Sodium: 135 mmol/L (ref 135–145)
Total Bilirubin: 0.5 mg/dL (ref 0.3–1.2)
Total Protein: 5.2 g/dL — ABNORMAL LOW (ref 6.5–8.1)

## 2018-09-22 LAB — CBC WITH DIFFERENTIAL/PLATELET
Abs Immature Granulocytes: 0.07 10*3/uL (ref 0.00–0.07)
Basophils Absolute: 0 10*3/uL (ref 0.0–0.1)
Basophils Relative: 0 %
Eosinophils Absolute: 0.1 10*3/uL (ref 0.0–0.5)
Eosinophils Relative: 1 %
HCT: 28.8 % — ABNORMAL LOW (ref 36.0–46.0)
Hemoglobin: 8.8 g/dL — ABNORMAL LOW (ref 12.0–15.0)
Immature Granulocytes: 1 %
Lymphocytes Relative: 9 %
Lymphs Abs: 0.9 10*3/uL (ref 0.7–4.0)
MCH: 27.8 pg (ref 26.0–34.0)
MCHC: 30.6 g/dL (ref 30.0–36.0)
MCV: 91.1 fL (ref 80.0–100.0)
Monocytes Absolute: 0.7 10*3/uL (ref 0.1–1.0)
Monocytes Relative: 7 %
Neutro Abs: 8.1 10*3/uL — ABNORMAL HIGH (ref 1.7–7.7)
Neutrophils Relative %: 82 %
Platelets: 118 10*3/uL — ABNORMAL LOW (ref 150–400)
RBC: 3.16 MIL/uL — ABNORMAL LOW (ref 3.87–5.11)
RDW: 15.1 % (ref 11.5–15.5)
WBC: 9.9 10*3/uL (ref 4.0–10.5)
nRBC: 0 % (ref 0.0–0.2)

## 2018-09-22 MED ORDER — MORPHINE SULFATE (PF) 4 MG/ML IV SOLN
4.0000 mg | Freq: Once | INTRAVENOUS | Status: AC
  Administered 2018-09-22: 13:00:00 4 mg via INTRAVENOUS
  Filled 2018-09-22: qty 1

## 2018-09-22 MED ORDER — CIPROFLOXACIN HCL 500 MG PO TABS: 500.0000 mg | ORAL_TABLET | Freq: Two times a day (BID) | ORAL | 0 refills | Status: AC

## 2018-09-22 MED ORDER — METRONIDAZOLE 500 MG PO TABS
500.0000 mg | ORAL_TABLET | Freq: Once | ORAL | Status: AC
  Administered 2018-09-22: 500 mg via ORAL
  Filled 2018-09-22: qty 1

## 2018-09-22 MED ORDER — ONDANSETRON 8 MG PO TBDP: 8.0000 mg | ORAL_TABLET | Freq: Three times a day (TID) | ORAL | 0 refills | Status: DC | PRN

## 2018-09-22 MED ORDER — METRONIDAZOLE 500 MG PO TABS: 500.0000 mg | ORAL_TABLET | Freq: Two times a day (BID) | ORAL | 0 refills | Status: AC

## 2018-09-22 MED ORDER — ONDANSETRON HCL 4 MG/2ML IJ SOLN
4.0000 mg | Freq: Once | INTRAMUSCULAR | Status: AC
  Administered 2018-09-22: 13:00:00 4 mg via INTRAVENOUS
  Filled 2018-09-22: qty 2

## 2018-09-22 MED ORDER — CIPROFLOXACIN HCL 500 MG PO TABS
500.0000 mg | ORAL_TABLET | Freq: Once | ORAL | Status: AC
  Administered 2018-09-22: 500 mg via ORAL
  Filled 2018-09-22: qty 1

## 2018-09-22 NOTE — ED Triage Notes (Signed)
Pt here from home with c/o abd pain , pt was d/c from hospital with  Peritonitis pt is back today for pain control states that her stomach started hurting again last night , pt is currently on antibiotics

## 2018-09-22 NOTE — ED Provider Notes (Signed)
[place] EMERGENCY DEPARTMENT Provider Note   CSN: [id number] Arrival date & time: 09/22/18  1211    History   Chief Complaint Chief Complaint  Patient presents with   Abdominal Pain    HPI Samantha Conrad is a 78 y.o. female.     HPI Patient is a 78 year old female presents the emergency department complaints of abdominal discomfort.  She has a history of peritoneal dialysis and was recently diagnosed with spontaneous bacterial peritonitis.  She is on intraperitoneal vancomycin at this time.  She reports her abdominal pain worsened over the past 24 hours.  Reports nausea without vomiting.  No diarrhea.  Chills without documented fever.  No cough or congestion.  No shortness of breath.  Reports abdominal discomfort in the left lower quadrant.  Symptoms are moderate in severity and worse with palpation and movement.     Past Medical History:  Diagnosis Date   Anemia    Basal cell carcinoma     on nose/face   CAD (coronary artery disease)    a. 08/2018: posterior STEMI s/p DES to LCx x2   Depression    ESRD on peritoneal dialysis ([place])    "q night" (11/29/2017)   Fatty liver    Hypertension    Hypothyroidism    NASH (nonalcoholic steatohepatitis)    Seizures ([place])    ? when she may have had a stroke sometime in the spring of 2018   Sleep apnea    diagnosed years ago "after I'd gained alot of weight;  no longer a problem since weight loss"   Stroke ([place]) 04/2016   memory issues, lost 1/2 vision in both eyes   Thrombocytopenia ([place])    Thyroid disease    Type II diabetes mellitus [facility])     Patient Active Problem List   Diagnosis Date Noted   Peritonitis ([place])    Spontaneous bacterial peritonitis ([place]) 09/13/2018   CAD (coronary artery disease)    Anemia    Thrombocytopenia (H[facility])    Acute ST elevation myocardial infarction (STEMI) of posterior wall ([place]) 09/06/2018   Symptomatic anemia 03/11/2017   Diet-controlled  diabetes mellitus ([place]) 03/11/2017   NASH (nonalcoholic steatohepatitis) 03/11/2017   Hypertension 03/11/2017   Hypothyroidism 03/11/2017   ESRD on dialysis ([place]) 03/11/2017   Seizure disorder 03/11/2017   Abdominal pain 03/11/2017   HLD (hyperlipidemia) 03/11/2017   Chronic pancreatitis ([place]) 03/11/2017   Constipation 03/11/2017   Laryngopharyngeal reflux (LPR) 02/17/2015   H/O Mild allergic rhinitis 02/17/2015   Cirrhosis ([place]) 02/17/2015    Past Surgical History:  Procedure Laterality Date   ABDOMINAL HYSTERECTOMY     AV FISTULA PLACEMENT Right 10/30/2016   Procedure: INSERTION OF ARTERIOVENOUS (AV) GORE-TEX GRAFT RIGHT UPPER ARM;  Surgeon: Elam Dutch, MD;  Location: [place];  Service: Vascular;  Laterality: Right;   CATARACT EXTRACTION W/ INTRAOCULAR LENS  IMPLANT, BILATERAL Bilateral    CHOLECYSTECTOMY OPEN     CORNEAL TRANSPLANT Bilateral    CORONARY ANGIOGRAPHY N/A 09/06/2018   Procedure: CORONARY ANGIOGRAPHY;  Surgeon: Lorretta Harp, MD;  Location: [place] CV LAB;  Service: Cardiovascular;  Laterality: N/A;   CORONARY/GRAFT ACUTE MI REVASCULARIZATION N/A 09/06/2018   Procedure: Coronary/Graft Acute MI Revascularization;  Surgeon: Lorretta Harp, MD;  Location: [place] [facility];  Service: Cardiovascular;  Laterality: N/A;   EYE SURGERY     KNEE ARTHROSCOPY Left    LEFT HEART CATH AND CORONARY ANGIOGRAPHY N/A 09/06/2018   Procedure: LEFT HEART CATH  AND CORONARY ANGIOGRAPHY;  Surgeon: Lorretta Harp, MD;  Location: [place] CV [facility];  Service: Cardiovascular;  Laterality: N/A;   MOHS SURGERY     "nose"   SHOULDER OPEN ROTATOR CUFF REPAIR Left    THROMBECTOMY AND REVISION OF ARTERIOVENTOUS (AV) GORETEX  GRAFT Right 12/04/2016   Procedure: THROMBECTOMY/ REVISION OF RIGHT UPPER ARM ARTERIOVENOUS GORETEX GRAFT;  Surgeon: Elam Dutch, MD;  Location: [place];  Service: Vascular;  Laterality: Right;   TONSILLECTOMY       OB History     No obstetric history on file.      Home Medications    Prior to Admission medications   Medication Sig Start Date End Date Taking? Authorizing Provider  acetaminophen-codeine (TYLENOL #3) 300-30 MG tablet Take 1-2 tablets by mouth every 6 (six) hours as needed for moderate pain.    [provider]  Amino Acids-Protein Hydrolys (FEEDING SUPPLEMENT, PRO-STAT SUGAR FREE 64,) LIQD Take 30 mLs by mouth 2 (two) times daily. 03/16/17   Doreatha Lew, MD  aspirin 81 MG chewable tablet Chew 1 tablet (81 mg total) by mouth daily. Patient not taking: Reported on 09/13/2018 09/08/18   Eileen Stanford, PA-C  atorvastatin (LIPITOR) 40 MG tablet Take 1 tablet (40 mg total) by mouth daily at 6 PM. Patient taking differently: Take 40 mg by mouth daily with supper.  09/08/18   Eileen Stanford, PA-C  carvedilol (COREG) 3.125 MG tablet Take 1 tablet (3.125 mg total) by mouth 2 (two) times daily with a meal. 09/08/18   Eileen Stanford, PA-C  ciprofloxacin (CIPRO) 500 MG tablet Take 1 tablet (500 mg total) by mouth 2 (two) times daily for 10 days. 09/22/18 10/02/18  Jola Schmidt, MD  citalopram (CELEXA) 10 MG tablet Take 10 mg by mouth daily with breakfast.  10/15/17   [provider]  clopidogrel (PLAVIX) 75 MG tablet Take 1 tablet (75 mg total) by mouth daily. Patient taking differently: Take 75 mg by mouth daily with breakfast.  09/08/18   Eileen Stanford, PA-C  docusate sodium (COLACE) 100 MG capsule Take 100 mg by mouth daily with lunch.    [provider]  furosemide (LASIX) 40 MG tablet Take 40 mg by mouth 2 (two) times daily with breakfast and lunch.     [provider]  glipiZIDE (GLUCOTROL) 5 MG tablet Take 5 mg by mouth daily before breakfast.  06/25/18   [provider]  levETIRAcetam (KEPPRA) 250 MG tablet Take 250 mg by mouth 2 (two) times daily with a meal.     [provider]  levothyroxine (SYNTHROID, LEVOTHROID) 100 MCG tablet  Take 100 mcg by mouth daily before breakfast.  09/02/16   [provider]  linagliptin (TRADJENTA) 5 MG TABS tablet Take 5 mg by mouth daily with breakfast.     [provider]  lipase/protease/amylase (CREON) 12000 units CPEP capsule Take 48,000 Units by mouth 3 (three) times daily with meals.    [provider]  metroNIDAZOLE (FLAGYL) 500 MG tablet Take 1 tablet (500 mg total) by mouth 2 (two) times daily for 10 days. 09/22/18 10/02/18  Jola Schmidt, MD  midodrine (PROAMATINE) 5 MG tablet Take 5 mg by mouth daily as needed (SBP <110).  07/30/18   [provider]  multivitamin (PROSIGHT) TABS tablet Take 1 tablet by mouth daily. Patient taking differently: Take 1 tablet by mouth daily with breakfast.  03/17/17   Patrecia Pour, Christean Grief, MD  nitroGLYCERIN (NITROSTAT) 0.4  MG SL tablet Place 0.4 mg under the tongue every 5 (five) minutes as needed for chest pain.    [provider]  Nutritional Supplements (FEEDING SUPPLEMENT, NEPRO CARB STEADY,) LIQD Take 237 mLs by mouth daily. Patient taking differently: Take 237 mLs by mouth daily with lunch.  03/17/17   Patrecia Pour, Christean Grief, MD  ondansetron (ZOFRAN ODT) 8 MG disintegrating tablet Take 1 tablet (8 mg total) by mouth every 8 (eight) hours as needed for nausea or vomiting. 09/22/18   Jola Schmidt, MD  pantoprazole (PROTONIX) 40 MG tablet Take 40 mg by mouth daily with breakfast.  08/21/18   [provider]  polyethylene glycol (MIRALAX / GLYCOLAX) packet Take 17 g by mouth daily.     [provider]  potassium chloride SA (K-DUR,KLOR-CON) 20 MEQ tablet Take 1 tablet (20 mEq total) by mouth 2 (two) times daily. Patient taking differently: Take 20 mEq by mouth daily with breakfast.  11/30/17   Meccariello, Bernita Raisin, DO  prednisoLONE acetate (PRED FORTE) 1 % ophthalmic suspension Place 1 drop into both eyes at bedtime.     [provider]  sevelamer carbonate (RENVELA) 800 MG tablet Take 2,400 mg  by mouth 3 (three) times daily with meals.  07/04/18   [provider]  traZODone (DESYREL) 50 MG tablet Take 50 mg by mouth at bedtime.    [provider]    Family History History reviewed. No pertinent family history.  Social History Social History   Tobacco Use   Smoking status: Never Smoker   Smokeless tobacco: Former Systems developer    Types: Snuff   Tobacco comment: "no snuff since < 1980s"  Substance Use Topics   Alcohol use: Never    Frequency: Never   Drug use: Never     Allergies   Contrast media [iodinated diagnostic agents] and Tape   Review of Systems Review of Systems  All other systems reviewed and are negative.    Physical Exam Updated Vital Signs BP (!) 117/57    Pulse 69    Temp 98.1 F (36.7 C)    Resp 16    SpO2 97%   Physical Exam Vitals signs and nursing note reviewed.  Constitutional:      General: She is not in acute distress.    Appearance: She is well-developed.  HENT:     Head: Normocephalic and atraumatic.  Neck:     Musculoskeletal: Normal range of motion.  Cardiovascular:     Rate and Rhythm: Normal rate and regular rhythm.     Heart sounds: Normal heart sounds.  Pulmonary:     Effort: Pulmonary effort is normal.     Breath sounds: Normal breath sounds.  Abdominal:     General: There is no distension.     Palpations: Abdomen is soft.     Tenderness: There is abdominal tenderness in the left lower quadrant. There is no guarding or rebound.  Musculoskeletal: Normal range of motion.  Skin:    General: Skin is warm and dry.  Neurological:     Mental Status: She is alert and oriented to person, place, and time.  Psychiatric:        Judgment: Judgment normal.      ED Treatments / Results  Labs (all labs ordered are listed, but only abnormal results are displayed) Labs Reviewed  CBC WITH DIFFERENTIAL/PLATELET - Abnormal; Notable for the following components:      Result Value   RBC 3.16 (*)    Hemoglobin  8.8  (*)    HCT 28.8 (*)    Platelets 118 (*)    Neutro Abs 8.1 (*)    All other components within normal limits  COMPREHENSIVE METABOLIC PANEL - Abnormal; Notable for the following components:   Chloride 97 (*)    Glucose, Bld 123 (*)    BUN 44 (*)    Creatinine, Ser 5.13 (*)    Calcium 8.1 (*)    Total Protein 5.2 (*)    Albumin 1.9 (*)    Alkaline Phosphatase 130 (*)    GFR calc non Af Amer 7 (*)    GFR calc Af Amer 9 (*)    All other components within normal limits  LIPASE, BLOOD    EKG None  Radiology Ct Abdomen Pelvis Wo Contrast  Result Date: 09/22/2018 CLINICAL DATA:  78 year old acute onset of RIGHT LOWER QUADRANT abdominal pain and nausea that began at 1 o'clock this morning. End stage renal disease on peritoneal dialysis. Surgical history includes cholecystectomy and abdominal hysterectomy. EXAM: CT ABDOMEN AND PELVIS WITHOUT CONTRAST TECHNIQUE: Multidetector CT imaging of the abdomen and pelvis was performed following the standard protocol without IV contrast. COMPARISON:  09/13/2018 and earlier. FINDINGS: Lower chest: Small RIGHT pleural effusion and associated minimal passive atelectasis in the RIGHT LOWER LOBE, unchanged since the CT 9 days ago. Interval near resolution of the LEFT pleural effusion present at that time. Minimal scarring in the RIGHT MIDDLE LOBE and lingula, unchanged. Lung bases otherwise clear. Heart size normal. Small pericardial effusion which has increased in size since the CT 9 days ago. Hepatobiliary: Normal unenhanced appearance of the liver. Surgically absent gallbladder. No unexpected biliary ductal dilation. Pancreas: Calcifications throughout the mildly atrophic pancreas, with dense calcifications in the head of the pancreas. No visible mass or peripancreatic edema/inflammation. Spleen: Normal unenhanced appearance. Adrenals/Urinary Tract: Normal appearing adrenal glands. Mild diffuse cortical thinning involving both kidneys. Benign cortical cyst  arising from the UPPER pole the RIGHT kidney. Otherwise, normal unenhanced appearance of the kidneys. No hydronephrosis. No urinary tract calculi. Normal appearing urinary bladder. Stomach/Bowel: Stomach normal in appearance for the degree of distention. Normal-appearing small bowel. Diverticulosis involving the cecum, ascending colon and sigmoid colon. Pericolonic edema/inflammation adjacent to the cecum and ascending colon in the RIGHT UPPER QUADRANT. No extraluminal gas or abnormal fluid collection. Normal appendix in the RIGHT UPPER pelvis immediately ANTERIOR to the psoas muscle. Vascular/Lymphatic: Moderate aorto-iliofemoral atherosclerosis without evidence of aneurysm. No pathologic lymphadenopathy. Reproductive: Surgically absent uterus. No adnexal masses. Other: Peritoneal dialysis catheter in the pelvis with scattered air bubbles throughout the peritoneum. Musculoskeletal: Osseous demineralization. Facet degenerative changes involving the LOWER lumbar spine resulting in degenerative grade 1 spondylolisthesis of L4 on L5 approximating 4 mm. No acute findings. IMPRESSION: 1. Acute diverticulitis involving the cecum and ascending colon. No evidence of perforation or abscess. 2. Small pericardial effusion which has increased in size since the CT 9 days ago. 3. Peritoneal dialysis catheter in the pelvis with scattered air bubbles throughout the peritoneum. 4. Small right pleural effusion and associated minimal passive atelectasis in the right lower lobe, unchanged since the CT 9 days ago. A LEFT pleural effusion present at that time has essentially resolved in the interval. 5. Chronic calcific pancreatitis. No evidence of acute pancreatitis. Aortic Atherosclerosis (ICD10-170.0) Electronically Signed   By: Evangeline Dakin M.D.   On: 09/22/2018 13:53    Procedures Procedures (including critical care time)  Medications Ordered in ED Medications  morphine 4 MG/ML injection 4 mg (4  mg Intravenous Given  09/22/18 1251)  ondansetron (ZOFRAN) injection 4 mg (4 mg Intravenous Given 09/22/18 1251)  ciprofloxacin (CIPRO) tablet 500 mg (500 mg Oral Given 09/22/18 1456)  metroNIDAZOLE (FLAGYL) tablet 500 mg (500 mg Oral Given 09/22/18 1456)     Initial Impression / Assessment and Plan / ED Course  I have reviewed the triage vital signs and the nursing notes.  Pertinent labs & imaging results that were available during my care of the patient were reviewed by me and considered in my medical decision making (see chart for details).        Acute diverticulitis on CT imaging without complicating factors.  Will place the patient on ciprofloxacin and Flagyl.  No indication for additional work-up or treatment at this time.  Home with antibiotics.  Close primary care follow-up.  Patient understands return to the ER for new or worsening symptoms.  Final Clinical Impressions(s) / ED Diagnoses   Final diagnoses:  Acute diverticulitis    ED Discharge Orders         Ordered    ciprofloxacin (CIPRO) 500 MG tablet  2 times daily     09/22/18 1610    metroNIDAZOLE (FLAGYL) 500 MG tablet  2 times daily     09/22/18 1610    ondansetron (ZOFRAN ODT) 8 MG disintegrating tablet  Every 8 hours PRN     09/22/18 1610           Jola Schmidt, MD 09/26/18 [phone]

## 2019-02-26 ENCOUNTER — Other Ambulatory Visit: Payer: Self-pay | Admitting: Physician Assistant

## 2019-03-05 ENCOUNTER — Other Ambulatory Visit: Payer: Self-pay | Admitting: Physician Assistant

## 2019-03-05 MED ORDER — ATORVASTATIN CALCIUM 40 MG PO TABS: 40.0000 mg | ORAL_TABLET | Freq: Every day | ORAL | 0 refills | Status: DC

## 2019-03-05 NOTE — Telephone Encounter (Signed)
Patients son [name] called for refills on medications. It looks like Carvedilol was denied on 02/26/19. Ok to fill?

## 2019-03-19 ENCOUNTER — Other Ambulatory Visit: Payer: Self-pay

## 2019-03-20 MED ORDER — ATORVASTATIN CALCIUM 40 MG PO TABS: 40.0000 mg | ORAL_TABLET | Freq: Every day | ORAL | 0 refills | Status: DC

## 2019-03-20 MED ORDER — CARVEDILOL 3.125 MG PO TABS: 3.1250 mg | ORAL_TABLET | Freq: Two times a day (BID) | ORAL | 0 refills | Status: DC

## 2019-03-25 ENCOUNTER — Other Ambulatory Visit: Payer: Self-pay | Admitting: Cardiovascular Disease

## 2019-03-25 NOTE — Telephone Encounter (Signed)
OK to refill these medications. Has OV in Dec but has not been seen in office yet

## 2019-03-25 NOTE — Telephone Encounter (Signed)
New message    *STAT* If patient is at the pharmacy, call can be transferred to refill team.   1. Which medications need to be refilled? (please list name of each medication and dose if known) clopidogrel (PLAVIX) 75 MG tablet  carvedilol (COREG) 3.125 MG tablet  atorvastatin (LIPITOR) 40 MG tablet  2. Which pharmacy/location (including street and city if local pharmacy) is medication to be sent to? [id number] [place], [place] - [id number]  3. Do they need a 30 day or 90 day supply? 90 day

## 2019-03-26 MED ORDER — ATORVASTATIN CALCIUM 40 MG PO TABS: 40.0000 mg | ORAL_TABLET | Freq: Every day | ORAL | 0 refills | Status: DC

## 2019-03-26 MED ORDER — CARVEDILOL 3.125 MG PO TABS: 3.1250 mg | ORAL_TABLET | Freq: Two times a day (BID) | ORAL | 0 refills | Status: DC

## 2019-03-26 MED ORDER — CLOPIDOGREL BISULFATE 75 MG PO TABS: 75.0000 mg | ORAL_TABLET | Freq: Every day | ORAL | 0 refills | Status: DC

## 2019-03-26 NOTE — Telephone Encounter (Signed)
Okay to renew medications

## 2019-03-26 NOTE — Telephone Encounter (Signed)
Rx(s) sent to pharmacy electronically.  

## 2019-03-30 ENCOUNTER — Other Ambulatory Visit: Payer: Self-pay | Admitting: Physician Assistant

## 2019-04-02 ENCOUNTER — Other Ambulatory Visit: Payer: Self-pay | Admitting: Cardiovascular Disease

## 2019-04-09 ENCOUNTER — Telehealth: Payer: Medicare Other | Admitting: Cardiovascular Disease

## 2019-04-30 ENCOUNTER — Encounter: Payer: Self-pay | Admitting: Cardiovascular Disease

## 2019-04-30 ENCOUNTER — Telehealth (INDEPENDENT_AMBULATORY_CARE_PROVIDER_SITE_OTHER): Payer: Medicare Other | Admitting: Cardiovascular Disease

## 2019-04-30 VITALS — BP 155/73 | HR 65 | Temp 98.0°F | Ht 61.5 in | Wt 128.0 lb

## 2019-04-30 DIAGNOSIS — I2129 ST elevation (STEMI) myocardial infarction involving other sites: Secondary | ICD-10-CM

## 2019-04-30 DIAGNOSIS — I10 Essential (primary) hypertension: Secondary | ICD-10-CM

## 2019-04-30 DIAGNOSIS — N186 End stage renal disease: Secondary | ICD-10-CM

## 2019-04-30 DIAGNOSIS — Z992 Dependence on renal dialysis: Secondary | ICD-10-CM

## 2019-04-30 DIAGNOSIS — I2510 Atherosclerotic heart disease of native coronary artery without angina pectoris: Secondary | ICD-10-CM | POA: Diagnosis not present

## 2019-04-30 DIAGNOSIS — E118 Type 2 diabetes mellitus with unspecified complications: Secondary | ICD-10-CM | POA: Diagnosis not present

## 2019-04-30 DIAGNOSIS — E039 Hypothyroidism, unspecified: Secondary | ICD-10-CM

## 2019-04-30 MED ORDER — AMLODIPINE BESYLATE 2.5 MG PO TABS: 2.5000 mg | ORAL_TABLET | Freq: Every day | ORAL | 3 refills | Status: DC

## 2019-04-30 NOTE — Patient Instructions (Addendum)
Medication Instructions:  Start Amlodipine 2.5 mg daily Continue all other medications *If you need a refill on your cardiac medications before your next appointment, please call your pharmacy*  Lab Work: None ordered   Testing/Procedures: None ordered  Follow-Up: At [facility], you and your health needs are our priority.  As part of our continuing mission to provide you with exceptional heart care, we have created designated Provider Care Teams.  These Care Teams include your primary Cardiologist (physician) and Advanced Practice Providers (APPs -  Physician Assistants and Nurse Practitioners) who all work together to provide you with the care you need, when you need it.  Your next appointment:  Thursday 07/31/19 at 1:20 pm   The format for your next appointment:  Office   Provider:  [facility]

## 2019-04-30 NOTE — Progress Notes (Signed)
Virtual Visit via Telephone Note   This visit type was conducted due to national recommendations for restrictions regarding the COVID-19 Pandemic (e.g. social distancing) in an effort to limit this patient's exposure and mitigate transmission in our community.  Due to her co-morbid illnesses, this patient is at least at moderate risk for complications without adequate follow up.  This format is felt to be most appropriate for this patient at this time.  The patient did not have access to video technology/had technical difficulties with video requiring transitioning to audio format only (telephone).  All issues noted in this document were discussed and addressed.  No physical exam could be performed with this format.  Please refer to the patient's chart for her  consent to telehealth for [facility].   Date:  04/30/2019   ID:  Samantha Conrad, DOB 1940/09/29, MRN [id number]  Patient Location: Home Provider Location: Office  PCP:  [facility]  Cardiologist:  Quay Burow, MD  Electrophysiologist:  None   Evaluation Performed:  Follow-Up Visit  Chief Complaint: Initial visit following hospitalization  History of Present Illness:    Samantha Conrad is a 78 y.o. female with  end-stage renal disease on peritoneal dialysis, diabetes mellitus, prior stroke, thrombocytopenia who developed new onset of severe chest pain around dinnertime on September 05, 2018.  She presented to the emergency room late in the evening and STEMI was activated.  She was taken emergently to the catheterization laboratory by Dr. Gwenlyn Found.  She was found to have thrombotic occlusion of the proximal circumflex as well as the continuation branch of the AV groove.  She had a normal LAD and large normal dominant RCA.  She underwent stenting of the proximal circumflex with an excellent result but had a suboptimal result at the AV groove circumflex with residual narrowing of 50%.  The patient ultimately did well.   She lives in [place].  She lives by herself but her daughter lives to houses away.  She is on dialysis and recently was back on hemodialysis.  She was in training session today to resume home peritoneal dialysis.  It does not appear that she has seen Dr. Gwenlyn Found in follow-up of her hospitalization.  She denies recurrent chest pain or shortness of breath.  Her blood pressure has been at times elevated.  She is now evaluated in a telemedicine visit.  The patient does not have symptoms concerning for COVID-19 infection (fever, chills, cough, or new shortness of breath).    Past Medical History:  Diagnosis Date  . Anemia   . Basal cell carcinoma     on nose/face  . CAD (coronary artery disease)    a. 08/2018: posterior STEMI s/p DES to LCx x2  . Depression   . ESRD on peritoneal dialysis ([place])    "q night" (11/29/2017)  . Fatty liver   . Hypertension   . Hypothyroidism   . NASH (nonalcoholic steatohepatitis)   . Seizures ([place])    ? when she may have had a stroke sometime in the spring of 2018  . Sleep apnea    diagnosed years ago "after I'd gained alot of weight;  no longer a problem since weight loss"  . Stroke [facility]) 04/2016   memory issues, lost 1/2 vision in both eyes  . Thrombocytopenia ([place])   . Thyroid disease   . Type II diabetes mellitus ([place])    Past Surgical History:  Procedure Laterality Date  . ABDOMINAL HYSTERECTOMY    .  AV FISTULA PLACEMENT Right 10/30/2016   Procedure: INSERTION OF ARTERIOVENOUS (AV) GORE-TEX GRAFT RIGHT UPPER ARM;  Surgeon: Elam Dutch, MD;  Location: [facility];  Service: Vascular;  Laterality: Right;  . CATARACT EXTRACTION W/ INTRAOCULAR LENS  IMPLANT, BILATERAL Bilateral   . CHOLECYSTECTOMY OPEN    . CORNEAL TRANSPLANT Bilateral   . CORONARY ANGIOGRAPHY N/A 09/06/2018   Procedure: CORONARY ANGIOGRAPHY;  Surgeon: Lorretta Harp, MD;  Location: [facility];  Service: Cardiovascular;  Laterality: N/A;  . CORONARY/GRAFT ACUTE MI  REVASCULARIZATION N/A 09/06/2018   Procedure: Coronary/Graft Acute MI Revascularization;  Surgeon: Lorretta Harp, MD;  Location: [facility];  Service: Cardiovascular;  Laterality: N/A;  . EYE SURGERY    . KNEE ARTHROSCOPY Left   . LEFT HEART CATH AND CORONARY ANGIOGRAPHY N/A 09/06/2018   Procedure: LEFT HEART CATH AND CORONARY ANGIOGRAPHY;  Surgeon: Lorretta Harp, MD;  Location: [facility];  Service: Cardiovascular;  Laterality: N/A;  . MOHS SURGERY     "nose"  . SHOULDER OPEN ROTATOR CUFF REPAIR Left   . THROMBECTOMY AND REVISION OF ARTERIOVENTOUS (AV) GORETEX  GRAFT Right 12/04/2016   Procedure: THROMBECTOMY/ REVISION OF RIGHT UPPER ARM ARTERIOVENOUS GORETEX GRAFT;  Surgeon: Elam Dutch, MD;  Location: [facility];  Service: Vascular;  Laterality: Right;  . TONSILLECTOMY       Current Meds  Medication Sig  . acetaminophen-codeine (TYLENOL #3) 300-30 MG tablet Take 1-2 tablets by mouth every 6 (six) hours as needed for moderate pain.  . Amino Acids-Protein Hydrolys (FEEDING SUPPLEMENT, PRO-STAT SUGAR FREE 64,) LIQD Take 30 mLs by mouth 2 (two) times daily.  Marland Kitchen aspirin 81 MG chewable tablet Chew 1 tablet (81 mg total) by mouth daily.  . carvedilol (COREG) 3.125 MG tablet Take 1 tablet (3.125 mg total) by mouth 2 (two) times daily with a meal.  . citalopram (CELEXA) 10 MG tablet Take 10 mg by mouth daily with breakfast.   . clopidogrel (PLAVIX) 75 MG tablet Take 1 tablet (75 mg total) by mouth daily with breakfast.  . docusate sodium (COLACE) 100 MG capsule Take 100 mg by mouth daily with lunch.  . furosemide (LASIX) 40 MG tablet Take 40 mg by mouth 2 (two) times daily with breakfast and lunch.   Marland Kitchen glipiZIDE (GLUCOTROL) 5 MG tablet Take 5 mg by mouth daily before breakfast.   . levETIRAcetam (KEPPRA) 250 MG tablet Take 250 mg by mouth 2 (two) times daily with a meal.   . levothyroxine (SYNTHROID, LEVOTHROID) 100 MCG tablet Take 100 mcg by mouth daily before breakfast.   .  linagliptin (TRADJENTA) 5 MG TABS tablet Take 5 mg by mouth daily with breakfast.   . lipase/protease/amylase (CREON) 12000 units CPEP capsule Take 48,000 Units by mouth 3 (three) times daily with meals.  . midodrine (PROAMATINE) 5 MG tablet Take 5 mg by mouth daily as needed (SBP <110).   . multivitamin (PROSIGHT) TABS tablet Take 1 tablet by mouth daily. (Patient taking differently: Take 1 tablet by mouth daily with breakfast. )  . nitroGLYCERIN (NITROSTAT) 0.4 MG SL tablet Place 0.4 mg under the tongue every 5 (five) minutes as needed for chest pain.  . Nutritional Supplements (FEEDING SUPPLEMENT, NEPRO CARB STEADY,) LIQD Take 237 mLs by mouth daily. (Patient taking differently: Take 237 mLs by mouth daily with lunch. )  . ondansetron (ZOFRAN ODT) 8 MG disintegrating tablet Take 1 tablet (8 mg total) by mouth every 8 (eight) hours as needed  for nausea or vomiting.  . pantoprazole (PROTONIX) 40 MG tablet Take 40 mg by mouth daily with breakfast.   . polyethylene glycol (MIRALAX / GLYCOLAX) packet Take 17 g by mouth daily.   . potassium chloride SA (K-DUR,KLOR-CON) 20 MEQ tablet Take 1 tablet (20 mEq total) by mouth 2 (two) times daily. (Patient taking differently: Take 20 mEq by mouth daily with breakfast. )  . prednisoLONE acetate (PRED FORTE) 1 % ophthalmic suspension Place 1 drop into both eyes at bedtime.   . sevelamer carbonate (RENVELA) 800 MG tablet Take 2,400 mg by mouth 3 (three) times daily with meals.   . traZODone (DESYREL) 50 MG tablet Take 50 mg by mouth at bedtime.     Allergies:   Contrast media [iodinated diagnostic agents] and Tape   Social History   Tobacco Use  . Smoking status: Never Smoker  . Smokeless tobacco: Former Systems developer    Types: Snuff  . Tobacco comment: "no snuff since < 1980s"  Substance Use Topics  . Alcohol use: Never    Frequency: Never  . Drug use: Never     Family Hx: The patient's family history is not on file.  ROS:   Please see the history of  present illness.    She denies fevers chills night sweats She denies cough She denies change in taste or smell There is no wheezing She denies recurrent anginal type chest pain She is unaware of palpitations She had been on hemodialysis recently but will be transitioning back to peritoneal dialysis. Blood pressures have been labile All other systems reviewed and are negative.   Prior CV studies:   The following studies were reviewed today:  EMERGENT CATH/PCI   Ost Cx to Prox Cx lesion is 99% stenosed.  Prox Cx lesion is 100% stenosed.  A drug-eluting stent was successfully placed.  Post intervention, there is a 0% residual stenosis.  A stent was successfully placed.  Post intervention, there is a 50% residual stenosis.   IMPRESSION:Ms. Ellerson had a inferoposterior MI secondary to thrombotic occlusion of the proximal circumflex as well as the continuation of the AV groove. She had a normal LAD and a normal large dominant RCA. I was able to reestablish flow in the proximal circumflex and OM branch which was a fairly large vessel but got a suboptimal result in the AV groove continuation going into a small PLA branch. She was pain-free at the end of the case. It should be noted that the patient was chronically thrombocytopenic with a platelet count in the 50,000 range. I am going to continue Angiomax for 4 hours full dose. Troponins will be cycled. 2D echo was ordered. Careful attention to groin management during sheath removal because of her thrombocytopenia. The patient left lab in stable condition. She will need to be on DAPT for at least 12 months uninterrupted.     Intervention      ECHO 09/06/2018 IMPRESSIONS  1. The left ventricle has low normal systolic function, with an ejection fraction of 50-55%. The cavity size was normal. Left ventricular diastolic Doppler parameters are consistent with pseudonormalization. Elevated left ventricular end-diastolic   pressure.  2. Distal septal and posterior lateral hypokinesis.  3. The right ventricle has normal systolic function. The cavity was normal. There is no increase in right ventricular wall thickness.  4. Trivial pericardial effusion is present.  5. Mild thickening of the mitral valve leaflet. Mild calcification of the mitral valve leaflet. Mitral valve regurgitation is mild to moderate by  color flow Doppler.  6. The aortic valve is tricuspid. Moderate thickening of the aortic valve. Moderate calcification of the aortic valve. Aortic valve regurgitation is mild by color flow Doppler.  7. The aortic root is normal in size and structure.  8. The interatrial septum was not well visualized.    EKG:  An ECG dated 09/16/2018 was personally reviewed today and demonstrated:  Sinus rhythm at 77, low voltage, nonspecific T changes in lead III  Recent Labs: 09/05/2018: Magnesium 1.6 09/07/2018: TSH 3.439 09/22/2018: ALT 30; BUN 44; Creatinine, Ser 5.13; Hemoglobin 8.8; Platelets 118; Potassium 3.5; Sodium 135   Recent Lipid Panel Lab Results  Component Value Date/Time   CHOL 160 09/05/2018 11:48 PM   TRIG 159 (H) 09/05/2018 11:48 PM   HDL 74 09/05/2018 11:48 PM   CHOLHDL 2.2 09/05/2018 11:48 PM   LDLCALC 54 09/05/2018 11:48 PM    Wt Readings from Last 3 Encounters:  04/30/19 128 lb (58.1 kg)  09/17/18 140 lb 14 oz (63.9 kg)  09/08/18 136 lb (61.7 kg)     Objective:    Vital Signs:  BP (!) 155/73   Pulse 65   Temp 98 F (36.7 C)   Ht 5' 1.5" (1.562 m)   Wt 128 lb (58.1 kg)   BMI 23.79 kg/m    Since this was a virtual visit I could not perform a physical exam. 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  She underwent successful stenting of her proximal circumflex and stenting of the mid AV groove.  She denies recurrent anginal symptomatology.  She is on DAPT with aspirin/Plavix 2. End-stage renal disease: She had previously been on peritoneal dialysis and more recently on hemodialysis.  She is now being reeducated to resume peritoneal dialysis. 3. Hypertension: Blood pressure today is elevated at 155/73.  I am adding amlodipine 2.5 mg to her medical regimen.  She continues to be on carvedilol 3.125 mg twice a day. 4. Type 2 diabetes mellitus: On Tradjenta and glipizide 5. Hypothyroidism: Currently on levothyroxine 100 mcg 6. History of chronic pancreatitis: On lipase/protease/amylase with meals  COVID-19 Education: The signs and symptoms of COVID-19 were discussed with the patient and how to seek care for testing (follow up with PCP or arrange E-visit).  The importance of social distancing was discussed today.  Time:   Today, I have spent 20 minutes with the patient with telehealth technology discussing the above problems.     Medication Adjustments/Labs and Tests Ordered: Current medicines are reviewed at length with the patient today.  Concerns regarding medicines are outlined above.   Tests Ordered: No orders of the defined types were placed in this encounter.   Medication Changes: No orders of the defined types were placed in this encounter.   Follow Up: Follow-up for office evaluation in 3 months with either Dr. Gwenlyn Found or me  Signed, Shelva Majestic, MD  04/30/2019 12:32 PM    [place]

## 2019-05-04 ENCOUNTER — Encounter: Payer: Self-pay | Admitting: Cardiovascular Disease

## 2019-07-31 ENCOUNTER — Other Ambulatory Visit: Payer: Self-pay

## 2019-07-31 ENCOUNTER — Ambulatory Visit: Payer: Medicare Other | Admitting: Cardiovascular Disease

## 2019-07-31 VITALS — BP 148/72 | HR 70 | Ht 61.0 in | Wt 135.2 lb

## 2019-07-31 DIAGNOSIS — N186 End stage renal disease: Secondary | ICD-10-CM

## 2019-07-31 DIAGNOSIS — E118 Type 2 diabetes mellitus with unspecified complications: Secondary | ICD-10-CM | POA: Diagnosis not present

## 2019-07-31 DIAGNOSIS — R600 Localized edema: Secondary | ICD-10-CM | POA: Diagnosis not present

## 2019-07-31 DIAGNOSIS — I10 Essential (primary) hypertension: Secondary | ICD-10-CM

## 2019-07-31 DIAGNOSIS — D649 Anemia, unspecified: Secondary | ICD-10-CM

## 2019-07-31 DIAGNOSIS — I2129 ST elevation (STEMI) myocardial infarction involving other sites: Secondary | ICD-10-CM | POA: Diagnosis not present

## 2019-07-31 DIAGNOSIS — Z992 Dependence on renal dialysis: Secondary | ICD-10-CM

## 2019-07-31 DIAGNOSIS — E039 Hypothyroidism, unspecified: Secondary | ICD-10-CM

## 2019-07-31 MED ORDER — ATORVASTATIN CALCIUM 40 MG PO TABS: 40.0000 mg | ORAL_TABLET | Freq: Every day | ORAL | 0 refills | Status: DC

## 2019-07-31 MED ORDER — FUROSEMIDE 40 MG PO TABS: 80.0000 mg | ORAL_TABLET | Freq: Two times a day (BID) | ORAL | 3 refills | Status: DC

## 2019-07-31 MED ORDER — CLOPIDOGREL BISULFATE 75 MG PO TABS: 75.0000 mg | ORAL_TABLET | Freq: Every day | ORAL | 0 refills | Status: DC

## 2019-07-31 MED ORDER — CARVEDILOL 3.125 MG PO TABS: 3.1250 mg | ORAL_TABLET | Freq: Two times a day (BID) | ORAL | 3 refills | Status: DC

## 2019-07-31 MED ORDER — CARVEDILOL 3.125 MG PO TABS: 3.1250 mg | ORAL_TABLET | Freq: Two times a day (BID) | ORAL | 0 refills | Status: DC

## 2019-07-31 MED ORDER — AMLODIPINE BESYLATE 5 MG PO TABS: 5.0000 mg | ORAL_TABLET | Freq: Every day | ORAL | 3 refills | Status: DC

## 2019-07-31 NOTE — Progress Notes (Signed)
Cardiology Office Note    Date:  08/02/2019   ID:  Samantha Conrad, DOB 12/03/1940, MRN [id number]  PCP:  [facility]  Cardiologist:  Shelva Majestic, MD   48-monthfollow-up cardiology evaluation  History of Present Illness:  Samantha RITTHALERis a 79y.o. female with end-stage renal disease on peritoneal dialysis, diabetes mellitus, prior stroke, thrombocytopenia who developed new onset of severe chest pain around dinnertime on September 05, 2018. She presented to the emergency room late in the evening and STEMI was activated.She was taken emergently to the catheterization laboratory by Dr. BGwenlyn Found  She was found to have thrombotic occlusion of the proximal circumflex as well as the continuation branch of the AV groove.  She had a normal LAD and large normal dominant RCA.  She underwent stenting of the proximal circumflex with an excellent result but had a suboptimal result at the AV groove circumflex with residual narrowing of 50%.  The patient had never seen Dr. BGwenlyn Foundin follow-up of her hospitalization.  She ultimately was on my schedule in December 2020 and I saw her in a telemedicine evaluation. [place]  She is on dialysis and recently was back on hemodialysis.    When I evaluated her she was in a training session again to resume peritoneal dialysis.  At that evaluation she denied any episodes of recurrent chest pain or significant shortness of breath.  She did not note blood pressure lability.   She is now seen in the office setting with her daughter.  She is back on peritoneal dialysis which she does at night.  She has issues with chronic anemia and several weeks ago had required blood cell transfusion.  She states her dry rate is 125.  She recently had significant increase in fluid weight in her peritoneal dialysate had been increased to 4.25%.  She admits to leg swelling.  She denies any angina.  She is also  undergoing GI evaluation in [place].  She presents for an in office evaluation.   Past Medical History:  Diagnosis Date  . Anemia   . Basal cell carcinoma     on nose/face  . CAD (coronary artery disease)    a. 08/2018: posterior STEMI s/p DES to LCx x2  . Depression   . [place]    "q night" (11/29/2017)  . Fatty liver   . Hypertension   . Hypothyroidism   . NASH (nonalcoholic steatohepatitis)   . Seizures ([facility]    ? when she may have had a stroke sometime in the spring of 2018  . Sleep apnea    diagnosed years ago "after I'd gained alot of weight;  no longer a problem since weight loss"  . Stroke ([facility] 04/2016   memory issues, lost 1/2 vision in both eyes  . Thrombocytopenia ([facility]   . Thyroid disease   . Type II diabetes mellitus ([facility]     Past Surgical History:  Procedure Laterality Date  . ABDOMINAL HYSTERECTOMY    . AV FISTULA PLACEMENT Right 10/30/2016   Procedure: INSERTION OF ARTERIOVENOUS (AV) GORE-TEX GRAFT RIGHT UPPER ARM;  Surgeon: FElam Dutch MD;  [place] [facility]  Service: Vascular;  Laterality: Right;  . CATARACT EXTRACTION W/ INTRAOCULAR LENS  IMPLANT, BILATERAL Bilateral   . CHOLECYSTECTOMY OPEN    . CORNEAL TRANSPLANT Bilateral   . CORONARY ANGIOGRAPHY N/A 09/06/2018   Procedure: CORONARY ANGIOGRAPHY;  Surgeon: Lorretta Harp, MD;  Location: [place] [facility];  Service: Cardiovascular;  Laterality: N/A;  . CORONARY/GRAFT ACUTE MI REVASCULARIZATION N/A 09/06/2018   Procedure: Coronary/Graft Acute MI Revascularization;  Surgeon: Lorretta Harp, MD;  Location: [place] [facility];  Service: Cardiovascular;  Laterality: N/A;  . EYE SURGERY    . KNEE ARTHROSCOPY Left   . LEFT HEART CATH AND CORONARY ANGIOGRAPHY N/A 09/06/2018   Procedure: LEFT HEART CATH AND CORONARY ANGIOGRAPHY;  Surgeon: Lorretta Harp, MD;  Location: [place] [facility];  Service: Cardiovascular;  Laterality: N/A;  . MOHS SURGERY     "nose"  . SHOULDER  OPEN ROTATOR CUFF REPAIR Left   . THROMBECTOMY AND REVISION OF ARTERIOVENTOUS (AV) GORETEX  GRAFT Right 12/04/2016   Procedure: THROMBECTOMY/ REVISION OF RIGHT UPPER ARM ARTERIOVENOUS GORETEX GRAFT;  Surgeon: Elam Dutch, MD;  Location: [place];  Service: Vascular;  Laterality: Right;  . TONSILLECTOMY      Current Medications: Outpatient Medications Prior to Visit  Medication Sig Dispense Refill  . acetaminophen-codeine (TYLENOL #3) 300-30 MG tablet Take 1-2 tablets by mouth every 6 (six) hours as needed for moderate pain.    . Amino Acids-Protein Hydrolys (FEEDING SUPPLEMENT, PRO-STAT SUGAR FREE 64,) LIQD Take 30 mLs by mouth 2 (two) times daily. 900 mL 0  . aspirin 81 MG chewable tablet Chew 1 tablet (81 mg total) by mouth daily.    . citalopram (CELEXA) 10 MG tablet Take 10 mg by mouth daily with breakfast.   1  . docusate sodium (COLACE) 100 MG capsule Take 100 mg by mouth daily with lunch.    Marland Kitchen glipiZIDE (GLUCOTROL) 5 MG tablet Take 5 mg by mouth daily before breakfast.     . levETIRAcetam (KEPPRA) 250 MG tablet Take 250 mg by mouth 2 (two) times daily with a meal.     . levothyroxine (SYNTHROID, LEVOTHROID) 100 MCG tablet Take 100 mcg by mouth daily before breakfast.   0  . linagliptin (TRADJENTA) 5 MG TABS tablet Take 5 mg by mouth daily with breakfast.     . lipase/protease/amylase (CREON) 12000 units CPEP capsule Take 48,000 Units by mouth 3 (three) times daily with meals.    . midodrine (PROAMATINE) 5 MG tablet Take 5 mg by mouth daily as needed (SBP <110).     . multivitamin (PROSIGHT) TABS tablet Take 1 tablet by mouth daily. (Patient taking differently: Take 1 tablet by mouth daily with breakfast. ) 30 each 0  . nitroGLYCERIN (NITROSTAT) 0.4 MG SL tablet Place 0.4 mg under the tongue every 5 (five) minutes as needed for chest pain.    . Nutritional Supplements (FEEDING SUPPLEMENT, NEPRO CARB STEADY,) LIQD Take 237 mLs by mouth daily. (Patient taking differently: Take 237 mLs by  mouth daily with lunch. )  0  . ondansetron (ZOFRAN ODT) 8 MG disintegrating tablet Take 1 tablet (8 mg total) by mouth every 8 (eight) hours as needed for nausea or vomiting. 10 tablet 0  . pantoprazole (PROTONIX) 40 MG tablet Take 40 mg by mouth daily with breakfast.     . polyethylene glycol (MIRALAX / GLYCOLAX) packet Take 17 g by mouth daily.     . potassium chloride SA (K-DUR,KLOR-CON) 20 MEQ tablet Take 1 tablet (20 mEq total) by mouth 2 (two) times daily. (Patient taking differently: Take 20 mEq by mouth daily with breakfast. ) 60 tablet 0  . prednisoLONE acetate (PRED FORTE) 1 % ophthalmic suspension Place 1 drop into both eyes  at bedtime.     . sevelamer carbonate (RENVELA) 800 MG tablet Take 2,400 mg by mouth 3 (three) times daily with meals.     . traZODone (DESYREL) 50 MG tablet Take 50 mg by mouth at bedtime.    . carvedilol (COREG) 3.125 MG tablet Take 1 tablet (3.125 mg total) by mouth 2 (two) times daily with a meal. 60 tablet 0  . clopidogrel (PLAVIX) 75 MG tablet Take 1 tablet (75 mg total) by mouth daily with breakfast. 90 tablet 0  . furosemide (LASIX) 40 MG tablet Take 80 mg by mouth 2 (two) times daily with breakfast and lunch.     Marland Kitchen amLODipine (NORVASC) 2.5 MG tablet Take 1 tablet (2.5 mg total) by mouth daily. (Patient taking differently: Take 5 mg by mouth daily. ) 90 tablet 3  . atorvastatin (LIPITOR) 40 MG tablet Take 1 tablet (40 mg total) by mouth daily at 6 PM. 90 tablet 0   No facility-administered medications prior to visit.     Allergies:   Contrast media [iodinated diagnostic agents] and Tape   Social History   Socioeconomic History  . Marital status: Widowed    Spouse name: Not on file  . Number of children: Not on file  . Years of education: Not on file  . Highest education level: Not on file  Occupational History  . Not on file  Tobacco Use  . Smoking status: Never Smoker  . Smokeless tobacco: Former Systems developer    Types: Snuff  . Tobacco comment: "no  snuff since < 1980s"  Substance and Sexual Activity  . Alcohol use: Never  . Drug use: Never  . Sexual activity: Not Currently  Other Topics Concern  . Not on file  Social History Narrative  . Not on file   Social Determinants of Health   Financial Resource Strain:   . Difficulty of Paying Living Expenses: Not on file  Food Insecurity:   . Worried About Charity fundraiser in the Last Year: Not on file  . Ran Out of Food in the Last Year: Not on file  Transportation Needs:   . Lack of Transportation (Medical): Not on file  . Lack of Transportation (Non-Medical): Not on file  Physical Activity:   . Days of Exercise per Week: Not on file  . Minutes of Exercise per Session: Not on file  Stress:   . Feeling of Stress : Not on file  Social Connections:   . Frequency of Communication with Friends and Family: Not on file  . Frequency of Social Gatherings with Friends and Family: Not on file  . Attends Religious Services: Not on file  . Active Member of Clubs or Organizations: Not on file  . Attends Archivist Meetings: Not on file  . Marital Status: Not on file     Family History: Parents are deceased.  ROS General: Negative; No fevers, chills, or night sweats;  HEENT: Negative; No changes in vision or hearing, sinus congestion, difficulty swallowing Pulmonary: Negative; No cough, wheezing, shortness of breath, hemoptysis Cardiovascular: See HPI GI: Negative; No nausea, vomiting, diarrhea, or abdominal pain GU: End-stage renal disease currently on peritoneal dialysis Musculoskeletal: Negative; no myalgias, joint pain, or weakness Hematologic/Oncology: Negative; no easy bruising, bleeding Endocrine: Negative; no heat/cold intolerance; no diabetes Neuro: Negative; no changes in balance, headaches Skin: Negative; No rashes or skin lesions Psychiatric: Negative; No behavioral problems, depression Sleep: Negative; No snoring, daytime sleepiness, hypersomnolence,  bruxism, restless legs, hypnogognic hallucinations, no  cataplexy Other comprehensive 14 point system review is negative.   PHYSICAL EXAM:   VS:  BP (!) 148/72   Pulse 70   Ht 5' 1"  (1.549 m)   Wt 135 lb 3.2 oz (61.3 kg)   SpO2 97%   BMI 25.55 kg/m     Repeat blood pressure by me was 132/72  Wt Readings from Last 3 Encounters:  07/31/19 135 lb 3.2 oz (61.3 kg)  04/30/19 128 lb (58.1 kg)  09/17/18 140 lb 14 oz (63.9 kg)    General: Alert, oriented, no distress.  Skin: normal turgor, no rashes, warm and dry HEENT: Normocephalic, atraumatic. Pupils equal round and reactive to light; sclera anicteric; extraocular muscles intact;  Nose without nasal septal hypertrophy Mouth/Parynx benign; Mallinpatti scale 3 Neck: No JVD, no carotid bruits; normal carotid upstroke Lungs: clear to ausculatation and percussion; no wheezing or rales Chest wall: without tenderness to palpitation Heart: PMI not displaced, RRR, s1 s2 normal, 1/6 systolic murmur, no diastolic murmur, no rubs, gallops, thrills, or heaves Abdomen: soft, nontender; no hepatosplenomehaly, BS+; abdominal aorta nontender and not dilated by palpation. Back: no CVA tenderness Pulses 2+ Musculoskeletal: full range of motion, normal strength, no joint deformities Extremities: 1+ edema above her sock line bilaterally; no clubbing cyanosis, Homan's sign negative  Neurologic: grossly nonfocal; Cranial nerves grossly wnl Psychologic: Normal mood and affect   Studies/Labs Reviewed:   EKG:  EKG is ordered today.  ECG (independently read by me): Rhythm with first-degree AV block with a PR interval proximately 240 ms.  EKG interval 4 7 5  ms.  QS complex V1 V2.  No ectopy  Recent Labs: BMP Latest Ref Rng & Units 09/22/2018 09/17/2018 09/16/2018  Glucose 70 - 99 mg/dL 123(H) 139(H) 167(H)  BUN 8 - 23 mg/dL 44(H) 42(H) 37(H)  Creatinine 0.44 - 1.00 mg/dL 5.13(H) 4.25(H) 4.13(H)  Sodium 135 - 145 mmol/L 135 132(L) 131(L)  Potassium 3.5 -  5.1 mmol/L 3.5 4.0 3.9  Chloride 98 - 111 mmol/L 97(L) 93(L) 94(L)  CO2 22 - 32 mmol/L 26 26 27   Calcium 8.9 - 10.3 mg/dL 8.1(L) 8.0(L) 7.7(L)     Hepatic Function Latest Ref Rng & Units 09/22/2018 09/17/2018 09/16/2018  Total Protein 6.5 - 8.1 g/dL 5.2(L) - -  Albumin 3.5 - 5.0 g/dL 1.9(L) 1.5(L) 1.4(L)  AST 15 - 41 U/L 32 - -  ALT 0 - 44 U/L 30 - -  Alk Phosphatase 38 - 126 U/L 130(H) - -  Total Bilirubin 0.3 - 1.2 mg/dL 0.5 - -  Bilirubin, Direct 0.0 - 0.2 mg/dL - - -    CBC Latest Ref Rng & Units 09/22/2018 09/17/2018 09/16/2018  WBC 4.0 - 10.5 K/uL 9.9 7.2 5.6  Hemoglobin 12.0 - 15.0 g/dL 8.8(L) 8.8(L) 8.0(L)  Hematocrit 36.0 - 46.0 % 28.8(L) 29.3(L) 26.5(L)  Platelets 150 - 400 K/uL 118(L) 116(L) 118(L)   Lab Results  Component Value Date   MCV 91.1 09/22/2018   MCV 90.4 09/17/2018   MCV 90.1 09/16/2018   Lab Results  Component Value Date   TSH 3.439 09/07/2018   Lab Results  Component Value Date   HGBA1C 5.7 (H) 09/06/2018     BNP No results found for: BNP  ProBNP No results found for: PROBNP   Lipid Panel     Component Value Date/Time   CHOL 160 09/05/2018 2348   TRIG 159 (H) 09/05/2018 2348   HDL 74 09/05/2018 2348   CHOLHDL 2.2 09/05/2018 2348   VLDL 32  09/05/2018 2348   [place] 54 09/05/2018 2348     RADIOLOGY: No results found.   Additional studies/ records that were reviewed today include:  *EMERGENT CATH/PCI  Ost Cx to Prox Cx lesion is 99% stenosed.  Prox Cx lesion is 100% stenosed.  A drug-eluting stent was successfully placed.  Post intervention, there is a 0% residual stenosis.  A stent was successfully placed.  Post intervention, there is a 50% residual stenosis.   IMPRESSION:Ms. Bircher had a inferoposterior MI secondary to thrombotic occlusion of the proximal circumflex as well as the continuation of the AV groove. She had a normal LAD and a normal large dominant RCA. I was able to reestablish flow in the proximal  circumflex and OM branch which was a fairly large vessel but got a suboptimal result in the AV groove continuation going into a small PLA branch. She was pain-free at the end of the case. It should be noted that the patient was chronically thrombocytopenic with a platelet count in the 50,000 range. I am going to continue Angiomax for 4 hours full dose. Troponins will be cycled. 2D echo was ordered. Careful attention to groin management during sheath removal because of her thrombocytopenia. The patient left lab in stable condition. She will need to be on DAPT for at least 12 months uninterrupted.    Intervention      ECHO 09/06/2018 IMPRESSIONS 1. The left ventricle has low normal systolic function, with an ejection fraction of 50-55%. The cavity size was normal. Left ventricular diastolic Doppler parameters are consistent with pseudonormalization. Elevated left ventricular end-diastolic  pressure. 2. Distal septal and posterior lateral hypokinesis. 3. The right ventricle has normal systolic function. The cavity was normal. There is no increase in right ventricular wall thickness. 4. Trivial pericardial effusion is present. 5. Mild thickening of the mitral valve leaflet. Mild calcification of the mitral valve leaflet. Mitral valve regurgitation is mild to moderate by color flow Doppler. 6. The aortic valve is tricuspid. Moderate thickening of the aortic valve. Moderate calcification of the aortic valve. Aortic valve regurgitation is mild by color flow Doppler. 7. The aortic root is normal in size and structure. 8. The interatrial septum was not well visualized.    ASSESSMENT:    1. Acute ST elevation myocardial infarction (STEMI) of posterior wall [facility]): April 2020 with DES stent to proximal and mid left circumflex vessel   2. ESRD on dialysis ([place])   3. Essential hypertension   4. Lower extremity edema   5. Type 2 diabetes mellitus with complication, without  long-term current use of insulin ([place])   6. Hypothyroidism, unspecified type   7. Anemia, unspecified type     PLAN:   1.  CAD/inferoposterior MI April 2020: Secondary to subtotal thrombotic occlusion of her proximal circumflex for which he underwent stenting of her proximal and mid circumflex vessels.  No recurrent anginal symptoms on amlodipine now at 5 mg, carvedilol 3.125 mg twice a day, furosemide in addition to DAPT with aspirin/Plavix.  2.  End-stage renal disease: Now back on peritoneal dialysis.  Increasing volume now requiring 4.25% diastole.  Patient states dry weight is 125.  Weight today is 135.  3.  Essential hypertension: Initial blood pressure elevated 148/72 but improved at 132/72 on repeat by me.  4.  Lower extremity edema: She at present is on furosemide 80 mg twice a day which has been increased by nephrology.  She continues to have 1+ edema.  I have recommended support stockings with 20  to 30 mm pressure support.  5.  Type 2 diabetes mellitus with complication not on insulin.  Currently on glipizide and Tradjenta.  6.  Hypothyroidism: Currently on levothyroxine 100 mcg.  7.  Anemia: Recently received packed red blood cell transfusion several weeks ago.  Laboratory followed by nephrology.  Patient is to see Dr. Melina Copa in [facility] for GI evaluation.  Reportedly guaiac negative in past.  Medication Adjustments/Labs and Tests Ordered: Current medicines are reviewed at length with the patient today.  Concerns regarding medicines are outlined above.  Medication changes, Labs and Tests ordered today are listed in the Patient Instructions below. Patient Instructions  Medication Instructions:  REFILLS FOR MEDICATIONS SENT TO PHARMACY.  *If you need a refill on your cardiac medications before your next appointment, please call your pharmacy*   Follow-Up: At [facility], you and your health needs are our priority.  As part of our continuing mission to provide you with  exceptional heart care, we have created designated Provider Care Teams.  These Care Teams include your primary Cardiologist (physician) and Advanced Practice Providers (APPs -  Physician Assistants and Nurse Practitioners) who all work together to provide you with the care you need, when you need it.  We recommend signing up for the patient portal called "MyChart".  Sign up information is provided on this After Visit Summary.  MyChart is used to connect with patients for Virtual Visits (Telemedicine).  Patients are able to view lab/test results, encounter notes, upcoming appointments, etc.  Non-urgent messages can be sent to your provider as well.   To learn more about what you can do with MyChart, go to [email or web address].    Your next appointment:   3 month(s)  The format for your next appointment:   In Person  Provider:   Shelva Majestic, MD   Other Instructions COMPRESSION STOCKING 20-30MM  FOLLOW UP WITH YOUR GI DOCTOR- IF YOU NEED COLONOSCOPY, MAY HOLD PLAVIX FOR 5 DAYS PRIOR TO PROCEDURE.-- CALL OUR OFFICE WITH QUESTIONS [phone]     Signed, Shelva Majestic, MD  08/02/2019 12:06 PM    [facility] [address] [place], [place], [place]  [address] Phone: [phone]

## 2019-07-31 NOTE — Patient Instructions (Signed)
Medication Instructions:  REFILLS FOR MEDICATIONS SENT TO PHARMACY.  *If you need a refill on your cardiac medications before your next appointment, please call your pharmacy*   Follow-Up: At [facility], you and your health needs are our priority.  As part of our continuing mission to provide you with exceptional heart care, we have created designated Provider Care Teams.  These Care Teams include your primary Cardiologist (physician) and Advanced Practice Providers (APPs -  Physician Assistants and Nurse Practitioners) who all work together to provide you with the care you need, when you need it.  We recommend signing up for the patient portal called "MyChart".  Sign up information is provided on this After Visit Summary.  MyChart is used to connect with patients for Virtual Visits (Telemedicine).  Patients are able to view lab/test results, encounter notes, upcoming appointments, etc.  Non-urgent messages can be sent to your provider as well.   To learn more about what you can do with MyChart, go to [email or web address].    Your next appointment:   3 month(s)  The format for your next appointment:   In Person  Provider:   Shelva Majestic, MD   Other Instructions COMPRESSION STOCKING 20-30MM  FOLLOW UP WITH YOUR GI DOCTOR- IF YOU NEED COLONOSCOPY, MAY HOLD PLAVIX FOR 5 DAYS PRIOR TO PROCEDURE.-- CALL OUR OFFICE WITH QUESTIONS [phone]

## 2019-08-02 ENCOUNTER — Encounter: Payer: Self-pay | Admitting: Cardiovascular Disease

## 2019-11-02 ENCOUNTER — Other Ambulatory Visit: Payer: Self-pay | Admitting: Cardiovascular Disease

## 2019-11-17 ENCOUNTER — Ambulatory Visit: Payer: Medicare Other | Admitting: Cardiovascular Disease

## 2020-01-13 ENCOUNTER — Other Ambulatory Visit: Payer: Self-pay | Admitting: Cardiovascular Disease

## 2020-01-21 ENCOUNTER — Encounter: Payer: Self-pay | Admitting: Cardiovascular Disease

## 2020-01-21 ENCOUNTER — Telehealth (INDEPENDENT_AMBULATORY_CARE_PROVIDER_SITE_OTHER): Payer: Medicare Other | Admitting: Cardiovascular Disease

## 2020-01-21 VITALS — BP 118/74 | HR 67 | Temp 97.6°F | Wt 141.2 lb

## 2020-01-21 DIAGNOSIS — E039 Hypothyroidism, unspecified: Secondary | ICD-10-CM

## 2020-01-21 DIAGNOSIS — I2129 ST elevation (STEMI) myocardial infarction involving other sites: Secondary | ICD-10-CM | POA: Diagnosis not present

## 2020-01-21 DIAGNOSIS — E118 Type 2 diabetes mellitus with unspecified complications: Secondary | ICD-10-CM

## 2020-01-21 DIAGNOSIS — N186 End stage renal disease: Secondary | ICD-10-CM | POA: Diagnosis not present

## 2020-01-21 DIAGNOSIS — I10 Essential (primary) hypertension: Secondary | ICD-10-CM

## 2020-01-21 DIAGNOSIS — Z992 Dependence on renal dialysis: Secondary | ICD-10-CM

## 2020-01-21 NOTE — Progress Notes (Signed)
Virtual Visit via Telephone Note   This visit type was conducted due to national recommendations for restrictions regarding the COVID-19 Pandemic (e.g. social distancing) in an effort to limit this patient's exposure and mitigate transmission in our community.  Due to her co-morbid illnesses, this patient is at least at moderate risk for complications without adequate follow up.  This format is felt to be most appropriate for this patient at this time.  The patient did not have access to video technology/had technical difficulties with video requiring transitioning to audio format only (telephone).  All issues noted in this document were discussed and addressed.  No physical exam could be performed with this format.  Please refer to the patient's chart for her  consent to telehealth for [facility].    Date:  01/21/2020   ID:  Samantha Conrad, DOB 1940-08-08, MRN [id number] The patient was identified using 2 identifiers.  Patient Location: Home Provider Location: Office/Clinic  PCP:  [facility]  Cardiologist:    Shelva Majestic, MD Electrophysiologist:  None   Evaluation Performed:  Follow-Up Visit  Chief Complaint:  5 month F/U  History of Present Illness:    Samantha Conrad is a 79 y.o. female with end-stage renal disease on peritoneal dialysis, diabetes mellitus, prior stroke, thrombocytopenia who developed new onset of severe chest pain around dinnertime on September 05, 2018. She presented to the emergency room lateintheevening and STEMI was activated.She was taken emergently to the catheterization laboratory by Dr. Gwenlyn Found.She was found to have thrombotic occlusion of the proximal circumflex as well as the continuation branch of the AV groove. She had a normal LAD and large normal dominant RCA. She underwent stenting of the proximal circumflex with an excellent result but had a suboptimal resultatthe AV groove circumflex with residual narrowing of 50%.  The  patient had never seen Dr. Gwenlyn Found in follow-up of her hospitalization.  She ultimately was on my schedule in December 2020 and I saw her in a telemedicine evaluation. She lives by herself in [place] but her daughter lives several houses away. She is on dialysis and recently was back on hemodialysis.   When I evaluated her she was in a training session again to resume peritoneal dialysis.  At that evaluation she denied any episodes of recurrent chest pain or significant shortness of breath.  She did not note blood pressure lability.  I saw her in the office for an in person evaluation in March 2021. She is back on peritoneal dialysis which she does at night.  She has issues with chronic anemia and several weeks ago had required blood cell transfusion.  She states her dry rate is 125.  She recently had significant increase in fluid weight in her peritoneal dialysate had been increased to 4.25%.  She admits to leg swelling.  She denies any angina.  She was also undergoing GI evaluation in [place].  Since my last evaluation, she has remained stable from a cardiac standpoint.  She specifically denies any chest pain or shortness of breath.  She undergoes peritoneal dialysis every evening.  Typically her blood pressure is lower in the morning.  She will be establishing with her new nephrologist Dr. Johnney Ou.  She presents for evaluation   The patient does not have symptoms concerning for COVID-19 infection (fever, chills, cough, or new shortness of breath).    Past Medical History:  Diagnosis Date  . Anemia   . Basal cell carcinoma     on nose/face  .  CAD (coronary artery disease)    a. 08/2018: posterior STEMI s/p DES to LCx x2  . Depression   . ESRD on peritoneal dialysis ([place])    "q night" (11/29/2017)  . Fatty liver   . Hypertension   . Hypothyroidism   . NASH (nonalcoholic steatohepatitis)   . Seizures ([place])    ? when she may have had a stroke sometime in the spring of 2018  . Sleep apnea     diagnosed years ago "after I'd gained alot of weight;  no longer a problem since weight loss"  . Stroke [facility]) 04/2016   memory issues, lost 1/2 vision in both eyes  . Thrombocytopenia ([place])   . Thyroid disease   . Type II diabetes mellitus ([place])    Past Surgical History:  Procedure Laterality Date  . ABDOMINAL HYSTERECTOMY    . AV FISTULA PLACEMENT Right 10/30/2016   Procedure: INSERTION OF ARTERIOVENOUS (AV) GORE-TEX GRAFT RIGHT UPPER ARM;  Surgeon: Elam Dutch, MD;  Location: [facility];  Service: Vascular;  Laterality: Right;  . CATARACT EXTRACTION W/ INTRAOCULAR LENS  IMPLANT, BILATERAL Bilateral   . CHOLECYSTECTOMY OPEN    . CORNEAL TRANSPLANT Bilateral   . CORONARY ANGIOGRAPHY N/A 09/06/2018   Procedure: CORONARY ANGIOGRAPHY;  Surgeon: Lorretta Harp, MD;  Location: [facility] CV [facility];  Service: Cardiovascular;  Laterality: N/A;  . CORONARY/GRAFT ACUTE MI REVASCULARIZATION N/A 09/06/2018   Procedure: Coronary/Graft Acute MI Revascularization;  Surgeon: Lorretta Harp, MD;  Location: [facility] CV [facility];  Service: Cardiovascular;  Laterality: N/A;  . EYE SURGERY    . KNEE ARTHROSCOPY Left   . LEFT HEART CATH AND CORONARY ANGIOGRAPHY N/A 09/06/2018   Procedure: LEFT HEART CATH AND CORONARY ANGIOGRAPHY;  Surgeon: Lorretta Harp, MD;  Location: [facility];  Service: Cardiovascular;  Laterality: N/A;  . MOHS SURGERY     "nose"  . SHOULDER OPEN ROTATOR CUFF REPAIR Left   . THROMBECTOMY AND REVISION OF ARTERIOVENTOUS (AV) GORETEX  GRAFT Right 12/04/2016   Procedure: THROMBECTOMY/ REVISION OF RIGHT UPPER ARM ARTERIOVENOUS GORETEX GRAFT;  Surgeon: Elam Dutch, MD;  Location: [facility];  Service: Vascular;  Laterality: Right;  . TONSILLECTOMY       Current Meds  Medication Sig  . acetaminophen-codeine (TYLENOL #3) 300-30 MG tablet Take 1-2 tablets by mouth every 6 (six) hours as needed for moderate pain.  . Amino Acids-Protein Hydrolys (FEEDING SUPPLEMENT, PRO-STAT SUGAR  FREE 64,) LIQD Take 30 mLs by mouth 2 (two) times daily.  Marland Kitchen aspirin 81 MG chewable tablet Chew 1 tablet (81 mg total) by mouth daily.  Marland Kitchen atorvastatin (LIPITOR) 40 MG tablet TAKE 1 TABLET (40 MG TOTAL) BY MOUTH DAILY AT 6 PM.  . carvedilol (COREG) 3.125 MG tablet Take 1 tablet (3.125 mg total) by mouth 2 (two) times daily with a meal.  . citalopram (CELEXA) 10 MG tablet Take 10 mg by mouth daily with breakfast.   . clopidogrel (PLAVIX) 75 MG tablet TAKE 1 TABLET (75 MG TOTAL) BY MOUTH DAILY WITH BREAKFAST.  Marland Kitchen docusate sodium (COLACE) 100 MG capsule Take 100 mg by mouth daily with lunch.  Marland Kitchen glipiZIDE (GLUCOTROL) 5 MG tablet Take 5 mg by mouth daily before breakfast.   . levETIRAcetam (KEPPRA) 250 MG tablet Take 250 mg by mouth 2 (two) times daily with a meal.   . levothyroxine (SYNTHROID, LEVOTHROID) 100 MCG tablet Take 100 mcg by mouth daily before breakfast.   . linagliptin (TRADJENTA) 5 MG TABS  tablet Take 5 mg by mouth daily with breakfast.   . lipase/protease/amylase (CREON) 12000 units CPEP capsule Take 48,000 Units by mouth 3 (three) times daily with meals.  . multivitamin (PROSIGHT) TABS tablet Take 1 tablet by mouth daily. (Patient taking differently: Take 1 tablet by mouth daily with breakfast. )  . Nutritional Supplements (FEEDING SUPPLEMENT, NEPRO CARB STEADY,) LIQD Take 237 mLs by mouth daily. (Patient taking differently: Take 237 mLs by mouth daily with lunch. )  . ondansetron (ZOFRAN ODT) 8 MG disintegrating tablet Take 1 tablet (8 mg total) by mouth every 8 (eight) hours as needed for nausea or vomiting.  . pantoprazole (PROTONIX) 40 MG tablet Take 40 mg by mouth daily with breakfast.   . polyethylene glycol (MIRALAX / GLYCOLAX) packet Take 17 g by mouth daily.   . prednisoLONE acetate (PRED FORTE) 1 % ophthalmic suspension Place 1 drop into both eyes at bedtime.   . sevelamer carbonate (RENVELA) 800 MG tablet Take 2,400 mg by mouth 3 (three) times daily with meals.   . traZODone  (DESYREL) 50 MG tablet Take 50 mg by mouth at bedtime.     Allergies:   Contrast media [iodinated diagnostic agents] and Tape   Social History   Tobacco Use  . Smoking status: Never Smoker  . Smokeless tobacco: Former Systems developer    Types: Snuff  . Tobacco comment: "no snuff since < 1980s"  Vaping Use  . Vaping Use: Never used  Substance Use Topics  . Alcohol use: Never  . Drug use: Never     Family Hx: The patient's family history is not on file.  ROS:   Please see the history of present illness.    She denies any fevers chills or night sweats There is no shortness of breath or wheezing She denies recurrent chest pain She has occasional GI issues and has undergone evaluation in [place] She is on peritoneal dialysis at home She denies any recent leg swelling She denies neurologic symptoms. She believes she is sleeping well All other systems reviewed and are negative.   Prior CV studies:   The following studies were reviewed today:  *EMERGENT CATH/PCI  Ost Cx to Prox Cx lesion is 99% stenosed.  Prox Cx lesion is 100% stenosed.  A drug-eluting stent was successfully placed.  Post intervention, there is a 0% residual stenosis.  A stent was successfully placed.  Post intervention, there is a 50% residual stenosis.   IMPRESSION:Ms. Roanhorse had a inferoposterior MI secondary to thrombotic occlusion of the proximal circumflex as well as the continuation of the AV groove. She had a normal LAD and a normal large dominant RCA. I was able to reestablish flow in the proximal circumflex and OM branch which was a fairly large vessel but got a suboptimal result in the AV groove continuation going into a small PLA branch. She was pain-free at the end of the case. It should be noted that the patient was chronically thrombocytopenic with a platelet count in the 50,000 range. I am going to continue Angiomax for 4 hours full dose. Troponins will be cycled. 2D echo was ordered.  Careful attention to groin management during sheath removal because of her thrombocytopenia. The patient left lab in stable condition. She will need to be on DAPT for at least 12 months uninterrupted.    Intervention      ECHO 09/06/2018 IMPRESSIONS 1. The left ventricle has low normal systolic function, with an ejection fraction of 50-55%. The cavity size was normal.  Left ventricular diastolic Doppler parameters are consistent with pseudonormalization. Elevated left ventricular end-diastolic  pressure. 2. Distal septal and posterior lateral hypokinesis. 3. The right ventricle has normal systolic function. The cavity was normal. There is no increase in right ventricular wall thickness. 4. Trivial pericardial effusion is present. 5. Mild thickening of the mitral valve leaflet. Mild calcification of the mitral valve leaflet. Mitral valve regurgitation is mild to moderate by color flow Doppler. 6. The aortic valve is tricuspid. Moderate thickening of the aortic valve. Moderate calcification of the aortic valve. Aortic valve regurgitation is mild by color flow Doppler. 7. The aortic root is normal in size and structure. 8. The interatrial septum was not well visualized.        Labs/Other Tests and Data Reviewed:    EKG: An ECG was not obtained today since this was a virtual encounter but I personally reviewed the ECG from July 31, 2019 which showed Sinus rhythm with first-degree AV block with a PR interval proximately 240 ms.  EKG interval 4 7 5  ms.  QS complex V1 V2.  No ectopy  Recent Labs: No results found for requested labs within last 8760 hours.   Recent Lipid Panel Lab Results  Component Value Date/Time   CHOL 160 09/05/2018 11:48 PM   TRIG 159 (H) 09/05/2018 11:48 PM   HDL 74 09/05/2018 11:48 PM   CHOLHDL 2.2 09/05/2018 11:48 PM   LDLCALC 54 09/05/2018 11:48 PM    Wt Readings from Last 3 Encounters:  01/21/20 141 lb 3.2 oz (64 kg)  07/31/19 135 lb 3.2  oz (61.3 kg)  04/30/19 128 lb (58.1 kg)     Objective:    Vital Signs:  BP 118/74   Pulse 67   Temp 97.6 F (36.4 C)   Wt 141 lb 3.2 oz (64 kg)   BMI 26.68 kg/m    Since this was a virtual visit I could not physically examine the patient. Breathing was normal and not labored She denied any significant swelling Her blood pressure in the morning is typically normal but sometimes later in the day her blood pressure may go up to 269 systolic. She denied any chest wall pain Palpation of her rhythm reportedly was stable She denied edema  ASSESSMENT & PLAN:    1. 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  Emergent catheterization revealed subtotal thrombotic occlusion of her proximal circumflex.  She is status post stenting of the proximal and mid circumflex vessels.  She is not having any recurrent anginal symptomatology on her regimen now consisting of amlodipine 5 mg, carvedilol 3.125 mg twice a day, and presently she continues to be on furosemide.  She had been taking 160 mg twice a day for 6 weeks but recently this was decreased to 80 mg twice a day.  She continues to be on DAPT with aspirin/Plavix 2. End-stage renal disease: Now back on peritoneal dialysis.  Weight today is 141 by her report at home.  She undergoes nightly peritoneal dialysis.   3. Essential hypertension: Blood pressure following dialysis this morning was excellent.  However her blood pressure later in the day oftentimes may increase to the 1 48-5 50 range systolically 4. Lower extremity edema: Currently resolved.  She had been on Lasix 160 mg twice a day for 6 weeks but and recently was reduced back to 80 mg twice a day. 5. Diabetes mellitus: She continues to be on Tradjenta in addition to glipizide. 6. Hyperlipidemia: Currently on atorvastatin 40 mg with target LDL less  than 70 7. Hypothyroidism: On levothyroxine   COVID-19 Education: The signs and symptoms of COVID-19 were discussed with the patient and how to seek  care for testing (follow up with PCP or arrange E-visit).  The importance of social distancing was discussed today.  Time:   Today, I have spent 20 minutes with the patient with telehealth technology discussing the above problems.     Medication Adjustments/Labs and Tests Ordered: Current medicines are reviewed at length with the patient today.  Concerns regarding medicines are outlined above.   Tests Ordered: No orders of the defined types were placed in this encounter.   Medication Changes: No orders of the defined types were placed in this encounter.   Follow Up: 8-monthand office visit  Signed, TShelva Majestic MD  01/21/2020 9:44 AM    [place]

## 2020-01-21 NOTE — Patient Instructions (Signed)

## 2020-01-23 ENCOUNTER — Encounter: Payer: Self-pay | Admitting: Cardiovascular Disease

## 2020-02-10 ENCOUNTER — Other Ambulatory Visit: Payer: Self-pay | Admitting: Cardiovascular Disease

## 2020-03-19 ENCOUNTER — Encounter (HOSPITAL_COMMUNITY): Payer: Self-pay | Admitting: Emergency Medicine

## 2020-03-19 ENCOUNTER — Inpatient Hospital Stay (HOSPITAL_COMMUNITY)
Admission: EM | Admit: 2020-03-19 | Discharge: 2020-03-22 | DRG: 871 | Disposition: A | Discharge status: Home / Self Care | Payer: Medicare Other | Attending: Internal Medicine | Admitting: Internal Medicine

## 2020-03-19 ENCOUNTER — Emergency Department (HOSPITAL_COMMUNITY): Payer: Medicare Other

## 2020-03-19 DIAGNOSIS — G40909 Epilepsy, unspecified, not intractable, without status epilepticus: Secondary | ICD-10-CM | POA: Diagnosis present

## 2020-03-19 DIAGNOSIS — N186 End stage renal disease: Secondary | ICD-10-CM | POA: Diagnosis present

## 2020-03-19 DIAGNOSIS — E876 Hypokalemia: Secondary | ICD-10-CM | POA: Diagnosis present

## 2020-03-19 DIAGNOSIS — R0603 Acute respiratory distress: Secondary | ICD-10-CM | POA: Diagnosis present

## 2020-03-19 DIAGNOSIS — Z961 Presence of intraocular lens: Secondary | ICD-10-CM | POA: Diagnosis present

## 2020-03-19 DIAGNOSIS — J189 Pneumonia, unspecified organism: Secondary | ICD-10-CM

## 2020-03-19 DIAGNOSIS — Z7902 Long term (current) use of antithrombotics/antiplatelets: Secondary | ICD-10-CM

## 2020-03-19 DIAGNOSIS — Z79899 Other long term (current) drug therapy: Secondary | ICD-10-CM

## 2020-03-19 DIAGNOSIS — Z91048 Other nonmedicinal substance allergy status: Secondary | ICD-10-CM

## 2020-03-19 DIAGNOSIS — A419 Sepsis, unspecified organism: Principal | ICD-10-CM | POA: Diagnosis present

## 2020-03-19 DIAGNOSIS — D696 Thrombocytopenia, unspecified: Secondary | ICD-10-CM | POA: Diagnosis present

## 2020-03-19 DIAGNOSIS — I69312 Visuospatial deficit and spatial neglect following cerebral infarction: Secondary | ICD-10-CM

## 2020-03-19 DIAGNOSIS — D631 Anemia in chronic kidney disease: Secondary | ICD-10-CM | POA: Diagnosis present

## 2020-03-19 DIAGNOSIS — R1313 Dysphagia, pharyngeal phase: Secondary | ICD-10-CM | POA: Diagnosis present

## 2020-03-19 DIAGNOSIS — E860 Dehydration: Secondary | ICD-10-CM | POA: Diagnosis present

## 2020-03-19 DIAGNOSIS — R569 Unspecified convulsions: Secondary | ICD-10-CM

## 2020-03-19 DIAGNOSIS — Z9842 Cataract extraction status, left eye: Secondary | ICD-10-CM

## 2020-03-19 DIAGNOSIS — I69311 Memory deficit following cerebral infarction: Secondary | ICD-10-CM

## 2020-03-19 DIAGNOSIS — J690 Pneumonitis due to inhalation of food and vomit: Secondary | ICD-10-CM | POA: Diagnosis present

## 2020-03-19 DIAGNOSIS — Z947 Corneal transplant status: Secondary | ICD-10-CM

## 2020-03-19 DIAGNOSIS — Z79890 Hormone replacement therapy (postmenopausal): Secondary | ICD-10-CM

## 2020-03-19 DIAGNOSIS — Z992 Dependence on renal dialysis: Secondary | ICD-10-CM

## 2020-03-19 DIAGNOSIS — Z955 Presence of coronary angioplasty implant and graft: Secondary | ICD-10-CM

## 2020-03-19 DIAGNOSIS — I252 Old myocardial infarction: Secondary | ICD-10-CM

## 2020-03-19 DIAGNOSIS — Z9861 Coronary angioplasty status: Secondary | ICD-10-CM | POA: Diagnosis present

## 2020-03-19 DIAGNOSIS — Z7982 Long term (current) use of aspirin: Secondary | ICD-10-CM

## 2020-03-19 DIAGNOSIS — D649 Anemia, unspecified: Secondary | ICD-10-CM | POA: Diagnosis present

## 2020-03-19 DIAGNOSIS — Z91041 Radiographic dye allergy status: Secondary | ICD-10-CM

## 2020-03-19 DIAGNOSIS — Z20822 Contact with and (suspected) exposure to covid-19: Secondary | ICD-10-CM | POA: Diagnosis present

## 2020-03-19 DIAGNOSIS — Z87891 Personal history of nicotine dependence: Secondary | ICD-10-CM

## 2020-03-19 DIAGNOSIS — K7581 Nonalcoholic steatohepatitis (NASH): Secondary | ICD-10-CM | POA: Diagnosis present

## 2020-03-19 DIAGNOSIS — K861 Other chronic pancreatitis: Secondary | ICD-10-CM | POA: Diagnosis present

## 2020-03-19 DIAGNOSIS — Z831 Family history of other infectious and parasitic diseases: Secondary | ICD-10-CM

## 2020-03-19 DIAGNOSIS — E785 Hyperlipidemia, unspecified: Secondary | ICD-10-CM | POA: Diagnosis present

## 2020-03-19 DIAGNOSIS — E039 Hypothyroidism, unspecified: Secondary | ICD-10-CM | POA: Diagnosis present

## 2020-03-19 DIAGNOSIS — E1122 Type 2 diabetes mellitus with diabetic chronic kidney disease: Secondary | ICD-10-CM | POA: Diagnosis present

## 2020-03-19 DIAGNOSIS — K219 Gastro-esophageal reflux disease without esophagitis: Secondary | ICD-10-CM | POA: Diagnosis present

## 2020-03-19 DIAGNOSIS — Z66 Do not resuscitate: Secondary | ICD-10-CM | POA: Diagnosis present

## 2020-03-19 DIAGNOSIS — E119 Type 2 diabetes mellitus without complications: Secondary | ICD-10-CM

## 2020-03-19 DIAGNOSIS — Z85828 Personal history of other malignant neoplasm of skin: Secondary | ICD-10-CM

## 2020-03-19 DIAGNOSIS — K7460 Unspecified cirrhosis of liver: Secondary | ICD-10-CM | POA: Diagnosis present

## 2020-03-19 DIAGNOSIS — Z7984 Long term (current) use of oral hypoglycemic drugs: Secondary | ICD-10-CM

## 2020-03-19 DIAGNOSIS — Z9841 Cataract extraction status, right eye: Secondary | ICD-10-CM

## 2020-03-19 DIAGNOSIS — I2510 Atherosclerotic heart disease of native coronary artery without angina pectoris: Secondary | ICD-10-CM | POA: Diagnosis present

## 2020-03-19 DIAGNOSIS — F32A Depression, unspecified: Secondary | ICD-10-CM | POA: Diagnosis present

## 2020-03-19 LAB — CBC
HCT: 26.7 % — ABNORMAL LOW (ref 36.0–46.0)
Hemoglobin: 8.2 g/dL — ABNORMAL LOW (ref 12.0–15.0)
MCH: 29.9 pg (ref 26.0–34.0)
MCHC: 30.7 g/dL (ref 30.0–36.0)
MCV: 97.4 fL (ref 80.0–100.0)
Platelets: 89 10*3/uL — ABNORMAL LOW (ref 150–400)
RBC: 2.74 MIL/uL — ABNORMAL LOW (ref 3.87–5.11)
RDW: 15.1 % (ref 11.5–15.5)
WBC: 16.9 10*3/uL — ABNORMAL HIGH (ref 4.0–10.5)
nRBC: 0.2 % (ref 0.0–0.2)

## 2020-03-19 LAB — BASIC METABOLIC PANEL
Anion gap: 14 (ref 5–15)
BUN: 47 mg/dL — ABNORMAL HIGH (ref 8–23)
CO2: 25 mmol/L (ref 22–32)
Calcium: 9.1 mg/dL (ref 8.9–10.3)
Chloride: 96 mmol/L — ABNORMAL LOW (ref 98–111)
Creatinine, Ser: 5.76 mg/dL — ABNORMAL HIGH (ref 0.44–1.00)
GFR, Estimated: 7 mL/min — ABNORMAL LOW (ref >60)
Glucose, Bld: 253 mg/dL — ABNORMAL HIGH (ref 70–99)
Potassium: 3.4 mmol/L — ABNORMAL LOW (ref 3.5–5.1)
Sodium: 135 mmol/L (ref 135–145)

## 2020-03-19 LAB — LACTIC ACID, PLASMA: Lactic Acid, Venous: 2.3 mmol/L (ref 0.5–1.9)

## 2020-03-19 LAB — TROPONIN I (HIGH SENSITIVITY)
Troponin I (High Sensitivity): 35 ng/L — ABNORMAL HIGH (ref <18)
Troponin I (High Sensitivity): 36 ng/L — ABNORMAL HIGH (ref <18)

## 2020-03-19 MED ORDER — SODIUM CHLORIDE 0.9 % IV SOLN
500.0000 mg | Freq: Once | INTRAVENOUS | Status: AC
  Administered 2020-03-19: 500 mg via INTRAVENOUS
  Filled 2020-03-19: qty 500

## 2020-03-19 MED ORDER — SODIUM CHLORIDE 0.9 % IV SOLN
2.0000 g | Freq: Once | INTRAVENOUS | Status: AC
  Administered 2020-03-19: 2 g via INTRAVENOUS
  Filled 2020-03-19: qty 20

## 2020-03-19 NOTE — ED Provider Notes (Signed)
[facility] EMERGENCY DEPARTMENT Provider Note   CSN: [id number] Arrival date & time: 03/19/20  1448     History Chief Complaint  Patient presents with  . Pneumonia    Samantha Conrad is a 79 y.o. female.  The history is provided by the patient.  Cough Cough characteristics:  Non-productive Severity:  Moderate Onset quality:  Gradual Duration:  1 week Timing:  Constant Progression:  Unchanged Chronicity:  New Relieved by:  None tried Associated symptoms: chills, fever, headaches and shortness of breath   Associated symptoms: no chest pain, no rhinorrhea and no sore throat   Fever:    Duration:  3 days   Max temp PTA:  103 F   Temp source:  Temporal   Progression:  Waxing and waning Pneumonia This is a new problem. The current episode started 12 to 24 hours ago. The problem has not changed since onset.Associated symptoms include headaches and shortness of breath. Pertinent negatives include no chest pain and no abdominal pain.       Past Medical History:  Diagnosis Date  . Anemia   . Basal cell carcinoma     on nose/face  . CAD (coronary artery disease)    a. 08/2018: posterior STEMI s/p DES to LCx x2  . Depression   . ESRD on peritoneal dialysis ([place])    "q night" (11/29/2017)  . Fatty liver   . Hypertension   . Hypothyroidism   . NASH (nonalcoholic steatohepatitis)   . Seizures ([place])    ? when she may have had a stroke sometime in the spring of 2018  . Sleep apnea    diagnosed years ago "after I'd gained alot of weight;  no longer a problem since weight loss"  . Stroke [facility]) 04/2016   memory issues, lost 1/2 vision in both eyes  . Thrombocytopenia ([place])   . Thyroid disease   . Type II diabetes mellitus [facility])     Patient Active Problem List   Diagnosis Date Noted  . Peritonitis ([place])   . Spontaneous bacterial peritonitis ([place]) 09/13/2018  . CAD (coronary artery disease)   . Anemia   . Thrombocytopenia ([place])   . Acute ST elevation  myocardial infarction (STEMI) of posterior wall ([place]) 09/06/2018  . Symptomatic anemia 03/11/2017  . Diet-controlled diabetes mellitus ([place]) 03/11/2017  . NASH (nonalcoholic steatohepatitis) 03/11/2017  . Hypertension 03/11/2017  . Hypothyroidism 03/11/2017  . ESRD on dialysis ([place]) 03/11/2017  . Seizure disorder 03/11/2017  . Abdominal pain 03/11/2017  . HLD (hyperlipidemia) 03/11/2017  . Chronic pancreatitis ([place]) 03/11/2017  . Constipation 03/11/2017  . Laryngopharyngeal reflux (LPR) 02/17/2015  . H/O Mild allergic rhinitis 02/17/2015  . Cirrhosis ([place]) 02/17/2015    Past Surgical History:  Procedure Laterality Date  . ABDOMINAL HYSTERECTOMY    . AV FISTULA PLACEMENT Right 10/30/2016   Procedure: INSERTION OF ARTERIOVENOUS (AV) GORE-TEX GRAFT RIGHT UPPER ARM;  Surgeon: Elam Dutch, MD;  Location: [facility];  Service: Vascular;  Laterality: Right;  . CATARACT EXTRACTION W/ INTRAOCULAR LENS  IMPLANT, BILATERAL Bilateral   . CHOLECYSTECTOMY OPEN    . CORNEAL TRANSPLANT Bilateral   . CORONARY ANGIOGRAPHY N/A 09/06/2018   Procedure: CORONARY ANGIOGRAPHY;  Surgeon: Lorretta Harp, MD;  Location: [facility];  Service: Cardiovascular;  Laterality: N/A;  . CORONARY/GRAFT ACUTE MI REVASCULARIZATION N/A 09/06/2018   Procedure: Coronary/Graft Acute MI Revascularization;  Surgeon: Lorretta Harp, MD;  Location: [facility];  Service: Cardiovascular;  Laterality:  N/A;  . EYE SURGERY    . KNEE ARTHROSCOPY Left   . LEFT HEART CATH AND CORONARY ANGIOGRAPHY N/A 09/06/2018   Procedure: LEFT HEART CATH AND CORONARY ANGIOGRAPHY;  Surgeon: Lorretta Harp, MD;  Location: [place];  Service: Cardiovascular;  Laterality: N/A;  . MOHS SURGERY     "nose"  . SHOULDER OPEN ROTATOR CUFF REPAIR Left   . THROMBECTOMY AND REVISION OF ARTERIOVENTOUS (AV) GORETEX  GRAFT Right 12/04/2016   Procedure: THROMBECTOMY/ REVISION OF RIGHT UPPER ARM ARTERIOVENOUS GORETEX GRAFT;  Surgeon:  Elam Dutch, MD;  Location: [facility];  Service: Vascular;  Laterality: Right;  . TONSILLECTOMY       OB History   No obstetric history on file.     No family history on file.  Social History   Tobacco Use  . Smoking status: Never Smoker  . Smokeless tobacco: Former Systems developer    Types: Snuff  . Tobacco comment: "no snuff since < 1980s"  Vaping Use  . Vaping Use: Never used  Substance Use Topics  . Alcohol use: Never  . Drug use: Never    Home Medications Prior to Admission medications   Medication Sig Start Date End Date Taking? Authorizing Provider  acetaminophen-codeine (TYLENOL #3) 300-30 MG tablet Take 1-2 tablets by mouth every 6 (six) hours as needed for moderate pain.    [provider]  Amino Acids-Protein Hydrolys (FEEDING SUPPLEMENT, PRO-STAT SUGAR FREE 64,) LIQD Take 30 mLs by mouth 2 (two) times daily. 03/16/17   Doreatha Lew, MD  amLODipine (NORVASC) 5 MG tablet Take 1 tablet (5 mg total) by mouth daily. 07/31/19 10/29/19  Troy Sine, MD  aspirin 81 MG chewable tablet Chew 1 tablet (81 mg total) by mouth daily. 09/08/18   Eileen Stanford, PA-C  atorvastatin (LIPITOR) 40 MG tablet TAKE 1 TABLET (40 MG TOTAL) BY MOUTH DAILY AT 6 PM. 01/13/20 02/12/20  Troy Sine, MD  carvedilol (COREG) 3.125 MG tablet TAKE 1 TABLET (3.125 MG TOTAL) BY MOUTH 2 (TWO) TIMES DAILY WITH A MEAL. 02/10/20   Troy Sine, MD  citalopram (CELEXA) 10 MG tablet Take 10 mg by mouth daily with breakfast.  10/15/17   [provider]  clopidogrel (PLAVIX) 75 MG tablet TAKE 1 TABLET (75 MG TOTAL) BY MOUTH DAILY WITH BREAKFAST. 11/04/19   Troy Sine, MD  docusate sodium (COLACE) 100 MG capsule Take 100 mg by mouth daily with lunch.    [provider]  furosemide (LASIX) 40 MG tablet Take 2 tablets (80 mg total) by mouth 2 (two) times daily with breakfast and lunch. 07/31/19   Troy Sine, MD  glipiZIDE (GLUCOTROL) 5 MG tablet Take 5 mg by mouth daily before  breakfast.  06/25/18   [provider]  levETIRAcetam (KEPPRA) 250 MG tablet Take 250 mg by mouth 2 (two) times daily with a meal.     [provider]  levothyroxine (SYNTHROID, LEVOTHROID) 100 MCG tablet Take 100 mcg by mouth daily before breakfast.  09/02/16   [provider]  linagliptin (TRADJENTA) 5 MG TABS tablet Take 5 mg by mouth daily with breakfast.     [provider]  lipase/protease/amylase (CREON) 12000 units CPEP capsule Take 48,000 Units by mouth 3 (three) times daily with meals.    [provider]  midodrine (PROAMATINE) 5 MG tablet Take 5 mg by mouth daily as needed (SBP <110).  Patient not taking: Reported on 01/21/2020 07/30/18  [provider]  multivitamin (PROSIGHT) TABS tablet Take 1 tablet by mouth daily. Patient taking differently: Take 1 tablet by mouth daily with breakfast.  03/17/17   Patrecia Pour, Christean Grief, MD  nitroGLYCERIN (NITROSTAT) 0.4 MG SL tablet Place 0.4 mg under the tongue every 5 (five) minutes as needed for chest pain. Patient not taking: Reported on 01/21/2020    [provider]  Nutritional Supplements (FEEDING SUPPLEMENT, NEPRO CARB STEADY,) LIQD Take 237 mLs by mouth daily. Patient taking differently: Take 237 mLs by mouth daily with lunch.  03/17/17   Patrecia Pour, Christean Grief, MD  ondansetron (ZOFRAN ODT) 8 MG disintegrating tablet Take 1 tablet (8 mg total) by mouth every 8 (eight) hours as needed for nausea or vomiting. 09/22/18   Jola Schmidt, MD  pantoprazole (PROTONIX) 40 MG tablet Take 40 mg by mouth daily with breakfast.  08/21/18   [provider]  polyethylene glycol (MIRALAX / GLYCOLAX) packet Take 17 g by mouth daily.     [provider]  potassium chloride SA (K-DUR,KLOR-CON) 20 MEQ tablet Take 1 tablet (20 mEq total) by mouth 2 (two) times daily. Patient taking differently: Take 20 mEq by mouth daily with breakfast.  11/30/17   Meccariello, Bernita Raisin, DO  prednisoLONE acetate  (PRED FORTE) 1 % ophthalmic suspension Place 1 drop into both eyes at bedtime.     [provider]  sevelamer carbonate (RENVELA) 800 MG tablet Take 2,400 mg by mouth 3 (three) times daily with meals.  07/04/18   [provider]  traZODone (DESYREL) 50 MG tablet Take 50 mg by mouth at bedtime.    [provider]    Allergies    Contrast media [iodinated diagnostic agents] and Tape  Review of Systems   Review of Systems  Constitutional: Positive for chills and fever.  HENT: Negative for congestion, rhinorrhea and sore throat.   Respiratory: Positive for cough and shortness of breath.   Cardiovascular: Positive for leg swelling. Negative for chest pain.  Gastrointestinal: Positive for diarrhea and nausea. Negative for abdominal pain and vomiting.  Musculoskeletal: Negative for neck pain.  Neurological: Positive for light-headedness and headaches.  All other systems reviewed and are negative.   Physical Exam Updated Vital Signs BP (!) 155/57 (BP Location: Right Arm)   Pulse 81   Temp 99.2 F (37.3 C) (Oral)   Resp 16   Ht 5' 1.5" (1.562 m)   Wt 65.8 kg   SpO2 95%   BMI 26.95 kg/m   Physical Exam Vitals reviewed.  Constitutional:      General: She is not in acute distress.    Appearance: Normal appearance.  HENT:     Head: Normocephalic and atraumatic.     Nose: Nose normal.     Mouth/Throat:     Mouth: Mucous membranes are moist.     Pharynx: Oropharynx is clear.  Eyes:     Conjunctiva/sclera: Conjunctivae normal.  Cardiovascular:     Rate and Rhythm: Normal rate.     Heart sounds: Normal heart sounds.  Pulmonary:     Effort: Pulmonary effort is normal. No respiratory distress.     Breath sounds: Rhonchi present.  Abdominal:     General: Abdomen is flat.     Palpations: Abdomen is soft.     Tenderness: There is no abdominal tenderness.     Comments: Peritoneal dialysis site in RLQ, bandage CDI  Musculoskeletal:        General: No  tenderness.  Cervical back: Neck supple.     Right lower leg: Edema present.     Left lower leg: Edema present.  Skin:    General: Skin is warm and dry.     Capillary Refill: Capillary refill takes less than 2 seconds.  Neurological:     Mental Status: She is alert.  Psychiatric:        Mood and Affect: Mood normal.        Behavior: Behavior normal.     ED Results / Procedures / Treatments   Labs (all labs ordered are listed, but only abnormal results are displayed) Labs Reviewed  BASIC METABOLIC PANEL - Abnormal; Notable for the following components:      Result Value   Potassium 3.4 (*)    Chloride 96 (*)    Glucose, Bld 253 (*)    BUN 47 (*)    Creatinine, Ser 5.76 (*)    GFR, Estimated 7 (*)    All other components within normal limits  CBC - Abnormal; Notable for the following components:   WBC 16.9 (*)    RBC 2.74 (*)    Hemoglobin 8.2 (*)    HCT 26.7 (*)    Platelets 89 (*)    All other components within normal limits  TROPONIN I (HIGH SENSITIVITY) - Abnormal; Notable for the following components:   Troponin I (High Sensitivity) 35 (*)    All other components within normal limits  TROPONIN I (HIGH SENSITIVITY) - Abnormal; Notable for the following components:   Troponin I (High Sensitivity) 36 (*)    All other components within normal limits  CULTURE, BLOOD (ROUTINE X 2)  CULTURE, BLOOD (ROUTINE X 2)  RESPIRATORY PANEL BY RT PCR (FLU A&B, COVID)  LACTIC ACID, PLASMA  LACTIC ACID, PLASMA    EKG None  Radiology DG Chest 2 View  Result Date: 03/19/2020 CLINICAL DATA:  Dialysis patient with abnormal chest x-ray, subsequent encounter EXAM: CHEST - 2 VIEW COMPARISON:  Film from earlier in the same day. FINDINGS: Cardiac shadow is within normal limits. Patchy airspace opacity is noted in the left lower lobe, more linear when compared with the prior exam. No other focal infiltrate or effusion is seen. No acute bony abnormality is noted. IMPRESSION: Left lower  lobe infiltrate relatively stable from the prior study. Electronically Signed   By: Inez Catalina M.D.   On: 03/19/2020 15:35    Procedures Procedures (including critical care time)  Medications Ordered in ED Medications  cefTRIAXone (ROCEPHIN) 2 g in sodium chloride 0.9 % 100 mL IVPB (2 g Intravenous New Bag/Given 03/19/20 2246)  azithromycin (ZITHROMAX) 500 mg in sodium chloride 0.9 % 250 mL IVPB (has no administration in time range)    ED Course  I have reviewed the triage vital signs and the nursing notes.  Pertinent labs & imaging results that were available during my care of the patient were reviewed by me and considered in my medical decision making (see chart for details).    MDM Rules/Calculators/A&P                          Medical Decision Making: Samantha Conrad is a 79 y.o. female who presented to the ED today with abnormal CXR (concerning for pneumonia). Pt reports cough, fever, SOB, diarrhea, myalgias, headaches for 1 week.   Past medical history significant for ESRD on peritoneal dialysis, HTN, Stroke, T2DM, Anemia, CAD Reviewed and confirmed nursing documentation for past medical  history, family history, social history.  On my initial exam, the pt was in NAD, normal WOB, dry cough, mild rhonchi in lung bases, 1+ edema in BLLE, RLQ peritoneal dialysis in place.    CXR with Left lower lobe infiltrate.  WBC elevated today 16.9. Hgb stable 8.2.  Clinical picture concerning for pneumonia. No evidence of sepsis at this time.   Blood cultures obtained. Empiric abx for CAP given rocephin and azithromycin.  Pt Troponin 35,36, EKG with sinus rhythm with PACs, 1st degree AV block, and non specific T wave abnormalities, not present when compared to prior EKG.  All radiology and laboratory studies reviewed independently and with my attending physician, agree with reading provided by radiologist unless otherwise noted.   Given pt risk factors (age, renal disease, Glucose, Hct,  BUN, CVA hx) high risk by PORT score, risk class IV, recommend hospitalization for pneumonia tx.   Based on the above findings, I believe patient requires admission.   The above care was discussed with and agreed upon by my attending physician.  Emergency Department Medication Summary:  Medications  cefTRIAXone (ROCEPHIN) 2 g in sodium chloride 0.9 % 100 mL IVPB (2 g Intravenous New Bag/Given 03/19/20 2246)  azithromycin (ZITHROMAX) 500 mg in sodium chloride 0.9 % 250 mL IVPB (has no administration in time range)       Final Clinical Impression(s) / ED Diagnoses Final diagnoses:  Community acquired pneumonia of left lower lobe of lung    Rx / DC Orders ED Discharge Orders    None       Roosevelt Locks, MD 03/19/20 2312    Lajean Saver, MD 03/20/20 1530

## 2020-03-19 NOTE — ED Triage Notes (Signed)
Pt arrives via EMS from home with reports of having a CXR done at [facility] and showing PNA. Pt was told since she was a peritoneal dialysis pt, that she needed to come to [facility] for treatment. Last HD last night. Chest pain only when coughing.   146/60 80 HR 96%RA 313 CBG 99.7

## 2020-03-20 ENCOUNTER — Other Ambulatory Visit: Payer: Self-pay

## 2020-03-20 ENCOUNTER — Encounter (HOSPITAL_COMMUNITY): Payer: Self-pay | Admitting: Internal Medicine

## 2020-03-20 DIAGNOSIS — E1122 Type 2 diabetes mellitus with diabetic chronic kidney disease: Secondary | ICD-10-CM | POA: Diagnosis present

## 2020-03-20 DIAGNOSIS — I69311 Memory deficit following cerebral infarction: Secondary | ICD-10-CM | POA: Diagnosis not present

## 2020-03-20 DIAGNOSIS — J189 Pneumonia, unspecified organism: Secondary | ICD-10-CM | POA: Diagnosis present

## 2020-03-20 DIAGNOSIS — G40909 Epilepsy, unspecified, not intractable, without status epilepticus: Secondary | ICD-10-CM | POA: Diagnosis present

## 2020-03-20 DIAGNOSIS — D696 Thrombocytopenia, unspecified: Secondary | ICD-10-CM | POA: Diagnosis present

## 2020-03-20 DIAGNOSIS — Z66 Do not resuscitate: Secondary | ICD-10-CM | POA: Diagnosis present

## 2020-03-20 DIAGNOSIS — K7581 Nonalcoholic steatohepatitis (NASH): Secondary | ICD-10-CM | POA: Diagnosis present

## 2020-03-20 DIAGNOSIS — F32A Depression, unspecified: Secondary | ICD-10-CM | POA: Diagnosis present

## 2020-03-20 DIAGNOSIS — J690 Pneumonitis due to inhalation of food and vomit: Secondary | ICD-10-CM | POA: Diagnosis present

## 2020-03-20 DIAGNOSIS — K219 Gastro-esophageal reflux disease without esophagitis: Secondary | ICD-10-CM | POA: Diagnosis present

## 2020-03-20 DIAGNOSIS — E860 Dehydration: Secondary | ICD-10-CM | POA: Diagnosis present

## 2020-03-20 DIAGNOSIS — N186 End stage renal disease: Secondary | ICD-10-CM

## 2020-03-20 DIAGNOSIS — I2510 Atherosclerotic heart disease of native coronary artery without angina pectoris: Secondary | ICD-10-CM

## 2020-03-20 DIAGNOSIS — Z992 Dependence on renal dialysis: Secondary | ICD-10-CM

## 2020-03-20 DIAGNOSIS — R569 Unspecified convulsions: Secondary | ICD-10-CM

## 2020-03-20 DIAGNOSIS — Z20822 Contact with and (suspected) exposure to covid-19: Secondary | ICD-10-CM | POA: Diagnosis present

## 2020-03-20 DIAGNOSIS — E039 Hypothyroidism, unspecified: Secondary | ICD-10-CM

## 2020-03-20 DIAGNOSIS — E785 Hyperlipidemia, unspecified: Secondary | ICD-10-CM

## 2020-03-20 DIAGNOSIS — K861 Other chronic pancreatitis: Secondary | ICD-10-CM

## 2020-03-20 DIAGNOSIS — K7460 Unspecified cirrhosis of liver: Secondary | ICD-10-CM | POA: Diagnosis present

## 2020-03-20 DIAGNOSIS — A419 Sepsis, unspecified organism: Secondary | ICD-10-CM | POA: Diagnosis present

## 2020-03-20 DIAGNOSIS — D631 Anemia in chronic kidney disease: Secondary | ICD-10-CM | POA: Diagnosis present

## 2020-03-20 DIAGNOSIS — E119 Type 2 diabetes mellitus without complications: Secondary | ICD-10-CM

## 2020-03-20 DIAGNOSIS — R0603 Acute respiratory distress: Secondary | ICD-10-CM | POA: Diagnosis present

## 2020-03-20 DIAGNOSIS — I69312 Visuospatial deficit and spatial neglect following cerebral infarction: Secondary | ICD-10-CM | POA: Diagnosis not present

## 2020-03-20 DIAGNOSIS — R1313 Dysphagia, pharyngeal phase: Secondary | ICD-10-CM | POA: Diagnosis present

## 2020-03-20 DIAGNOSIS — E876 Hypokalemia: Secondary | ICD-10-CM | POA: Diagnosis present

## 2020-03-20 LAB — RENAL FUNCTION PANEL
Albumin: 2.3 g/dL — ABNORMAL LOW (ref 3.5–5.0)
Anion gap: 14 (ref 5–15)
BUN: 56 mg/dL — ABNORMAL HIGH (ref 8–23)
CO2: 24 mmol/L (ref 22–32)
Calcium: 8.9 mg/dL (ref 8.9–10.3)
Chloride: 99 mmol/L (ref 98–111)
Creatinine, Ser: 5.99 mg/dL — ABNORMAL HIGH (ref 0.44–1.00)
GFR, Estimated: 7 mL/min — ABNORMAL LOW (ref >60)
Glucose, Bld: 120 mg/dL — ABNORMAL HIGH (ref 70–99)
Phosphorus: 2.4 mg/dL — ABNORMAL LOW (ref 2.5–4.6)
Potassium: 3.2 mmol/L — ABNORMAL LOW (ref 3.5–5.1)
Sodium: 137 mmol/L (ref 135–145)

## 2020-03-20 LAB — CBC
HCT: 25.6 % — ABNORMAL LOW (ref 36.0–46.0)
Hemoglobin: 7.8 g/dL — ABNORMAL LOW (ref 12.0–15.0)
MCH: 29.9 pg (ref 26.0–34.0)
MCHC: 30.5 g/dL (ref 30.0–36.0)
MCV: 98.1 fL (ref 80.0–100.0)
Platelets: 72 10*3/uL — ABNORMAL LOW (ref 150–400)
RBC: 2.61 MIL/uL — ABNORMAL LOW (ref 3.87–5.11)
RDW: 15.3 % (ref 11.5–15.5)
WBC: 12.5 10*3/uL — ABNORMAL HIGH (ref 4.0–10.5)
nRBC: 0 % (ref 0.0–0.2)

## 2020-03-20 LAB — RESPIRATORY PANEL BY RT PCR (FLU A&B, COVID)
Influenza A by PCR: NEGATIVE
Influenza B by PCR: NEGATIVE
SARS Coronavirus 2 by RT PCR: NEGATIVE

## 2020-03-20 MED ORDER — TORSEMIDE 100 MG PO TABS
100.0000 mg | ORAL_TABLET | Freq: Every day | ORAL | Status: DC
  Administered 2020-03-21 – 2020-03-22 (×2): 100 mg via ORAL
  Filled 2020-03-20 (×2): qty 1

## 2020-03-20 MED ORDER — CARVEDILOL 3.125 MG PO TABS
3.1250 mg | ORAL_TABLET | Freq: Two times a day (BID) | ORAL | Status: DC
  Administered 2020-03-20 – 2020-03-22 (×4): 3.125 mg via ORAL
  Filled 2020-03-20 (×5): qty 1

## 2020-03-20 MED ORDER — DELFLEX-LC/2.5% DEXTROSE 394 MOSM/L IP SOLN: INTRAPERITONEAL | Status: DC

## 2020-03-20 MED ORDER — GENTAMICIN SULFATE 0.1 % EX CREA
1.0000 "application " | TOPICAL_CREAM | Freq: Every day | CUTANEOUS | Status: DC
  Administered 2020-03-21: 1 via TOPICAL
  Filled 2020-03-20: qty 15

## 2020-03-20 MED ORDER — PANTOPRAZOLE SODIUM 40 MG PO TBEC
40.0000 mg | DELAYED_RELEASE_TABLET | Freq: Every day | ORAL | Status: DC
  Administered 2020-03-20 – 2020-03-22 (×3): 40 mg via ORAL
  Filled 2020-03-20 (×3): qty 1

## 2020-03-20 MED ORDER — CITALOPRAM HYDROBROMIDE 20 MG PO TABS
10.0000 mg | ORAL_TABLET | Freq: Every day | ORAL | Status: DC
  Administered 2020-03-20 – 2020-03-22 (×3): 10 mg via ORAL
  Filled 2020-03-20 (×3): qty 1

## 2020-03-20 MED ORDER — PROSOURCE PLUS PO LIQD
30.0000 mL | Freq: Two times a day (BID) | ORAL | Status: DC
  Administered 2020-03-20 – 2020-03-22 (×2): 30 mL via ORAL
  Filled 2020-03-20 (×6): qty 30

## 2020-03-20 MED ORDER — SODIUM CHLORIDE 0.9 % IV SOLN
2.0000 g | INTRAVENOUS | Status: DC
  Administered 2020-03-20 – 2020-03-21 (×2): 2 g via INTRAVENOUS
  Filled 2020-03-20 (×2): qty 20

## 2020-03-20 MED ORDER — ACETAMINOPHEN 650 MG RE SUPP: 650.0000 mg | Freq: Four times a day (QID) | RECTAL | Status: DC | PRN

## 2020-03-20 MED ORDER — SEVELAMER CARBONATE 800 MG PO TABS
2400.0000 mg | ORAL_TABLET | Freq: Three times a day (TID) | ORAL | Status: DC
  Filled 2020-03-20: qty 3

## 2020-03-20 MED ORDER — GUAIFENESIN ER 600 MG PO TB12
600.0000 mg | ORAL_TABLET | Freq: Two times a day (BID) | ORAL | Status: DC | PRN
  Administered 2020-03-21 (×2): 600 mg via ORAL
  Filled 2020-03-20 (×2): qty 1

## 2020-03-20 MED ORDER — POLYETHYLENE GLYCOL 3350 17 G PO PACK: 17.0000 g | PACK | Freq: Every day | ORAL | Status: DC | PRN

## 2020-03-20 MED ORDER — SODIUM CHLORIDE 0.9% FLUSH
3.0000 mL | Freq: Two times a day (BID) | INTRAVENOUS | Status: DC
  Administered 2020-03-20 – 2020-03-22 (×3): 3 mL via INTRAVENOUS

## 2020-03-20 MED ORDER — LEVOTHYROXINE SODIUM 100 MCG PO TABS
100.0000 ug | ORAL_TABLET | Freq: Every day | ORAL | Status: DC
  Administered 2020-03-20 – 2020-03-22 (×3): 100 ug via ORAL
  Filled 2020-03-20 (×3): qty 1

## 2020-03-20 MED ORDER — GLIPIZIDE 5 MG PO TABS
5.0000 mg | ORAL_TABLET | Freq: Every day | ORAL | Status: DC
  Administered 2020-03-21 – 2020-03-22 (×2): 5 mg via ORAL
  Filled 2020-03-20 (×3): qty 1

## 2020-03-20 MED ORDER — POTASSIUM CHLORIDE CRYS ER 20 MEQ PO TBCR
20.0000 meq | EXTENDED_RELEASE_TABLET | Freq: Two times a day (BID) | ORAL | Status: DC
  Administered 2020-03-20: 20 meq via ORAL
  Filled 2020-03-20: qty 1

## 2020-03-20 MED ORDER — SODIUM CHLORIDE 0.9 % IV SOLN
500.0000 mg | INTRAVENOUS | Status: DC
  Administered 2020-03-20 – 2020-03-21 (×2): 500 mg via INTRAVENOUS
  Filled 2020-03-20 (×4): qty 500

## 2020-03-20 MED ORDER — SEVELAMER CARBONATE 800 MG PO TABS
800.0000 mg | ORAL_TABLET | Freq: Three times a day (TID) | ORAL | Status: DC
  Administered 2020-03-20 – 2020-03-22 (×6): 800 mg via ORAL
  Filled 2020-03-20 (×8): qty 1

## 2020-03-20 MED ORDER — NEPRO/CARBSTEADY PO LIQD: 237.0000 mL | Freq: Every day | ORAL | Status: DC

## 2020-03-20 MED ORDER — ENOXAPARIN SODIUM 30 MG/0.3ML ~~LOC~~ SOLN
30.0000 mg | SUBCUTANEOUS | Status: DC
  Administered 2020-03-20 – 2020-03-21 (×2): 30 mg via SUBCUTANEOUS
  Filled 2020-03-20 (×2): qty 0.3

## 2020-03-20 MED ORDER — HEPARIN 1000 UNIT/ML FOR PERITONEAL DIALYSIS
500.0000 [IU] | INTRAMUSCULAR | Status: DC | PRN
  Filled 2020-03-20: qty 0.5

## 2020-03-20 MED ORDER — ATORVASTATIN CALCIUM 40 MG PO TABS
40.0000 mg | ORAL_TABLET | Freq: Every day | ORAL | Status: DC
  Administered 2020-03-20 – 2020-03-21 (×2): 40 mg via ORAL
  Filled 2020-03-20 (×3): qty 1

## 2020-03-20 MED ORDER — DOCUSATE SODIUM 100 MG PO CAPS
100.0000 mg | ORAL_CAPSULE | Freq: Every day | ORAL | Status: DC
  Administered 2020-03-21: 100 mg via ORAL
  Filled 2020-03-20 (×2): qty 1

## 2020-03-20 MED ORDER — ONDANSETRON 4 MG PO TBDP: 8.0000 mg | ORAL_TABLET | Freq: Three times a day (TID) | ORAL | Status: DC | PRN

## 2020-03-20 MED ORDER — ASPIRIN 81 MG PO CHEW
81.0000 mg | CHEWABLE_TABLET | Freq: Every day | ORAL | Status: DC
  Administered 2020-03-20 – 2020-03-22 (×3): 81 mg via ORAL
  Filled 2020-03-20 (×3): qty 1

## 2020-03-20 MED ORDER — CLOPIDOGREL BISULFATE 75 MG PO TABS
75.0000 mg | ORAL_TABLET | Freq: Every day | ORAL | Status: DC
  Administered 2020-03-20 – 2020-03-22 (×3): 75 mg via ORAL
  Filled 2020-03-20 (×3): qty 1

## 2020-03-20 MED ORDER — LINAGLIPTIN 5 MG PO TABS
5.0000 mg | ORAL_TABLET | Freq: Every day | ORAL | Status: DC
  Administered 2020-03-21 – 2020-03-22 (×2): 5 mg via ORAL
  Filled 2020-03-20 (×2): qty 1

## 2020-03-20 MED ORDER — PANCRELIPASE (LIP-PROT-AMYL) 36000-114000 UNITS PO CPEP
48000.0000 [IU] | ORAL_CAPSULE | Freq: Three times a day (TID) | ORAL | Status: DC
  Administered 2020-03-20 – 2020-03-22 (×6): 48000 [IU] via ORAL
  Filled 2020-03-20 (×10): qty 1

## 2020-03-20 MED ORDER — PROSIGHT PO TABS
1.0000 | ORAL_TABLET | Freq: Every day | ORAL | Status: DC
  Administered 2020-03-20 – 2020-03-22 (×3): 1 via ORAL
  Filled 2020-03-20 (×3): qty 1

## 2020-03-20 MED ORDER — ACETAMINOPHEN 325 MG PO TABS
650.0000 mg | ORAL_TABLET | Freq: Four times a day (QID) | ORAL | Status: DC | PRN
  Administered 2020-03-21: 650 mg via ORAL
  Filled 2020-03-20: qty 2

## 2020-03-20 MED ORDER — LEVETIRACETAM 250 MG PO TABS
250.0000 mg | ORAL_TABLET | Freq: Two times a day (BID) | ORAL | Status: DC
  Administered 2020-03-20 – 2020-03-22 (×5): 250 mg via ORAL
  Filled 2020-03-20 (×8): qty 1

## 2020-03-20 NOTE — ED Notes (Signed)
Breakfast ordered 

## 2020-03-20 NOTE — ED Notes (Signed)
Was giving report to Jena, on 5N, he will check with charge nurse to find out if pt appropriate for floor.

## 2020-03-20 NOTE — Consult Note (Signed)
[facility] Renal Consultation Note    Indication for Consultation:  Management of ESRD/hemodialysis; anemia, hypertension/volume and secondary hyperparathyroidism  HPI: Samantha Conrad is a 79 y.o. 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  She had the gradual onset of symptoms of cough x 1 week, fever, chills and SOB. She was transferred to [facility] due to the need for peritoneal dialysis.  Initial labs in ED showed Na 135 K 3.4 glu 252 BUN 46 Cr 5.76 Ca 9.1 WBC 16.9 hgb 8.2 plts 89.  Repeat labs today K 3.2 WBC 12.5 hgb 7.6 and platelets 72. CXR showed LLL infiltrate. VSS. BC drawn. Emp eric ant biotics ordered.  Feeling better this am. Michela Pitcher initial sputum had been white then changed to orangish color but denied blood tinged.Lives in an apartment very near her daughter.  Son fills pill box. Has excellent home support. Daughter helps with dialysis as well.   Past Medical History:  Diagnosis Date  . Anemia   . Basal cell carcinoma     on nose/face  . CAD (coronary artery disease)    a. 08/2018: posterior STEMI s/p DES to LCx x2  . Depression   . ESRD on peritoneal dialysis ([place])    "q night" (11/29/2017)  . Fatty liver   . Hypertension   . Hypothyroidism   . NASH (nonalcoholic steatohepatitis)   . Seizures ([place])    ? when she may have had a stroke sometime in the spring of 2018  . Sleep apnea    diagnosed years ago "after I'd gained alot of weight;  no longer a problem since weight loss"  . Stroke [facility]) 04/2016   memory issues, lost 1/2 vision in both eyes  . Thrombocytopenia ([place])   . Thyroid disease   . Type II diabetes mellitus ([place])    Past Surgical History:  Procedure Laterality Date  . ABDOMINAL HYSTERECTOMY    . AV FISTULA PLACEMENT  Right 10/30/2016   Procedure: INSERTION OF ARTERIOVENOUS (AV) GORE-TEX GRAFT RIGHT UPPER ARM;  Surgeon: Elam Dutch, MD;  Location: [place];  Service: Vascular;  Laterality: Right;  . CATARACT EXTRACTION W/ INTRAOCULAR LENS  IMPLANT, BILATERAL Bilateral   . CHOLECYSTECTOMY OPEN    . CORNEAL TRANSPLANT Bilateral   . CORONARY ANGIOGRAPHY N/A 09/06/2018   Procedure: CORONARY ANGIOGRAPHY;  Surgeon: Lorretta Harp, MD;  Location: [place] CV LAB;  Service: Cardiovascular;  Laterality: N/A;  . CORONARY/GRAFT ACUTE MI REVASCULARIZATION N/A 09/06/2018   Procedure: Coronary/Graft Acute MI Revascularization;  Surgeon: Lorretta Harp, MD;  Location: [place] CV LAB;  Service: Cardiovascular;  Laterality: N/A;  . EYE SURGERY    . KNEE ARTHROSCOPY Left   . LEFT HEART CATH AND CORONARY ANGIOGRAPHY N/A 09/06/2018   Procedure: LEFT HEART CATH AND CORONARY ANGIOGRAPHY;  Surgeon: Lorretta Harp, MD;  Location: [place] CV LAB;  Service: Cardiovascular;  Laterality: N/A;  . MOHS SURGERY     "nose"  . SHOULDER OPEN ROTATOR CUFF REPAIR Left   . THROMBECTOMY AND REVISION OF ARTERIOVENTOUS (AV) GORETEX  GRAFT Right 12/04/2016   Procedure: THROMBECTOMY/ REVISION OF RIGHT UPPER ARM ARTERIOVENOUS GORETEX GRAFT;  Surgeon: Elam Dutch, MD;  Location: [place];  Service: Vascular;  Laterality: Right;  . TONSILLECTOMY  Family History  Problem Relation Age of Onset  . Tuberculosis Mother    Social History:  reports that she has never smoked. She has quit using smokeless tobacco.  Her smokeless tobacco use included snuff. She reports that she does not drink alcohol and does not use drugs. Allergies  Allergen Reactions  . Contrast Media [Iodinated Diagnostic Agents] Swelling    SWELLING REACTION UNSPECIFIED   . Tape Other (See Comments) and Dermatitis    Tears skin - please use paper tape Paper tape only   Prior to Admission medications   Medication Sig Start Date End Date Taking? Authorizing  Provider  acetaminophen-codeine (TYLENOL #3) 300-30 MG tablet Take 1-2 tablets by mouth every 6 (six) hours as needed for moderate pain.   Yes [provider]  Amino Acids-Protein Hydrolys (FEEDING SUPPLEMENT, PRO-STAT SUGAR FREE 64,) LIQD Take 30 mLs by mouth 2 (two) times daily. 03/16/17  Yes Patrecia Pour, Christean Grief, MD  amLODipine (NORVASC) 5 MG tablet Take 1 tablet (5 mg total) by mouth daily. Patient taking differently: Take 5 mg by mouth at bedtime.  07/31/19 03/20/20 Yes Troy Sine, MD  aspirin 81 MG chewable tablet Chew 1 tablet (81 mg total) by mouth daily. 09/08/18  Yes Eileen Stanford, PA-C  atorvastatin (LIPITOR) 40 MG tablet TAKE 1 TABLET (40 MG TOTAL) BY MOUTH DAILY AT 6 PM. 01/13/20 03/20/20 Yes Troy Sine, MD  carvedilol (COREG) 3.125 MG tablet TAKE 1 TABLET (3.125 MG TOTAL) BY MOUTH 2 (TWO) TIMES DAILY WITH A MEAL. 02/10/20  Yes Troy Sine, MD  citalopram (CELEXA) 10 MG tablet Take 10 mg by mouth daily with breakfast.  10/15/17  Yes [provider]  clopidogrel (PLAVIX) 75 MG tablet TAKE 1 TABLET (75 MG TOTAL) BY MOUTH DAILY WITH BREAKFAST. 11/04/19  Yes Troy Sine, MD  docusate sodium (COLACE) 100 MG capsule Take 100 mg by mouth daily with lunch.   Yes [provider]  gentamicin cream (GARAMYCIN) 0.1 % Apply 1 application topically daily as needed. 03/02/20  Yes [provider]  glipiZIDE (GLUCOTROL) 5 MG tablet Take 5 mg by mouth 2 (two) times daily before a meal.  06/25/18  Yes [provider]  lactulose, encephalopathy, (CHRONULAC) 10 GM/15ML SOLN Take 30 g by mouth once.   Yes [provider]  levETIRAcetam (KEPPRA) 250 MG tablet Take 250 mg by mouth 2 (two) times daily with a meal.    Yes [provider]  levothyroxine (SYNTHROID, LEVOTHROID) 100 MCG tablet Take 100 mcg by mouth daily before breakfast.  09/02/16  Yes [provider]  linagliptin (TRADJENTA) 5 MG TABS tablet Take 5 mg by mouth daily  with breakfast.    Yes [provider]  lipase/protease/amylase (CREON) 12000 units CPEP capsule Take 48,000 Units by mouth 3 (three) times daily with meals.   Yes [provider]  Methoxy PEG-Epoetin Beta (MIRCERA IJ) Inject into the skin. 03/09/20  Yes [provider]  multivitamin (PROSIGHT) TABS tablet Take 1 tablet by mouth daily. Patient taking differently: Take 1 tablet by mouth daily with breakfast.  03/17/17  Yes Patrecia Pour, Christean Grief, MD  Nutritional Supplements (FEEDING SUPPLEMENT, NEPRO CARB STEADY,) LIQD Take 237 mLs by mouth daily. Patient taking differently: Take 237 mLs by mouth daily with lunch.  03/17/17  Yes Patrecia Pour, Christean Grief, MD  pantoprazole (PROTONIX) 40 MG tablet Take 40 mg by mouth daily with breakfast.  08/21/18  Yes [provider]  polyethylene glycol (MIRALAX / GLYCOLAX)  packet Take 17 g by mouth daily.    Yes [provider]  potassium chloride SA (K-DUR,KLOR-CON) 20 MEQ tablet Take 1 tablet (20 mEq total) by mouth 2 (two) times daily. Patient taking differently: Take 20 mEq by mouth daily with breakfast.  11/30/17  Yes Meccariello, Bernita Raisin, DO  prednisoLONE acetate (PRED FORTE) 1 % ophthalmic suspension Place 1 drop into both eyes at bedtime.    Yes [provider]  sevelamer carbonate (RENVELA) 800 MG tablet Take 800 mg by mouth 3 (three) times daily with meals.  07/04/18  Yes [provider]  torsemide (DEMADEX) 100 MG tablet Take 100 mg by mouth daily. 03/17/20  Yes [provider]  traZODone (DESYREL) 50 MG tablet Take 50 mg by mouth at bedtime.   Yes [provider]  nitroGLYCERIN (NITROSTAT) 0.4 MG SL tablet Place 0.4 mg under the tongue every 5 (five) minutes as needed for chest pain.     [provider]   Current Facility-Administered Medications  Medication Dose Route Frequency Provider Last Rate Last Admin  . (feeding supplement) PROSource Plus liquid 30 mL  30 mL Oral BID  Marcelyn Bruins, MD      . acetaminophen (TYLENOL) tablet 650 mg  650 mg Oral Q6H PRN Marcelyn Bruins, MD       Or  . acetaminophen (TYLENOL) suppository 650 mg  650 mg Rectal Q6H PRN Marcelyn Bruins, MD      . aspirin chewable tablet 81 mg  81 mg Oral Daily Marcelyn Bruins, MD   81 mg at 03/20/20 1020  . atorvastatin (LIPITOR) tablet 40 mg  40 mg Oral q1800 Marcelyn Bruins, MD      . azithromycin [facility]) 500 mg in sodium chloride 0.9 % 250 mL IVPB  500 mg Intravenous Q24H Marcelyn Bruins, MD      . carvedilol (COREG) tablet 3.125 mg  3.125 mg Oral BID WC Marcelyn Bruins, MD      . cefTRIAXone (ROCEPHIN) 2 g in sodium chloride 0.9 % 100 mL IVPB  2 g Intravenous Q24H Marcelyn Bruins, MD      . citalopram (CELEXA) tablet 10 mg  10 mg Oral Q breakfast Marcelyn Bruins, MD   10 mg at 03/20/20 1018  . clopidogrel (PLAVIX) tablet 75 mg  75 mg Oral Q breakfast Marcelyn Bruins, MD   75 mg at 03/20/20 1019  . docusate sodium (COLACE) capsule 100 mg  100 mg Oral Q lunch Marcelyn Bruins, MD      . enoxaparin (LOVENOX) injection 30 mg  30 mg Subcutaneous Q24H Marcelyn Bruins, MD   30 mg at 03/20/20 1022  . feeding supplement (NEPRO CARB STEADY) liquid 237 mL  237 mL Oral Q lunch Marcelyn Bruins, MD      . glipiZIDE (GLUCOTROL) tablet 5 mg  5 mg Oral QAC breakfast Marcelyn Bruins, MD      . guaiFENesin [facility]) 12 hr tablet 600 mg  600 mg Oral BID PRN Marcelyn Bruins, MD      . levETIRAcetam (KEPPRA) tablet 250 mg  250 mg Oral BID WC Marcelyn Bruins, MD   250 mg at 03/20/20 0955  . levothyroxine (SYNTHROID) tablet 100 mcg  100 mcg Oral QAC breakfast Marcelyn Bruins, MD   100 mcg at 03/20/20 1018  . linagliptin (TRADJENTA) tablet 5 mg  5 mg Oral Q breakfast Marcelyn Bruins, MD      .  lipase/protease/amylase (CREON) capsule 48,000 Units  48,000 Units Oral TID WC Marcelyn Bruins, MD   48,000 Units at 03/20/20 [phone]  . multivitamin  (PROSIGHT) tablet 1 tablet  1 tablet Oral Q breakfast Marcelyn Bruins, MD   1 tablet at 03/20/20 [phone]  . ondansetron (ZOFRAN-ODT) disintegrating tablet 8 mg  8 mg Oral Q8H PRN Marcelyn Bruins, MD      . pantoprazole (PROTONIX) EC tablet 40 mg  40 mg Oral Q breakfast Marcelyn Bruins, MD   40 mg at 03/20/20 1018  . polyethylene glycol (MIRALAX / GLYCOLAX) packet 17 g  17 g Oral Daily PRN Marcelyn Bruins, MD      . sevelamer carbonate (RENVELA) tablet 2,400 mg  2,400 mg Oral TID WC Marcelyn Bruins, MD      . sodium chloride flush (NS) 0.9 % injection 3 mL  3 mL Intravenous Q12H Marcelyn Bruins, MD       Current Outpatient Medications  Medication Sig Dispense Refill  . acetaminophen-codeine (TYLENOL #3) 300-30 MG tablet Take 1-2 tablets by mouth every 6 (six) hours as needed for moderate pain.    . Amino Acids-Protein Hydrolys (FEEDING SUPPLEMENT, PRO-STAT SUGAR FREE 64,) LIQD Take 30 mLs by mouth 2 (two) times daily. 900 mL 0  . amLODipine (NORVASC) 5 MG tablet Take 1 tablet (5 mg total) by mouth daily. (Patient taking differently: Take 5 mg by mouth at bedtime. ) 90 tablet 3  . aspirin 81 MG chewable tablet Chew 1 tablet (81 mg total) by mouth daily.    Marland Kitchen atorvastatin (LIPITOR) 40 MG tablet TAKE 1 TABLET (40 MG TOTAL) BY MOUTH DAILY AT 6 PM. 90 tablet 2  . carvedilol (COREG) 3.125 MG tablet TAKE 1 TABLET (3.125 MG TOTAL) BY MOUTH 2 (TWO) TIMES DAILY WITH A MEAL. 270 tablet 1  . citalopram (CELEXA) 10 MG tablet Take 10 mg by mouth daily with breakfast.   1  . clopidogrel (PLAVIX) 75 MG tablet TAKE 1 TABLET (75 MG TOTAL) BY MOUTH DAILY WITH BREAKFAST. 90 tablet 3  . docusate sodium (COLACE) 100 MG capsule Take 100 mg by mouth daily with lunch.    Marland Kitchen gentamicin cream (GARAMYCIN) 0.1 % Apply 1 application topically daily as needed.    Marland Kitchen glipiZIDE (GLUCOTROL) 5 MG tablet Take 5 mg by mouth 2 (two) times daily before a meal.     . lactulose, encephalopathy, (CHRONULAC) 10 GM/15ML  SOLN Take 30 g by mouth once.    . levETIRAcetam (KEPPRA) 250 MG tablet Take 250 mg by mouth 2 (two) times daily with a meal.     . levothyroxine (SYNTHROID, LEVOTHROID) 100 MCG tablet Take 100 mcg by mouth daily before breakfast.   0  . linagliptin (TRADJENTA) 5 MG TABS tablet Take 5 mg by mouth daily with breakfast.     . lipase/protease/amylase (CREON) 12000 units CPEP capsule Take 48,000 Units by mouth 3 (three) times daily with meals.    . Methoxy PEG-Epoetin Beta (MIRCERA IJ) Inject into the skin.    . multivitamin (PROSIGHT) TABS tablet Take 1 tablet by mouth daily. (Patient taking differently: Take 1 tablet by mouth daily with breakfast. ) 30 each 0  . Nutritional Supplements (FEEDING SUPPLEMENT, NEPRO CARB STEADY,) LIQD Take 237 mLs by mouth daily. (Patient taking differently: Take 237 mLs by mouth daily with lunch. )  0  . pantoprazole (PROTONIX) 40 MG tablet Take 40 mg by mouth daily with breakfast.     .  polyethylene glycol (MIRALAX / GLYCOLAX) packet Take 17 g by mouth daily.     . potassium chloride SA (K-DUR,KLOR-CON) 20 MEQ tablet Take 1 tablet (20 mEq total) by mouth 2 (two) times daily. (Patient taking differently: Take 20 mEq by mouth daily with breakfast. ) 60 tablet 0  . prednisoLONE acetate (PRED FORTE) 1 % ophthalmic suspension Place 1 drop into both eyes at bedtime.     . sevelamer carbonate (RENVELA) 800 MG tablet Take 800 mg by mouth 3 (three) times daily with meals.     . torsemide (DEMADEX) 100 MG tablet Take 100 mg by mouth daily.    . traZODone (DESYREL) 50 MG tablet Take 50 mg by mouth at bedtime.    . nitroGLYCERIN (NITROSTAT) 0.4 MG SL tablet Place 0.4 mg under the tongue every 5 (five) minutes as needed for chest pain.      Labs: Basic Metabolic Panel: Recent Labs  Lab 03/19/20 1509 03/20/20 0323  NA 135 137  K 3.4* 3.2*  CL 96* 99  CO2 25 24  GLUCOSE 253* 120*  BUN 47* 56*  CREATININE 5.76* 5.99*  CALCIUM 9.1 8.9  PHOS  --  2.4*   Liver Function  Tests: Recent Labs  Lab 03/20/20 0323  ALBUMIN 2.3*  CBC: Recent Labs  Lab 03/19/20 1509 03/20/20 0323  WBC 16.9* 12.5*  HGB 8.2* 7.8*  HCT 26.7* 25.6*  MCV 97.4 98.1  PLT 89* 72*   Studies/Results: DG Chest 2 View  Result Date: 03/19/2020 CLINICAL DATA:  Dialysis patient with abnormal chest x-ray, subsequent encounter EXAM: CHEST - 2 VIEW COMPARISON:  Film from earlier in the same day. FINDINGS: Cardiac shadow is within normal limits. Patchy airspace opacity is noted in the left lower lobe, more linear when compared with the prior exam. No other focal infiltrate or effusion is seen. No acute bony abnormality is noted. IMPRESSION: Left lower lobe infiltrate relatively stable from the prior study. Electronically Signed   By: Inez Catalina M.D.   On: 03/19/2020 15:35    ROS: As per HPI otherwise negative.  Physical Exam: Vitals:   03/20/20 0900 03/20/20 0930 03/20/20 1000 03/20/20 1015  BP: (!) 109/37  (!) 144/51   Pulse: 80 76 81 78  Resp: 16 16 18 20   Temp:      TempSrc:      SpO2: 92% 92% 91% 93%  Weight:      Height:         General:  Alert elderly female breathing easily on room air. Coughs with deep inspiration. Head: NCAT sclera not icteric MMM Neck: Supple.  Lungs:   Dim BS LLQ otherwise clear. Breathing is unlabored. Heart: RRR with S1 S2.  Abdomen: soft NT + BS Lower extremities: +  edema or ischemic changes, no open wounds  Neuro: A & O  X 3. Moves all extremities spontaneously. Psych:  Responds to questions appropriately with a normal affect. Dialysis Access: PD catheter RUQ  Dialysis Orders:  Followed by Dr. Johnney Ou CCPD- 7 days a week , EDW 63.5 kg, 2.5 calcium/0.5 magnesium dialysate with variable dextrose.  4 exchanges, 2250 L fill volume, 90-minute dwell time, last fill volume 0 cc, 0 daytime exchanges, 0 daytime dwell. Usually uses 2.5s - occ 4.25  Recent labs hgb 8.4 - last MIrcera 200 10/22, 150 10/12 and 100 9.23 most recent tsat 29%  Baseline plts  60 - 80 K Meds: ^ glipizide 5 bid and torsemide 100 q am on 10/19 - family  does pill box - she had a list of meds here that reflect this. sevelemer 1 ac  Assessment/Plan: 1. LLL PNA - cultured - emperic antibiotics per primary - COVID neg 2. ESRD -  CCPD - deferred last night - no acute need - plan routine orders this evening, resume torsemide 3. Hypokalemia - will not restrict diet - prone to low K at home K 3.2 - give K supp 20 bid today -takes 20 per day routinely 4. Hypertension/volume  - on coreg 3.125 bid and amlodipine 5 at HS - amlodipine not ordered here - hold for now -  5. Anemia  - hgb same range as outpatient - last Mircera 10/22- Fe adeqaute 6. Metabolic bone disease -  Reduce renvela to 1 ac - off calcitriol at present 7. Nutrition - changed to carb mod heart healthy to increase K offerings in diet/prosource/vits 8. Pancreatic insufficency - on creon 9. Hypothyroidism - on synthroid 10. CAD hx STEMI/ prior PCI 2020 11. Chronic thrombocytopenia - plts stable  Myriam Jacobson, PA-C [place] [phone] 03/20/2020, 10:59 AM

## 2020-03-20 NOTE — Progress Notes (Signed)
PROGRESS NOTE    Samantha Conrad  HDQ:[id number] DOB: 1940/11/17 DOA: 03/19/2020 PCP: [facility]   Brief Narrative:   Samantha Conrad is a 79 y.o. female with medical history significant of CAD status post STEMI in 2020 and subsequent DES x2, chronic pancreatitis, diabetes, ESRD on peritoneal dialysis, hyperlipidemia, hypothyroidism, LPR, seizure, thrombocytopenia who presents with fever and cough.  Patient states she has had 1 week of cough and intermittent fever as high as 103.  The home health individual who came to do her peritoneal dialysis today told the patient that she was concerned about the fever and recommend that she go to the ED.  Patient was seen in the [facility] ED where she had a chest x-ray done which showed pneumonia, however the patient was instructed to come to [facility] as they would be unable to assist her with peritoneal dialysis if admitted there.  Patient denies shortness of breath, chest pain.  Reports some mild abdominal discomfort since arriving in our ED. In ED: labs showed hypokalemia at 3.4 and creatinine elevated to 5.76 consistent with ESRD.  Glucose noted to be elevated to 53.  Hemoglobin 8.2 which is stable for her, platelets 89 which is mildly reduced from her baseline thrombocytopenia of 110, and leukocytosis to 16.  Troponins were flat with initial troponin of 35 and repeat 36.  Lactic acid, blood cultures, respiratory panel were ordered but not back at the time of admission.  EKG showed sinus rhythm with first-degree AV block, PVCs.  Chest x-ray showed left lower lobe infiltrate.  Patient received ceftriaxone and azithromycin in the ED -hospitalist called for admission.   Assessment & Plan:   Active Problems:   Laryngopharyngeal reflux (LPR)   Diet-controlled diabetes mellitus (HCC)   Hypothyroidism   ESRD on dialysis ([place])   Seizure disorder   HLD (hyperlipidemia)   Chronic pancreatitis (HCC)   CAD (coronary artery disease)    Anemia   Thrombocytopenia (HCC)   PNA (pneumonia)   Acute likely recurrent aspiration versus community-acquired pneumonia, POA Sepsis in the setting of pneumonia, POA -Patient has notable leukocytosis, tachypneic on evaluation into the 20s with notable pneumonia on imaging meeting sepsis criteria -Patient indicates weeks if not months of dysphagia and difficulty swallowing with moderate frequency, does not recall being on any special diet at home but does get "choked" on food quite frequently, we will have speech evaluate. - Continue ceftriaxone and azithromycin,  continue to follow clinically - Continue supportive care, incentive spirometry, flutter -Patient not currently hypoxic however she is borderline at rest in the low 90s, follow ambulatory oxygen screening for possible hypoxia in the setting of likely recurrent aspiration pneumonia  CAD status post STEMI in 2020 and subsequent DES x2 - Continue home Coreg and Lipitor  Chronic pancreatitis - Continue home Creon and as needed Zofran  Diabetes - Currently diet controlled - Monitor CBGs  ESRD on peritoneal dialysis > Nephrology consult admission will see patient in the morning - Follow renal function - Avoid nephrotoxic agents  Hyperlipidemia Continue home Lipitor  Hypothyroidism -Continue home Synthroid  LPR -Continue home PPI  Seizure - Continue home Keppra  Anemia Thrombocytopenia > Chronic with baseline Hgb 8 and baseline PLTs. > HPG 8.2 and PLT 89 on admission - Follow CBC  Hypertension versus hypotension  - In the chart and patient has history of medication for both hypertension and hypotension -on both antihypertensives and midodrine -currently being held.  DVT prophylaxis:  Lovenox Code Status:              DNR Family Communication: None present  Status is: Transition to inpatient  Dispo: The patient is from: Home              Anticipated d/c is to: To be determined               Anticipated d/c date is: 40 to 72 hours              Patient currently not medically stable for discharge  Consultants:   None  Procedures:   None  Antimicrobials:  Azithromycin, ceftriaxone 03/19/2020  Subjective: No acute issues or events overnight, right pleuritic chest pain ongoing-worse with deep inspiration and cough, denies sputum production fevers chills nausea vomiting diarrhea constipation headache.  Objective: Vitals:   03/20/20 0900 03/20/20 0930 03/20/20 1000 03/20/20 1015  BP: (!) 109/37  (!) 144/51   Pulse: 80 76 81 78  Resp: 16 16 18 20   Temp:      TempSrc:      SpO2: 92% 92% 91% 93%  Weight:      Height:        Intake/Output Summary (Last 24 hours) at 03/20/2020 1055 Last data filed at 03/20/2020 0024 Gross per 24 hour  Intake 348.08 ml  Output --  Net 348.08 ml   Filed Weights   03/19/20 1453  Weight: 65.8 kg    Examination:  General exam: Appears calm and comfortable  Respiratory system: Clear to auscultation. Respiratory effort normal. Cardiovascular system: S1 & S2 heard, RRR. No JVD, murmurs, rubs, gallops or clicks. No pedal edema. Gastrointestinal system: Abdomen is nondistended, soft and nontender. No organomegaly or masses felt. Normal bowel sounds heard. Central nervous system: Alert and oriented. No focal neurological deficits. Extremities: Symmetric 5 x 5 power. Skin: No rashes, lesions or ulcers Psychiatry: Judgement and insight appear normal. Mood & affect appropriate.     Data Reviewed: I have personally reviewed following labs and imaging studies  CBC: Recent Labs  Lab 03/19/20 1509 03/20/20 0323  WBC 16.9* 12.5*  HGB 8.2* 7.8*  HCT 26.7* 25.6*  MCV 97.4 98.1  PLT 89* 72*   Basic Metabolic Panel: Recent Labs  Lab 03/19/20 1509 03/20/20 0323  NA 135 137  K 3.4* 3.2*  CL 96* 99  CO2 25 24  GLUCOSE 253* 120*  BUN 47* 56*  CREATININE 5.76* 5.99*  CALCIUM 9.1 8.9  PHOS  --  2.4*   GFR: Estimated Creatinine  Clearance: 6.6 mL/min (A) (by C-G formula based on SCr of 5.99 mg/dL (H)). Liver Function Tests: Recent Labs  Lab 03/20/20 0323  ALBUMIN 2.3*   No results for input(s): LIPASE, AMYLASE in the last 168 hours. No results for input(s): AMMONIA in the last 168 hours. Coagulation Profile: No results for input(s): INR, PROTIME in the last 168 hours. Cardiac Enzymes: No results for input(s): CKTOTAL, CKMB, CKMBINDEX, TROPONINI in the last 168 hours. BNP (last 3 results) No results for input(s): PROBNP in the last 8760 hours. HbA1C: No results for input(s): HGBA1C in the last 72 hours. CBG: No results for input(s): GLUCAP in the last 168 hours. Lipid Profile: No results for input(s): CHOL, HDL, LDLCALC, TRIG, CHOLHDL, LDLDIRECT in the last 72 hours. Thyroid Function Tests: No results for input(s): TSH, T4TOTAL, FREET4, T3FREE, THYROIDAB in the last 72 hours. Anemia Panel: No results for input(s): VITAMINB12, FOLATE, FERRITIN, TIBC, IRON, RETICCTPCT in the last 72 hours. Sepsis  Labs: Recent Labs  Lab 03/19/20 2213  LATICACIDVEN 2.3*    Recent Results (from the past 240 hour(s))  Blood culture (routine x 2)     Status: None (Preliminary result)   Collection Time: 03/19/20 10:13 PM   Specimen: BLOOD LEFT HAND  Result Value Ref Range Status   Specimen Description BLOOD LEFT HAND  Final   Special Requests   Final    BOTTLES DRAWN AEROBIC AND ANAEROBIC Blood Culture results may not be optimal due to an inadequate volume of blood received in culture bottles   Culture   Final    NO GROWTH < 12 HOURS Performed at [facility], [place] [address], [place], [place] [address]    Report Status PENDING  Incomplete  Respiratory Panel by RT PCR (Flu A&B, Covid) - Nasopharyngeal Swab     Status: None   Collection Time: 03/19/20 10:47 PM   Specimen: Nasopharyngeal Swab  Result Value Ref Range Status   SARS Coronavirus 2 by RT PCR NEGATIVE NEGATIVE Final    Comment: (NOTE) SARS-CoV-2 target  nucleic acids are NOT DETECTED.  The SARS-CoV-2 RNA is generally detectable in upper respiratoy specimens during the acute phase of infection. The lowest concentration of SARS-CoV-2 viral copies this assay can detect is 131 copies/mL. A negative result does not preclude SARS-Cov-2 infection and should not be used as the sole basis for treatment or other patient management decisions. A negative result may occur with  improper specimen collection/handling, submission of specimen other than nasopharyngeal swab, presence of viral mutation(s) within the areas targeted by this assay, and inadequate number of viral copies (<131 copies/mL). A negative result must be combined with clinical observations, patient history, and epidemiological information. The expected result is Negative.  Fact Sheet for Patients:  PinkCheek.be  Fact Sheet for Healthcare Providers:  GravelBags.it  This test is no t yet approved or cleared by the [place] FDA and  has been authorized for detection and/or diagnosis of SARS-CoV-2 by FDA under an Emergency Use Authorization (EUA). This EUA will remain  in effect (meaning this test can be used) for the duration of the COVID-19 declaration under Section 564(b)(1) of the Act, 21 U.S.C. section 360bbb-3(b)(1), unless the authorization is terminated or revoked sooner.     Influenza A by PCR NEGATIVE NEGATIVE Final   Influenza B by PCR NEGATIVE NEGATIVE Final    Comment: (NOTE) The Xpert Xpress SARS-CoV-2/FLU/RSV assay is intended as an aid in  the diagnosis of influenza from Nasopharyngeal swab specimens and  should not be used as a sole basis for treatment. Nasal washings and  aspirates are unacceptable for Xpert Xpress SARS-CoV-2/FLU/RSV  testing.  Fact Sheet for Patients: PinkCheek.be  Fact Sheet for Healthcare  Providers: GravelBags.it  This test is not yet approved or cleared by the [place] FDA and  has been authorized for detection and/or diagnosis of SARS-CoV-2 by  FDA under an Emergency Use Authorization (EUA). This EUA will remain  in effect (meaning this test can be used) for the duration of the  Covid-19 declaration under Section 564(b)(1) of the Act, 21  U.S.C. section 360bbb-3(b)(1), unless the authorization is  terminated or revoked. Performed at [facility], [place] [address], [place], [place] [address]          Radiology Studies: DG Chest 2 View  Result Date: 03/19/2020 CLINICAL DATA:  Dialysis patient with abnormal chest x-ray, subsequent encounter EXAM: CHEST - 2 VIEW COMPARISON:  Film from earlier in the  same day. FINDINGS: Cardiac shadow is within normal limits. Patchy airspace opacity is noted in the left lower lobe, more linear when compared with the prior exam. No other focal infiltrate or effusion is seen. No acute bony abnormality is noted. IMPRESSION: Left lower lobe infiltrate relatively stable from the prior study. Electronically Signed   By: Inez Catalina M.D.   On: 03/19/2020 15:35        Scheduled Meds: . (feeding supplement) PROSource Plus  30 mL Oral BID  . aspirin  81 mg Oral Daily  . atorvastatin  40 mg Oral q1800  . carvedilol  3.125 mg Oral BID WC  . citalopram  10 mg Oral Q breakfast  . clopidogrel  75 mg Oral Q breakfast  . docusate sodium  100 mg Oral Q lunch  . enoxaparin (LOVENOX) injection  30 mg Subcutaneous Q24H  . feeding supplement (NEPRO CARB STEADY)  237 mL Oral Q lunch  . glipiZIDE  5 mg Oral QAC breakfast  . levETIRAcetam  250 mg Oral BID WC  . levothyroxine  100 mcg Oral QAC breakfast  . linagliptin  5 mg Oral Q breakfast  . lipase/protease/amylase  48,000 Units Oral TID WC  . multivitamin  1 tablet Oral Q breakfast  . pantoprazole  40 mg Oral Q breakfast  . sevelamer carbonate  2,400 mg Oral  TID WC  . sodium chloride flush  3 mL Intravenous Q12H   Continuous Infusions: . azithromycin    . cefTRIAXone (ROCEPHIN)  IV      LOS: 0 days   Time spent: 78mn  Terica Yogi C Kwadwo Taras, DO [facility]  If 7PM-7AM, please contact night-coverage [email or web address]  03/20/2020, 10:55 AM

## 2020-03-20 NOTE — H&P (Signed)
History and Physical   Samantha Conrad EXH:[id number] DOB: Oct 08, 1940 DOA: 03/19/2020  PCP: [facility]   Patient coming from: Home, was also seen in [place] ED  Chief Complaint: Fever and cough  HPI: Samantha Conrad is a 79 y.o. female with medical history significant of CAD status post STEMI in 2020 and subsequent DES x2, chronic pancreatitis, diabetes, ESRD on peritoneal dialysis, hyperlipidemia, hypothyroidism, LPR, seizure, thrombocytopenia who presents with fever and cough.  Patient states she has had 1 week of cough and intermittent fever as high as 103.  The home health individual who came to do her peritoneal dialysis today told the patient that she was concerned about the fever and recommend that she go to the ED.  Patient was seen in the [facility] ED where she had a chest x-ray done which showed pneumonia, however the patient was instructed to come to [facility] as they would be unable to assist her with peritoneal dialysis if admitted there.  Patient denies shortness of breath, chest pain.  Reports some mild abdominal discomfort since arriving in our ED.   ED Course: Vital signs stable in ED.  Labs showed hypokalemia at 3.4 and creatinine elevated to 5.76 consistent with ESRD.  Glucose noted to be elevated to 53.  Hemoglobin 8.2 which is stable for her, platelets 89 which is mildly reduced from her baseline thrombocytopenia of 110, and leukocytosis to 16.  Troponins were flat with initial troponin of 35 and repeat 36.  Lactic acid, blood cultures, respiratory panel were ordered but not back at the time of admission.  EKG showed sinus rhythm with first-degree AV block, PVCs.  Chest x-ray showed left lower lobe infiltrate.  Patient received ceftriaxone and azithromycin in the ED.  Review of Systems: As per HPI otherwise all other systems reviewed and are negative.  Past Medical History:  Diagnosis Date  . Anemia   . Basal cell carcinoma     on nose/face  . CAD  (coronary artery disease)    a. 08/2018: posterior STEMI s/p DES to LCx x2  . Depression   . ESRD on peritoneal dialysis ([place])    "q night" (11/29/2017)  . Fatty liver   . Hypertension   . Hypothyroidism   . NASH (nonalcoholic steatohepatitis)   . Seizures ([place])    ? when she may have had a stroke sometime in the spring of 2018  . Sleep apnea    diagnosed years ago "after I'd gained alot of weight;  no longer a problem since weight loss"  . Stroke [facility]) 04/2016   memory issues, lost 1/2 vision in both eyes  . Thrombocytopenia ([place])   . Thyroid disease   . Type II diabetes mellitus ([place])     Past Surgical History:  Procedure Laterality Date  . ABDOMINAL HYSTERECTOMY    . AV FISTULA PLACEMENT Right 10/30/2016   Procedure: INSERTION OF ARTERIOVENOUS (AV) GORE-TEX GRAFT RIGHT UPPER ARM;  Surgeon: Elam Dutch, MD;  Location: [place];  Service: Vascular;  Laterality: Right;  . CATARACT EXTRACTION W/ INTRAOCULAR LENS  IMPLANT, BILATERAL Bilateral   . CHOLECYSTECTOMY OPEN    . CORNEAL TRANSPLANT Bilateral   . CORONARY ANGIOGRAPHY N/A 09/06/2018   Procedure: CORONARY ANGIOGRAPHY;  Surgeon: Lorretta Harp, MD;  Location: [place] CV LAB;  Service: Cardiovascular;  Laterality: N/A;  . CORONARY/GRAFT ACUTE MI REVASCULARIZATION N/A 09/06/2018   Procedure: Coronary/Graft Acute MI Revascularization;  Surgeon: Lorretta Harp, MD;  Location: [place] [facility]  LAB;  Service: Cardiovascular;  Laterality: N/A;  . EYE SURGERY    . KNEE ARTHROSCOPY Left   . LEFT HEART CATH AND CORONARY ANGIOGRAPHY N/A 09/06/2018   Procedure: LEFT HEART CATH AND CORONARY ANGIOGRAPHY;  Surgeon: Lorretta Harp, MD;  Location: [place] CV LAB;  Service: Cardiovascular;  Laterality: N/A;  . MOHS SURGERY     "nose"  . SHOULDER OPEN ROTATOR CUFF REPAIR Left   . THROMBECTOMY AND REVISION OF ARTERIOVENTOUS (AV) GORETEX  GRAFT Right 12/04/2016   Procedure: THROMBECTOMY/ REVISION OF RIGHT UPPER ARM ARTERIOVENOUS GORETEX  GRAFT;  Surgeon: Elam Dutch, MD;  Location: [place];  Service: Vascular;  Laterality: Right;  . TONSILLECTOMY      Social History  reports that she has never smoked. She has quit using smokeless tobacco.  Her smokeless tobacco use included snuff. She reports that she does not drink alcohol and does not use drugs.  Allergies  Allergen Reactions  . Contrast Media [Iodinated Diagnostic Agents] Swelling    SWELLING REACTION UNSPECIFIED   . Tape Other (See Comments) and Dermatitis    Tears skin - please use paper tape Paper tape only   Family History  Problem Relation Age of Onset  . Tuberculosis Mother   Reviewed on admission  Prior to Admission medications   Medication Sig Start Date End Date Taking? Authorizing Provider  acetaminophen-codeine (TYLENOL #3) 300-30 MG tablet Take 1-2 tablets by mouth every 6 (six) hours as needed for moderate pain.    [provider]  Amino Acids-Protein Hydrolys (FEEDING SUPPLEMENT, PRO-STAT SUGAR FREE 64,) LIQD Take 30 mLs by mouth 2 (two) times daily. 03/16/17   Doreatha Lew, MD  amLODipine (NORVASC) 5 MG tablet Take 1 tablet (5 mg total) by mouth daily. 07/31/19 10/29/19  Troy Sine, MD  aspirin 81 MG chewable tablet Chew 1 tablet (81 mg total) by mouth daily. 09/08/18   Eileen Stanford, PA-C  atorvastatin (LIPITOR) 40 MG tablet TAKE 1 TABLET (40 MG TOTAL) BY MOUTH DAILY AT 6 PM. 01/13/20 02/12/20  Troy Sine, MD  carvedilol (COREG) 3.125 MG tablet TAKE 1 TABLET (3.125 MG TOTAL) BY MOUTH 2 (TWO) TIMES DAILY WITH A MEAL. 02/10/20   Troy Sine, MD  citalopram (CELEXA) 10 MG tablet Take 10 mg by mouth daily with breakfast.  10/15/17   [provider]  clopidogrel (PLAVIX) 75 MG tablet TAKE 1 TABLET (75 MG TOTAL) BY MOUTH DAILY WITH BREAKFAST. 11/04/19   Troy Sine, MD  docusate sodium (COLACE) 100 MG capsule Take 100 mg by mouth daily with lunch.    [provider]  furosemide (LASIX) 40 MG tablet Take 2  tablets (80 mg total) by mouth 2 (two) times daily with breakfast and lunch. 07/31/19   Troy Sine, MD  glipiZIDE (GLUCOTROL) 5 MG tablet Take 5 mg by mouth daily before breakfast.  06/25/18   [provider]  levETIRAcetam (KEPPRA) 250 MG tablet Take 250 mg by mouth 2 (two) times daily with a meal.     [provider]  levothyroxine (SYNTHROID, LEVOTHROID) 100 MCG tablet Take 100 mcg by mouth daily before breakfast.  09/02/16   [provider]  linagliptin (TRADJENTA) 5 MG TABS tablet Take 5 mg by mouth daily with breakfast.     [provider]  lipase/protease/amylase (CREON) 12000 units CPEP capsule Take 48,000 Units by mouth 3 (three) times daily with meals.    [provider]  midodrine (PROAMATINE) 5  MG tablet Take 5 mg by mouth daily as needed (SBP <110).  Patient not taking: Reported on 01/21/2020 07/30/18   [provider]  multivitamin (PROSIGHT) TABS tablet Take 1 tablet by mouth daily. Patient taking differently: Take 1 tablet by mouth daily with breakfast.  03/17/17   Patrecia Pour, Christean Grief, MD  nitroGLYCERIN (NITROSTAT) 0.4 MG SL tablet Place 0.4 mg under the tongue every 5 (five) minutes as needed for chest pain. Patient not taking: Reported on 01/21/2020    [provider]  Nutritional Supplements (FEEDING SUPPLEMENT, NEPRO CARB STEADY,) LIQD Take 237 mLs by mouth daily. Patient taking differently: Take 237 mLs by mouth daily with lunch.  03/17/17   Patrecia Pour, Christean Grief, MD  ondansetron (ZOFRAN ODT) 8 MG disintegrating tablet Take 1 tablet (8 mg total) by mouth every 8 (eight) hours as needed for nausea or vomiting. 09/22/18   Jola Schmidt, MD  pantoprazole (PROTONIX) 40 MG tablet Take 40 mg by mouth daily with breakfast.  08/21/18   [provider]  polyethylene glycol (MIRALAX / GLYCOLAX) packet Take 17 g by mouth daily.     [provider]  potassium chloride SA (K-DUR,KLOR-CON) 20 MEQ tablet Take 1 tablet (20  mEq total) by mouth 2 (two) times daily. Patient taking differently: Take 20 mEq by mouth daily with breakfast.  11/30/17   Meccariello, Bernita Raisin, DO  prednisoLONE acetate (PRED FORTE) 1 % ophthalmic suspension Place 1 drop into both eyes at bedtime.     [provider]  sevelamer carbonate (RENVELA) 800 MG tablet Take 2,400 mg by mouth 3 (three) times daily with meals.  07/04/18   [provider]  traZODone (DESYREL) 50 MG tablet Take 50 mg by mouth at bedtime.    [provider]    Physical Exam: Vitals:   03/19/20 2315 03/19/20 2330 03/19/20 2345 03/20/20 0000  BP:      Pulse: 79 86 87 80  Resp: 14 16 17  (!) 24  Temp:      TempSrc:      SpO2: 95% 96% 94% 95%  Weight:      Height:       Physical Exam Constitutional:      General: She is not in acute distress.    Appearance: Normal appearance.     Comments: Elderly female  HENT:     Head: Normocephalic and atraumatic.     Mouth/Throat:     Mouth: Mucous membranes are moist.     Pharynx: Oropharynx is clear.  Eyes:     Extraocular Movements: Extraocular movements intact.     Pupils: Pupils are equal, round, and reactive to light.  Cardiovascular:     Rate and Rhythm: Normal rate and regular rhythm.     Pulses: Normal pulses.     Heart sounds: Normal heart sounds.  Pulmonary:     Effort: Pulmonary effort is normal. No respiratory distress.     Breath sounds: Rhonchi present.  Abdominal:     General: Bowel sounds are normal. There is no distension.     Palpations: Abdomen is soft.     Tenderness: There is no abdominal tenderness.  Musculoskeletal:        General: No swelling or deformity.  Skin:    General: Skin is warm and dry.  Neurological:     General: No focal deficit present.     Mental Status: Mental status is at baseline.    Labs on Admission: I have personally reviewed following labs and  imaging studies  CBC: Recent Labs  Lab 03/19/20 1509  WBC 16.9*  HGB 8.2*  HCT 26.7*  MCV  97.4  PLT 89*    Basic Metabolic Panel: Recent Labs  Lab 03/19/20 1509  NA 135  K 3.4*  CL 96*  CO2 25  GLUCOSE 253*  BUN 47*  CREATININE 5.76*  CALCIUM 9.1    GFR: Estimated Creatinine Clearance: 6.8 mL/min (A) (by C-G formula based on SCr of 5.76 mg/dL (H)).  Liver Function Tests: No results for input(s): AST, ALT, ALKPHOS, BILITOT, PROT, ALBUMIN in the last 168 hours.  Urine analysis:    Component Value Date/Time   COLORURINE YELLOW 09/13/2018 1554   APPEARANCEUR CLEAR 09/13/2018 1554   LABSPEC 1.008 09/13/2018 1554   PHURINE 9.0 (H) 09/13/2018 1554   GLUCOSEU 150 (A) 09/13/2018 1554   HGBUR NEGATIVE 09/13/2018 [place] 09/13/2018 1554   [place] 09/13/2018 1554   PROTEINUR 30 (A) 09/13/2018 1554   NITRITE NEGATIVE 09/13/2018 1554   LEUKOCYTESUR NEGATIVE 09/13/2018 1554    Radiological Exams on Admission: DG Chest 2 View  Result Date: 03/19/2020 CLINICAL DATA:  Dialysis patient with abnormal chest x-ray, subsequent encounter EXAM: CHEST - 2 VIEW COMPARISON:  Film from earlier in the same day. FINDINGS: Cardiac shadow is within normal limits. Patchy airspace opacity is noted in the left lower lobe, more linear when compared with the prior exam. No other focal infiltrate or effusion is seen. No acute bony abnormality is noted. IMPRESSION: Left lower lobe infiltrate relatively stable from the prior study. Electronically Signed   By: Inez Catalina M.D.   On: 03/19/2020 15:35    EKG: Independently reviewed.  Sinus rhythm with first-degree AV block and PVC.  Assessment/Plan Active Problems:   Laryngopharyngeal reflux (LPR)   Diet-controlled diabetes mellitus (HCC)   Hypothyroidism   ESRD on dialysis ([place])   Seizure disorder   HLD (hyperlipidemia)   Chronic pancreatitis (HCC)   CAD (coronary artery disease)   Anemia   Thrombocytopenia (HCC)   PNA (pneumonia)  Community-acquired pneumonia > 1 week of fever and cough. Left lower  lobe infiltrate on chest x-ray, leukocytosis to 16 - Continue ceftriaxone and azithromycin, consider transition to p.o. tomorrow if good response - Left Cenafed as needed - Follow-up cultures and lactic acid  CAD status post STEMI in 2020 and subsequent DES x2 - Continue home Coreg and Lipitor  Chronic pancreatitis - Continue home Creon and as needed Zofran  Diabetes - Currently diet controlled - Monitor CBGs  ESRD on peritoneal dialysis > Nephrology consult admission will see patient in the morning - Follow renal function - Avoid nephrotoxic agents  Hyperlipidemia Continue home Lipitor  Hypothyroidism -Continue home Synthroid  LPR -Continue home PPI  Seizure - Continue home Keppra  Anemia Thrombocytopenia > Chronic with baseline Hgb 8 and baseline PLTs. > HPG 8.2 and PLT 89 on admission - Follow CBC  Hypertension? in the chart and patient has history of medication for both hypertension and hypertension per chart review unsure what she is currently taking, antihypertensives and midodrine both held.  DVT prophylaxis: Lovenox Code Status:   DNR Family Communication:  None on admission  Disposition Plan:   Patient is from:  Home  Anticipated DC to:  Home  Anticipated DC date:  10/24, pending clinical response    Consults called:  Nephrology, Dr. Candiss Norse  Admission status:  Observation, MedSurg  Severity of Illness: The appropriate patient status for this patient is OBSERVATION. Observation  status is judged to be reasonable and necessary in order to provide the required intensity of service to ensure the patient's safety. The patient's presenting symptoms, physical exam findings, and initial radiographic and laboratory data in the context of their medical condition is felt to place them at decreased risk for further clinical deterioration. Furthermore, it is anticipated that the patient will be medically stable for discharge from the hospital within 2 midnights of  admission. The following factors support the patient status of observation.   " The patient's presenting symptoms include cough and fever. " The physical exam findings include rhonchi. " The initial radiographic and laboratory data are leukocytosis and left lower lobe consolidation.   Marcelyn Bruins MD [facility]  How to contact the [facility] Attending or Consulting provider [place] or covering provider during after hours [place], for this patient?   1. Check the care team in [facility] and look for a) attending/consulting TRH provider listed and b) the [facility] team listed 2. Log into [email or web address] and use [place]'s universal password to access. If you do not have the password, please contact the hospital operator. 3. Locate the [facility] provider you are looking for under [facility] and page to a number that you can be directly reached. 4. If you still have difficulty reaching the provider, please page the [facility] (Director on Call) for the Hospitalists listed on amion for assistance.  03/20/2020, 2:16 AM

## 2020-03-20 NOTE — Evaluation (Addendum)
Physical Therapy Evaluation Patient Details Name: Samantha Conrad MRN: [id number] DOB: 1941/01/28 Today's Date: 03/20/2020   History of Present Illness  Pt is a 80 y.o. F with significant PMH of ESRD on CCPD, DM, CVA, NASH, seizures, CAD with STEMI 2020 who presents with cough x 1 week, fever, chills and SOB. CXR found to have PNA.   Clinical Impression  Prior to admission, pt lives alone with good family support from daughter who lives in same apartment complex. She is independent with ADL's and uses a Rollator for community ambulation. 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 Pt presents with decreased functional mobility secondary to weakness and balance deficits. Ambulating 200 feet with no assistive device at a min guard assist level; SpO2 94-95% on RA. Recommended use of Rollator at home for improved stability and for energy conservation. Will continue to follow acutely but don't anticipate need for PT follow up.     Follow Up Recommendations No PT follow up;Supervision for mobility/OOB    Equipment Recommendations  None recommended by PT    Recommendations for Other Services       Precautions / Restrictions Precautions Precautions: Fall;Other (comment) Precaution Comments: R hemianopia Restrictions Weight Bearing Restrictions: No      Mobility  Bed Mobility Overal bed mobility: Independent                  Transfers Overall transfer level: Needs assistance Equipment used: None Transfers: Sit to/from Stand Sit to Stand: Supervision            Ambulation/Gait Ambulation/Gait assistance: Min guard Gait Distance (Feet): 200 Feet Assistive device: None Gait Pattern/deviations: Step-through pattern;Decreased stride length;Narrow base of support Gait velocity: decreased   General Gait Details: Mildly unsteady, min guard assist for safety  Stairs            Wheelchair Mobility    Modified Rankin  (Stroke Patients Only)       Balance Overall balance assessment: Mild deficits observed, not formally tested                                           Pertinent Vitals/Pain Pain Assessment: Faces Faces Pain Scale: Hurts little more Pain Location: stomach Pain Descriptors / Indicators: Discomfort Pain Intervention(s): Monitored during session    Home Living Family/patient expects to be discharged to:: Private residence Living Arrangements: Alone Available Help at Discharge: Family;Available PRN/intermittently (daughter lives in same apt complex) Type of Home: Apartment Home Access: Level entry     Home Layout: One level Home Equipment: Tub bench;Cane - single point;Walker - 4 wheels      Prior Function Level of Independence: Needs assistance   Gait / Transfers Assistance Needed: uses Rollator for community ambulation  ADL's / Homemaking Assistance Needed: daughter assists with cleaning apartment, son assists with filling pill box. Pt uses app for pill reminders        Hand Dominance        Extremity/Trunk Assessment   Upper Extremity Assessment Upper Extremity Assessment: Overall WFL for tasks assessed    Lower Extremity Assessment Lower Extremity Assessment: Generalized weakness       Communication   Communication: No difficulties  Cognition Arousal/Alertness: Awake/alert Behavior During Therapy: WFL for tasks assessed/performed Overall Cognitive Status: History of cognitive impairments - at baseline  General Comments: History of STM deficits from prior stroke.       General Comments      Exercises     Assessment/Plan    PT Assessment Patient needs continued PT services  PT Problem List Decreased strength;Decreased activity tolerance;Decreased mobility;Decreased balance       PT Treatment Interventions DME instruction;Gait training;Functional mobility training;Therapeutic  activities;Therapeutic exercise;Balance training;Patient/family education    PT Goals (Current goals can be found in the Care Plan section)  Acute Rehab PT Goals Patient Stated Goal: feel stronger PT Goal Formulation: With patient Time For Goal Achievement: 04/03/20 Potential to Achieve Goals: Good    Frequency Min 3X/week   Barriers to discharge        Co-evaluation               AM-PAC PT "6 Clicks" Mobility  Outcome Measure Help needed turning from your back to your side while in a flat bed without using bedrails?: None Help needed moving from lying on your back to sitting on the side of a flat bed without using bedrails?: None Help needed moving to and from a bed to a chair (including a wheelchair)?: None Help needed standing up from a chair using your arms (e.g., wheelchair or bedside chair)?: None Help needed to walk in hospital room?: A Little Help needed climbing 3-5 steps with a railing? : A Little 6 Click Score: 22    End of Session Equipment Utilized During Treatment: Gait belt Activity Tolerance: Patient tolerated treatment well Patient left: in bed;with call bell/phone within reach Nurse Communication: Mobility status PT Visit Diagnosis: Unsteadiness on feet (R26.81)    Time: 3276-1470 PT Time Calculation (min) (ACUTE ONLY): 19 min   Charges:   PT Evaluation $PT Eval Moderate Complexity: [place], PT, DPT Acute Rehabilitation Services Pager [phone] Office [phone]   Deno Etienne 03/20/2020, 3:13 PM

## 2020-03-21 ENCOUNTER — Encounter (HOSPITAL_COMMUNITY): Payer: Self-pay | Admitting: Internal Medicine

## 2020-03-21 LAB — COMPREHENSIVE METABOLIC PANEL
ALT: 20 U/L (ref 0–44)
AST: 22 U/L (ref 15–41)
Albumin: 2.4 g/dL — ABNORMAL LOW (ref 3.5–5.0)
Alkaline Phosphatase: 92 U/L (ref 38–126)
Anion gap: 15 (ref 5–15)
BUN: 58 mg/dL — ABNORMAL HIGH (ref 8–23)
CO2: 23 mmol/L (ref 22–32)
Calcium: 8.7 mg/dL — ABNORMAL LOW (ref 8.9–10.3)
Chloride: 97 mmol/L — ABNORMAL LOW (ref 98–111)
Creatinine, Ser: 6.07 mg/dL — ABNORMAL HIGH (ref 0.44–1.00)
GFR, Estimated: 7 mL/min — ABNORMAL LOW (ref >60)
Glucose, Bld: 310 mg/dL — ABNORMAL HIGH (ref 70–99)
Potassium: 3.4 mmol/L — ABNORMAL LOW (ref 3.5–5.1)
Sodium: 135 mmol/L (ref 135–145)
Total Bilirubin: 0.6 mg/dL (ref 0.3–1.2)
Total Protein: 5.7 g/dL — ABNORMAL LOW (ref 6.5–8.1)

## 2020-03-21 LAB — CBC
HCT: 26.2 % — ABNORMAL LOW (ref 36.0–46.0)
Hemoglobin: 8.1 g/dL — ABNORMAL LOW (ref 12.0–15.0)
MCH: 29.8 pg (ref 26.0–34.0)
MCHC: 30.9 g/dL (ref 30.0–36.0)
MCV: 96.3 fL (ref 80.0–100.0)
Platelets: 78 10*3/uL — ABNORMAL LOW (ref 150–400)
RBC: 2.72 MIL/uL — ABNORMAL LOW (ref 3.87–5.11)
RDW: 15.1 % (ref 11.5–15.5)
WBC: 9.7 10*3/uL (ref 4.0–10.5)
nRBC: 0 % (ref 0.0–0.2)

## 2020-03-21 MED ORDER — POTASSIUM CHLORIDE CRYS ER 20 MEQ PO TBCR
20.0000 meq | EXTENDED_RELEASE_TABLET | Freq: Two times a day (BID) | ORAL | Status: AC
  Administered 2020-03-21 (×2): 20 meq via ORAL
  Filled 2020-03-21 (×2): qty 1

## 2020-03-21 MED ORDER — PREDNISOLONE ACETATE 1 % OP SUSP
1.0000 [drp] | Freq: Every day | OPHTHALMIC | Status: DC
  Administered 2020-03-21: 1 [drp] via OPHTHALMIC
  Filled 2020-03-21: qty 5

## 2020-03-21 MED ORDER — POTASSIUM CHLORIDE CRYS ER 20 MEQ PO TBCR
20.0000 meq | EXTENDED_RELEASE_TABLET | Freq: Every day | ORAL | Status: DC
  Administered 2020-03-22: 20 meq via ORAL
  Filled 2020-03-21: qty 1

## 2020-03-21 MED ORDER — HEPARIN SODIUM (PORCINE) 5000 UNIT/ML IJ SOLN
5000.0000 [IU] | Freq: Three times a day (TID) | INTRAMUSCULAR | Status: DC
  Administered 2020-03-22: 5000 [IU] via SUBCUTANEOUS
  Filled 2020-03-21: qty 1

## 2020-03-21 MED ORDER — GENTAMICIN SULFATE 0.1 % EX CREA: 1.0000 "application " | TOPICAL_CREAM | Freq: Every day | CUTANEOUS | Status: DC | PRN

## 2020-03-21 NOTE — Plan of Care (Signed)
  Problem: Education: Goal: Knowledge of General Education information will improve Description: Including pain rating scale, medication(s)/side effects and non-pharmacologic comfort measures Outcome: Progressing   Problem: Clinical Measurements: Goal: Will remain free from infection Outcome: Progressing   Problem: Nutrition: Goal: Adequate nutrition will be maintained Outcome: Progressing

## 2020-03-21 NOTE — Progress Notes (Signed)
PROGRESS NOTE    Samantha Conrad  VVZ:[id number] DOB: 1941/05/29 DOA: 03/19/2020 PCP: [facility]   Brief Narrative:   Samantha Conrad is a 79 y.o. female with medical history significant of CAD status post STEMI in 2020 and subsequent DES x2, chronic pancreatitis, diabetes, ESRD on peritoneal dialysis, hyperlipidemia, hypothyroidism, LPR, seizure, thrombocytopenia who presents with fever and cough.  Patient states she has had 1 week of cough and intermittent fever as high as 103.  The home health individual who came to do her peritoneal dialysis today told the patient that she was concerned about the fever and recommend that she go to the ED.  Patient was seen in the [facility] ED where she had a chest x-ray done which showed pneumonia, however the patient was instructed to come to [facility] as they would be unable to assist her with peritoneal dialysis if admitted there.  Patient denies shortness of breath, chest pain.  Reports some mild abdominal discomfort since arriving in our ED. In ED: labs showed hypokalemia at 3.4 and creatinine elevated to 5.76 consistent with ESRD.  Glucose noted to be elevated to 53.  Hemoglobin 8.2 which is stable for her, platelets 89 which is mildly reduced from her baseline thrombocytopenia of 110, and leukocytosis to 16.  Troponins were flat with initial troponin of 35 and repeat 36.  Lactic acid, blood cultures, respiratory panel were ordered but not back at the time of admission.  EKG showed sinus rhythm with first-degree AV block, PVCs.  Chest x-ray showed left lower lobe infiltrate.  Patient received ceftriaxone and azithromycin in the ED -hospitalist called for admission.   Assessment & Plan:   Active Problems:   Laryngopharyngeal reflux (LPR)   Diet-controlled diabetes mellitus (HCC)   Hypothyroidism   ESRD on dialysis ([place])   Seizure disorder   HLD (hyperlipidemia)   Chronic pancreatitis (HCC)   CAD (coronary artery disease)    Anemia   Thrombocytopenia (HCC)   PNA (pneumonia)   Acute likely recurrent aspiration versus community-acquired pneumonia, POA Sepsis in the setting of pneumonia, POA - Patient has notable leukocytosis, tachypneic on evaluation into the 20s with notable pneumonia on imaging meeting sepsis criteria - Lactic acidosis in the setting of sepsis/dehydration/profound infection - Patient indicates weeks if not months of dysphagia and difficulty swallowing with moderate frequency, does not recall being on any special diet at home but does get "choked" on food quite frequently - SLP to follow, may benefit from MBS - Continue ceftriaxone and azithromycin,  continue to follow clinically - Continue supportive care, incentive spirometry, flutter - Patient not currently hypoxic however she is borderline at rest in the low 90s, follow ambulatory oxygen screening for possible hypoxia in the setting of likely recurrent aspiration pneumonia  CAD status post STEMI in 2020 and subsequent DES x2 - Continue home Coreg and Lipitor  Chronic pancreatitis - Continue home Creon and as needed Zofran  Diabetes - Currently diet controlled - Monitor CBGs  ESRD on peritoneal dialysis - Nephrology following, appreciate assistance - Follow renal function - Avoid nephrotoxic agents  Hyperlipidemia - Continue home Lipitor  Hypothyroidism -Continue home Synthroid  LPR -Continue home PPI  Seizure - Continue home Keppra  Anemia Thrombocytopenia - Chronic with baseline Hgb 8 and baseline PLTs. - Follow CBC  Hypertension versus hypotension  - In the chart and patient has history of medication for both hypertension and hypotension - currently on carvedilol/torsemide - no longer on midodrine.   DVT prophylaxis:  Lovenox Code Status:              DNR Family Communication: None present  Status is: Transition to inpatient  Dispo: The patient is from: Home              Anticipated d/c is to:  To be determined              Anticipated d/c date is: 24-48 hours              Patient currently not medically stable for discharge given ongoing profound dyspnea with minimal exertion/need for further eval with speech to confirm dysphagia as source of recurrent pneumonias  Consultants:   None  Procedures:   None  Antimicrobials:  Azithromycin, ceftriaxone 03/19/2020  Subjective: No acute issues or events overnight, right pleuritic chest pain ongoing if not somewhat worse this morning - notably worse with deep inspiration and cough. She otherwise denies sputum production fevers chills nausea vomiting diarrhea constipation headache.  Objective: Vitals:   03/20/20 1646 03/20/20 2009 03/20/20 2316 03/21/20 0240  BP: (!) 133/54 (!) 149/58 (!) 136/56 (!) 148/53  Pulse: 75 74 79 87  Resp: 17 18 15 16   Temp: 98.1 F (36.7 C) 99 F (37.2 C) 98.8 F (37.1 C) 98.7 F (37.1 C)  TempSrc: Oral Oral Oral Oral  SpO2: 95% 94% 90% 91%  Weight:      Height:        Intake/Output Summary (Last 24 hours) at 03/21/2020 0806 Last data filed at 03/21/2020 0600 Gross per 24 hour  Intake 9377 ml  Output 9416 ml  Net -39 ml   Filed Weights   03/19/20 1453  Weight: 65.8 kg    Examination:  General exam: Appears calm and comfortable  Respiratory system: diminished breath sounds bilaterally, scant ronchi bibasilarly. Cardiovascular system: S1 & S2 heard, RRR. No JVD, murmurs, rubs, gallops or clicks. No pedal edema. Gastrointestinal system: Abdomen is nondistended, soft and nontender. No organomegaly or masses felt. Normal bowel sounds heard. Central nervous system: Alert and oriented. No focal neurological deficits. Extremities: Symmetric 5 x 5 power. Skin: No rashes, lesions or ulcers Psychiatry: Judgement and insight appear normal. Mood & affect appropriate.     Data Reviewed: I have personally reviewed following labs and imaging studies  CBC: Recent Labs  Lab 03/19/20 1509  03/20/20 0323 03/21/20 0257  WBC 16.9* 12.5* 9.7  HGB 8.2* 7.8* 8.1*  HCT 26.7* 25.6* 26.2*  MCV 97.4 98.1 96.3  PLT 89* 72* 78*   Basic Metabolic Panel: Recent Labs  Lab 03/19/20 1509 03/20/20 0323 03/21/20 0257  NA 135 137 135  K 3.4* 3.2* 3.4*  CL 96* 99 97*  CO2 25 24 23   GLUCOSE 253* 120* 310*  BUN 47* 56* 58*  CREATININE 5.76* 5.99* 6.07*  CALCIUM 9.1 8.9 8.7*  PHOS  --  2.4*  --    GFR: Estimated Creatinine Clearance: 6.5 mL/min (A) (by C-G formula based on SCr of 6.07 mg/dL (H)). Liver Function Tests: Recent Labs  Lab 03/20/20 0323 03/21/20 0257  AST  --  22  ALT  --  20  ALKPHOS  --  92  BILITOT  --  0.6  PROT  --  5.7*  ALBUMIN 2.3* 2.4*   No results for input(s): LIPASE, AMYLASE in the last 168 hours. No results for input(s): AMMONIA in the last 168 hours. Coagulation Profile: No results for input(s): INR, PROTIME in the last 168 hours. Cardiac Enzymes: No results for  input(s): CKTOTAL, CKMB, CKMBINDEX, TROPONINI in the last 168 hours. BNP (last 3 results) No results for input(s): PROBNP in the last 8760 hours. HbA1C: No results for input(s): HGBA1C in the last 72 hours. CBG: No results for input(s): GLUCAP in the last 168 hours. Lipid Profile: No results for input(s): CHOL, HDL, LDLCALC, TRIG, CHOLHDL, LDLDIRECT in the last 72 hours. Thyroid Function Tests: No results for input(s): TSH, T4TOTAL, FREET4, T3FREE, THYROIDAB in the last 72 hours. Anemia Panel: No results for input(s): VITAMINB12, FOLATE, FERRITIN, TIBC, IRON, RETICCTPCT in the last 72 hours. Sepsis Labs: Recent Labs  Lab 03/19/20 2213  LATICACIDVEN 2.3*    Recent Results (from the past 240 hour(s))  Blood culture (routine x 2)     Status: None (Preliminary result)   Collection Time: 03/19/20 10:13 PM   Specimen: BLOOD LEFT HAND  Result Value Ref Range Status   Specimen Description BLOOD LEFT HAND  Final   Special Requests   Final    BOTTLES DRAWN AEROBIC AND ANAEROBIC  Blood Culture results may not be optimal due to an inadequate volume of blood received in culture bottles   Culture   Final    NO GROWTH < 12 HOURS Performed at [facility], [place] [address] [place], [place] [address]    Report Status PENDING  Incomplete  Respiratory Panel by RT PCR (Flu A&B, Covid) - Nasopharyngeal Swab     Status: None   Collection Time: 03/19/20 10:47 PM   Specimen: Nasopharyngeal Swab  Result Value Ref Range Status   SARS Coronavirus 2 by RT PCR NEGATIVE NEGATIVE Final    Comment: (NOTE) SARS-CoV-2 target nucleic acids are NOT DETECTED.  The SARS-CoV-2 RNA is generally detectable in upper respiratoy specimens during the acute phase of infection. The lowest concentration of SARS-CoV-2 viral copies this assay can detect is 131 copies/mL. A negative result does not preclude SARS-Cov-2 infection and should not be used as the sole basis for treatment or other patient management decisions. A negative result may occur with  improper specimen collection/handling, submission of specimen other than nasopharyngeal swab, presence of viral mutation(s) within the areas targeted by this assay, and inadequate number of viral copies (<131 copies/mL). A negative result must be combined with clinical observations, patient history, and epidemiological information. The expected result is Negative.  Fact Sheet for Patients:  PinkCheek.be  Fact Sheet for Healthcare Providers:  GravelBags.it  This test is no t yet approved or cleared by the [place] FDA and  has been authorized for detection and/or diagnosis of SARS-CoV-2 by FDA under an Emergency Use Authorization (EUA). This EUA will remain  in effect (meaning this test can be used) for the duration of the COVID-19 declaration under Section 564(b)(1) of the Act, 21 U.S.C. section 360bbb-3(b)(1), unless the authorization is terminated or revoked sooner.      Influenza A by PCR NEGATIVE NEGATIVE Final   Influenza B by PCR NEGATIVE NEGATIVE Final    Comment: (NOTE) The Xpert Xpress SARS-CoV-2/FLU/RSV assay is intended as an aid in  the diagnosis of influenza from Nasopharyngeal swab specimens and  should not be used as a sole basis for treatment. Nasal washings and  aspirates are unacceptable for Xpert Xpress SARS-CoV-2/FLU/RSV  testing.  Fact Sheet for Patients: PinkCheek.be  Fact Sheet for Healthcare Providers: GravelBags.it  This test is not yet approved or cleared by the [place] FDA and  has been authorized for detection and/or diagnosis of SARS-CoV-2 by  FDA under an Emergency  Use Authorization (EUA). This EUA will remain  in effect (meaning this test can be used) for the duration of the  Covid-19 declaration under Section 564(b)(1) of the Act, 21  U.S.C. section 360bbb-3(b)(1), unless the authorization is  terminated or revoked. Performed at [facility], [place] [address] [place], [place] [address]          Radiology Studies: DG Chest 2 View  Result Date: 03/19/2020 CLINICAL DATA:  Dialysis patient with abnormal chest x-ray, subsequent encounter EXAM: CHEST - 2 VIEW COMPARISON:  Film from earlier in the same day. FINDINGS: Cardiac shadow is within normal limits. Patchy airspace opacity is noted in the left lower lobe, more linear when compared with the prior exam. No other focal infiltrate or effusion is seen. No acute bony abnormality is noted. IMPRESSION: Left lower lobe infiltrate relatively stable from the prior study. Electronically Signed   By: Inez Catalina M.D.   On: 03/19/2020 15:35        Scheduled Meds: . (feeding supplement) PROSource Plus  30 mL Oral BID  . aspirin  81 mg Oral Daily  . atorvastatin  40 mg Oral q1800  . carvedilol  3.125 mg Oral BID WC  . citalopram  10 mg Oral Q breakfast  . clopidogrel  75 mg Oral Q breakfast  . docusate  sodium  100 mg Oral Q lunch  . enoxaparin (LOVENOX) injection  30 mg Subcutaneous Q24H  . feeding supplement (NEPRO CARB STEADY)  237 mL Oral Q lunch  . gentamicin cream  1 application Topical Daily  . glipiZIDE  5 mg Oral QAC breakfast  . levETIRAcetam  250 mg Oral BID WC  . levothyroxine  100 mcg Oral QAC breakfast  . linagliptin  5 mg Oral Q breakfast  . lipase/protease/amylase  48,000 Units Oral TID WC  . multivitamin  1 tablet Oral Q breakfast  . pantoprazole  40 mg Oral Q breakfast  . potassium chloride  20 mEq Oral BID  . sevelamer carbonate  800 mg Oral TID WC  . sodium chloride flush  3 mL Intravenous Q12H  . torsemide  100 mg Oral Daily   Continuous Infusions: . azithromycin Stopped (03/21/20 0741)  . cefTRIAXone (ROCEPHIN)  IV Stopped (03/20/20 2139)  . dialysis solution 2.5% low-MG/low-CA Stopped (03/21/20 0550)    LOS: 1 day   Time spent: 51mn  Lilyanna Lunt C Ryla Cauthon, DO [facility]  If 7PM-7AM, please contact night-coverage [email or web address]  03/21/2020, 8:06 AM

## 2020-03-21 NOTE — Evaluation (Signed)
Clinical/Bedside Swallow Evaluation Patient Details  Name: Samantha Conrad MRN: [id number] Date of Birth: 1940-10-11  Today's Date: 03/21/2020 Time: SLP Start Time (ACUTE ONLY): 75 SLP Stop Time (ACUTE ONLY): 1100 SLP Time Calculation (min) (ACUTE ONLY): 20 min  Past Medical History:  Past Medical History:  Diagnosis Date  . Anemia   . Basal cell carcinoma     on nose/face  . CAD (coronary artery disease)    a. 08/2018: posterior STEMI s/p DES to LCx x2  . Depression   . ESRD on peritoneal dialysis ([place])    "q night" (11/29/2017)  . Fatty liver   . Hypertension   . Hypothyroidism   . NASH (nonalcoholic steatohepatitis)   . Seizures ([place])    ? when she may have had a stroke sometime in the spring of 2018  . Sleep apnea    diagnosed years ago "after I'd gained alot of weight;  no longer a problem since weight loss"  . Stroke [facility]) 04/2016   memory issues, lost 1/2 vision in both eyes  . Thrombocytopenia ([place])   . Thyroid disease   . Type II diabetes mellitus ([place])    Past Surgical History:  Past Surgical History:  Procedure Laterality Date  . ABDOMINAL HYSTERECTOMY    . AV FISTULA PLACEMENT Right 10/30/2016   Procedure: INSERTION OF ARTERIOVENOUS (AV) GORE-TEX GRAFT RIGHT UPPER ARM;  Surgeon: Elam Dutch, MD;  Location: [place];  Service: Vascular;  Laterality: Right;  . CATARACT EXTRACTION W/ INTRAOCULAR LENS  IMPLANT, BILATERAL Bilateral   . CHOLECYSTECTOMY OPEN    . CORNEAL TRANSPLANT Bilateral   . CORONARY ANGIOGRAPHY N/A 09/06/2018   Procedure: CORONARY ANGIOGRAPHY;  Surgeon: Lorretta Harp, MD;  Location: [place] [facility];  Service: Cardiovascular;  Laterality: N/A;  . CORONARY/GRAFT ACUTE MI REVASCULARIZATION N/A 09/06/2018   Procedure: Coronary/Graft Acute MI Revascularization;  Surgeon: Lorretta Harp, MD;  Location: [place] [facility];  Service: Cardiovascular;  Laterality: N/A;  . EYE SURGERY    . KNEE ARTHROSCOPY Left   . LEFT HEART CATH AND CORONARY  ANGIOGRAPHY N/A 09/06/2018   Procedure: LEFT HEART CATH AND CORONARY ANGIOGRAPHY;  Surgeon: Lorretta Harp, MD;  Location: [place] [facility];  Service: Cardiovascular;  Laterality: N/A;  . MOHS SURGERY     "nose"  . SHOULDER OPEN ROTATOR CUFF REPAIR Left   . THROMBECTOMY AND REVISION OF ARTERIOVENTOUS (AV) GORETEX  GRAFT Right 12/04/2016   Procedure: THROMBECTOMY/ REVISION OF RIGHT UPPER ARM ARTERIOVENOUS GORETEX GRAFT;  Surgeon: Elam Dutch, MD;  Location: [place];  Service: Vascular;  Laterality: Right;  . TONSILLECTOMY     HPI:  Patient is an 79 y.o. female with PMH: CAD s/p STEMI in 2020, CVA , DM, ESRD on peritoneal dialysis, HLD, hypothyroidism, LPR, seizure, thrombocytopenia who presented to hospital with fever and cough. Patient reported one week of cough and intermittent fever (as high as 103 at home). CXR at [facility] ED revealed PNA, but patient transferred to [facility] so she could still get her dialysis. CXR at [facility] showed left lower lobe infiltrate.   Assessment / Plan / Recommendation Clinical Impression  Patient presents with a mild pharyngeal dysphagia and likely esophageal component as she has prior diagnosis of LPR. Patient exhibited dry coughing throughout but this was not likely related to PO's as it mainly occured outside of PO intake. When sipping thin liquids from cup, patient swallowed twice for each sip indicating possible pharyngeal dysphagia. As patient  has an active PNA, h/o CVA and LPR, will proceed with MBS next date to r/o aspiration or penetration. Patient can remain on regular solids, thin liquids diet as suspect that if she is exhibiting a significant pharyngeal phase swallow impairment, it is likely chronic and not acute in nature. SLP Visit Diagnosis: Dysphagia, unspecified (R13.10)    Aspiration Risk  Mild aspiration risk    Diet Recommendation Thin liquid;Regular   Liquid Administration via: Cup;Straw Medication Administration: Whole meds  with liquid Supervision: Patient able to self feed Compensations: Minimize environmental distractions;Slow rate;Small sips/bites Postural Changes: Seated upright at 90 degrees;Remain upright for at least 30 minutes after po intake    Other  Recommendations Oral Care Recommendations: Oral care BID   Follow up Recommendations Other (comment) (TBD, likely none)      Frequency and Duration min 1 x/week  1 week       Prognosis Prognosis for Safe Diet Advancement: Good      Swallow Study   General Date of Onset: 03/19/20 HPI: Patient is an 79 y.o. female with PMH: CAD s/p STEMI in 2020, CVA , DM, ESRD on peritoneal dialysis, HLD, hypothyroidism, LPR, seizure, thrombocytopenia who presented to hospital with fever and cough. Patient reported one week of cough and intermittent fever (as high as 103 at home). CXR at [facility] ED revealed PNA, but patient transferred to [facility] so she could still get her dialysis. CXR at [facility] showed left lower lobe infiltrate. Type of Study: Bedside Swallow Evaluation Previous Swallow Assessment: none present Diet Prior to this Study: Regular;Thin liquids Temperature Spikes Noted: No Respiratory Status: Room air History of Recent Intubation: No Behavior/Cognition: Alert;Cooperative;Pleasant mood Oral Cavity Assessment: Within Functional Limits Oral Care Completed by SLP: No Oral Cavity - Dentition: Adequate natural dentition Vision: Functional for self-feeding Self-Feeding Abilities: Able to feed self Patient Positioning: Upright in chair Baseline Vocal Quality: Normal Volitional Cough: Strong Volitional Swallow: Able to elicit    Oral/Motor/Sensory Function Overall Oral Motor/Sensory Function: Within functional limits   Ice Chips     Thin Liquid Thin Liquid: Impaired Presentation: Self Fed;Cup Other Comments: patient observed with 2x swallow for every sip of liquids    Nectar Thick   NT  Honey Thick   NT  Puree   NT  Solid        NT    Sonia Baller, MA, CCC-SLP Speech Therapy [facility] Acute Rehab

## 2020-03-21 NOTE — Progress Notes (Signed)
[facility] Progress Note   Dialysis Orders:  Followed by Dr. [facility]- 7 days a week , EDW 63.5 kg, 2.5 calcium/0.5 magnesium dialysate with variable dextrose. 4 exchanges, 2250 L fill volume, 90-minute dwell time, last fill volume 0 cc, 0 daytime exchanges, 0 daytime dwell. Usually uses 2.5s - occ 4.25s  Recent labs hgb 8.4 - last MIrcera 200 10/22, 150 10/12 and 100 9.23 most recent tsat 29%  Baseline plts 60 - 80 K Meds: ^ glipizide 5 bid and torsemide 100 q am on 10/19 - family does pill box - she had a list of meds here that reflect this. sevelemer 1 ac  Assessment/Plan: 1. LLL PNA - cultured - emperic antibiotics per primary - COVID neg WBC better 2. ESRD -  CCPD - deferred last night - no acute need - plan routine orders this evening, resume torsemide. AM labs drawn ~3 am part way through [facility] K 3.4 net UF 389 cc 3. Hypokalemia - will not restrict diet - prone to low K at home -will give KCl 20 bid again  today and resume daily dose of 20 tomorrow 4. Hypertension/volume  - on coreg 3.125 bid and amlodipine 5 at HS - amlodipine not ordered here - hold for now - no weights except admission -some LE edema - use 4.25 for one of exchanges tonight and the remainder 2.5 5. Anemia  - hgb 8.1 hgb same range as outpatient - last Mircera 10/22- Fe adeqaute 6. Metabolic bone disease -  Reduce renvela to 1 ac - off calcitriol at present 7. Nutrition - changed to carb mod heart healthy to increase K offerings in diet/prosource/vits 8. Pancreatic insufficency - on creon 9. Hypothyroidism - on synthroid 10. CAD hx STEMI/ prior PCI 2020 11. Chronic thrombocytopenia - plts stable in usual range   Samantha Jacobson, PA-C [place] [phone] 03/21/2020,9:30 AM  LOS: 1 day   Subjective:   No problems with [facility] but coughed a lot last night.  Objective Vitals:   03/20/20 2009 03/20/20 2316 03/21/20 0240 03/21/20 0817  BP: (!)  149/58 (!) 136/56 (!) 148/53 (!) 134/51  Pulse: 74 79 87 89  Resp: 18 15 16 17   Temp: 99 F (37.2 C) 98.8 F (37.1 C) 98.7 F (37.1 C) 99.4 F (37.4 C)  TempSrc: Oral Oral Oral Oral  SpO2: 94% 90% 91% 93%  Weight:      Height:       Physical Exam General: NAD sitting in chair Heart: RRR Lungs: dim BS LLL some rales on right Abdomen: soft NT Extremities: + LE edema Dialysis Access: PD cath   Additional Objective Labs:  Intake/Output Summary (Last 24 hours) at 03/21/2020 1052 Last data filed at 03/21/2020 0600 Gross per 24 hour  Intake 9377 ml  Output 9416 ml  Net -39 ml    Basic Metabolic Panel: Recent Labs  Lab 03/19/20 1509 03/20/20 0323 03/21/20 0257  NA 135 137 135  K 3.4* 3.2* 3.4*  CL 96* 99 97*  CO2 25 24 23   GLUCOSE 253* 120* 310*  BUN 47* 56* 58*  CREATININE 5.76* 5.99* 6.07*  CALCIUM 9.1 8.9 8.7*  PHOS  --  2.4*  --    Liver Function Tests: Recent Labs  Lab 03/20/20 0323 03/21/20 0257  AST  --  22  ALT  --  20  ALKPHOS  --  92  BILITOT  --  0.6  PROT  --  5.7*  ALBUMIN 2.3* 2.4*   No results for input(s): LIPASE, AMYLASE in the last 168 hours. CBC: Recent Labs  Lab 03/19/20 1509 03/20/20 0323 03/21/20 0257  WBC 16.9* 12.5* 9.7  HGB 8.2* 7.8* 8.1*  HCT 26.7* 25.6* 26.2*  MCV 97.4 98.1 96.3  PLT 89* 72* 78*   Blood Culture    Component Value Date/Time   SDES BLOOD LEFT HAND 03/19/2020 2213   SPECREQUEST  03/19/2020 2213    BOTTLES DRAWN AEROBIC AND ANAEROBIC Blood Culture results may not be optimal due to an inadequate volume of blood received in culture bottles   CULT  03/19/2020 2213    NO GROWTH < 12 HOURS Performed at [facility] [address] [place], [place] [address]    REPTSTATUS PENDING 03/19/2020 2213    Cardiac Enzymes: No results for input(s): CKTOTAL, CKMB, CKMBINDEX, TROPONINI in the last 168 hours. CBG: No results for input(s): GLUCAP in the last 168 hours. Iron Studies: No results for input(s):  IRON, TIBC, TRANSFERRIN, FERRITIN in the last 72 hours. Lab Results  Component Value Date   INR 1.0 09/05/2018   INR 1.02 03/11/2017   Studies/Results: DG Chest 2 View  Result Date: 03/19/2020 CLINICAL DATA:  Dialysis patient with abnormal chest x-ray, subsequent encounter EXAM: CHEST - 2 VIEW COMPARISON:  Film from earlier in the same day. FINDINGS: Cardiac shadow is within normal limits. Patchy airspace opacity is noted in the left lower lobe, more linear when compared with the prior exam. No other focal infiltrate or effusion is seen. No acute bony abnormality is noted. IMPRESSION: Left lower lobe infiltrate relatively stable from the prior study. Electronically Signed   By: Inez Catalina M.D.   On: 03/19/2020 15:35   Medications: . azithromycin Stopped (03/21/20 0741)  . cefTRIAXone (ROCEPHIN)  IV Stopped (03/20/20 2139)  . dialysis solution 2.5% low-MG/low-CA Stopped (03/21/20 0550)   . (feeding supplement) PROSource Plus  30 mL Oral BID  . aspirin  81 mg Oral Daily  . atorvastatin  40 mg Oral q1800  . carvedilol  3.125 mg Oral BID WC  . citalopram  10 mg Oral Q breakfast  . clopidogrel  75 mg Oral Q breakfast  . docusate sodium  100 mg Oral Q lunch  . enoxaparin (LOVENOX) injection  30 mg Subcutaneous Q24H  . feeding supplement (NEPRO CARB STEADY)  237 mL Oral Q lunch  . gentamicin cream  1 application Topical Daily  . glipiZIDE  5 mg Oral QAC breakfast  . levETIRAcetam  250 mg Oral BID WC  . levothyroxine  100 mcg Oral QAC breakfast  . linagliptin  5 mg Oral Q breakfast  . lipase/protease/amylase  48,000 Units Oral TID WC  . multivitamin  1 tablet Oral Q breakfast  . pantoprazole  40 mg Oral Q breakfast  . potassium chloride  20 mEq Oral BID  . sevelamer carbonate  800 mg Oral TID WC  . sodium chloride flush  3 mL Intravenous Q12H  . torsemide  100 mg Oral Daily

## 2020-03-21 NOTE — Plan of Care (Signed)
  Problem: Education: Goal: Knowledge of General Education information will improve Description: Including pain rating scale, medication(s)/side effects and non-pharmacologic comfort measures Outcome: Progressing   Problem: Health Behavior/Discharge Planning: Goal: Ability to manage health-related needs will improve Outcome: Progressing   Problem: Activity: Goal: Risk for activity intolerance will decrease Outcome: Progressing   Problem: Nutrition: Goal: Adequate nutrition will be maintained Outcome: Progressing   Problem: Elimination: Goal: Will not experience complications related to bowel motility Outcome: Progressing Goal: Will not experience complications related to urinary retention Outcome: Progressing   Problem: Pain Managment: Goal: General experience of comfort will improve Outcome: Progressing

## 2020-03-22 ENCOUNTER — Inpatient Hospital Stay (HOSPITAL_COMMUNITY): Payer: Medicare Other

## 2020-03-22 LAB — COMPREHENSIVE METABOLIC PANEL
ALT: 17 U/L (ref 0–44)
AST: 17 U/L (ref 15–41)
Albumin: 2.1 g/dL — ABNORMAL LOW (ref 3.5–5.0)
Alkaline Phosphatase: 88 U/L (ref 38–126)
Anion gap: 14 (ref 5–15)
BUN: 54 mg/dL — ABNORMAL HIGH (ref 8–23)
CO2: 23 mmol/L (ref 22–32)
Calcium: 8.3 mg/dL — ABNORMAL LOW (ref 8.9–10.3)
Chloride: 99 mmol/L (ref 98–111)
Creatinine, Ser: 5.81 mg/dL — ABNORMAL HIGH (ref 0.44–1.00)
GFR, Estimated: 7 mL/min — ABNORMAL LOW (ref >60)
Glucose, Bld: 341 mg/dL — ABNORMAL HIGH (ref 70–99)
Potassium: 3.4 mmol/L — ABNORMAL LOW (ref 3.5–5.1)
Sodium: 136 mmol/L (ref 135–145)
Total Bilirubin: 0.7 mg/dL (ref 0.3–1.2)
Total Protein: 5.5 g/dL — ABNORMAL LOW (ref 6.5–8.1)

## 2020-03-22 LAB — CBC
HCT: 25.1 % — ABNORMAL LOW (ref 36.0–46.0)
Hemoglobin: 7.7 g/dL — ABNORMAL LOW (ref 12.0–15.0)
MCH: 29.2 pg (ref 26.0–34.0)
MCHC: 30.7 g/dL (ref 30.0–36.0)
MCV: 95.1 fL (ref 80.0–100.0)
Platelets: 75 10*3/uL — ABNORMAL LOW (ref 150–400)
RBC: 2.64 MIL/uL — ABNORMAL LOW (ref 3.87–5.11)
RDW: 15 % (ref 11.5–15.5)
WBC: 10.1 10*3/uL (ref 4.0–10.5)
nRBC: 0 % (ref 0.0–0.2)

## 2020-03-22 MED ORDER — CEPHALEXIN 250 MG PO CAPS: 250.0000 mg | ORAL_CAPSULE | Freq: Two times a day (BID) | ORAL | 0 refills | Status: AC

## 2020-03-22 MED ORDER — GENTAMICIN SULFATE 0.1 % EX CREA
1.0000 "application " | TOPICAL_CREAM | Freq: Every day | CUTANEOUS | Status: DC
  Filled 2020-03-22: qty 15

## 2020-03-22 MED ORDER — AZITHROMYCIN 500 MG PO TABS
500.0000 mg | ORAL_TABLET | Freq: Every day | ORAL | Status: DC
  Filled 2020-03-22: qty 1

## 2020-03-22 MED ORDER — AZITHROMYCIN 250 MG PO TABS: ORAL_TABLET | ORAL | 0 refills | Status: DC

## 2020-03-22 MED ORDER — DELFLEX-LC/2.5% DEXTROSE 394 MOSM/L IP SOLN: INTRAPERITONEAL | Status: DC

## 2020-03-22 NOTE — Progress Notes (Signed)
[facility] Progress Note   Subjective:  Seen in room - has already finished up with her [facility] this morning, net UF 486m. Mild cough overnight, breathing much improved this morning but hasn't been out of bed yet.  Objective Vitals:   03/21/20 1636 03/21/20 2028 03/22/20 0425 03/22/20 0808  BP: (!) 130/58 (!) 151/66 127/65 (!) 113/50  Pulse: 81 83 75 76  Resp: 16 18 15 17   Temp: 99.5 F (37.5 C) 98.5 F (36.9 C) 98.2 F (36.8 C) 98.5 F (36.9 C)  TempSrc: Oral Oral Oral Oral  SpO2: 93% 94% 91% 91%  Weight:      Height:       Physical Exam General: Well appearing, NAD.  Heart: RRR; no murmur Lungs: CTAB Abdomen: soft, non-tender Extremities: Trace LE edema Dialysis Access:  PD cath in abdomen, non-tender  Additional Objective Labs: Basic Metabolic Panel: Recent Labs  Lab 03/20/20 0323 03/21/20 0257 03/22/20 0139  NA 137 135 136  K 3.2* 3.4* 3.4*  CL 99 97* 99  CO2 24 23 23   GLUCOSE 120* 310* 341*  BUN 56* 58* 54*  CREATININE 5.99* 6.07* 5.81*  CALCIUM 8.9 8.7* 8.3*  PHOS 2.4*  --   --    Liver Function Tests: Recent Labs  Lab 03/20/20 0323 03/21/20 0257 03/22/20 0139  AST  --  22 17  ALT  --  20 17  ALKPHOS  --  92 88  BILITOT  --  0.6 0.7  PROT  --  5.7* 5.5*  ALBUMIN 2.3* 2.4* 2.1*  CBC: Recent Labs  Lab 03/19/20 1509 03/19/20 1509 03/20/20 0323 03/21/20 0257 03/22/20 0139  WBC 16.9*   < > 12.5* 9.7 10.1  HGB 8.2*   < > 7.8* 8.1* 7.7*  HCT 26.7*   < > 25.6* 26.2* 25.1*  MCV 97.4  --  98.1 96.3 95.1  PLT 89*   < > 72* 78* 75*   < > = values in this interval not displayed.   Medications: . cefTRIAXone (ROCEPHIN)  IV 2 g (03/21/20 2205)  . dialysis solution 2.5% low-MG/low-CA Stopped (03/21/20 0550)   . (feeding supplement) PROSource Plus  30 mL Oral BID  . aspirin  81 mg Oral Daily  . atorvastatin  40 mg Oral q1800  . azithromycin  500 mg Oral QHS  . carvedilol  3.125 mg Oral BID WC  . citalopram  10 mg Oral Q breakfast   . clopidogrel  75 mg Oral Q breakfast  . docusate sodium  100 mg Oral Q lunch  . feeding supplement (NEPRO CARB STEADY)  237 mL Oral Q lunch  . gentamicin cream  1 application Topical Daily  . glipiZIDE  5 mg Oral QAC breakfast  . heparin injection (subcutaneous)  5,000 Units Subcutaneous Q8H  . levETIRAcetam  250 mg Oral BID WC  . levothyroxine  100 mcg Oral QAC breakfast  . linagliptin  5 mg Oral Q breakfast  . lipase/protease/amylase  48,000 Units Oral TID WC  . multivitamin  1 tablet Oral Q breakfast  . pantoprazole  40 mg Oral Q breakfast  . potassium chloride  20 mEq Oral Daily  . prednisoLONE acetate  1 drop Both Eyes QHS  . sevelamer carbonate  800 mg Oral TID WC  . sodium chloride flush  3 mL Intravenous Q12H  . torsemide  100 mg Oral Daily    Dialysis Orders: [facility]- 7 days a week, EDW63.5 kg, 2.5  calcium/0.5 magnesium dialysate with variable dextrose. 4 exchanges, 2250L fill volume, 90-minute dwell time, last fill volume 0 cc, 0 daytime exchanges, 0 daytime dwell. Usually uses 2.5s - occ 4.25s  Recent labs hgb 8.4 - last Mircera 200 10/22, 150 10/12 and 100 9.23 most recent tsat 29%  Baseline plts 60 - 80 K Meds: ^ glipizide 5 bid and torsemide 100 q amon 10/19 - family does pill box - she had a list of meds here that reflect this.sevelemer 1 ac  Assessment/Plan: 1. LLL PNA: COVID/Flu negative. On Ceftriaxone + Azithromycin.  2. ESRD: Continue nightly [facility]. Net UF 476m overnight. Volume appears stable. 3. Hypokalemia: Continue daily KCl 27m for now. 4. Hypertension/volume: BP low sided. On Coreg 3.125 BID and amlodipine 23m20mHS at home - amlodipine on hold here for now. Used 1 bag 4.25% last night, then rest 2.5% - will use all 2.5% tonight.  5. Anemia of ESRD: Hgb 7.7 - last Mircera 10/22, not due for next dose yet. Last tsat adequate. 6. Metabolic bone disease: Ca ok, Phos low side - Renvela reduced to 1/meals. No  VDRA.  7. Nutrition: Regular diet for now, no K restrictions. 8. Pancreatic insufficency - on creon 9. Hypothyroidism- on synthroid 10. CAD hx STEMI/ prior PCI 2020 11. Chronic thrombocytopenia - plts stable in usual range   KatVeneta PentonA-C 03/22/2020, 9:24 AM  [employer]

## 2020-03-22 NOTE — Progress Notes (Signed)
PT Cancellation Note  Patient Details Name: Samantha Conrad MRN: [id number] DOB: 04-Aug-1940   Cancelled Treatment:    Reason Eval/Treat Not Completed: Patient at procedure or test/unavailable Patient off unit with radiology. PT will re-attempt at time allows.   Perrin Maltese, PT, DPT Acute Rehabilitation Services Pager [phone] Office [phone]    Alda Lea 03/22/2020, 1:55 PM

## 2020-03-22 NOTE — Care Management Important Message (Signed)
Important Message  Patient Details  Name: Samantha Conrad MRN: [id number] Date of Birth: 03/12/1941   Medicare Important Message Given:  Yes - Important Message mailed due to current National Emergency  Verbal consent obtained due to current National Emergency  Relationship to patient: Self Contact Name: Seena Ritacco Call Date: 03/22/20  Time: 1529 Phone: [phone] Outcome: Spoke with contact Important Message mailed to: Patient address on file    Delorse Lek 03/22/2020, 3:29 PM

## 2020-03-22 NOTE — Discharge Summary (Signed)
Physician Discharge Summary  TEARSA KOWALEWSKI GNO:[id number] DOB: 07/08/1940 DOA: 03/19/2020  PCP: [facility] [place] date: 03/19/2020 Discharge date: 03/22/2020  Admitted From: Home Disposition: Home  Recommendations for Outpatient Follow-up:  1. Follow up with PCP in 1-2 weeks to ensure resolution of pneumonia, repeat imaging and labs per their expertise  Home Health: None Equipment/Devices: None  Discharge Condition: Stable CODE STATUS: DNR Diet recommendation: Regular diet as tolerated  Brief/Interim Summary: Malcolm Metro Lassiteris a 79 y.o.femalewith medical history significant ofCAD status post STEMI in 2020 and subsequent DES x2, chronic pancreatitis, diabetes, ESRD on peritoneal dialysis, hyperlipidemia, hypothyroidism, LPR, seizure, thrombocytopenia who presents with fever and cough. Patient states she has had 1 week of cough and intermittent fever as high as 103. The home health individual who came to do her peritoneal dialysis today told the patient that she was concerned about the fever and recommend that she go to the ED. Patient was seen in the [facility] ED where she had a chest x-ray done which showed pneumonia, however the patient was instructed to come to [facility] as they would be unable to assist her with peritoneal dialysis if admitted there. Patient denies shortness of breath, chest pain.Reports some mild abdominal discomfort since arriving in our ED. In ED: labs showed hypokalemia at 3.4 and creatinine elevated to 5.76 consistent with ESRD. Glucose noted to be elevated to 53. Hemoglobin 8.2 which is stable for her, platelets 89 which is mildly reduced from her baseline thrombocytopenia of 110, and leukocytosis to 16. Troponins were flat with initial troponin of 35 and repeat 36. Lactic acid, blood cultures, respiratory panel were ordered but not back at the time of admission. EKG showed sinus rhythm with first-degree AV block, PVCs. Chest x-ray  showed left lower lobe infiltrate. Patient received ceftriaxone and azithromycin in the ED -hospitalist called for admission.  Patient met as above with acute respiratory distress in the setting of aspiration pneumonia, patient received IV antibiotics, notably without hypoxia but markedly dyspneic even with minimal exertion.  Patient was evaluated by speech, formal evaluation today shows no aspiration, and otherwise unremarkable.  At this time patient is otherwise stable, PT evaluating indicated patient needs no further evaluation, will discharge on remainder of antibiotics with azithromycin and Keflex with close follow-up with PCP later this week or early next week to ensure resolution of pneumonia to further evaluate for possible aspiration but again unlikely given our findings here.   Discharge Diagnoses:  Active Problems:   Laryngopharyngeal reflux (LPR)   Diet-controlled diabetes mellitus (HCC)   Hypothyroidism   ESRD on dialysis ([place])   Seizure disorder   HLD (hyperlipidemia)   Chronic pancreatitis (HCC)   CAD (coronary artery disease)   Anemia   Thrombocytopenia (HCC)   PNA (pneumonia)    Discharge Instructions  Discharge Instructions    Call MD for:  difficulty breathing, headache or visual disturbances   Complete by: As directed    Call MD for:  temperature >100.4   Complete by: As directed    Diet - low sodium heart healthy   Complete by: As directed    Increase activity slowly   Complete by: As directed    No wound care   Complete by: As directed      Allergies as of 03/22/2020      Reactions   Contrast Media [iodinated Diagnostic Agents] Swelling   SWELLING REACTION UNSPECIFIED    Tape Other (See Comments), Dermatitis   Tears skin - please  use paper tape Paper tape only      Medication List    TAKE these medications   acetaminophen-codeine 300-30 MG tablet Commonly known as: TYLENOL #3 Take 1-2 tablets by mouth every 6 (six) hours as needed for moderate  pain.   amLODipine 5 MG tablet Commonly known as: NORVASC Take 1 tablet (5 mg total) by mouth daily. What changed: when to take this   aspirin 81 MG chewable tablet Chew 1 tablet (81 mg total) by mouth daily.   atorvastatin 40 MG tablet Commonly known as: LIPITOR TAKE 1 TABLET (40 MG TOTAL) BY MOUTH DAILY AT 6 PM.   azithromycin 250 MG tablet Commonly known as: Zithromax Take 1 tablet PO daily until finished   carvedilol 3.125 MG tablet Commonly known as: COREG TAKE 1 TABLET (3.125 MG TOTAL) BY MOUTH 2 (TWO) TIMES DAILY WITH A MEAL.   cephALEXin 250 MG capsule Commonly known as: KEFLEX Take 1 capsule (250 mg total) by mouth 2 (two) times daily for 3 days.   citalopram 10 MG tablet Commonly known as: CELEXA Take 10 mg by mouth daily with breakfast.   clopidogrel 75 MG tablet Commonly known as: PLAVIX TAKE 1 TABLET (75 MG TOTAL) BY MOUTH DAILY WITH BREAKFAST.   Creon 12000-38000 units Cpep capsule Generic drug: lipase/protease/amylase Take 48,000 Units by mouth 3 (three) times daily with meals.   docusate sodium 100 MG capsule Commonly known as: COLACE Take 100 mg by mouth daily with lunch.   feeding supplement (NEPRO CARB STEADY) Liqd Take 237 mLs by mouth daily. What changed: when to take this   feeding supplement (PRO-STAT SUGAR FREE 64) Liqd Take 30 mLs by mouth 2 (two) times daily.   gentamicin cream 0.1 % Commonly known as: GARAMYCIN Apply 1 application topically daily as needed.   glipiZIDE 5 MG tablet Commonly known as: GLUCOTROL Take 5 mg by mouth 2 (two) times daily before a meal.   lactulose (encephalopathy) 10 GM/15ML Soln Commonly known as: CHRONULAC Take 30 g by mouth once.   levETIRAcetam 250 MG tablet Commonly known as: KEPPRA Take 250 mg by mouth 2 (two) times daily with a meal.   levothyroxine 100 MCG tablet Commonly known as: SYNTHROID Take 100 mcg by mouth daily before breakfast.   linagliptin 5 MG Tabs tablet Commonly known as:  TRADJENTA Take 5 mg by mouth daily with breakfast.   MIRCERA IJ Inject into the skin.   multivitamin Tabs tablet Take 1 tablet by mouth daily. What changed: when to take this   nitroGLYCERIN 0.4 MG SL tablet Commonly known as: NITROSTAT Place 0.4 mg under the tongue every 5 (five) minutes as needed for chest pain.   pantoprazole 40 MG tablet Commonly known as: PROTONIX Take 40 mg by mouth daily with breakfast.   polyethylene glycol 17 g packet Commonly known as: MIRALAX / GLYCOLAX Take 17 g by mouth daily.   potassium chloride SA 20 MEQ tablet Commonly known as: KLOR-CON Take 1 tablet (20 mEq total) by mouth 2 (two) times daily. What changed: when to take this   prednisoLONE acetate 1 % ophthalmic suspension Commonly known as: PRED FORTE Place 1 drop into both eyes at bedtime.   sevelamer carbonate 800 MG tablet Commonly known as: RENVELA Take 800 mg by mouth 3 (three) times daily with meals.   torsemide 100 MG tablet Commonly known as: DEMADEX Take 100 mg by mouth daily.   traZODone 50 MG tablet Commonly known as: DESYREL Take 50 mg by mouth  at bedtime.       Allergies  Allergen Reactions  . Contrast Media [Iodinated Diagnostic Agents] Swelling    SWELLING REACTION UNSPECIFIED   . Tape Other (See Comments) and Dermatitis    Tears skin - please use paper tape Paper tape only    Consultations:  None   Procedures/Studies: DG Chest 2 View  Result Date: 03/19/2020 CLINICAL DATA:  Dialysis patient with abnormal chest x-ray, subsequent encounter EXAM: CHEST - 2 VIEW COMPARISON:  Film from earlier in the same day. FINDINGS: Cardiac shadow is within normal limits. Patchy airspace opacity is noted in the left lower lobe, more linear when compared with the prior exam. No other focal infiltrate or effusion is seen. No acute bony abnormality is noted. IMPRESSION: Left lower lobe infiltrate relatively stable from the prior study. Electronically Signed   By: Inez Catalina M.D.   On: 03/19/2020 15:35     Subjective: No acute issues or events overnight, otherwise stable for discharge   Discharge Exam: Vitals:   03/22/20 0425 03/22/20 0808  BP: 127/65 (!) 113/50  Pulse: 75 76  Resp: 15 17  Temp: 98.2 F (36.8 C) 98.5 F (36.9 C)  SpO2: 91% 91%   Vitals:   03/21/20 1636 03/21/20 2028 03/22/20 0425 03/22/20 0808  BP: (!) 130/58 (!) 151/66 127/65 (!) 113/50  Pulse: 81 83 75 76  Resp: 16 18 15 17   Temp: 99.5 F (37.5 C) 98.5 F (36.9 C) 98.2 F (36.8 C) 98.5 F (36.9 C)  TempSrc: Oral Oral Oral Oral  SpO2: 93% 94% 91% 91%  Weight:      Height:        General: Pt is alert, awake, not in acute distress Cardiovascular: RRR, S1/S2 +, no rubs, no gallops Respiratory: CTA bilaterally, no wheezing, no rhonchi Abdominal: Soft, NT, ND, bowel sounds + Extremities: no edema, no cyanosis    The results of significant diagnostics from this hospitalization (including imaging, microbiology, ancillary and laboratory) are listed below for reference.     Microbiology: Recent Results (from the past 240 hour(s))  Blood culture (routine x 2)     Status: None (Preliminary result)   Collection Time: 03/19/20 10:13 PM   Specimen: BLOOD LEFT HAND  Result Value Ref Range Status   Specimen Description BLOOD LEFT HAND  Final   Special Requests   Final    BOTTLES DRAWN AEROBIC AND ANAEROBIC Blood Culture results may not be optimal due to an inadequate volume of blood received in culture bottles   Culture   Final    NO GROWTH 3 DAYS Performed at [facility], [place] [address], [place], [place] [address]    Report Status PENDING  Incomplete  Respiratory Panel by RT PCR (Flu A&B, Covid) - Nasopharyngeal Swab     Status: None   Collection Time: 03/19/20 10:47 PM   Specimen: Nasopharyngeal Swab  Result Value Ref Range Status   SARS Coronavirus 2 by RT PCR NEGATIVE NEGATIVE Final    Comment: (NOTE) SARS-CoV-2 target nucleic acids are NOT  DETECTED.  The SARS-CoV-2 RNA is generally detectable in upper respiratoy specimens during the acute phase of infection. The lowest concentration of SARS-CoV-2 viral copies this assay can detect is 131 copies/mL. A negative result does not preclude SARS-Cov-2 infection and should not be used as the sole basis for treatment or other patient management decisions. A negative result may occur with  improper specimen collection/handling, submission of specimen other than nasopharyngeal swab, presence of  viral mutation(s) within the areas targeted by this assay, and inadequate number of viral copies (<131 copies/mL). A negative result must be combined with clinical observations, patient history, and epidemiological information. The expected result is Negative.  Fact Sheet for Patients:  PinkCheek.be  Fact Sheet for Healthcare Providers:  GravelBags.it  This test is no t yet approved or cleared by the [place] FDA and  has been authorized for detection and/or diagnosis of SARS-CoV-2 by FDA under an Emergency Use Authorization (EUA). This EUA will remain  in effect (meaning this test can be used) for the duration of the COVID-19 declaration under Section 564(b)(1) of the Act, 21 U.S.C. section 360bbb-3(b)(1), unless the authorization is terminated or revoked sooner.     Influenza A by PCR NEGATIVE NEGATIVE Final   Influenza B by PCR NEGATIVE NEGATIVE Final    Comment: (NOTE) The Xpert Xpress SARS-CoV-2/FLU/RSV assay is intended as an aid in  the diagnosis of influenza from Nasopharyngeal swab specimens and  should not be used as a sole basis for treatment. Nasal washings and  aspirates are unacceptable for Xpert Xpress SARS-CoV-2/FLU/RSV  testing.  Fact Sheet for Patients: PinkCheek.be  Fact Sheet for Healthcare Providers: GravelBags.it  This test is not yet  approved or cleared by the [place] FDA and  has been authorized for detection and/or diagnosis of SARS-CoV-2 by  FDA under an Emergency Use Authorization (EUA). This EUA will remain  in effect (meaning this test can be used) for the duration of the  Covid-19 declaration under Section 564(b)(1) of the Act, 21  U.S.C. section 360bbb-3(b)(1), unless the authorization is  terminated or revoked. Performed at [facility], [place] [address] [place], [place] [address]      Labs: BNP (last 3 results) No results for input(s): BNP in the last 8760 hours. Basic Metabolic Panel: Recent Labs  Lab 03/19/20 1509 03/20/20 0323 03/21/20 0257 03/22/20 0139  NA 135 137 135 136  K 3.4* 3.2* 3.4* 3.4*  CL 96* 99 97* 99  CO2 25 24 23 23   GLUCOSE 253* 120* 310* 341*  BUN 47* 56* 58* 54*  CREATININE 5.76* 5.99* 6.07* 5.81*  CALCIUM 9.1 8.9 8.7* 8.3*  PHOS  --  2.4*  --   --    Liver Function Tests: Recent Labs  Lab 03/20/20 0323 03/21/20 0257 03/22/20 0139  AST  --  22 17  ALT  --  20 17  ALKPHOS  --  92 88  BILITOT  --  0.6 0.7  PROT  --  5.7* 5.5*  ALBUMIN 2.3* 2.4* 2.1*   No results for input(s): LIPASE, AMYLASE in the last 168 hours. No results for input(s): AMMONIA in the last 168 hours. CBC: Recent Labs  Lab 03/19/20 1509 03/20/20 0323 03/21/20 0257 03/22/20 0139  WBC 16.9* 12.5* 9.7 10.1  HGB 8.2* 7.8* 8.1* 7.7*  HCT 26.7* 25.6* 26.2* 25.1*  MCV 97.4 98.1 96.3 95.1  PLT 89* 72* 78* 75*   Cardiac Enzymes: No results for input(s): CKTOTAL, CKMB, CKMBINDEX, TROPONINI in the last 168 hours. BNP: Invalid input(s): POCBNP CBG: No results for input(s): GLUCAP in the last 168 hours. D-Dimer No results for input(s): DDIMER in the last 72 hours. Hgb A1c No results for input(s): HGBA1C in the last 72 hours. Lipid Profile No results for input(s): CHOL, HDL, LDLCALC, TRIG, CHOLHDL, LDLDIRECT in the last 72 hours. Thyroid function studies No results for input(s):  TSH, T4TOTAL, T3FREE, THYROIDAB in the last 72 hours.  Invalid input(s): FREET3 Anemia work up No results for input(s): VITAMINB12, FOLATE, FERRITIN, TIBC, IRON, RETICCTPCT in the last 72 hours. Urinalysis    Component Value Date/Time   COLORURINE YELLOW 09/13/2018 1554   APPEARANCEUR CLEAR 09/13/2018 1554   LABSPEC 1.008 09/13/2018 1554   PHURINE 9.0 (H) 09/13/2018 1554   GLUCOSEU 150 (A) 09/13/2018 1554   HGBUR NEGATIVE 09/13/2018 [place] 09/13/2018 1554   KETONESUR NEGATIVE 09/13/2018 1554   PROTEINUR 30 (A) 09/13/2018 1554   NITRITE NEGATIVE 09/13/2018 1554   LEUKOCYTESUR NEGATIVE 09/13/2018 1554   Sepsis Labs Invalid input(s): PROCALCITONIN,  WBC,  LACTICIDVEN Microbiology Recent Results (from the past 240 hour(s))  Blood culture (routine x 2)     Status: None (Preliminary result)   Collection Time: 03/19/20 10:13 PM   Specimen: BLOOD LEFT HAND  Result Value Ref Range Status   Specimen Description BLOOD LEFT HAND  Final   Special Requests   Final    BOTTLES DRAWN AEROBIC AND ANAEROBIC Blood Culture results may not be optimal due to an inadequate volume of blood received in culture bottles   Culture   Final    NO GROWTH 3 DAYS Performed at [facility], [place] [address], [place], [place] [address]    Report Status PENDING  Incomplete  Respiratory Panel by RT PCR (Flu A&B, Covid) - Nasopharyngeal Swab     Status: None   Collection Time: 03/19/20 10:47 PM   Specimen: Nasopharyngeal Swab  Result Value Ref Range Status   SARS Coronavirus 2 by RT PCR NEGATIVE NEGATIVE Final    Comment: (NOTE) SARS-CoV-2 target nucleic acids are NOT DETECTED.  The SARS-CoV-2 RNA is generally detectable in upper respiratoy specimens during the acute phase of infection. The lowest concentration of SARS-CoV-2 viral copies this assay can detect is 131 copies/mL. A negative result does not preclude SARS-Cov-2 infection and should not be used as the sole basis for  treatment or other patient management decisions. A negative result may occur with  improper specimen collection/handling, submission of specimen other than nasopharyngeal swab, presence of viral mutation(s) within the areas targeted by this assay, and inadequate number of viral copies (<131 copies/mL). A negative result must be combined with clinical observations, patient history, and epidemiological information. The expected result is Negative.  Fact Sheet for Patients:  PinkCheek.be  Fact Sheet for Healthcare Providers:  GravelBags.it  This test is no t yet approved or cleared by the [place] FDA and  has been authorized for detection and/or diagnosis of SARS-CoV-2 by FDA under an Emergency Use Authorization (EUA). This EUA will remain  in effect (meaning this test can be used) for the duration of the COVID-19 declaration under Section 564(b)(1) of the Act, 21 U.S.C. section 360bbb-3(b)(1), unless the authorization is terminated or revoked sooner.     Influenza A by PCR NEGATIVE NEGATIVE Final   Influenza B by PCR NEGATIVE NEGATIVE Final    Comment: (NOTE) The Xpert Xpress SARS-CoV-2/FLU/RSV assay is intended as an aid in  the diagnosis of influenza from Nasopharyngeal swab specimens and  should not be used as a sole basis for treatment. Nasal washings and  aspirates are unacceptable for Xpert Xpress SARS-CoV-2/FLU/RSV  testing.  Fact Sheet for Patients: PinkCheek.be  Fact Sheet for Healthcare Providers: GravelBags.it  This test is not yet approved or cleared by the [place] FDA and  has been authorized for detection and/or diagnosis of SARS-CoV-2 by  FDA under an Emergency Use Authorization (EUA). This  EUA will remain  in effect (meaning this test can be used) for the duration of the  Covid-19 declaration under Section 564(b)(1) of the Act, 21   U.S.C. section 360bbb-3(b)(1), unless the authorization is  terminated or revoked. Performed at [facility], [place] [address] [place], [place] [address]      Time coordinating discharge: Over 30 minutes  SIGNED:   Little Ishikawa, DO [facility] 03/22/2020, 2:52 PM Pager   If 7PM-7AM, please contact night-coverage [email or web address]

## 2020-03-22 NOTE — Progress Notes (Signed)
Modified Barium Swallow Progress Note  Patient Details  Name: Samantha Conrad MRN: [id number] Date of Birth: 03-13-1941  Today's Date: 03/22/2020  Modified Barium Swallow completed.  Full report located under Chart Review in the Imaging Section.  Brief recommendations include the following:  Clinical Impression  Patient presents with normal oral phase and mild pharyngeal phase dysphagia without aspiration or penetration and with full clearance of all tested bolus consistencies (thin, puree, regular, barium tablet) through pharynx. She exhibited mild delay in swallow initiation at level of vallecular sinus for all consistencies and trace vallecular residuals post initial swallow but that cleared with subsequent, uncued swallows. All boluses transited through UES without difficulty and no evidence of retrograde movement of boluses observed.   Swallow Evaluation Recommendations       SLP Diet Recommendations: Regular solids;Thin liquid   Liquid Administration via: Cup;Straw   Medication Administration: Whole meds with liquid   Supervision: Patient able to self feed       Postural Changes: Seated upright at 90 degrees   Oral Care Recommendations: Oral care BID    Sonia Baller, MA, [facility]

## 2020-03-22 NOTE — Progress Notes (Signed)
Pt is ambulating in the room with steady gait. Non-productive cough noted but verbalized of feeling a lot better today. Denies SOB. Discharge instructions given to pt. Discharged to home.

## 2020-03-24 LAB — CULTURE, BLOOD (ROUTINE X 2): Culture: NO GROWTH

## 2020-07-19 ENCOUNTER — Ambulatory Visit: Payer: Medicare Other | Admitting: Cardiovascular Disease

## 2020-07-27 ENCOUNTER — Other Ambulatory Visit: Payer: Self-pay

## 2020-07-27 ENCOUNTER — Ambulatory Visit: Payer: Medicare Other | Admitting: Cardiology

## 2020-07-27 ENCOUNTER — Encounter: Payer: Self-pay | Admitting: Cardiology

## 2020-07-27 DIAGNOSIS — E785 Hyperlipidemia, unspecified: Secondary | ICD-10-CM

## 2020-07-27 DIAGNOSIS — Z992 Dependence on renal dialysis: Secondary | ICD-10-CM

## 2020-07-27 DIAGNOSIS — I2510 Atherosclerotic heart disease of native coronary artery without angina pectoris: Secondary | ICD-10-CM

## 2020-07-27 DIAGNOSIS — I10 Essential (primary) hypertension: Secondary | ICD-10-CM

## 2020-07-27 DIAGNOSIS — Z9861 Coronary angioplasty status: Secondary | ICD-10-CM

## 2020-07-27 DIAGNOSIS — I252 Old myocardial infarction: Secondary | ICD-10-CM | POA: Diagnosis not present

## 2020-07-27 DIAGNOSIS — N186 End stage renal disease: Secondary | ICD-10-CM | POA: Diagnosis not present

## 2020-07-27 NOTE — Assessment & Plan Note (Signed)
April 2020

## 2020-07-27 NOTE — Progress Notes (Signed)
Cardiology Office Note:    Date:  07/27/2020   ID:  Samantha Conrad, DOB [birth year], MRN [id number]  PCP:  [facility]  Cardiologist:  Quay Burow, MD  Electrophysiologist:  None   Referring MD: [facility] [place]   No chief complaint on file.   History of Present Illness:    Samantha Conrad is a 80 y.o. female with a hx of CAD, hypertension, dyslipidemia, and end-stage renal disease on peritoneal dialysis.  Patient presented in April 2020 with an acute STEMI.  At cath she was found to have an occlusion of the proximal circumflex as well as continuation into an AV groove branch.  LAD and RCA were essentially normal.  She underwent PCI with DES to the proximal circumflex with a good result but had a suboptimal result in the AV groove with a residual narrowing of 50%.  Fortunately she is done well from a cardiac standpoint since.  Echocardiogram at that time showed an ejection fraction of 50 to 55%.  Patient seen in the office today for routine follow-up.  She was admitted in October 2021 with community-acquired pneumonia.  She is living in her own apartment, she is on peritoneal dialysis.  Her children live nearby.  Her son Samantha Conrad accompanied her to the office today, he manages her medications.  Patient denies any chest pain.  She remembers what her symptoms were like when she presented with her heart attack and she has not had anything like that.  The patient also sees Dr. Melina Copa in [place].  He mentioned to the family that the patient is still on aspirin and Plavix.  She apparently had some ulcers and GI bleeding in the past, the patient's son thinks this was prior to her intervention.  This is been stable since she has been on Protonix but Dr. Melina Copa wondered if possibly the aspirin could be dropped.  I discussed this with Dr. Claiborne Billings.  Past Medical History:  Diagnosis Date  . Anemia   . Basal cell carcinoma     on nose/face  . CAD (coronary artery disease)     a. 08/2018: posterior STEMI s/p DES to LCx x2  . Depression   . ESRD on peritoneal dialysis ([place])    "q night" (11/29/2017)  . Fatty liver   . Hypertension   . Hypothyroidism   . NASH (nonalcoholic steatohepatitis)   . Seizures ([place])    ? when she may have had a stroke sometime in the spring of 2018  . Sleep apnea    diagnosed years ago "after I'd gained alot of weight;  no longer a problem since weight loss"  . Stroke [facility]) 04/2016   memory issues, lost 1/2 vision in both eyes  . Thrombocytopenia ([place])   . Thyroid disease   . Type II diabetes mellitus ([place])     Past Surgical History:  Procedure Laterality Date  . ABDOMINAL HYSTERECTOMY    . AV FISTULA PLACEMENT Right 10/30/2016   Procedure: INSERTION OF ARTERIOVENOUS (AV) GORE-TEX GRAFT RIGHT UPPER ARM;  Surgeon: Elam Dutch, MD;  Location: [place];  Service: Vascular;  Laterality: Right;  . CATARACT EXTRACTION W/ INTRAOCULAR LENS  IMPLANT, BILATERAL Bilateral   . CHOLECYSTECTOMY OPEN    . CORNEAL TRANSPLANT Bilateral   . CORONARY ANGIOGRAPHY N/A 09/06/2018   Procedure: CORONARY ANGIOGRAPHY;  Surgeon: Lorretta Harp, MD;  Location: [place] CV LAB;  Service: Cardiovascular;  Laterality: N/A;  . CORONARY/GRAFT ACUTE MI REVASCULARIZATION N/A 09/06/2018  Procedure: Coronary/Graft Acute MI Revascularization;  Surgeon: Lorretta Harp, MD;  Location: [place] CV LAB;  Service: Cardiovascular;  Laterality: N/A;  . EYE SURGERY    . KNEE ARTHROSCOPY Left   . LEFT HEART CATH AND CORONARY ANGIOGRAPHY N/A 09/06/2018   Procedure: LEFT HEART CATH AND CORONARY ANGIOGRAPHY;  Surgeon: Lorretta Harp, MD;  Location: [place] CV LAB;  Service: Cardiovascular;  Laterality: N/A;  . MOHS SURGERY     "nose"  . SHOULDER OPEN ROTATOR CUFF REPAIR Left   . THROMBECTOMY AND REVISION OF ARTERIOVENTOUS (AV) GORETEX  GRAFT Right 12/04/2016   Procedure: THROMBECTOMY/ REVISION OF RIGHT UPPER ARM ARTERIOVENOUS GORETEX GRAFT;  Surgeon: Elam Dutch, MD;  Location: [place];  Service: Vascular;  Laterality: Right;  . TONSILLECTOMY      Current Medications: Current Meds  Medication Sig  . acetaminophen-codeine (TYLENOL #3) 300-30 MG tablet Take 1-2 tablets by mouth every 6 (six) hours as needed for moderate pain.  . Amino Acids-Protein Hydrolys (FEEDING SUPPLEMENT, PRO-STAT SUGAR FREE 64,) LIQD Take 30 mLs by mouth 2 (two) times daily.  Marland Kitchen amLODipine (NORVASC) 5 MG tablet Take 1 tablet (5 mg total) by mouth daily. (Patient taking differently: Take 5 mg by mouth at bedtime.)  . aspirin 81 MG chewable tablet Chew 1 tablet (81 mg total) by mouth daily.  Marland Kitchen atorvastatin (LIPITOR) 40 MG tablet TAKE 1 TABLET (40 MG TOTAL) BY MOUTH DAILY AT 6 PM.  . carvedilol (COREG) 3.125 MG tablet TAKE 1 TABLET (3.125 MG TOTAL) BY MOUTH 2 (TWO) TIMES DAILY WITH A MEAL.  . citalopram (CELEXA) 10 MG tablet Take 10 mg by mouth daily with breakfast.   . clopidogrel (PLAVIX) 75 MG tablet TAKE 1 TABLET (75 MG TOTAL) BY MOUTH DAILY WITH BREAKFAST.  Marland Kitchen docusate sodium (COLACE) 100 MG capsule Take 100 mg by mouth daily with lunch.  Marland Kitchen gentamicin cream (GARAMYCIN) 0.1 % Apply 1 application topically daily as needed.  Marland Kitchen glipiZIDE (GLUCOTROL) 5 MG tablet Take 5 mg by mouth 2 (two) times daily before a meal.   . lactulose, encephalopathy, (CHRONULAC) 10 GM/15ML SOLN Take 30 g by mouth once.  Marland Kitchen LANTUS SOLOSTAR 100 UNIT/ML Solostar Pen Inject 12 Units into the skin at bedtime.  . levETIRAcetam (KEPPRA) 250 MG tablet Take 250 mg by mouth 2 (two) times daily with a meal.   . levothyroxine (SYNTHROID, LEVOTHROID) 100 MCG tablet Take 100 mcg by mouth daily before breakfast.   . linagliptin (TRADJENTA) 5 MG TABS tablet Take 5 mg by mouth daily with breakfast.   . lipase/protease/amylase (CREON) 12000-38000 units CPEP capsule Take 48,000 Units by mouth 3 (three) times daily with meals.  . Methoxy PEG-Epoetin Beta (MIRCERA IJ) Inject into the skin.  . multivitamin (PROSIGHT)  TABS tablet Take 1 tablet by mouth daily. (Patient taking differently: Take 1 tablet by mouth daily with breakfast.)  . nitroGLYCERIN (NITROSTAT) 0.4 MG SL tablet Place 0.4 mg under the tongue every 5 (five) minutes as needed for chest pain.   . Nutritional Supplements (FEEDING SUPPLEMENT, NEPRO CARB STEADY,) LIQD Take 237 mLs by mouth daily. (Patient taking differently: Take 237 mLs by mouth daily with lunch.)  . pantoprazole (PROTONIX) 40 MG tablet Take 40 mg by mouth daily with breakfast.   . pioglitazone (ACTOS) 15 MG tablet Take 15 mg by mouth daily.  . polyethylene glycol (MIRALAX / GLYCOLAX) packet Take 17 g by mouth daily.  . potassium chloride SA (KLOR-CON) 20 MEQ tablet Take 20  mEq by mouth daily.  . prednisoLONE acetate (PRED FORTE) 1 % ophthalmic suspension Place 1 drop into both eyes at bedtime.   . sevelamer carbonate (RENVELA) 800 MG tablet Take 800 mg by mouth 3 (three) times daily with meals.   . torsemide (DEMADEX) 100 MG tablet Take 100 mg by mouth daily.  . traZODone (DESYREL) 50 MG tablet Take 50 mg by mouth at bedtime.     Allergies:   Contrast media [iodinated diagnostic agents] and Tape   Social History   Socioeconomic History  . Marital status: Widowed    Spouse name: Not on file  . Number of children: Not on file  . Years of education: Not on file  . Highest education level: Not on file  Occupational History  . Not on file  Tobacco Use  . Smoking status: Never Smoker  . Smokeless tobacco: Former Systems developer    Types: Snuff  . Tobacco comment: "no snuff since < 1980s"  Vaping Use  . Vaping Use: Never used  Substance and Sexual Activity  . Alcohol use: Never  . Drug use: Never  . Sexual activity: Not Currently  Other Topics Concern  . Not on file  Social History Narrative  . Not on file   Social Determinants of Health   Financial Resource Strain: Not on file  Food Insecurity: Not on file  Transportation Needs: Not on file  Physical Activity: Not on file   Stress: Not on file  Social Connections: Not on file     Family History: The patient's family history includes Tuberculosis in her mother.  ROS:   Please see the history of present illness.     All other systems reviewed and are negative.  EKGs/Labs/Other Studies Reviewed:    The following studies were reviewed today: Echo 09/06/2018- IMPRESSIONS    1. The left ventricle has low normal systolic function, with an ejection  fraction of 50-55%. The cavity size was normal. Left ventricular diastolic  Doppler parameters are consistent with pseudonormalization. Elevated left  ventricular end-diastolic  pressure.  2. Distal septal and posterior lateral hypokinesis.  3. The right ventricle has normal systolic function. The cavity was  normal. There is no increase in right ventricular wall thickness.  4. Trivial pericardial effusion is present.  5. Mild thickening of the mitral valve leaflet. Mild calcification of the  mitral valve leaflet. Mitral valve regurgitation is mild to moderate by  color flow Doppler.  6. The aortic valve is tricuspid. Moderate thickening of the aortic  valve. Moderate calcification of the aortic valve. Aortic valve  regurgitation is mild by color flow Doppler.  7. The aortic root is normal in size and structure.  8. The interatrial septum was not well visualized.   EKG:  EKG is not ordered today.  The ekg 03/22/2020 today demonstrates NSR, HR 80, poor anterior RW, 1st AVB, PVC  Recent Labs: 03/22/2020: ALT 17; BUN 54; Creatinine, Ser 5.81; Hemoglobin 7.7; Platelets 75; Potassium 3.4; Sodium 136  Recent Lipid Panel    Component Value Date/Time   CHOL 160 09/05/2018 2348   TRIG 159 (H) 09/05/2018 2348   HDL 74 09/05/2018 2348   CHOLHDL 2.2 09/05/2018 2348   VLDL 32 09/05/2018 2348   LDLCALC 54 09/05/2018 2348    Physical Exam:    VS:  BP 132/60   Pulse 73   Ht 5' 1.5" (1.562 m)   Wt 163 lb 9.6 oz (74.2 kg)   BMI 30.41 kg/m  Wt  Readings from Last 3 Encounters:  07/27/20 163 lb 9.6 oz (74.2 kg)  03/19/20 145 lb (65.8 kg)  01/21/20 141 lb 3.2 oz (64 kg)     GEN: Well nourished, well developed in no acute distress HEENT: Normal NECK: No JVD CARDIAC: RRR, no murmurs, rubs, gallops RESPIRATORY:  Clear to auscultation without rales, wheezing or rhonchi  ABDOMEN: Soft, non-distended MUSCULOSKELETAL:  No edema; No deformity  SKIN: Warm and dry NEUROLOGIC:  Alert and oriented x 3 PSYCHIATRIC:  Normal affect   ASSESSMENT:    History of ST elevation myocardial infarction (STEMI) April 2020  CAD S/P percutaneous coronary angioplasty  08/2018: posterior STEMI s/p DES to LCx x2  ESRD on dialysis [facility]) On peritoneal dialysis  Dyslipidemia, goal LDL below 70 On statin Rx- LDL 54 2020  Essential hypertension Controlled  PLAN:    I will speak with Dr. Claiborne Billings about stopping the patient's aspirin 81 mg and continuing on Plavix.  Follow-up with him in 6 months.   Medication Adjustments/Labs and Tests Ordered: Current medicines are reviewed at length with the patient today.  Concerns regarding medicines are outlined above.  No orders of the defined types were placed in this encounter.  No orders of the defined types were placed in this encounter.   Patient Instructions  Medication Instructions:  Continue current medications  *If you need a refill on your cardiac medications before your next appointment, please call your pharmacy*   Lab Work: None Ordered   Testing/Procedures: None Ordered   Follow-Up: At [facility], you and your health needs are our priority.  As part of our continuing mission to provide you with exceptional heart care, we have created designated Provider Care Teams.  These Care Teams include your primary Cardiologist (physician) and Advanced Practice Providers (APPs -  Physician Assistants and Nurse Practitioners) who all work together to provide you with the care you need, when  you need it.  We recommend signing up for the patient portal called "MyChart".  Sign up information is provided on this After Visit Summary.  MyChart is used to connect with patients for Virtual Visits (Telemedicine).  Patients are able to view lab/test results, encounter notes, upcoming appointments, etc.  Non-urgent messages can be sent to your provider as well.   To learn more about what you can do with MyChart, go to [email or web address].    Your next appointment:   6 month(s)  The format for your next appointment:   In Person  Provider:   You may see Shelva Majestic, MD or one of the following Advanced Practice Providers on your designated Care Team:    Almyra Deforest, PA-C  Fabian Sharp, PA-C or   Roby Lofts, PA-C        Signed, Kerin Ransom, [place]  07/27/2020 3:21 PM    [place]

## 2020-07-27 NOTE — Assessment & Plan Note (Signed)
Controlled.  

## 2020-07-27 NOTE — Progress Notes (Signed)
v

## 2020-07-27 NOTE — Assessment & Plan Note (Signed)
On peritoneal dialysis

## 2020-07-27 NOTE — Assessment & Plan Note (Signed)
08/2018: posterior STEMI s/p DES to LCx x2

## 2020-07-27 NOTE — Patient Instructions (Signed)
Medication Instructions:  Continue current medications  *If you need a refill on your cardiac medications before your next appointment, please call your pharmacy*   Lab Work: None Ordered   Testing/Procedures: None Ordered   Follow-Up: At [facility], you and your health needs are our priority.  As part of our continuing mission to provide you with exceptional heart care, we have created designated Provider Care Teams.  These Care Teams include your primary Cardiologist (physician) and Advanced Practice Providers (APPs -  Physician Assistants and Nurse Practitioners) who all work together to provide you with the care you need, when you need it.  We recommend signing up for the patient portal called "MyChart".  Sign up information is provided on this After Visit Summary.  MyChart is used to connect with patients for Virtual Visits (Telemedicine).  Patients are able to view lab/test results, encounter notes, upcoming appointments, etc.  Non-urgent messages can be sent to your provider as well.   To learn more about what you can do with MyChart, go to [email or web address].    Your next appointment:   6 month(s)  The format for your next appointment:   In Person  Provider:   You may see Shelva Majestic, MD or one of the following Advanced Practice Providers on your designated Care Team:    Almyra Deforest, PA-C  Fabian Sharp, PA-C or   Roby Lofts, [place]

## 2020-07-27 NOTE — Assessment & Plan Note (Signed)
On statin Rx- LDL 54 2020

## 2020-08-01 ENCOUNTER — Other Ambulatory Visit: Payer: Self-pay | Admitting: Cardiovascular Disease

## 2020-08-10 ENCOUNTER — Inpatient Hospital Stay (HOSPITAL_COMMUNITY)
Admission: EM | Admit: 2020-08-10 | Discharge: 2020-08-13 | DRG: 682 | Disposition: A | Discharge status: Home / Self Care | Payer: Medicare Other | Attending: Internal Medicine | Admitting: Internal Medicine

## 2020-08-10 ENCOUNTER — Emergency Department (HOSPITAL_COMMUNITY): Payer: Medicare Other

## 2020-08-10 ENCOUNTER — Encounter (HOSPITAL_COMMUNITY): Payer: Self-pay

## 2020-08-10 DIAGNOSIS — G9341 Metabolic encephalopathy: Secondary | ICD-10-CM | POA: Diagnosis present

## 2020-08-10 DIAGNOSIS — Z79899 Other long term (current) drug therapy: Secondary | ICD-10-CM

## 2020-08-10 DIAGNOSIS — J9601 Acute respiratory failure with hypoxia: Secondary | ICD-10-CM | POA: Diagnosis present

## 2020-08-10 DIAGNOSIS — E8770 Fluid overload, unspecified: Secondary | ICD-10-CM | POA: Diagnosis present

## 2020-08-10 DIAGNOSIS — Z91041 Radiographic dye allergy status: Secondary | ICD-10-CM

## 2020-08-10 DIAGNOSIS — E8889 Other specified metabolic disorders: Secondary | ICD-10-CM | POA: Diagnosis present

## 2020-08-10 DIAGNOSIS — Z7982 Long term (current) use of aspirin: Secondary | ICD-10-CM

## 2020-08-10 DIAGNOSIS — R627 Adult failure to thrive: Secondary | ICD-10-CM | POA: Diagnosis present

## 2020-08-10 DIAGNOSIS — Z955 Presence of coronary angioplasty implant and graft: Secondary | ICD-10-CM

## 2020-08-10 DIAGNOSIS — I25110 Atherosclerotic heart disease of native coronary artery with unstable angina pectoris: Secondary | ICD-10-CM | POA: Diagnosis present

## 2020-08-10 DIAGNOSIS — E039 Hypothyroidism, unspecified: Secondary | ICD-10-CM | POA: Diagnosis present

## 2020-08-10 DIAGNOSIS — N2581 Secondary hyperparathyroidism of renal origin: Secondary | ICD-10-CM | POA: Diagnosis present

## 2020-08-10 DIAGNOSIS — I10 Essential (primary) hypertension: Secondary | ICD-10-CM | POA: Diagnosis present

## 2020-08-10 DIAGNOSIS — Z79890 Hormone replacement therapy (postmenopausal): Secondary | ICD-10-CM

## 2020-08-10 DIAGNOSIS — R042 Hemoptysis: Secondary | ICD-10-CM | POA: Diagnosis present

## 2020-08-10 DIAGNOSIS — I21A1 Myocardial infarction type 2: Secondary | ICD-10-CM | POA: Diagnosis present

## 2020-08-10 DIAGNOSIS — F0390 Unspecified dementia without behavioral disturbance: Secondary | ICD-10-CM | POA: Diagnosis present

## 2020-08-10 DIAGNOSIS — Z831 Family history of other infectious and parasitic diseases: Secondary | ICD-10-CM

## 2020-08-10 DIAGNOSIS — I214 Non-ST elevation (NSTEMI) myocardial infarction: Secondary | ICD-10-CM | POA: Diagnosis present

## 2020-08-10 DIAGNOSIS — E785 Hyperlipidemia, unspecified: Secondary | ICD-10-CM | POA: Diagnosis present

## 2020-08-10 DIAGNOSIS — Z66 Do not resuscitate: Secondary | ICD-10-CM | POA: Diagnosis present

## 2020-08-10 DIAGNOSIS — Z7902 Long term (current) use of antithrombotics/antiplatelets: Secondary | ICD-10-CM

## 2020-08-10 DIAGNOSIS — I69911 Memory deficit following unspecified cerebrovascular disease: Secondary | ICD-10-CM | POA: Diagnosis present

## 2020-08-10 DIAGNOSIS — R0602 Shortness of breath: Secondary | ICD-10-CM | POA: Diagnosis not present

## 2020-08-10 DIAGNOSIS — F32A Depression, unspecified: Secondary | ICD-10-CM | POA: Diagnosis present

## 2020-08-10 DIAGNOSIS — Z9861 Coronary angioplasty status: Secondary | ICD-10-CM

## 2020-08-10 DIAGNOSIS — Z992 Dependence on renal dialysis: Secondary | ICD-10-CM

## 2020-08-10 DIAGNOSIS — I249 Acute ischemic heart disease, unspecified: Secondary | ICD-10-CM

## 2020-08-10 DIAGNOSIS — I120 Hypertensive chronic kidney disease with stage 5 chronic kidney disease or end stage renal disease: Secondary | ICD-10-CM | POA: Diagnosis not present

## 2020-08-10 DIAGNOSIS — Z7189 Other specified counseling: Secondary | ICD-10-CM

## 2020-08-10 DIAGNOSIS — E1129 Type 2 diabetes mellitus with other diabetic kidney complication: Secondary | ICD-10-CM

## 2020-08-10 DIAGNOSIS — R4182 Altered mental status, unspecified: Secondary | ICD-10-CM

## 2020-08-10 DIAGNOSIS — H547 Unspecified visual loss: Secondary | ICD-10-CM | POA: Diagnosis present

## 2020-08-10 DIAGNOSIS — Z9841 Cataract extraction status, right eye: Secondary | ICD-10-CM

## 2020-08-10 DIAGNOSIS — Z9842 Cataract extraction status, left eye: Secondary | ICD-10-CM

## 2020-08-10 DIAGNOSIS — N186 End stage renal disease: Secondary | ICD-10-CM

## 2020-08-10 DIAGNOSIS — I252 Old myocardial infarction: Secondary | ICD-10-CM

## 2020-08-10 DIAGNOSIS — Z87891 Personal history of nicotine dependence: Secondary | ICD-10-CM

## 2020-08-10 DIAGNOSIS — Z961 Presence of intraocular lens: Secondary | ICD-10-CM | POA: Diagnosis present

## 2020-08-10 DIAGNOSIS — D696 Thrombocytopenia, unspecified: Secondary | ICD-10-CM | POA: Diagnosis present

## 2020-08-10 DIAGNOSIS — E1122 Type 2 diabetes mellitus with diabetic chronic kidney disease: Secondary | ICD-10-CM | POA: Diagnosis present

## 2020-08-10 DIAGNOSIS — D638 Anemia in other chronic diseases classified elsewhere: Secondary | ICD-10-CM | POA: Diagnosis present

## 2020-08-10 DIAGNOSIS — G40909 Epilepsy, unspecified, not intractable, without status epilepticus: Secondary | ICD-10-CM | POA: Diagnosis present

## 2020-08-10 DIAGNOSIS — E11649 Type 2 diabetes mellitus with hypoglycemia without coma: Secondary | ICD-10-CM | POA: Diagnosis present

## 2020-08-10 DIAGNOSIS — Z85828 Personal history of other malignant neoplasm of skin: Secondary | ICD-10-CM

## 2020-08-10 DIAGNOSIS — Z515 Encounter for palliative care: Secondary | ICD-10-CM

## 2020-08-10 DIAGNOSIS — J189 Pneumonia, unspecified organism: Secondary | ICD-10-CM

## 2020-08-10 DIAGNOSIS — Z947 Corneal transplant status: Secondary | ICD-10-CM

## 2020-08-10 DIAGNOSIS — I2510 Atherosclerotic heart disease of native coronary artery without angina pectoris: Secondary | ICD-10-CM

## 2020-08-10 DIAGNOSIS — K7581 Nonalcoholic steatohepatitis (NASH): Secondary | ICD-10-CM | POA: Diagnosis present

## 2020-08-10 DIAGNOSIS — Z20822 Contact with and (suspected) exposure to covid-19: Secondary | ICD-10-CM | POA: Diagnosis present

## 2020-08-10 DIAGNOSIS — I493 Ventricular premature depolarization: Secondary | ICD-10-CM | POA: Diagnosis present

## 2020-08-10 DIAGNOSIS — Z794 Long term (current) use of insulin: Secondary | ICD-10-CM

## 2020-08-10 DIAGNOSIS — J9811 Atelectasis: Secondary | ICD-10-CM | POA: Diagnosis present

## 2020-08-10 DIAGNOSIS — Z91048 Other nonmedicinal substance allergy status: Secondary | ICD-10-CM

## 2020-08-10 LAB — CBC
HCT: 38.9 % (ref 36.0–46.0)
Hemoglobin: 11.5 g/dL — ABNORMAL LOW (ref 12.0–15.0)
MCH: 27.5 pg (ref 26.0–34.0)
MCHC: 29.6 g/dL — ABNORMAL LOW (ref 30.0–36.0)
MCV: 93.1 fL (ref 80.0–100.0)
Platelets: 82 10*3/uL — ABNORMAL LOW (ref 150–400)
RBC: 4.18 MIL/uL (ref 3.87–5.11)
RDW: 15.9 % — ABNORMAL HIGH (ref 11.5–15.5)
WBC: 4.1 10*3/uL (ref 4.0–10.5)
nRBC: 0 % (ref 0.0–0.2)

## 2020-08-10 LAB — COMPREHENSIVE METABOLIC PANEL
ALT: 22 U/L (ref 0–44)
AST: 22 U/L (ref 15–41)
Albumin: 2.9 g/dL — ABNORMAL LOW (ref 3.5–5.0)
Alkaline Phosphatase: 127 U/L — ABNORMAL HIGH (ref 38–126)
Anion gap: 15 (ref 5–15)
BUN: 53 mg/dL — ABNORMAL HIGH (ref 8–23)
CO2: 24 mmol/L (ref 22–32)
Calcium: 9.5 mg/dL (ref 8.9–10.3)
Chloride: 98 mmol/L (ref 98–111)
Creatinine, Ser: 5.91 mg/dL — ABNORMAL HIGH (ref 0.44–1.00)
GFR, Estimated: 7 mL/min — ABNORMAL LOW (ref >60)
Glucose, Bld: 88 mg/dL (ref 70–99)
Potassium: 4.2 mmol/L (ref 3.5–5.1)
Sodium: 137 mmol/L (ref 135–145)
Total Bilirubin: 0.8 mg/dL (ref 0.3–1.2)
Total Protein: 6.3 g/dL — ABNORMAL LOW (ref 6.5–8.1)

## 2020-08-10 NOTE — ED Notes (Signed)
Pt to CT

## 2020-08-10 NOTE — ED Provider Notes (Incomplete)
[place] EMERGENCY DEPARTMENT Provider Note   CSN: [id number] Arrival date & time: 08/10/20  1946     History Chief Complaint  Patient presents with  . Shortness of Breath  . Altered Mental Status    Samantha Conrad is a 80 y.o. female.  HPI     Past Medical History:  Diagnosis Date  . Anemia   . Basal cell carcinoma     on nose/face  . CAD (coronary artery disease)    a. 08/2018: posterior STEMI s/p DES to LCx x2  . Depression   . ESRD on peritoneal dialysis ([place])    "q night" (11/29/2017)  . Fatty liver   . Hypertension   . Hypothyroidism   . NASH (nonalcoholic steatohepatitis)   . Seizures ([place])    ? when she may have had a stroke sometime in the spring of 2018  . Sleep apnea    diagnosed years ago "after I'd gained alot of weight;  no longer a problem since weight loss"  . Stroke [facility]) 04/2016   memory issues, lost 1/2 vision in both eyes  . Thrombocytopenia ([place])   . Thyroid disease   . Type II diabetes mellitus [facility])     Patient Active Problem List   Diagnosis Date Noted  . PNA (pneumonia) 03/19/2020  . Peritonitis ([place])   . Spontaneous bacterial peritonitis ([place]) 09/13/2018  . CAD S/P percutaneous coronary angioplasty   . Anemia   . Thrombocytopenia ([place])   . History of ST elevation myocardial infarction (STEMI) 09/06/2018  . Symptomatic anemia 03/11/2017  . Diet-controlled diabetes mellitus ([place]) 03/11/2017  . NASH (nonalcoholic steatohepatitis) 03/11/2017  . Essential hypertension 03/11/2017  . Hypothyroidism 03/11/2017  . ESRD on dialysis ([place]) 03/11/2017  . Seizure disorder 03/11/2017  . Abdominal pain 03/11/2017  . Dyslipidemia, goal LDL below 70 03/11/2017  . Chronic pancreatitis ([place]) 03/11/2017  . Constipation 03/11/2017  . Laryngopharyngeal reflux (LPR) 02/17/2015  . H/O Mild allergic rhinitis 02/17/2015  . Cirrhosis ([place]) 02/17/2015    Past Surgical History:  Procedure Laterality Date  . ABDOMINAL  HYSTERECTOMY    . AV FISTULA PLACEMENT Right 10/30/2016   Procedure: INSERTION OF ARTERIOVENOUS (AV) GORE-TEX GRAFT RIGHT UPPER ARM;  Surgeon: Elam Dutch, MD;  Location: [place];  Service: Vascular;  Laterality: Right;  . CATARACT EXTRACTION W/ INTRAOCULAR LENS  IMPLANT, BILATERAL Bilateral   . CHOLECYSTECTOMY OPEN    . CORNEAL TRANSPLANT Bilateral   . CORONARY ANGIOGRAPHY N/A 09/06/2018   Procedure: CORONARY ANGIOGRAPHY;  Surgeon: Lorretta Harp, MD;  Location: [place] CV LAB;  Service: Cardiovascular;  Laterality: N/A;  . CORONARY/GRAFT ACUTE MI REVASCULARIZATION N/A 09/06/2018   Procedure: Coronary/Graft Acute MI Revascularization;  Surgeon: Lorretta Harp, MD;  Location: [place] CV LAB;  Service: Cardiovascular;  Laterality: N/A;  . EYE SURGERY    . KNEE ARTHROSCOPY Left   . LEFT HEART CATH AND CORONARY ANGIOGRAPHY N/A 09/06/2018   Procedure: LEFT HEART CATH AND CORONARY ANGIOGRAPHY;  Surgeon: Lorretta Harp, MD;  Location: [place] CV LAB;  Service: Cardiovascular;  Laterality: N/A;  . MOHS SURGERY     "nose"  . SHOULDER OPEN ROTATOR CUFF REPAIR Left   . THROMBECTOMY AND REVISION OF ARTERIOVENTOUS (AV) GORETEX  GRAFT Right 12/04/2016   Procedure: THROMBECTOMY/ REVISION OF RIGHT UPPER ARM ARTERIOVENOUS GORETEX GRAFT;  Surgeon: Elam Dutch, MD;  Location: [place];  Service: Vascular;  Laterality: Right;  . TONSILLECTOMY  OB History   No obstetric history on file.     Family History  Problem Relation Age of Onset  . Tuberculosis Mother     Social History   Tobacco Use  . Smoking status: Never Smoker  . Smokeless tobacco: Former Systems developer    Types: Snuff  . Tobacco comment: "no snuff since < 1980s"  Vaping Use  . Vaping Use: Never used  Substance Use Topics  . Alcohol use: Never  . Drug use: Never    Home Medications Prior to Admission medications   Medication Sig Start Date End Date Taking? Authorizing Provider  acetaminophen-codeine (TYLENOL  #3) 300-30 MG tablet Take 1-2 tablets by mouth every 6 (six) hours as needed for moderate pain.    [provider]  Amino Acids-Protein Hydrolys (FEEDING SUPPLEMENT, PRO-STAT SUGAR FREE 64,) LIQD Take 30 mLs by mouth 2 (two) times daily. 03/16/17   Doreatha Lew, MD  amLODipine (NORVASC) 5 MG tablet Take 1 tablet (5 mg total) by mouth daily. Patient taking differently: Take 5 mg by mouth at bedtime. 07/31/19 03/20/20  Troy Sine, MD  aspirin 81 MG chewable tablet Chew 1 tablet (81 mg total) by mouth daily. 09/08/18   Eileen Stanford, PA-C  atorvastatin (LIPITOR) 40 MG tablet TAKE 1 TABLET (40 MG TOTAL) BY MOUTH DAILY AT 6 PM. 01/13/20 03/20/20  Troy Sine, MD  carvedilol (COREG) 3.125 MG tablet Take 1 tablet (3.125 mg total) by mouth 2 (two) times daily with a meal. 08/02/20 09/01/20  Troy Sine, MD  citalopram (CELEXA) 10 MG tablet Take 10 mg by mouth daily with breakfast.  10/15/17   [provider]  clopidogrel (PLAVIX) 75 MG tablet TAKE 1 TABLET (75 MG TOTAL) BY MOUTH DAILY WITH BREAKFAST. 11/04/19   Troy Sine, MD  docusate sodium (COLACE) 100 MG capsule Take 100 mg by mouth daily with lunch.    [provider]  gentamicin cream (GARAMYCIN) 0.1 % Apply 1 application topically daily as needed. 03/02/20   [provider]  glipiZIDE (GLUCOTROL) 5 MG tablet Take 5 mg by mouth 2 (two) times daily before a meal.  06/25/18   [provider]  lactulose, encephalopathy, (CHRONULAC) 10 GM/15ML SOLN Take 30 g by mouth once.    [provider]  LANTUS SOLOSTAR 100 UNIT/ML Solostar Pen Inject 12 Units into the skin at bedtime. 05/10/20   [provider]  levETIRAcetam (KEPPRA) 250 MG tablet Take 250 mg by mouth 2 (two) times daily with a meal.     [provider]  levothyroxine (SYNTHROID, LEVOTHROID) 100 MCG tablet Take 100 mcg by mouth daily before breakfast.  09/02/16   [provider]  linagliptin (TRADJENTA)  5 MG TABS tablet Take 5 mg by mouth daily with breakfast.     [provider]  lipase/protease/amylase (CREON) 12000-38000 units CPEP capsule Take 48,000 Units by mouth 3 (three) times daily with meals.    [provider]  Methoxy PEG-Epoetin Beta (MIRCERA IJ) Inject into the skin. 03/09/20   [provider]  multivitamin (PROSIGHT) TABS tablet Take 1 tablet by mouth daily. Patient taking differently: Take 1 tablet by mouth daily with breakfast. 03/17/17   Patrecia Pour, Christean Grief, MD  nitroGLYCERIN (NITROSTAT) 0.4 MG SL tablet Place 0.4 mg under the tongue every 5 (five) minutes as needed for chest pain.     [provider]  Nutritional Supplements (FEEDING SUPPLEMENT, NEPRO CARB STEADY,) LIQD Take 237 mLs by mouth daily. Patient  taking differently: Take 237 mLs by mouth daily with lunch. 03/17/17   Patrecia Pour, Christean Grief, MD  pantoprazole (PROTONIX) 40 MG tablet Take 40 mg by mouth daily with breakfast.  08/21/18   [provider]  pioglitazone (ACTOS) 15 MG tablet Take 15 mg by mouth daily. 07/10/20   [provider]  polyethylene glycol (MIRALAX / GLYCOLAX) packet Take 17 g by mouth daily.    [provider]  potassium chloride SA (KLOR-CON) 20 MEQ tablet Take 20 mEq by mouth daily.    [provider]  prednisoLONE acetate (PRED FORTE) 1 % ophthalmic suspension Place 1 drop into both eyes at bedtime.     [provider]  sevelamer carbonate (RENVELA) 800 MG tablet Take 800 mg by mouth 3 (three) times daily with meals.  07/04/18   [provider]  torsemide (DEMADEX) 100 MG tablet Take 100 mg by mouth daily. 03/17/20   [provider]  traZODone (DESYREL) 50 MG tablet Take 50 mg by mouth at bedtime.    [provider]    Allergies    Contrast media [iodinated diagnostic agents] and Tape  Review of Systems   Review of Systems  Physical Exam Updated Vital Signs BP (!) 143/81 (BP Location: Left  Arm)   Pulse 67   Temp (!) 97.5 F (36.4 C)   Resp (!) 23   SpO2 99%   Physical Exam  ED Results / Procedures / Treatments   Labs (all labs ordered are listed, but only abnormal results are displayed) Labs Reviewed  CBC - Abnormal; Notable for the following components:      Result Value   Hemoglobin 11.5 (*)    MCHC 29.6 (*)    RDW 15.9 (*)    All other components within normal limits  COMPREHENSIVE METABOLIC PANEL  CBG MONITORING, ED    EKG None  Radiology No results found.  Procedures Procedures {Remember to document critical care time when appropriate:1}  Medications Ordered in ED Medications - No data to display  ED Course  I have reviewed the triage vital signs and the nursing notes.  Pertinent labs & imaging results that were available during my care of the patient were reviewed by me and considered in my medical decision making (see chart for details).    MDM Rules/Calculators/A&P                          *** Final Clinical Impression(s) / ED Diagnoses Final diagnoses:  None    Rx / DC Orders ED Discharge Orders    None

## 2020-08-10 NOTE — ED Provider Notes (Signed)
[facility] EMERGENCY DEPARTMENT Provider Note   CSN: [id number] Arrival date & time: 08/10/20  1946     History Chief Complaint  Patient presents with   Shortness of Breath   Altered Mental Status    Samantha Conrad is a 80 y.o. female presents to the Emergency Department complaining of gradual, persistent, progressively worsening, but seemingly somewhat resolved confusion onset this afternoon.  Patient reports that she felt poorly today but is unable to provide much information about exactly what was going on.  She states that her left shoulder and neck were hurting her and that was unrelieved with medication and a heating pad.  She reports that she felt short of breath and very sweaty during that time as well.  She is able to oriented to person place and day of week but unable to answer questions about events surrounding transport to the emergency department.  Level 5 Caveat - confusion  Discussed with pt's son, Costella Hatcher (son).  He went with her to the MD this morning for blood work which was reassuring and she seemed normal at that time.  They were told 3-4 weeks ago taht her dialysis is no longer as efficient as it has been. He reports she has been complaining of neck pain for several days.  He reports his sister called several hours later at lunch reporting that pt was confused with lots of pain in her neck and shoulder.  He reports talking to his mom this evening and she was very confused and very short of breath.  He reports some confusion at baseline, but this afternoon and tonight was significantly worse.  He also reports she has been coughing up blood for several days. They report several days of "low grade fevers to 99.1" Pt lives alone and her children check on her.  He reports no missed doses of medication including torsemide.  He reports she has gained a significant amount of weight in the last few weeks. Pt took 1 tab of Tylenol #3 today for pain - son  checked the pills and the count was correct.  Hx of constipation.   Dry weight: 140 lbs Weight this morning at MD: 161 lbs    The history is provided by the patient, medical records and a caregiver. The history is limited by the condition of the patient. No language interpreter was used.       Past Medical History:  Diagnosis Date   Anemia    Basal cell carcinoma     on nose/face   CAD (coronary artery disease)    a. 08/2018: posterior STEMI s/p DES to LCx x2   Depression    ESRD on peritoneal dialysis ([place])    "q night" (11/29/2017)   Fatty liver    Hypertension    Hypothyroidism    NASH (nonalcoholic steatohepatitis)    Seizures ([place])    ? when she may have had a stroke sometime in the spring of 2018   Sleep apnea    diagnosed years ago "after I'd gained alot of weight;  no longer a problem since weight loss"   Stroke [facility]) 04/2016   memory issues, lost 1/2 vision in both eyes   Thrombocytopenia ([place])    Thyroid disease    Type II diabetes mellitus ([place])     Patient Active Problem List   Diagnosis Date Noted   PNA (pneumonia) 03/19/2020   Peritonitis ([place])    Spontaneous bacterial peritonitis ([place]) 09/13/2018   CAD  S/P percutaneous coronary angioplasty    Anemia    Thrombocytopenia (HCC)    History of ST elevation myocardial infarction (STEMI) 09/06/2018   Symptomatic anemia 03/11/2017   Diet-controlled diabetes mellitus ([place]) 03/11/2017   NASH (nonalcoholic steatohepatitis) 03/11/2017   Essential hypertension 03/11/2017   Hypothyroidism 03/11/2017   ESRD on dialysis ([place]) 03/11/2017   Seizure disorder 03/11/2017   Abdominal pain 03/11/2017   Dyslipidemia, goal LDL below 70 03/11/2017   Chronic pancreatitis ([place]) 03/11/2017   Constipation 03/11/2017   Laryngopharyngeal reflux (LPR) 02/17/2015   H/O Mild allergic rhinitis 02/17/2015   Cirrhosis ([place]) 02/17/2015    Past Surgical History:  Procedure Laterality Date    ABDOMINAL HYSTERECTOMY     AV FISTULA PLACEMENT Right 10/30/2016   Procedure: INSERTION OF ARTERIOVENOUS (AV) GORE-TEX GRAFT RIGHT UPPER ARM;  Surgeon: Elam Dutch, MD;  Location: [facility];  Service: Vascular;  Laterality: Right;   CATARACT EXTRACTION W/ INTRAOCULAR LENS  IMPLANT, BILATERAL Bilateral    CHOLECYSTECTOMY OPEN     CORNEAL TRANSPLANT Bilateral    CORONARY ANGIOGRAPHY N/A 09/06/2018   Procedure: CORONARY ANGIOGRAPHY;  Surgeon: Lorretta Harp, MD;  Location: [facility] CV LAB;  Service: Cardiovascular;  Laterality: N/A;   CORONARY/GRAFT ACUTE MI REVASCULARIZATION N/A 09/06/2018   Procedure: Coronary/Graft Acute MI Revascularization;  Surgeon: Lorretta Harp, MD;  Location: [facility] CV LAB;  Service: Cardiovascular;  Laterality: N/A;   EYE SURGERY     KNEE ARTHROSCOPY Left    LEFT HEART CATH AND CORONARY ANGIOGRAPHY N/A 09/06/2018   Procedure: LEFT HEART CATH AND CORONARY ANGIOGRAPHY;  Surgeon: Lorretta Harp, MD;  Location: [facility];  Service: Cardiovascular;  Laterality: N/A;   MOHS SURGERY     "nose"   SHOULDER OPEN ROTATOR CUFF REPAIR Left    THROMBECTOMY AND REVISION OF ARTERIOVENTOUS (AV) GORETEX  GRAFT Right 12/04/2016   Procedure: THROMBECTOMY/ REVISION OF RIGHT UPPER ARM ARTERIOVENOUS GORETEX GRAFT;  Surgeon: Elam Dutch, MD;  Location: [facility];  Service: Vascular;  Laterality: Right;   TONSILLECTOMY       OB History   No obstetric history on file.     Family History  Problem Relation Age of Onset   Tuberculosis Mother     Social History   Tobacco Use   Smoking status: Never Smoker   Smokeless tobacco: Former Systems developer    Types: Snuff   Tobacco comment: "no snuff since < 1980s"  Vaping Use   Vaping Use: Never used  Substance Use Topics   Alcohol use: Never   Drug use: Never    Home Medications Prior to Admission medications   Medication Sig Start Date End Date Taking? Authorizing Provider  acetaminophen-codeine  (TYLENOL #3) 300-30 MG tablet Take 1-2 tablets by mouth every 6 (six) hours as needed for moderate pain.    [provider]  Amino Acids-Protein Hydrolys (FEEDING SUPPLEMENT, PRO-STAT SUGAR FREE 64,) LIQD Take 30 mLs by mouth 2 (two) times daily. 03/16/17   Doreatha Lew, MD  amLODipine (NORVASC) 5 MG tablet Take 1 tablet (5 mg total) by mouth daily. Patient taking differently: Take 5 mg by mouth at bedtime. 07/31/19 03/20/20  Troy Sine, MD  aspirin 81 MG chewable tablet Chew 1 tablet (81 mg total) by mouth daily. 09/08/18   Eileen Stanford, PA-C  atorvastatin (LIPITOR) 40 MG tablet TAKE 1 TABLET (40 MG TOTAL) BY MOUTH DAILY AT 6 PM. 01/13/20 03/20/20  Troy Sine, MD  carvedilol (COREG)  3.125 MG tablet Take 1 tablet (3.125 mg total) by mouth 2 (two) times daily with a meal. 08/02/20 09/01/20  Troy Sine, MD  citalopram (CELEXA) 10 MG tablet Take 10 mg by mouth daily with breakfast.  10/15/17   [provider]  clopidogrel (PLAVIX) 75 MG tablet TAKE 1 TABLET (75 MG TOTAL) BY MOUTH DAILY WITH BREAKFAST. 11/04/19   Troy Sine, MD  docusate sodium (COLACE) 100 MG capsule Take 100 mg by mouth daily with lunch.    [provider]  gentamicin cream (GARAMYCIN) 0.1 % Apply 1 application topically daily as needed. 03/02/20   [provider]  glipiZIDE (GLUCOTROL) 5 MG tablet Take 5 mg by mouth 2 (two) times daily before a meal.  06/25/18   [provider]  lactulose, encephalopathy, (CHRONULAC) 10 GM/15ML SOLN Take 30 g by mouth once.    [provider]  LANTUS SOLOSTAR 100 UNIT/ML Solostar Pen Inject 12 Units into the skin at bedtime. 05/10/20   [provider]  levETIRAcetam (KEPPRA) 250 MG tablet Take 250 mg by mouth 2 (two) times daily with a meal.     [provider]  levothyroxine (SYNTHROID, LEVOTHROID) 100 MCG tablet Take 100 mcg by mouth daily before breakfast.  09/02/16   [provider]  linagliptin  (TRADJENTA) 5 MG TABS tablet Take 5 mg by mouth daily with breakfast.     [provider]  lipase/protease/amylase (CREON) 12000-38000 units CPEP capsule Take 48,000 Units by mouth 3 (three) times daily with meals.    [provider]  Methoxy PEG-Epoetin Beta (MIRCERA IJ) Inject into the skin. 03/09/20   [provider]  multivitamin (PROSIGHT) TABS tablet Take 1 tablet by mouth daily. Patient taking differently: Take 1 tablet by mouth daily with breakfast. 03/17/17   Patrecia Pour, Christean Grief, MD  nitroGLYCERIN (NITROSTAT) 0.4 MG SL tablet Place 0.4 mg under the tongue every 5 (five) minutes as needed for chest pain.     [provider]  Nutritional Supplements (FEEDING SUPPLEMENT, NEPRO CARB STEADY,) LIQD Take 237 mLs by mouth daily. Patient taking differently: Take 237 mLs by mouth daily with lunch. 03/17/17   Patrecia Pour, Christean Grief, MD  pantoprazole (PROTONIX) 40 MG tablet Take 40 mg by mouth daily with breakfast.  08/21/18   [provider]  pioglitazone (ACTOS) 15 MG tablet Take 15 mg by mouth daily. 07/10/20   [provider]  polyethylene glycol (MIRALAX / GLYCOLAX) packet Take 17 g by mouth daily.    [provider]  potassium chloride SA (KLOR-CON) 20 MEQ tablet Take 20 mEq by mouth daily.    [provider]  prednisoLONE acetate (PRED FORTE) 1 % ophthalmic suspension Place 1 drop into both eyes at bedtime.     [provider]  sevelamer carbonate (RENVELA) 800 MG tablet Take 800 mg by mouth 3 (three) times daily with meals.  07/04/18   [provider]  torsemide (DEMADEX) 100 MG tablet Take 100 mg by mouth daily. 03/17/20   [provider]  traZODone (DESYREL) 50 MG tablet Take 50 mg by mouth at bedtime.    [provider]    Allergies    Contrast media [iodinated diagnostic agents] and Tape  Review of Systems   Review of Systems  Unable to perform ROS: Mental status change   Constitutional: Positive for fatigue.  Respiratory: Positive for shortness of breath.   Neurological: Positive for weakness.  Psychiatric/Behavioral: Positive for confusion.    Physical Exam  Updated Vital Signs BP (!) 145/62    Pulse 67    Temp (!) 97.5 F (36.4 C)    Resp 11    SpO2 100%   Physical Exam Vitals and nursing note reviewed.  Constitutional:      General: She is not in acute distress.    Appearance: She is well-developed. She is not diaphoretic.     Comments: Awake, alert, nontoxic appearance  HENT:     Head: Normocephalic and atraumatic.     Mouth/Throat:     Pharynx: No oropharyngeal exudate.  Eyes:     General: No scleral icterus.    Conjunctiva/sclera: Conjunctivae normal.     Pupils: Pupils are equal, round, and reactive to light.  Cardiovascular:     Rate and Rhythm: Normal rate and regular rhythm.  Pulmonary:     Effort: Pulmonary effort is normal. No tachypnea, bradypnea, accessory muscle usage or respiratory distress.     Breath sounds: Examination of the right-lower field reveals decreased breath sounds. Examination of the left-lower field reveals decreased breath sounds. Decreased breath sounds (lower lobes) present. No wheezing or rhonchi.     Comments: Increased work of breathing when moving around the bed Chest:     Chest wall: No tenderness.  Abdominal:     General: Bowel sounds are normal.     Palpations: Abdomen is soft. There is no mass.     Tenderness: There is no abdominal tenderness. There is no guarding or rebound.  Musculoskeletal:        General: Normal range of motion.     Cervical back: Normal range of motion and neck supple.     Right lower leg: No tenderness. Edema present.     Left lower leg: No tenderness. Edema present.  Skin:    General: Skin is warm and dry.     Capillary Refill: Capillary refill takes less than 2 seconds.  Neurological:     Mental Status: She is alert and oriented to person, place, and time.     Comments:  Speech is clear and goal oriented Moves extremities without ataxia  Psychiatric:        Mood and Affect: Mood normal.     ED Results / Procedures / Treatments   Labs (all labs ordered are listed, but only abnormal results are displayed) Labs Reviewed  COMPREHENSIVE METABOLIC PANEL - Abnormal; Notable for the following components:      Result Value   BUN 53 (*)    Creatinine, Ser 5.91 (*)    Total Protein 6.3 (*)    Albumin 2.9 (*)    Alkaline Phosphatase 127 (*)    GFR, Estimated 7 (*)    All other components within normal limits  CBC - Abnormal; Notable for the following components:   Hemoglobin 11.5 (*)    MCHC 29.6 (*)    RDW 15.9 (*)    Platelets 82 (*)    All other components within normal limits  URINALYSIS, ROUTINE W REFLEX MICROSCOPIC - Abnormal; Notable for the following components:   Color, Urine STRAW (*)    Glucose, UA 150 (*)    Hgb urine dipstick SMALL (*)    Protein, ur 30 (*)    All other components within normal limits  I-STAT VENOUS BLOOD GAS, ED - Abnormal; Notable for the following components:   pCO2, Ven 39.9 (*)    pO2, Ven 50.0 (*)    Sodium 134 (*)    HCT 30.0 (*)  Hemoglobin 10.2 (*)    All other components within normal limits  TROPONIN I (HIGH SENSITIVITY) - Abnormal; Notable for the following components:   Troponin I (High Sensitivity) 237 (*)    All other components within normal limits  LIPASE, BLOOD  AMMONIA  LACTIC ACID, PLASMA  PROTIME-INR  CBG MONITORING, ED  I-STAT ARTERIAL BLOOD GAS, ED  TROPONIN I (HIGH SENSITIVITY)    EKG EKG Interpretation  Date/Time:  Tuesday August 10 2020 19:57:53 EDT Ventricular Rate:  66 PR Interval:  226 QRS Duration: 80 QT Interval:  452 QTC Calculation: 473 R Axis:   9 Text Interpretation: Sinus rhythm with 1st degree A-V block Low voltage QRS Cannot rule out Anterior infarct , age undetermined Abnormal ECG When compared with ECG of 03/19/2020, Premature ventricular complexes are no longer  present Confirmed by Delora Fuel ([id number]) on 08/11/2020 1:14:21 AM   Radiology CT Head Wo Contrast  Result Date: 08/10/2020 CLINICAL DATA:  Mental status change.  Unknown cause. EXAM: CT HEAD WITHOUT CONTRAST TECHNIQUE: Contiguous axial images were obtained from the base of the skull through the vertex without intravenous contrast. COMPARISON:  CT head 05/04/2017 FINDINGS: Brain: Cerebral ventricle sizes are concordant with the degree of cerebral volume loss. Patchy and confluent areas of decreased attenuation are noted throughout the deep and periventricular white matter of the cerebral hemispheres bilaterally, compatible with chronic microvascular ischemic disease. Prior left occipital infarction. No evidence of large-territorial acute infarction. No parenchymal hemorrhage. No mass lesion. No extra-axial collection. No mass effect or midline shift. No hydrocephalus. Basilar cisterns are patent. Vascular: No hyperdense vessel. Atherosclerotic calcifications are present within the cavernous internal carotid arteries. Skull: No acute fracture or focal lesion. Sinuses/Orbits: Paranasal sinuses and mastoid air cells are clear. Bilateral lens replacement. Otherwise the orbits are unremarkable. Other: None. IMPRESSION: No acute intracranial abnormality. Electronically Signed   By: Iven Finn M.D.   On: 08/10/2020 23:15   DG Chest Port 1 View  Result Date: 08/10/2020 CLINICAL DATA:  Shortness of breath. EXAM: PORTABLE CHEST 1 VIEW COMPARISON:  03/19/2020 FINDINGS: Mild cardiomegaly. Aortic atherosclerosis. Streaky retrocardiac left lung base opacities. Suspected small pleural effusions. No pulmonary edema or pneumothorax. No acute osseous abnormalities are seen. IMPRESSION: 1. Streaky retrocardiac left lung base opacities, atelectasis versus pneumonia. 2. Suspected small pleural effusions. Electronically Signed   By: Keith Rake M.D.   On: 08/10/2020 23:06    Procedures .Critical Care Performed by:  Abigail Butts, PA-C Authorized by: Abigail Butts, PA-C   Critical care provider statement:    Critical care time (minutes):  45   Critical care time was exclusive of:  Teaching time and separately billable procedures and treating other patients   Critical care was necessary to treat or prevent imminent or life-threatening deterioration of the following conditions:  Cardiac failure   Critical care was time spent personally by me on the following activities:  Discussions with consultants, evaluation of patient's response to treatment, examination of patient, ordering and performing treatments and interventions, ordering and review of laboratory studies, ordering and review of radiographic studies, pulse oximetry, re-evaluation of patient's condition, obtaining history from patient or surrogate and review of old charts   I assumed direction of critical care for this patient from another provider in my specialty: no     Care discussed with: admitting provider       Medications Ordered in ED Medications  azithromycin (ZITHROMAX) 500 mg in sodium chloride 0.9 % 250 mL IVPB (500 mg Intravenous New Bag/Given  08/11/20 0132)  heparin ADULT infusion 100 units/mL (25000 units/238m) (700 Units/hr Intravenous New Bag/Given 08/11/20 0159)  cefTRIAXone (ROCEPHIN) 1 g in sodium chloride 0.9 % 100 mL IVPB (0 g Intravenous Stopped 08/11/20 0131)  heparin bolus via infusion 2,500 Units (2,500 Units Intravenous Bolus from Bag 08/11/20 0159)    ED Course  I have reviewed the triage vital signs and the nursing notes.  Pertinent labs & imaging results that were available during my care of the patient were reviewed by me and considered in my medical decision making (see chart for details).  Clinical Course as of 08/11/20 0228  Tue Aug 10, 2020  2243 Hemoglobin(!): 11.5 improved [HM]  2243 Creatinine(!): 5.91 Baseline - home dialysis [HM]    Clinical Course User Index [HM] Riaan Toledo, HGwenlyn Perking  MDM Rules/Calculators/A&P                           Patient presents with redness of breath and confusion.  Exam is nonfocal.  Less likely CVA.  Considering CVA, uremia, sepsis, fluid overload, infection, ACS (given shoulder and neck pain with shortness of breath and diaphoresis)  Additional history obtained:  Additional history obtained from  patient's son.. Previous records obtained and reviewed.    Lab Tests:  I Ordered, reviewed, and interpreted labs, which included:  CBC, CMP, ammonia, lactic acid, PT/INR, troponin, lipase -CBC without leukocytosis.  Anemia slightly improved.  CMP with elevated creatinine but baseline for patient.  No elevation in lactic acid, PT/INR or ammonia.  Troponin significantly elevated at 237.  I-STAT blood gas pending.  ECG:  I personally viewed and interpreted the ECG obtained.  Sinus rhythm with first-degree AV block without acute ischemia.   Imaging Studies ordered:  I have placed order for imaging including CT head, plain films of the abdomen and chest. I personally reviewed and interpreted imaging.  No acute abnormality of the CT head.  Chest questionable retrocardiac opacity.  Patient treated for community-acquired pneumonia.  Plain films of the abdomen without acute abnormality including no free air.   ED Course:   Patient presents with shortness of breath, altered mental status, weight gain in the setting of home dialysis.  Suspect fluid overload.  Labs pending.  Patient will likely need admission.  1:43 AM Troponin significantly elevated.  This is consistent with patient's episode of shoulder and neck pain with diaphoresis, weakness and feeling poorly.  ACS likely the cause.  Heparin ordered.  Will consult cardiology.  Will be admitted.  1:56 AM Discussed with Dr. RKalman Shan Cardiology. Doesn't feel this is ACS as patient does not have crushing chest pain..Marland Kitchen Request repeat troponin.  If rising they should be reconsulted and will follow  otherwise they feel this is more likely to be secondary to inadequate dialysis. Given symptoms that appear to be an anginal equivalent, elevated troap above previous baseline), and high risk patient will heparinize.    2:28 AM Discussed patient's case with hospitalist, Dr. CTonie Griffith  I have recommended admission and patient (and family if present) agree with this plan. Admitting physician will place admission orders.   Patient voiced understanding and agreement wit the current medical evaluation and treatment plan.  Questions were answered to expressed satisfaction.     Portions of this note were generated with DLobbyist Dictation errors may occur despite best attempts at proofreading.  The patient was discussed with and seen by Dr. GRoxanne Minswho agrees with the treatment plan.  Final Clinical Impression(s) / ED Diagnoses Final diagnoses:  ACS (acute coronary syndrome) (HCC)  Shortness of breath  Altered mental status, unspecified altered mental status type  ESRD (end stage renal disease) on dialysis [facility])    Rx / DC Orders ED Discharge Orders    None       Nnaemeka Samson, Gwenlyn Perking 44/36/01 [id number]    Delora Fuel, MD 06/34/94 Delrae Rend

## 2020-08-10 NOTE — ED Triage Notes (Addendum)
Assume care from EMS, ems states went to a kidney facility today to obtain blood work and sometime around 1830 pt became disorientated and SOB. [facility] pt does peritoneal dialysis & anemia and hx of stroke 5 years ago with memory issues    Costella Hatcher son [phone]

## 2020-08-11 ENCOUNTER — Encounter (HOSPITAL_COMMUNITY): Payer: Self-pay | Admitting: Family Medicine

## 2020-08-11 ENCOUNTER — Inpatient Hospital Stay (HOSPITAL_COMMUNITY): Payer: Medicare Other

## 2020-08-11 DIAGNOSIS — E1122 Type 2 diabetes mellitus with diabetic chronic kidney disease: Secondary | ICD-10-CM

## 2020-08-11 DIAGNOSIS — I2510 Atherosclerotic heart disease of native coronary artery without angina pectoris: Secondary | ICD-10-CM | POA: Diagnosis not present

## 2020-08-11 DIAGNOSIS — Z66 Do not resuscitate: Secondary | ICD-10-CM | POA: Diagnosis present

## 2020-08-11 DIAGNOSIS — Z20822 Contact with and (suspected) exposure to covid-19: Secondary | ICD-10-CM | POA: Diagnosis present

## 2020-08-11 DIAGNOSIS — E8770 Fluid overload, unspecified: Secondary | ICD-10-CM | POA: Diagnosis present

## 2020-08-11 DIAGNOSIS — Z515 Encounter for palliative care: Secondary | ICD-10-CM | POA: Diagnosis not present

## 2020-08-11 DIAGNOSIS — J189 Pneumonia, unspecified organism: Secondary | ICD-10-CM | POA: Diagnosis not present

## 2020-08-11 DIAGNOSIS — Z7189 Other specified counseling: Secondary | ICD-10-CM | POA: Diagnosis not present

## 2020-08-11 DIAGNOSIS — I214 Non-ST elevation (NSTEMI) myocardial infarction: Secondary | ICD-10-CM | POA: Diagnosis not present

## 2020-08-11 DIAGNOSIS — I120 Hypertensive chronic kidney disease with stage 5 chronic kidney disease or end stage renal disease: Secondary | ICD-10-CM | POA: Diagnosis present

## 2020-08-11 DIAGNOSIS — Z955 Presence of coronary angioplasty implant and graft: Secondary | ICD-10-CM | POA: Diagnosis not present

## 2020-08-11 DIAGNOSIS — I21A1 Myocardial infarction type 2: Secondary | ICD-10-CM | POA: Diagnosis present

## 2020-08-11 DIAGNOSIS — G9341 Metabolic encephalopathy: Secondary | ICD-10-CM | POA: Diagnosis present

## 2020-08-11 DIAGNOSIS — N186 End stage renal disease: Secondary | ICD-10-CM | POA: Diagnosis not present

## 2020-08-11 DIAGNOSIS — J9601 Acute respiratory failure with hypoxia: Secondary | ICD-10-CM | POA: Diagnosis present

## 2020-08-11 DIAGNOSIS — Z794 Long term (current) use of insulin: Secondary | ICD-10-CM | POA: Diagnosis not present

## 2020-08-11 DIAGNOSIS — E039 Hypothyroidism, unspecified: Secondary | ICD-10-CM | POA: Diagnosis present

## 2020-08-11 DIAGNOSIS — I69911 Memory deficit following unspecified cerebrovascular disease: Secondary | ICD-10-CM | POA: Diagnosis present

## 2020-08-11 DIAGNOSIS — E11649 Type 2 diabetes mellitus with hypoglycemia without coma: Secondary | ICD-10-CM | POA: Diagnosis present

## 2020-08-11 DIAGNOSIS — I25110 Atherosclerotic heart disease of native coronary artery with unstable angina pectoris: Secondary | ICD-10-CM | POA: Diagnosis present

## 2020-08-11 DIAGNOSIS — R042 Hemoptysis: Secondary | ICD-10-CM | POA: Diagnosis present

## 2020-08-11 DIAGNOSIS — Z992 Dependence on renal dialysis: Secondary | ICD-10-CM | POA: Diagnosis not present

## 2020-08-11 DIAGNOSIS — F0390 Unspecified dementia without behavioral disturbance: Secondary | ICD-10-CM | POA: Diagnosis present

## 2020-08-11 DIAGNOSIS — K7581 Nonalcoholic steatohepatitis (NASH): Secondary | ICD-10-CM | POA: Diagnosis present

## 2020-08-11 DIAGNOSIS — R778 Other specified abnormalities of plasma proteins: Secondary | ICD-10-CM

## 2020-08-11 DIAGNOSIS — N2581 Secondary hyperparathyroidism of renal origin: Secondary | ICD-10-CM | POA: Diagnosis present

## 2020-08-11 DIAGNOSIS — I10 Essential (primary) hypertension: Secondary | ICD-10-CM

## 2020-08-11 DIAGNOSIS — I248 Other forms of acute ischemic heart disease: Secondary | ICD-10-CM

## 2020-08-11 DIAGNOSIS — J9811 Atelectasis: Secondary | ICD-10-CM | POA: Diagnosis present

## 2020-08-11 DIAGNOSIS — R0602 Shortness of breath: Secondary | ICD-10-CM | POA: Diagnosis present

## 2020-08-11 DIAGNOSIS — Z9861 Coronary angioplasty status: Secondary | ICD-10-CM

## 2020-08-11 DIAGNOSIS — E1129 Type 2 diabetes mellitus with other diabetic kidney complication: Secondary | ICD-10-CM

## 2020-08-11 DIAGNOSIS — D696 Thrombocytopenia, unspecified: Secondary | ICD-10-CM | POA: Diagnosis present

## 2020-08-11 DIAGNOSIS — D638 Anemia in other chronic diseases classified elsewhere: Secondary | ICD-10-CM | POA: Diagnosis present

## 2020-08-11 LAB — I-STAT VENOUS BLOOD GAS, ED
Acid-Base Excess: 1 mmol/L (ref 0.0–2.0)
Bicarbonate: 25.9 mmol/L (ref 20.0–28.0)
Calcium, Ion: 1.17 mmol/L (ref 1.15–1.40)
HCT: 30 % — ABNORMAL LOW (ref 36.0–46.0)
Hemoglobin: 10.2 g/dL — ABNORMAL LOW (ref 12.0–15.0)
O2 Saturation: 86 %
Patient temperature: 98.6
Potassium: 3.7 mmol/L (ref 3.5–5.1)
Sodium: 134 mmol/L — ABNORMAL LOW (ref 135–145)
TCO2: 27 mmol/L (ref 22–32)
pCO2, Ven: 39.9 mmHg — ABNORMAL LOW (ref 44.0–60.0)
pH, Ven: 7.42 (ref 7.250–7.430)
pO2, Ven: 50 mmHg — ABNORMAL HIGH (ref 32.0–45.0)

## 2020-08-11 LAB — URINALYSIS, ROUTINE W REFLEX MICROSCOPIC
Bacteria, UA: NONE SEEN
Bilirubin Urine: NEGATIVE
Glucose, UA: 150 mg/dL — AB
Ketones, ur: NEGATIVE mg/dL
Leukocytes,Ua: NEGATIVE
Nitrite: NEGATIVE
Protein, ur: 30 mg/dL — AB
Specific Gravity, Urine: 1.006 (ref 1.005–1.030)
pH: 8 (ref 5.0–8.0)

## 2020-08-11 LAB — ECHOCARDIOGRAM COMPLETE
Area-P 1/2: 5.38 cm2
S' Lateral: 3.7 cm

## 2020-08-11 LAB — GLUCOSE, CAPILLARY
Glucose-Capillary: 149 mg/dL — ABNORMAL HIGH (ref 70–99)
Glucose-Capillary: 277 mg/dL — ABNORMAL HIGH (ref 70–99)

## 2020-08-11 LAB — HEMOGLOBIN A1C
Hgb A1c MFr Bld: 6.3 % — ABNORMAL HIGH (ref 4.8–5.6)
Mean Plasma Glucose: 134.11 mg/dL

## 2020-08-11 LAB — LIPID PANEL
Cholesterol: 113 mg/dL (ref 0–200)
HDL: 54 mg/dL (ref >40)
LDL Cholesterol: 41 mg/dL (ref 0–99)
Total CHOL/HDL Ratio: 2.1 RATIO
Triglycerides: 88 mg/dL (ref <150)
VLDL: 18 mg/dL (ref 0–40)

## 2020-08-11 LAB — CBG MONITORING, ED
Glucose-Capillary: 66 mg/dL — ABNORMAL LOW (ref 70–99)
Glucose-Capillary: 74 mg/dL (ref 70–99)
Glucose-Capillary: 119 mg/dL — ABNORMAL HIGH (ref 70–99)
Glucose-Capillary: 134 mg/dL — ABNORMAL HIGH (ref 70–99)

## 2020-08-11 LAB — PROTIME-INR
INR: 1 (ref 0.8–1.2)
Prothrombin Time: 12.9 s (ref 11.4–15.2)

## 2020-08-11 LAB — STREP PNEUMONIAE URINARY ANTIGEN: Strep Pneumo Urinary Antigen: NEGATIVE

## 2020-08-11 LAB — LIPASE, BLOOD: Lipase: 18 U/L (ref 11–51)

## 2020-08-11 LAB — CBC
HCT: 34.3 % — ABNORMAL LOW (ref 36.0–46.0)
Hemoglobin: 10.4 g/dL — ABNORMAL LOW (ref 12.0–15.0)
MCH: 27.8 pg (ref 26.0–34.0)
MCHC: 30.3 g/dL (ref 30.0–36.0)
MCV: 91.7 fL (ref 80.0–100.0)
Platelets: 75 10*3/uL — ABNORMAL LOW (ref 150–400)
RBC: 3.74 MIL/uL — ABNORMAL LOW (ref 3.87–5.11)
RDW: 15.9 % — ABNORMAL HIGH (ref 11.5–15.5)
WBC: 4.7 10*3/uL (ref 4.0–10.5)
nRBC: 0 % (ref 0.0–0.2)

## 2020-08-11 LAB — HEPARIN LEVEL (UNFRACTIONATED)
Heparin Unfractionated: 0.45 [IU]/mL (ref 0.30–0.70)
Heparin Unfractionated: 0.1 IU/mL — ABNORMAL LOW (ref 0.30–0.70)

## 2020-08-11 LAB — SARS CORONAVIRUS 2 (TAT 6-24 HRS): SARS Coronavirus 2: NEGATIVE

## 2020-08-11 LAB — LACTIC ACID, PLASMA: Lactic Acid, Venous: 1.4 mmol/L (ref 0.5–1.9)

## 2020-08-11 LAB — TROPONIN I (HIGH SENSITIVITY)
Troponin I (High Sensitivity): 237 ng/L (ref <18)
Troponin I (High Sensitivity): 215 ng/L (ref <18)

## 2020-08-11 LAB — AMMONIA: Ammonia: 22 umol/L (ref 9–35)

## 2020-08-11 MED ORDER — PANTOPRAZOLE SODIUM 40 MG PO TBEC
40.0000 mg | DELAYED_RELEASE_TABLET | Freq: Every day | ORAL | Status: DC
  Administered 2020-08-11 – 2020-08-13 (×3): 40 mg via ORAL
  Filled 2020-08-11 (×3): qty 1

## 2020-08-11 MED ORDER — LINAGLIPTIN 5 MG PO TABS
5.0000 mg | ORAL_TABLET | Freq: Every day | ORAL | Status: DC
  Filled 2020-08-11: qty 1

## 2020-08-11 MED ORDER — DOXYCYCLINE HYCLATE 100 MG PO TABS
100.0000 mg | ORAL_TABLET | Freq: Two times a day (BID) | ORAL | Status: DC
  Administered 2020-08-11 – 2020-08-13 (×5): 100 mg via ORAL
  Filled 2020-08-11 (×5): qty 1

## 2020-08-11 MED ORDER — INSULIN GLARGINE 100 UNIT/ML ~~LOC~~ SOLN
12.0000 [IU] | Freq: Every day | SUBCUTANEOUS | Status: DC
  Filled 2020-08-11: qty 0.12

## 2020-08-11 MED ORDER — DELFLEX-LC/4.25% DEXTROSE 483 MOSM/L IP SOLN: INTRAPERITONEAL | Status: DC

## 2020-08-11 MED ORDER — ASPIRIN 81 MG PO CHEW
81.0000 mg | CHEWABLE_TABLET | Freq: Every day | ORAL | Status: DC
  Administered 2020-08-11 – 2020-08-13 (×2): 81 mg via ORAL
  Filled 2020-08-11 (×3): qty 1

## 2020-08-11 MED ORDER — SODIUM CHLORIDE 0.9 % IV SOLN
1.0000 g | INTRAVENOUS | Status: DC
  Administered 2020-08-11: 1 g via INTRAVENOUS
  Filled 2020-08-11: qty 10

## 2020-08-11 MED ORDER — INSULIN ASPART 100 UNIT/ML ~~LOC~~ SOLN
0.0000 [IU] | Freq: Three times a day (TID) | SUBCUTANEOUS | Status: DC
  Administered 2020-08-11: 1 [IU] via SUBCUTANEOUS

## 2020-08-11 MED ORDER — SODIUM CHLORIDE 0.9 % IV SOLN
500.0000 mg | Freq: Once | INTRAVENOUS | Status: AC
  Administered 2020-08-11: 500 mg via INTRAVENOUS
  Filled 2020-08-11: qty 500

## 2020-08-11 MED ORDER — CARVEDILOL 3.125 MG PO TABS
3.1250 mg | ORAL_TABLET | Freq: Two times a day (BID) | ORAL | Status: DC
  Administered 2020-08-11 – 2020-08-13 (×5): 3.125 mg via ORAL
  Filled 2020-08-11 (×8): qty 1

## 2020-08-11 MED ORDER — ATORVASTATIN CALCIUM 40 MG PO TABS
40.0000 mg | ORAL_TABLET | Freq: Every day | ORAL | Status: DC
Start: 2020-08-11 — End: 2020-08-13
  Administered 2020-08-11 – 2020-08-12 (×2): 40 mg via ORAL
  Filled 2020-08-11 (×2): qty 1

## 2020-08-11 MED ORDER — HEPARIN (PORCINE) 25000 UT/250ML-% IV SOLN
700.0000 [IU]/h | INTRAVENOUS | Status: DC
  Administered 2020-08-11: 700 [IU]/h via INTRAVENOUS
  Filled 2020-08-11: qty 250

## 2020-08-11 MED ORDER — SODIUM CHLORIDE 0.9% FLUSH
3.0000 mL | INTRAVENOUS | Status: DC | PRN
Start: 2020-08-11 — End: 2020-08-13

## 2020-08-11 MED ORDER — INSULIN GLARGINE 100 UNIT/ML ~~LOC~~ SOLN
10.0000 [IU] | Freq: Every day | SUBCUTANEOUS | Status: DC
  Administered 2020-08-11 – 2020-08-12 (×2): 10 [IU] via SUBCUTANEOUS
  Filled 2020-08-11 (×3): qty 0.1

## 2020-08-11 MED ORDER — ACETAMINOPHEN 325 MG PO TABS: 650.0000 mg | ORAL_TABLET | ORAL | Status: DC | PRN

## 2020-08-11 MED ORDER — HEPARIN 1000 UNIT/ML FOR PERITONEAL DIALYSIS
INTRAPERITONEAL | Status: DC | PRN
  Filled 2020-08-11 (×2): qty 5000

## 2020-08-11 MED ORDER — SODIUM CHLORIDE 0.9 % IV SOLN
1.0000 g | Freq: Once | INTRAVENOUS | Status: AC
  Administered 2020-08-11: 1 g via INTRAVENOUS
  Filled 2020-08-11: qty 10

## 2020-08-11 MED ORDER — NITROGLYCERIN 0.4 MG SL SUBL: 0.4000 mg | SUBLINGUAL_TABLET | SUBLINGUAL | Status: DC | PRN

## 2020-08-11 MED ORDER — SEVELAMER CARBONATE 800 MG PO TABS
800.0000 mg | ORAL_TABLET | Freq: Three times a day (TID) | ORAL | Status: DC
  Administered 2020-08-11 – 2020-08-13 (×8): 800 mg via ORAL
  Filled 2020-08-11 (×10): qty 1

## 2020-08-11 MED ORDER — PANCRELIPASE (LIP-PROT-AMYL) 12000-38000 UNITS PO CPEP
48000.0000 [IU] | ORAL_CAPSULE | Freq: Three times a day (TID) | ORAL | Status: DC
  Administered 2020-08-11 – 2020-08-13 (×8): 48000 [IU] via ORAL
  Filled 2020-08-11: qty 4
  Filled 2020-08-11 (×3): qty 1
  Filled 2020-08-11 (×2): qty 4
  Filled 2020-08-11 (×2): qty 1
  Filled 2020-08-11 (×2): qty 4

## 2020-08-11 MED ORDER — SODIUM CHLORIDE 0.9% FLUSH
3.0000 mL | Freq: Two times a day (BID) | INTRAVENOUS | Status: DC
  Administered 2020-08-11 – 2020-08-13 (×4): 3 mL via INTRAVENOUS

## 2020-08-11 MED ORDER — TRAZODONE HCL 50 MG PO TABS
50.0000 mg | ORAL_TABLET | Freq: Every day | ORAL | Status: DC
  Administered 2020-08-11 – 2020-08-12 (×2): 50 mg via ORAL
  Filled 2020-08-11 (×2): qty 1

## 2020-08-11 MED ORDER — LEVOTHYROXINE SODIUM 100 MCG PO TABS
100.0000 ug | ORAL_TABLET | Freq: Every day | ORAL | Status: DC
  Administered 2020-08-11: 100 ug via ORAL
  Filled 2020-08-11: qty 1

## 2020-08-11 MED ORDER — SODIUM CHLORIDE 0.9 % IV SOLN
250.0000 mL | INTRAVENOUS | Status: DC | PRN
Start: 2020-08-11 — End: 2020-08-13

## 2020-08-11 MED ORDER — HEPARIN (PORCINE) 25000 UT/250ML-% IV SOLN
800.0000 [IU]/h | INTRAVENOUS | Status: DC
  Administered 2020-08-11: 700 [IU]/h via INTRAVENOUS
  Filled 2020-08-11 (×2): qty 250

## 2020-08-11 MED ORDER — GENTAMICIN SULFATE 0.1 % EX CREA
1.0000 "application " | TOPICAL_CREAM | Freq: Every day | CUTANEOUS | Status: DC
  Administered 2020-08-11 – 2020-08-13 (×4): 1 via TOPICAL
  Filled 2020-08-11: qty 15

## 2020-08-11 MED ORDER — INSULIN ASPART 100 UNIT/ML ~~LOC~~ SOLN
0.0000 [IU] | Freq: Every day | SUBCUTANEOUS | Status: DC
  Administered 2020-08-11: 3 [IU] via SUBCUTANEOUS
  Administered 2020-08-12: 4 [IU] via SUBCUTANEOUS

## 2020-08-11 MED ORDER — ASPIRIN EC 81 MG PO TBEC
81.0000 mg | DELAYED_RELEASE_TABLET | Freq: Every day | ORAL | Status: DC
  Administered 2020-08-12: 81 mg via ORAL
  Filled 2020-08-11: qty 1

## 2020-08-11 MED ORDER — HEPARIN BOLUS VIA INFUSION
2500.0000 [IU] | Freq: Once | INTRAVENOUS | Status: AC
  Administered 2020-08-11: 2500 [IU] via INTRAVENOUS
  Filled 2020-08-11: qty 2500

## 2020-08-11 MED ORDER — HEPARIN 1000 UNIT/ML FOR PERITONEAL DIALYSIS
500.0000 [IU] | INTRAMUSCULAR | Status: DC | PRN
Start: 2020-08-11 — End: 2020-08-11

## 2020-08-11 NOTE — Progress Notes (Signed)
Attempted to call HD to arrange PD set up and initiation of PD. Will try again.

## 2020-08-11 NOTE — Progress Notes (Signed)
PROGRESS NOTE    Samantha Conrad  FKC:[id number] DOB: Aug 19, 1940 DOA: 08/10/2020 PCP: [facility]    Chief Complaint  Patient presents with  . Shortness of Breath  . Altered Mental Status    Brief Narrative:  Samantha Conrad is a 80 y.o. 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, ESRD on peritoneal dialysis who presents with complaint of short of breath , hemoptysis ,fatigue, weakness over past few days with confusion prior to admission  Subjective:  subcutanoues hematoma /bleeding at prior IV site  She is on room air at rest, not in respiratory distress, she appear volume overloaded No fever Currently baseline impaired memory, no new confusion  Assessment & Plan:   Principal Problem:   NSTEMI (non-ST elevated myocardial infarction) ([place]) Active Problems:   Essential hypertension   ESRD on dialysis ([place])   CAD S/P percutaneous coronary angioplasty   CAP (community acquired pneumonia)   DM (diabetes mellitus), type 2 with renal complications ([place])  Short of breath, hemoptysis ,acute hypoxic respiratory failure -O2 88% on room air on presentation, she was put on 2 L oxygen -she appears volume overloaded, chest x-ray "Mild left basilar atelectasis and small left pleural effusion" -There is no fever, no leukocytosis, low threshold to stop antibiotics, will add on urine Legionella antigen test and urine strep pneumo antigen test, will check procalcitonin  Elevated troponin -History of CAD with stents placed a few years ago.  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 Continue aspirin, Lipitor She reports does not drive  Hypothyroidism Continue Synthroid     Unresulted Labs (From admission, onward)          Start     Ordered   08/12/20 0500  Procalcitonin  Daily,   R      08/11/20 0756   08/12/20 0500  Heparin level (unfractionated)  Daily,   R      08/11/20 1127   08/12/20 0500  CBC  Daily,   R      08/11/20 1127   08/11/20 1700  Heparin level (unfractionated)  Once-Timed,   STAT        08/11/20 1127   08/11/20 0755  Legionella Pneumophila Serogp 1 Ur Ag  Once,   STAT        08/11/20 0754   08/11/20 0755  Strep pneumoniae urinary antigen  Once,   STAT        08/11/20 0754   Signed and Held  Basic metabolic panel  Tomorrow morning,   R        Signed and Held            DVT prophylaxis: On heparin drip   Code Status: Currently full code, per chart review she was DNR in the past, will need palliative care consult for goals of care discussion Family Communication: Patient Disposition:   Status is: Inpatient   Dispo: The patient is from: home  Anticipated d/c is to: TBD              Anticipated d/c date is: TBD              Patient currently not medically ready to discharge  Consultants:   Cardiology  Nephrology  Palliative care  Procedures:   None  Antimicrobials:    Anti-infectives (From admission, onward)   Start     Dose/Rate Route Frequency Ordered Stop   08/11/20 1000  doxycycline (VIBRA-TABS) tablet 100 mg        100 mg Oral Every 12 hours 08/11/20 0526     08/11/20 0530  cefTRIAXone (ROCEPHIN) 1 g in sodium chloride 0.9 % 100 mL IVPB        1 g 200 mL/hr over 30 Minutes Intravenous Every 24 hours 08/11/20 0526      08/11/20 0045  cefTRIAXone (ROCEPHIN) 1 g in sodium chloride 0.9 % 100 mL IVPB        1 g 200 mL/hr over 30 Minutes Intravenous  Once 08/11/20 0031 08/11/20 0131   08/11/20 0045  azithromycin (ZITHROMAX) 500 mg in sodium chloride 0.9 % 250 mL IVPB        500 mg 250 mL/hr over 60 Minutes Intravenous  Once 08/11/20 0031 08/11/20 0243         Objective: Vitals:   08/11/20 0345 08/11/20 0700 08/11/20 0803 08/11/20 1000  BP: (!) 119/55 (!) 145/51 (!) 132/50 (!) 130/54  Pulse: 62 64 63 65  Resp: 10 12 13 16   Temp: 97.6 F (36.4 C)  97.9 F (36.6 C)   TempSrc: Oral  Oral   SpO2: 100% 99% 100% 99%   No intake or output data in the 24 hours ending 08/11/20 1237 There were no vitals filed for this visit.  Examination:  General exam: frail, chronically ill appearing, calm, NAD Respiratory system: diminished at basis, no wheezing, no rales, no rhonchi,. Respiratory effort normal. Cardiovascular system: S1 & S2 heard, RRR.  Gastrointestinal system: Abdomen is nondistended, soft and nontender. Well healed prior surgical scar. Normal bowel sounds heard. Central nervous system: Alert and orientedx2. Confused about the time, visual impairment reports at baseline Extremities:left forearm edema, pressure dressing to prior iv site, bleeding appear has stopped Skin: No rashes, lesions or ulcers Psychiatry: Judgement and insight appear normal. Mood & affect appropriate.     Data Reviewed: I have personally reviewed following labs and imaging studies  CBC: Recent Labs  Lab 08/10/20 2014 08/11/20 0028 08/11/20 1039  WBC 4.1  --  4.7  HGB 11.5* 10.2* 10.4*  HCT 38.9 30.0* 34.3*  MCV 93.1  --  91.7  PLT 82*  --  75*    Basic Metabolic Panel: Recent Labs  Lab 08/10/20 2014 08/11/20 0028  NA 137 134*  K 4.2 3.7  CL 98  --   CO2 24  --   GLUCOSE 88  --   BUN 53*  --   CREATININE 5.91*  --   CALCIUM 9.5  --     GFR: CrCl cannot be calculated (Unknown ideal weight.).  Liver  Function Tests: Recent Labs  Lab 08/10/20 2014  AST 22  ALT 22  ALKPHOS 127*  BILITOT 0.8  PROT 6.3*  ALBUMIN 2.9*    CBG: Recent Labs  Lab 08/11/20 0812 08/11/20 0850 08/11/20 1150  GLUCAP 66* 74 119*     No results found for this or any previous visit (from the past 240 hour(s)).  Radiology Studies: CT Head Wo Contrast  Result Date: 08/10/2020 CLINICAL DATA:  Mental status change.  Unknown cause. EXAM: CT HEAD WITHOUT CONTRAST TECHNIQUE: Contiguous axial images were obtained from the base of the skull through the vertex without intravenous contrast. COMPARISON:  CT head 05/04/2017 FINDINGS: Brain: Cerebral ventricle sizes are concordant with the degree of cerebral volume loss. Patchy and confluent areas of decreased attenuation are noted throughout the deep and periventricular white matter of the cerebral hemispheres bilaterally, compatible with chronic microvascular ischemic disease. Prior left occipital infarction. No evidence of large-territorial acute infarction. No parenchymal hemorrhage. No mass lesion. No extra-axial collection. No mass effect or midline shift. No hydrocephalus. Basilar cisterns are patent. Vascular: No hyperdense vessel. Atherosclerotic calcifications are present within the cavernous internal carotid arteries. Skull: No acute fracture or focal lesion. Sinuses/Orbits: Paranasal sinuses and mastoid air cells are clear. Bilateral lens replacement. Otherwise the orbits are unremarkable. Other: None. IMPRESSION: No acute intracranial abnormality. Electronically Signed   By: Iven Finn M.D.   On: 08/10/2020 23:15   DG Chest Port 1 View  Result Date: 08/10/2020 CLINICAL DATA:  Shortness of breath. EXAM: PORTABLE CHEST 1 VIEW COMPARISON:  03/19/2020 FINDINGS: Mild cardiomegaly. Aortic atherosclerosis. Streaky retrocardiac left lung base opacities. Suspected small pleural effusions. No pulmonary edema or pneumothorax. No acute osseous abnormalities are  seen. IMPRESSION: 1. Streaky retrocardiac left lung base opacities, atelectasis versus pneumonia. 2. Suspected small pleural effusions. Electronically Signed   By: Keith Rake M.D.   On: 08/10/2020 23:06   DG Abdomen Acute W/Chest  Result Date: 08/11/2020 CLINICAL DATA:  Shortness of breath and altered mental status. EXAM: DG ABDOMEN ACUTE WITH 1 VIEW CHEST COMPARISON:  July 13, 2020 FINDINGS: There is no evidence of dilated bowel loops or free intraperitoneal air. A peritoneal dialysis catheter is seen and unchanged in position. No radiopaque calculi are seen. Stable 1.5 cm and 0.4 cm soft tissue calcifications are seen adjacent to the L2 vertebral body on the right and correspond to calcified lymph nodes noted within this on prior imaging. Radiopaque surgical clips are seen overlying the right upper quadrant. Heart size and mediastinal contours are within normal limits. Very mild atelectasis is seen within the left lung base. There is a small left pleural effusion. IMPRESSION: 1. Nonobstructive bowel gas pattern. 2. Mild left basilar atelectasis and small left pleural effusion. Electronically Signed   By: Virgina Norfolk M.D.   On: 08/11/2020 00:00        Scheduled Meds: . aspirin  81 mg Oral Daily  . [START ON 08/12/2020] aspirin EC  81 mg Oral Daily  . atorvastatin  40 mg Oral q1800  . carvedilol  3.125 mg Oral BID WC  . doxycycline  100 mg Oral Q12H  . gentamicin cream  1 application Topical Daily  . insulin aspart  0-5 Units Subcutaneous QHS  . insulin aspart  0-9 Units Subcutaneous TID WC  . insulin glargine  12 Units Subcutaneous QHS  . levothyroxine  100 mcg Oral QAC breakfast  . linagliptin  5 mg Oral Q breakfast  . lipase/protease/amylase  48,000 Units Oral TID WC  . pantoprazole  40 mg Oral Q breakfast  . sevelamer carbonate  800 mg Oral TID WC  . sodium chloride flush  3 mL Intravenous Q12H  . traZODone  50 mg Oral QHS   Continuous Infusions: . sodium chloride     . cefTRIAXone (ROCEPHIN)  IV    . dialysis solution 4.25% low-MG/low-CA  LOS: 0 days   Time spent: 56mns Greater than 50% of this time was spent in counseling, explanation of diagnosis, planning of further management, and coordination of care.   Voice Recognition /Viviann Sparedictation system was used to create this note, attempts have been made to correct errors. Please contact the author with questions and/or clarifications.   FFlorencia Reasons MD PhD FACP [facility]  Available via Epic secure chat 7am-7pm for nonurgent issues Please page for urgent issues To page the attending provider between 7A-7P or the covering provider during after hours 7P-7A, please log into the web site [email or web address] and access using universal [place] password for that web site. If you do not have the password, please call the hospital operator.    08/11/2020, 12:37 PM

## 2020-08-11 NOTE — Consult Note (Cosign Needed)
Cardiology Consultation:   Patient ID: Samantha Conrad MRN: [id number]; DOB: [birth year]  Admit date: 08/10/2020 Date of Consult: 08/11/2020  PCP:  [facility]  Cardiologist:  Shelva Majestic, MD   Patient Profile:   Samantha Conrad is a 80 y.o. female with a hx of CAD status post DES to circumflex, end-stage renal disease on peritoneal dialysis, diabetes mellitus, cirrhosis with NASH, hypertension, stroke, seizure disorder and hypothyroidism who is being seen today for the evaluation of elevated troponin at the request of Dr. Erlinda Hong.   History of STEMI 08/2018. She was found to have thrombotic occlusion of the proximal circumflex as well as the continuation branch of the AV groove.  She had a normal LAD and large normal dominant RCA.  She underwent stenting of the proximal circumflex with an excellent result but had a suboptimal result at the AV groove circumflex with residual narrowing of 50%. LVEF of 50-55% at that time.   Last seen by Kerin Ransom July 27, 2020.  She was doing well on cardiac standpoint.  After discussion with Dr. Claiborne Billings aspirin discontinued and continued Plavix. She was having some GI issue.   History of Present Illness:   Samantha Conrad not not felt well yesterday.  Reported fatigue and tiredness.  Patient was noted confuse by family in urine during/around dialysis.  Reported bilateral shoulder pain, more on left side radiating to her upper back for past few days.  She did feel some shortness of breath and sweating.  Does not remember exact timing.  No chest pain.  She has been coughing up blood for past few days.  Due to worsening confusion from baseline EMS was called and brought to ER for further evaluation.  Chest x-ray showed left lower lobe pneumonia versus atelectasis. CT of head without acute finding Abdominal x-ray nonconclusive Creatinine 5.19 Potassium 4.2 Albumin 2.9 Hemoglobin 11.5 Lipase normal Lactic  acid 1.4 High-sensitivity troponin 237>>215  She was admitted for community-acquired pneumonia and elevated troponin.  Treated with IV Rocephin and azithromycin.  Started on IV heparin.  No palpitation, orthopnea, PND or melena.  Mostly sedentary lifestyle.  She makes very letter urine.  She has tried torsemide in the past.   Patient was seen by nephrology this morning.  Discontinued torsemide.  She has refused hemodialysis.  Recommended palliative care consult.   Past Medical History:  Diagnosis Date  . Anemia   . Basal cell carcinoma     on nose/face  . CAD (coronary artery disease)    a. 08/2018: posterior STEMI s/p DES to LCx x2  . Depression   . ESRD on peritoneal dialysis ([place])    "q night" (11/29/2017)  . Fatty liver   . Hypertension   . Hypothyroidism   . NASH (nonalcoholic steatohepatitis)   . Seizures ([place])    ? when she may have had a stroke sometime in the spring of 2018  . Sleep apnea    diagnosed years ago "after I'd gained alot of weight;  no longer a problem since weight loss"  . Stroke [facility]) 04/2016   memory issues, lost 1/2 vision in both eyes  . Thrombocytopenia ([place])   . Thyroid disease   . Type II diabetes mellitus ([place])     Past Surgical History:  Procedure Laterality Date  . ABDOMINAL HYSTERECTOMY    . AV FISTULA PLACEMENT Right 10/30/2016   Procedure: INSERTION OF ARTERIOVENOUS (AV) GORE-TEX GRAFT RIGHT UPPER ARM;  Surgeon: Oneida Alar,  Jessy Oto, MD;  Location: [facility];  Service: Vascular;  Laterality: Right;  . CATARACT EXTRACTION W/ INTRAOCULAR LENS  IMPLANT, BILATERAL Bilateral   . CHOLECYSTECTOMY OPEN    . CORNEAL TRANSPLANT Bilateral   . CORONARY ANGIOGRAPHY N/A 09/06/2018   Procedure: CORONARY ANGIOGRAPHY;  Surgeon: Lorretta Harp, MD;  Location: [facility];  Service: Cardiovascular;  Laterality: N/A;  . CORONARY/GRAFT ACUTE MI REVASCULARIZATION N/A 09/06/2018   Procedure: Coronary/Graft Acute MI Revascularization;  Surgeon: Lorretta Harp,  MD;  Location: [facility];  Service: Cardiovascular;  Laterality: N/A;  . EYE SURGERY    . KNEE ARTHROSCOPY Left   . LEFT HEART CATH AND CORONARY ANGIOGRAPHY N/A 09/06/2018   Procedure: LEFT HEART CATH AND CORONARY ANGIOGRAPHY;  Surgeon: Lorretta Harp, MD;  Location: [facility];  Service: Cardiovascular;  Laterality: N/A;  . MOHS SURGERY     "nose"  . SHOULDER OPEN ROTATOR CUFF REPAIR Left   . THROMBECTOMY AND REVISION OF ARTERIOVENTOUS (AV) GORETEX  GRAFT Right 12/04/2016   Procedure: THROMBECTOMY/ REVISION OF RIGHT UPPER ARM ARTERIOVENOUS GORETEX GRAFT;  Surgeon: Elam Dutch, MD;  Location: [facility];  Service: Vascular;  Laterality: Right;  . TONSILLECTOMY      Inpatient Medications: Scheduled Meds: . aspirin  81 mg Oral Daily  . [START ON 08/12/2020] aspirin EC  81 mg Oral Daily  . atorvastatin  40 mg Oral q1800  . carvedilol  3.125 mg Oral BID WC  . doxycycline  100 mg Oral Q12H  . gentamicin cream  1 application Topical Daily  . insulin aspart  0-5 Units Subcutaneous QHS  . insulin aspart  0-9 Units Subcutaneous TID WC  . insulin glargine  12 Units Subcutaneous QHS  . levothyroxine  100 mcg Oral QAC breakfast  . linagliptin  5 mg Oral Q breakfast  . lipase/protease/amylase  48,000 Units Oral TID WC  . pantoprazole  40 mg Oral Q breakfast  . sevelamer carbonate  800 mg Oral TID WC  . sodium chloride flush  3 mL Intravenous Q12H  . traZODone  50 mg Oral QHS   Continuous Infusions: . sodium chloride    . cefTRIAXone (ROCEPHIN)  IV    . dialysis solution 4.25% low-MG/low-CA     PRN Meds: sodium chloride, acetaminophen, heparin, nitroGLYCERIN, sodium chloride flush  Allergies:    Allergies  Allergen Reactions  . Contrast Media [Iodinated Diagnostic Agents] Swelling    SWELLING REACTION UNSPECIFIED   . Tape Other (See Comments) and Dermatitis    Tears skin - please use paper tape Paper tape only    Social History:   Social History   Socioeconomic  History  . Marital status: Widowed    Spouse name: Not on file  . Number of children: Not on file  . Years of education: Not on file  . Highest education level: Not on file  Occupational History  . Not on file  Tobacco Use  . Smoking status: Never Smoker  . Smokeless tobacco: Former Systems developer    Types: Snuff  . Tobacco comment: "no snuff since < 1980s"  Vaping Use  . Vaping Use: Never used  Substance and Sexual Activity  . Alcohol use: Never  . Drug use: Never  . Sexual activity: Not Currently  Other Topics Concern  . Not on file  Social History Narrative  . Not on file   Social Determinants of Health   Financial Resource Strain: Not on file  Food Insecurity:  Not on file  Transportation Needs: Not on file  Physical Activity: Not on file  Stress: Not on file  Social Connections: Not on file  Intimate Partner Violence: Not on file    Family History:   Family History  Problem Relation Age of Onset  . Tuberculosis Mother      ROS:  Please see the history of present illness.  All other ROS reviewed and negative.     Physical Exam/Data:   Vitals:   08/11/20 0345 08/11/20 0700 08/11/20 0803 08/11/20 1000  BP: (!) 119/55 (!) 145/51 (!) 132/50 (!) 130/54  Pulse: 62 64 63 65  Resp: 10 12 13 16   Temp: 97.6 F (36.4 C)  97.9 F (36.6 C)   TempSrc: Oral  Oral   SpO2: 100% 99% 100% 99%   No intake or output data in the 24 hours ending 08/11/20 1449 Last 3 Weights 07/27/2020 03/19/2020 01/21/2020  Weight (lbs) 163 lb 9.6 oz 145 lb 141 lb 3.2 oz  Weight (kg) 74.208 kg 65.772 kg 64.048 kg     There is no height or weight on file to calculate BMI.  General:  Well nourished, well developed, in no acute distress HEENT: normal Lymph: no adenopathy Neck: no JVD Endocrine:  No thryomegaly Vascular: No carotid bruits; FA pulses 2+ bilaterally without bruits  Cardiac:  normal S1, S2; RRR; no murmur  Lungs:  clear to auscultation bilaterally, no wheezing, rhonchi or rales  Abd:  soft, nontender, no hepatomegaly  Ext: trace to 1+ BL edema Musculoskeletal:  No deformities, BUE and BLE strength normal and equal Skin: warm and dry  Neuro:  CNs 2-12 intact, no focal abnormalities noted Psych:  Normal affect   EKG:  The EKG was personally reviewed and demonstrates:  NSR Telemetry:  Telemetry was personally reviewed and demonstrates:  NSR  Relevant CV Studies:  Echo 08/2018 1. The left ventricle has low normal systolic function, with an ejection  fraction of 50-55%. The cavity size was normal. Left ventricular diastolic  Doppler parameters are consistent with pseudonormalization. Elevated left  ventricular end-diastolic  pressure.  2. Distal septal and posterior lateral hypokinesis.  3. The right ventricle has normal systolic function. The cavity was  normal. There is no increase in right ventricular wall thickness.  4. Trivial pericardial effusion is present.  5. Mild thickening of the mitral valve leaflet. Mild calcification of the  mitral valve leaflet. Mitral valve regurgitation is mild to moderate by  color flow Doppler.  6. The aortic valve is tricuspid. Moderate thickening of the aortic  valve. Moderate calcification of the aortic valve. Aortic valve  regurgitation is mild by color flow Doppler.  7. The aortic root is normal in size and structure.  8. The interatrial septum was not well visualized.   Coronary/Graft Acute MI Revascularization  08/2018  LEFT HEART CATH AND CORONARY ANGIOGRAPHY    Conclusion    Ost Cx to Prox Cx lesion is 99% stenosed.  Prox Cx lesion is 100% stenosed.  A drug-eluting stent was successfully placed.  Post intervention, there is a 0% residual stenosis.  A stent was successfully placed.  Post intervention, there is a 50% residual stenosis.  Diagnostic Dominance: Right    Intervention     Laboratory Data:  High Sensitivity Troponin:   Recent Labs  Lab 08/10/20 2329 08/11/20 0129  TROPONINIHS  237* 215*     Chemistry Recent Labs  Lab 08/10/20 2014 08/11/20 0028  NA 137 134*  K 4.2 3.7  CL 98  --   CO2 24  --   GLUCOSE 88  --   BUN 53*  --   CREATININE 5.91*  --   CALCIUM 9.5  --   GFRNONAA 7*  --   ANIONGAP 15  --     Recent Labs  Lab 08/10/20 2014  PROT 6.3*  ALBUMIN 2.9*  AST 22  ALT 22  ALKPHOS 127*  BILITOT 0.8   Hematology Recent Labs  Lab 08/10/20 2014 08/11/20 0028 08/11/20 1039  WBC 4.1  --  4.7  RBC 4.18  --  3.74*  HGB 11.5* 10.2* 10.4*  HCT 38.9 30.0* 34.3*  MCV 93.1  --  91.7  MCH 27.5  --  27.8  MCHC 29.6*  --  30.3  RDW 15.9*  --  15.9*  PLT 82*  --  75*   Radiology/Studies:  CT Head Wo Contrast  Result Date: 08/10/2020 CLINICAL DATA:  Mental status change.  Unknown cause. EXAM: CT HEAD WITHOUT CONTRAST TECHNIQUE: Contiguous axial images were obtained from the base of the skull through the vertex without intravenous contrast. COMPARISON:  CT head 05/04/2017 FINDINGS: Brain: Cerebral ventricle sizes are concordant with the degree of cerebral volume loss. Patchy and confluent areas of decreased attenuation are noted throughout the deep and periventricular white matter of the cerebral hemispheres bilaterally, compatible with chronic microvascular ischemic disease. Prior left occipital infarction. No evidence of large-territorial acute infarction. No parenchymal hemorrhage. No mass lesion. No extra-axial collection. No mass effect or midline shift. No hydrocephalus. Basilar cisterns are patent. Vascular: No hyperdense vessel. Atherosclerotic calcifications are present within the cavernous internal carotid arteries. Skull: No acute fracture or focal lesion. Sinuses/Orbits: Paranasal sinuses and mastoid air cells are clear. Bilateral lens replacement. Otherwise the orbits are unremarkable. Other: None. IMPRESSION: No acute intracranial abnormality. Electronically Signed   By: Iven Finn M.D.   On: 08/10/2020 23:15   DG Chest Port 1  View  Result Date: 08/10/2020 CLINICAL DATA:  Shortness of breath. EXAM: PORTABLE CHEST 1 VIEW COMPARISON:  03/19/2020 FINDINGS: Mild cardiomegaly. Aortic atherosclerosis. Streaky retrocardiac left lung base opacities. Suspected small pleural effusions. No pulmonary edema or pneumothorax. No acute osseous abnormalities are seen. IMPRESSION: 1. Streaky retrocardiac left lung base opacities, atelectasis versus pneumonia. 2. Suspected small pleural effusions. Electronically Signed   By: Keith Rake M.D.   On: 08/10/2020 23:06   DG Abdomen Acute W/Chest  Result Date: 08/11/2020 CLINICAL DATA:  Shortness of breath and altered mental status. EXAM: DG ABDOMEN ACUTE WITH 1 VIEW CHEST COMPARISON:  July 13, 2020 FINDINGS: There is no evidence of dilated bowel loops or free intraperitoneal air. A peritoneal dialysis catheter is seen and unchanged in position. No radiopaque calculi are seen. Stable 1.5 cm and 0.4 cm soft tissue calcifications are seen adjacent to the L2 vertebral body on the right and correspond to calcified lymph nodes noted within this on prior imaging. Radiopaque surgical clips are seen overlying the right upper quadrant. Heart size and mediastinal contours are within normal limits. Very mild atelectasis is seen within the left lung base. There is a small left pleural effusion. IMPRESSION: 1. Nonobstructive bowel gas pattern. 2. Mild left basilar atelectasis and small left pleural effusion. Electronically Signed   By: Virgina Norfolk M.D.   On: 08/11/2020 00:00     Assessment and Plan:   1. Elevated troponin - High-sensitivity troponin 237>>215.  Not ACS pattern.  EKG without acute ischemic changes  Also, not associated with anginal type  chest pain -Likely demand in setting of acute confusion, end-stage renal disease and pneumonia -Continue heparin for now -not unreasonable to treat for 24 to 48 hours, but usually not beneficial. -Pending echocardiogram  2. CAD s/p DES x 2 to  Cx -this was in the setting of true anginal symptoms/MI.  She did not TAVR very different than what she is currently feeling.  Having more of a central chest discomfort associate with significant dyspnea, nausea and diaphoresis.  Current symptoms are more discomfort across the shoulders is more sharp and stabbing.  No nausea, no dyspnea. -Last cardiac catheterization 08/2018 as above -Per last office visit plan to discontinue aspirin and only continue Plavix after discussion with Dr. Claiborne Billings -We will give antiplatelets recommendation>> currently on aspirin 81 mg  -Continue Lipitor 40 mg daily and Coreg 3.125 mg twice daily  3.  Lower extremity edema 4.  End-stage renal disease on peritoneal dialysis -Torsemide discontinued by nephrology -Patient not interested in hemodialysis -Volume management per nephrology  5.  Confusion -Seems resolved -At baseline patient has some memory issue  6. CAP - per primary team  7. DM - HgbA1c 6.3 - Per primary team   8. HLD - 08/11/2020: Cholesterol 113; HDL 54; LDL Cholesterol 41; Triglycerides 88; VLDL 18  - LDL at goal of less than 70 - Continue Lipitor 1m qd   Dr HEllyn Hackto see.   Risk Assessment/Risk Scores:   TIMI Risk Score for Unstable Angina or Non-ST Elevation MI:   The patient's TIMI risk score is 5, which indicates a 26% risk of all cause mortality, new or recurrent myocardial infarction or need for urgent revascularization in the next 14 days.{   For questions or updates, please contact CMartinsvillePlease consult [email or web address] for contact info under    SJarrett Soho PUtah 08/11/2020 2:49 PM    ATTENDING ATTESTATION  I have seen, examined and evaluated the patient this PM along with Mr. BCurly Shores PUtah  After reviewing all the available data and chart, we discussed the patients laboratory, study & physical findings as well as symptoms in detail. I agree with his findings, examination as well as impression  recommendations as per our discussion.    Attending adjustments noted in italics.   Ms. LMarcelino Scotpresents here today with several usual symptoms, none of which are consistent with her prior anginal type symptoms.  She has been found to have possible community-acquired pneumonia.  She is on peritoneal dialysis at baseline.  Her troponin level has a downward trend which is not an ACS trajectory.  My suspicion this is is not an ACS presentation..  Probably more elevated correlation to end-stage renal disease as well as ongoing pneumonia.  Could be consistent with a potential Type II (non-ACS) MI.  This tends to be more associated with demand ischemia.  Elevated prone does act is a poor prognostic indicator, but is not indicated of true active myocardial infarction from ischemia.  Not unreasonable to follow trend of troponins for a longer period time and continue with heparin during that duration, but no benefit is derived from treating beyond that state time.  Not unreasonable to check an echocardiogram just to get better assessment of EF as well as potential change in wall motion.  No changes to medication recommendations.  [facility] will sign off.   Medication Recommendations: No new changes Other recommendations (labs, testing, etc): Not unreasonable follow-up echocardiogram-we will pay attention ensure that no major changes, but otherwise no new recommendations. Follow  up as an outpatient: As really scheduled.  If there is significant change in her functional status, we can reschedule for earlier follow-up.     Glenetta Hew, M.D., M.S. Interventional Cardiologist   Pager # [phone] Phone # [phone] [address] [place],  [address]

## 2020-08-11 NOTE — Consult Note (Signed)
[facility] Renal Consultation Note    Indication for Consultation:  Management of ESRD/hemodialysis; anemia, hypertension/volume and secondary hyperparathyroidism PCP: [place] Nephrologist: Dr. Johnney Ou  HPI: Samantha Conrad is a 80 y.o. female on CCPD at [facility] followed by Dr. Johnney Ou. PMH: NASH, DMT2, HTN, CAD, CVA, peritonitis, mild dementia at baseline, SHPT, anemia of chronic disease. She has been having issues as OP with inability to remove volume D/T membrane failure, low albumin. UF has been variable.  She has been offered the choice of returning to hemodialysis which she refuses. She says she will continue PD until it is no longer effective then transition to Hospice.   She was brought to ED 08/10/2020 with C/O confusion, fatigue and SOB. She says she was setting up to do her PD last PM and became, weak, SOB and so "out of it" that she couldn't talk to her daughter. She was brought to ED for evaluation. Labs basically unremarkable for ESRD patient. WBC 4.1 K+ 4.2 Na 137 CO2 24. Troponin with flat trend. CXR with Streaky retrocardiac left lung base opacities, atelectasis versus pneumonia, suspected small pleural effusions. CT of head unremarkable. Had mild episode of hypoglycemia this AM. Mentally she has returned to baseline. She has been started on heparin gtt, and ABX per primary. She has been admitted for NSTEMI/CAP.  Seen in ED. She has edema from waist to her ankles, periorbital edema. She has been using 4.25% solution in an effort to remove more volume but this has not been entirely successful. She has tried Torsemide to facilitate lowering volume but she makes very little urine. Currently she is awake, alert, oriented X 3. Very pleasant and talkative. Denies CP/SOB but says she isn't doing anything so feels fine. Describes progressive weakness and fatigue with activity.   Past Medical History:  Diagnosis Date  . Anemia   . Basal cell  carcinoma     on nose/face  . CAD (coronary artery disease)    a. 08/2018: posterior STEMI s/p DES to LCx x2  . Depression   . ESRD on peritoneal dialysis ([place])    "q night" (11/29/2017)  . Fatty liver   . Hypertension   . Hypothyroidism   . NASH (nonalcoholic steatohepatitis)   . Seizures ([place])    ? when she may have had a stroke sometime in the spring of 2018  . Sleep apnea    diagnosed years ago "after I'd gained alot of weight;  no longer a problem since weight loss"  . Stroke [facility]) 04/2016   memory issues, lost 1/2 vision in both eyes  . Thrombocytopenia ([place])   . Thyroid disease   . Type II diabetes mellitus ([place])    Past Surgical History:  Procedure Laterality Date  . ABDOMINAL HYSTERECTOMY    . AV FISTULA PLACEMENT Right 10/30/2016   Procedure: INSERTION OF ARTERIOVENOUS (AV) GORE-TEX GRAFT RIGHT UPPER ARM;  Surgeon: Elam Dutch, MD;  Location: [place];  Service: Vascular;  Laterality: Right;  . CATARACT EXTRACTION W/ INTRAOCULAR LENS  IMPLANT, BILATERAL Bilateral   . CHOLECYSTECTOMY OPEN    . CORNEAL TRANSPLANT Bilateral   . CORONARY ANGIOGRAPHY N/A 09/06/2018   Procedure: CORONARY ANGIOGRAPHY;  Surgeon: Lorretta Harp, MD;  Location: [place] [facility];  Service: Cardiovascular;  Laterality: N/A;  . CORONARY/GRAFT ACUTE MI REVASCULARIZATION N/A 09/06/2018   Procedure: Coronary/Graft Acute MI Revascularization;  Surgeon: Lorretta Harp, MD;  Location: [place] [facility];  Service: Cardiovascular;  Laterality: N/A;  . EYE SURGERY    . KNEE ARTHROSCOPY Left   . LEFT HEART CATH AND CORONARY ANGIOGRAPHY N/A 09/06/2018   Procedure: LEFT HEART CATH AND CORONARY ANGIOGRAPHY;  Surgeon: Lorretta Harp, MD;  Location: [place] [facility];  Service: Cardiovascular;  Laterality: N/A;  . MOHS SURGERY     "nose"  . SHOULDER OPEN ROTATOR CUFF REPAIR Left   . THROMBECTOMY AND REVISION OF ARTERIOVENTOUS (AV) GORETEX  GRAFT Right 12/04/2016   Procedure: THROMBECTOMY/ REVISION OF  RIGHT UPPER ARM ARTERIOVENOUS GORETEX GRAFT;  Surgeon: Elam Dutch, MD;  Location: [place];  Service: Vascular;  Laterality: Right;  . TONSILLECTOMY     Family History  Problem Relation Age of Onset  . Tuberculosis Mother    Social History:  reports that she has never smoked. She has quit using smokeless tobacco.  Her smokeless tobacco use included snuff. She reports that she does not drink alcohol and does not use drugs. Allergies  Allergen Reactions  . Contrast Media [Iodinated Diagnostic Agents] Swelling    SWELLING REACTION UNSPECIFIED   . Tape Other (See Comments) and Dermatitis    Tears skin - please use paper tape Paper tape only   Prior to Admission medications   Medication Sig Start Date End Date Taking? Authorizing Provider  acetaminophen-codeine (TYLENOL #3) 300-30 MG tablet Take 1-2 tablets by mouth every 6 (six) hours as needed for moderate pain.    [provider]  Amino Acids-Protein Hydrolys (FEEDING SUPPLEMENT, PRO-STAT SUGAR FREE 64,) LIQD Take 30 mLs by mouth 2 (two) times daily. 03/16/17   Doreatha Lew, MD  amLODipine (NORVASC) 5 MG tablet Take 1 tablet (5 mg total) by mouth daily. Patient taking differently: Take 5 mg by mouth at bedtime. 07/31/19 03/20/20  Troy Sine, MD  aspirin 81 MG chewable tablet Chew 1 tablet (81 mg total) by mouth daily. 09/08/18   Eileen [place], PA-C  atorvastatin (LIPITOR) 40 MG tablet TAKE 1 TABLET (40 MG TOTAL) BY MOUTH DAILY AT 6 PM. 01/13/20 03/20/20  Troy Sine, MD  carvedilol (COREG) 3.125 MG tablet Take 1 tablet (3.125 mg total) by mouth 2 (two) times daily with a meal. 08/02/20 09/01/20  Troy Sine, MD  citalopram (CELEXA) 10 MG tablet Take 10 mg by mouth daily with breakfast.  10/15/17   [provider]  clopidogrel (PLAVIX) 75 MG tablet TAKE 1 TABLET (75 MG TOTAL) BY MOUTH DAILY WITH BREAKFAST. 11/04/19   Troy Sine, MD  docusate sodium (COLACE) 100 MG capsule Take 100 mg by mouth daily  with lunch.    [provider]  gentamicin cream (GARAMYCIN) 0.1 % Apply 1 application topically daily as needed. 03/02/20   [provider]  glipiZIDE (GLUCOTROL) 5 MG tablet Take 5 mg by mouth 2 (two) times daily before a meal.  06/25/18   [provider]  lactulose, encephalopathy, (CHRONULAC) 10 GM/15ML SOLN Take 30 g by mouth once.    [provider]  LANTUS SOLOSTAR 100 UNIT/ML Solostar Pen Inject 12 Units into the skin at bedtime. 05/10/20   [provider]  levETIRAcetam (KEPPRA) 250 MG tablet Take 250 mg by mouth 2 (two) times daily with a meal.     [provider]  levothyroxine (SYNTHROID, LEVOTHROID) 100 MCG tablet Take 100 mcg by mouth daily before breakfast.  09/02/16   [provider]  linagliptin (TRADJENTA) 5 MG TABS tablet Take 5 mg by mouth daily with breakfast.  [provider]  lipase/protease/amylase (CREON) 12000-38000 units CPEP capsule Take 48,000 Units by mouth 3 (three) times daily with meals.    [provider]  Methoxy PEG-Epoetin Beta (MIRCERA IJ) Inject into the skin. 03/09/20   [provider]  multivitamin (PROSIGHT) TABS tablet Take 1 tablet by mouth daily. Patient taking differently: Take 1 tablet by mouth daily with breakfast. 03/17/17   Patrecia Pour, Christean Grief, MD  nitroGLYCERIN (NITROSTAT) 0.4 MG SL tablet Place 0.4 mg under the tongue every 5 (five) minutes as needed for chest pain.     [provider]  Nutritional Supplements (FEEDING SUPPLEMENT, NEPRO CARB STEADY,) LIQD Take 237 mLs by mouth daily. Patient taking differently: Take 237 mLs by mouth daily with lunch. 03/17/17   Patrecia Pour, Christean Grief, MD  pantoprazole (PROTONIX) 40 MG tablet Take 40 mg by mouth daily with breakfast.  08/21/18   [provider]  pioglitazone (ACTOS) 15 MG tablet Take 15 mg by mouth daily. 07/10/20   [provider]  polyethylene glycol (MIRALAX / GLYCOLAX) packet Take 17 g  by mouth daily.    [provider]  potassium chloride SA (KLOR-CON) 20 MEQ tablet Take 20 mEq by mouth daily.    [provider]  prednisoLONE acetate (PRED FORTE) 1 % ophthalmic suspension Place 1 drop into both eyes at bedtime.     [provider]  sevelamer carbonate (RENVELA) 800 MG tablet Take 800 mg by mouth 3 (three) times daily with meals.  07/04/18   [provider]  torsemide (DEMADEX) 100 MG tablet Take 100 mg by mouth daily. 03/17/20   [provider]  traZODone (DESYREL) 50 MG tablet Take 50 mg by mouth at bedtime.    [provider]   Current Facility-Administered Medications  Medication Dose Route Frequency Provider Last Rate Last Admin  . 0.9 %  sodium chloride infusion  250 mL Intravenous PRN Chotiner, Yevonne Aline, MD      . acetaminophen (TYLENOL) tablet 650 mg  650 mg Oral Q4H PRN Chotiner, Yevonne Aline, MD      . aspirin chewable tablet 81 mg  81 mg Oral Daily Chotiner, Yevonne Aline, MD   81 mg at 08/11/20 9470  . [START ON 08/12/2020] aspirin EC tablet 81 mg  81 mg Oral Daily Chotiner, Yevonne Aline, MD      . atorvastatin (LIPITOR) tablet 40 mg  40 mg Oral q1800 Chotiner, Yevonne Aline, MD      . carvedilol (COREG) tablet 3.125 mg  3.125 mg Oral BID WC Chotiner, Yevonne Aline, MD   3.125 mg at 08/11/20 0837  . cefTRIAXone (ROCEPHIN) 1 g in sodium chloride 0.9 % 100 mL IVPB  1 g Intravenous Q24H Chotiner, Yevonne Aline, MD      . doxycycline (VIBRA-TABS) tablet 100 mg  100 mg Oral Q12H Chotiner, Yevonne Aline, MD   100 mg at 08/11/20 0838  . heparin ADULT infusion 100 units/mL (25000 units/253m)  700 Units/hr Intravenous Continuous Chotiner, BYevonne Aline MD 7 mL/hr at 08/11/20 0159 700 Units/hr at 08/11/20 0159  . insulin aspart (novoLOG) injection 0-5 Units  0-5 Units Subcutaneous QHS Chotiner, BYevonne Aline MD      . insulin aspart (novoLOG) injection 0-9 Units  0-9 Units Subcutaneous TID WC Chotiner, BYevonne Aline MD      . insulin glargine (LANTUS)  injection 12 Units  12 Units Subcutaneous QHS Chotiner, BYevonne Aline MD      . levothyroxine (SYNTHROID) tablet 100 mcg  100 mcg Oral  QAC breakfast Chotiner, Yevonne Aline, MD   100 mcg at 08/11/20 6222  . linagliptin (TRADJENTA) tablet 5 mg  5 mg Oral Q breakfast Chotiner, Yevonne Aline, MD      . lipase/protease/amylase (CREON) capsule 48,000 Units  48,000 Units Oral TID WC Chotiner, Yevonne Aline, MD   48,000 Units at 08/11/20 0915  . nitroGLYCERIN (NITROSTAT) SL tablet 0.4 mg  0.4 mg Sublingual Q5 Min x 3 PRN Chotiner, Yevonne Aline, MD      . pantoprazole (PROTONIX) EC tablet 40 mg  40 mg Oral Q breakfast Chotiner, Yevonne Aline, MD   40 mg at 08/11/20 9798  . sevelamer carbonate (RENVELA) tablet 800 mg  800 mg Oral TID WC Chotiner, Yevonne Aline, MD   800 mg at 08/11/20 0837  . sodium chloride flush (NS) 0.9 % injection 3 mL  3 mL Intravenous Q12H Chotiner, Yevonne Aline, MD   3 mL at 08/11/20 0839  . sodium chloride flush (NS) 0.9 % injection 3 mL  3 mL Intravenous PRN Chotiner, Yevonne Aline, MD      . traZODone (DESYREL) tablet 50 mg  50 mg Oral QHS Chotiner, Yevonne Aline, MD       Current Outpatient Medications  Medication Sig Dispense Refill  . acetaminophen-codeine (TYLENOL #3) 300-30 MG tablet Take 1-2 tablets by mouth every 6 (six) hours as needed for moderate pain.    . Amino Acids-Protein Hydrolys (FEEDING SUPPLEMENT, PRO-STAT SUGAR FREE 64,) LIQD Take 30 mLs by mouth 2 (two) times daily. 900 mL 0  . amLODipine (NORVASC) 5 MG tablet Take 1 tablet (5 mg total) by mouth daily. (Patient taking differently: Take 5 mg by mouth at bedtime.) 90 tablet 3  . aspirin 81 MG chewable tablet Chew 1 tablet (81 mg total) by mouth daily.    Marland Kitchen atorvastatin (LIPITOR) 40 MG tablet TAKE 1 TABLET (40 MG TOTAL) BY MOUTH DAILY AT 6 PM. 90 tablet 2  . carvedilol (COREG) 3.125 MG tablet Take 1 tablet (3.125 mg total) by mouth 2 (two) times daily with a meal. 60 tablet 0  . citalopram (CELEXA) 10 MG tablet Take 10 mg by mouth daily with  breakfast.   1  . clopidogrel (PLAVIX) 75 MG tablet TAKE 1 TABLET (75 MG TOTAL) BY MOUTH DAILY WITH BREAKFAST. 90 tablet 3  . docusate sodium (COLACE) 100 MG capsule Take 100 mg by mouth daily with lunch.    Marland Kitchen gentamicin cream (GARAMYCIN) 0.1 % Apply 1 application topically daily as needed.    Marland Kitchen glipiZIDE (GLUCOTROL) 5 MG tablet Take 5 mg by mouth 2 (two) times daily before a meal.     . lactulose, encephalopathy, (CHRONULAC) 10 GM/15ML SOLN Take 30 g by mouth once.    Marland Kitchen LANTUS SOLOSTAR 100 UNIT/ML Solostar Pen Inject 12 Units into the skin at bedtime.    . levETIRAcetam (KEPPRA) 250 MG tablet Take 250 mg by mouth 2 (two) times daily with a meal.     . levothyroxine (SYNTHROID, LEVOTHROID) 100 MCG tablet Take 100 mcg by mouth daily before breakfast.   0  . linagliptin (TRADJENTA) 5 MG TABS tablet Take 5 mg by mouth daily with breakfast.     . lipase/protease/amylase (CREON) 12000-38000 units CPEP capsule Take 48,000 Units by mouth 3 (three) times daily with meals.    . Methoxy PEG-Epoetin Beta (MIRCERA IJ) Inject into the skin.    . multivitamin (PROSIGHT) TABS tablet Take 1 tablet by mouth daily. (Patient taking differently: Take 1 tablet by mouth  daily with breakfast.) 30 each 0  . nitroGLYCERIN (NITROSTAT) 0.4 MG SL tablet Place 0.4 mg under the tongue every 5 (five) minutes as needed for chest pain.     . Nutritional Supplements (FEEDING SUPPLEMENT, NEPRO CARB STEADY,) LIQD Take 237 mLs by mouth daily. (Patient taking differently: Take 237 mLs by mouth daily with lunch.)  0  . pantoprazole (PROTONIX) 40 MG tablet Take 40 mg by mouth daily with breakfast.     . pioglitazone (ACTOS) 15 MG tablet Take 15 mg by mouth daily.    . polyethylene glycol (MIRALAX / GLYCOLAX) packet Take 17 g by mouth daily.    . potassium chloride SA (KLOR-CON) 20 MEQ tablet Take 20 mEq by mouth daily.    . prednisoLONE acetate (PRED FORTE) 1 % ophthalmic suspension Place 1 drop into both eyes at bedtime.     .  sevelamer carbonate (RENVELA) 800 MG tablet Take 800 mg by mouth 3 (three) times daily with meals.     . torsemide (DEMADEX) 100 MG tablet Take 100 mg by mouth daily.    . traZODone (DESYREL) 50 MG tablet Take 50 mg by mouth at bedtime.     Labs: Basic Metabolic Panel: Recent Labs  Lab 08/10/20 2014 08/11/20 0028  NA 137 134*  K 4.2 3.7  CL 98  --   CO2 24  --   GLUCOSE 88  --   BUN 53*  --   CREATININE 5.91*  --   CALCIUM 9.5  --    Liver Function Tests: Recent Labs  Lab 08/10/20 2014  AST 22  ALT 22  ALKPHOS 127*  BILITOT 0.8  PROT 6.3*  ALBUMIN 2.9*   Recent Labs  Lab 08/10/20 2329  LIPASE 18   Recent Labs  Lab 08/10/20 2329  AMMONIA 22   CBC: Recent Labs  Lab 08/10/20 2014 08/11/20 0028  WBC 4.1  --   HGB 11.5* 10.2*  HCT 38.9 30.0*  MCV 93.1  --   PLT 82*  --    Cardiac Enzymes: No results for input(s): CKTOTAL, CKMB, CKMBINDEX, TROPONINI in the last 168 hours. CBG: Recent Labs  Lab 08/11/20 0812 08/11/20 0850  GLUCAP 66* 74   Iron Studies: No results for input(s): IRON, TIBC, TRANSFERRIN, FERRITIN in the last 72 hours. Studies/Results: CT Head Wo Contrast  Result Date: 08/10/2020 CLINICAL DATA:  Mental status change.  Unknown cause. EXAM: CT HEAD WITHOUT CONTRAST TECHNIQUE: Contiguous axial images were obtained from the base of the skull through the vertex without intravenous contrast. COMPARISON:  CT head 05/04/2017 FINDINGS: Brain: Cerebral ventricle sizes are concordant with the degree of cerebral volume loss. Patchy and confluent areas of decreased attenuation are noted throughout the deep and periventricular white matter of the cerebral hemispheres bilaterally, compatible with chronic microvascular ischemic disease. Prior left occipital infarction. No evidence of large-territorial acute infarction. No parenchymal hemorrhage. No mass lesion. No extra-axial collection. No mass effect or midline shift. No hydrocephalus. Basilar cisterns are  patent. Vascular: No hyperdense vessel. Atherosclerotic calcifications are present within the cavernous internal carotid arteries. Skull: No acute fracture or focal lesion. Sinuses/Orbits: Paranasal sinuses and mastoid air cells are clear. Bilateral lens replacement. Otherwise the orbits are unremarkable. Other: None. IMPRESSION: No acute intracranial abnormality. Electronically Signed   By: Iven Finn M.D.   On: 08/10/2020 23:15   DG Chest Port 1 View  Result Date: 08/10/2020 CLINICAL DATA:  Shortness of breath. EXAM: PORTABLE CHEST 1 VIEW COMPARISON:  03/19/2020 FINDINGS: Mild  cardiomegaly. Aortic atherosclerosis. Streaky retrocardiac left lung base opacities. Suspected small pleural effusions. No pulmonary edema or pneumothorax. No acute osseous abnormalities are seen. IMPRESSION: 1. Streaky retrocardiac left lung base opacities, atelectasis versus pneumonia. 2. Suspected small pleural effusions. Electronically Signed   By: Keith Rake M.D.   On: 08/10/2020 23:06   DG Abdomen Acute W/Chest  Result Date: 08/11/2020 CLINICAL DATA:  Shortness of breath and altered mental status. EXAM: DG ABDOMEN ACUTE WITH 1 VIEW CHEST COMPARISON:  July 13, 2020 FINDINGS: There is no evidence of dilated bowel loops or free intraperitoneal air. A peritoneal dialysis catheter is seen and unchanged in position. No radiopaque calculi are seen. Stable 1.5 cm and 0.4 cm soft tissue calcifications are seen adjacent to the L2 vertebral body on the right and correspond to calcified lymph nodes noted within this on prior imaging. Radiopaque surgical clips are seen overlying the right upper quadrant. Heart size and mediastinal contours are within normal limits. Very mild atelectasis is seen within the left lung base. There is a small left pleural effusion. IMPRESSION: 1. Nonobstructive bowel gas pattern. 2. Mild left basilar atelectasis and small left pleural effusion. Electronically Signed   By: Virgina Norfolk M.D.    On: 08/11/2020 00:00    ROS: As per HPI otherwise negative.   Physical Exam: Vitals:   08/11/20 0300 08/11/20 0345 08/11/20 0700 08/11/20 0803  BP: (!) 132/58 (!) 119/55 (!) 145/51 (!) 132/50  Pulse: 67 62 64 63  Resp: 11 10 12 13   Temp:  97.6 F (36.4 C)  97.9 F (36.6 C)  TempSrc:  Oral  Oral  SpO2: (S) (!) 88% 100% 99% 100%     General: Pleasant elderly female in no acute distress. Head: Normocephalic, atraumatic, sclera non-icteric, mucus membranes are moist Neck: Supple. JVD not elevated. Lungs: Bilateral breath sounds decreased in bases with few bibasilar crackles. No WOB.  Heart: RRR with S1 S2. No murmurs, rubs, or gallops appreciated.SR on monitor rare PVCs.  Abdomen: Soft, non-tender, non-distended with normoactive bowel sounds. No rebound/guarding. No obvious abdominal masses. PD catheter drsg intact.  M-S:  Strength and tone appear normal for age. Lower extremities: 1-2+ edema starting lower abdomen, hips, thighs, pretibial bilaterally.   Neuro: Alert and oriented X 3. Moves all extremities spontaneously. Psych:  Responds to questions appropriately with a normal affect. Dialysis Access:PD catheter intact.   Dialysis Orders: Center: [facility]. 7X Week, EDW 63.5 (kg) Ca 2.5 (mEq/L) Mg 0.5 (mEq/L), Dextrose 1.5; 2.5; 4.25 %, # Exchanges 5, fill vol 2250 mL cyc therapy vol 11250 mL cyc therapy time 10 hrs 0 min, Day Exchanges 0 -Heparin 6000 units IV 7 days a week   Assessment/Plan: 1. Volume overload/PD failure: Have been attempting as OP to lower volume using high concentration dianeal and torsemide. UF has been variable. Albumin 2.9 SCr 5.9 (probably underestimated). Will attempt to lower volume with all 4.25% dianeal solution tonight. Will not resume torsemide as this has been ineffective-makes very little urine. She refuses HD.  2.  NSTEMI-believe this is demand ischemia from volume excess. Cards consulted, Per Primary 3.  Possible CAP-does not appear  toxic. WBC 4.1 afebrile. Started ABX per primary.  4.  ESRD -  CCPD X 7 days. Will continue tonight.  5.  Hypertension/volume  -Well controlled. Edematous from waist to ankles. Please see # 1.  6.  Anemia  - HGB 11.5 on adm. Follow HGB.  7.  Metabolic bone disease -  Add RFP to  today's labs. OP labs at goal.  8.  Nutrition - Change to renal/carb mod diet with fluid restrictions. Low Albumin, add prosource supps.  9. DMT2-per primary. Use sensitive SS insulin.  10. GOC-Patient remains full code although she speaks freely about going to hospice if PD fails completely. Would recommend Palliative Care Consult and meeting with family for goals of care.   Rita H. Owens Shark, NP-C 08/11/2020, 9:45 AM  [place]

## 2020-08-11 NOTE — Progress Notes (Signed)
[place] for IV Heparin  Indication: chest pain/ACS  Allergies  Allergen Reactions  . Contrast Media [Iodinated Diagnostic Agents] Swelling    SWELLING REACTION UNSPECIFIED   . Tape Other (See Comments) and Dermatitis    Tears skin - please use paper tape Paper tape only    Vital Signs: Temp: 97.8 F (36.6 C) (03/16 1731) Temp Source: Oral (03/16 1731) BP: 148/58 (03/16 1731) Pulse Rate: 68 (03/16 1731)   Total Body Weight: 73.9 kg Height: 62 inches Heparin Dosing Weight: 64 kg  Labs: Recent Labs    08/10/20 2014 08/10/20 2329 08/11/20 0028 08/11/20 0129 08/11/20 1039 08/11/20 1423  HGB 11.5*  --  10.2*  --  10.4*  --   HCT 38.9  --  30.0*  --  34.3*  --   PLT 82*  --   --   --  75*  --   LABPROT  --  12.9  --   --   --   --   INR  --  1.0  --   --   --   --   HEPARINUNFRC  --   --   --   --  0.45 0.10*  CREATININE 5.91*  --   --   --   --   --   TROPONINIHS  --  237*  --  215*  --   --     Estimated Creatinine Clearance: 6.9 mL/min (A) (by C-G formula based on SCr of 5.91 mg/dL (H)).  Assessment: 80 yr old female with hx of CAD (S/P DES) and ESRD (on PD) presented to the ED with shortness of breath and neck/shoulder pain. Troponin is elevated, so pt was started on IV heparin at 700 units/hr. Initial heparin level was therapeutic at 0.45 units/ml. Of note, pt has history of thrombocytopenia, but platelets appear to be at her baseline (75); H/H 10.4/34.3; CrCl 6.9 ml/min.   Heparin was stopped prior to transfer to the floor from the ED, due to subcutaneous hematoma/bleeding at prior IV site. Per RN, this has improved; and pt has no active bleeding issues. Pharmacy was consulted to resume heparin for ACS. Will target low end of heparin level goal range, due to hematoma/bleeding earlier today.  Goal of Therapy:  Heparin level: 0.3-0.5 units/ml Monitor platelets by anticoagulation protocol: Yes   Plan:  Resume heparin infusion  at 700 units/hr Check heparin level in 8 hrs Monitor daily heparin level, CBC (monitor platelets closely) Monitor for bleeding  Gillermina Hu, PharmD, BCPS, [facility] Clinical Pharmacist 08/11/2020 5:38 PM

## 2020-08-11 NOTE — Progress Notes (Signed)
  Echocardiogram 2D Echocardiogram has been performed.  Samantha Conrad 08/11/2020, 3:08 PM

## 2020-08-11 NOTE — ED Notes (Signed)
Per PA [place], neuro checks do not have to be done every 13mn on patient.

## 2020-08-11 NOTE — ED Notes (Signed)
Attempted to call report, floor nurse to call back

## 2020-08-11 NOTE — Progress Notes (Signed)
Morning medication given. Sugar low at 66, pt ate some breakfast sugar up to 74. Velva Harman, NP at the bedside. Hold PO tradjenta at this time. Pt assisted up to [facility]. Pt has small soft BM. Pt was x1 assist for transfer. 1-2+ pitting edema from Hip to foot. Some periorbital edema. Lungs are diminished with crackles at lower bases. Pt is A&O x3 at this time. Not oriented to time/year. She is resting in bed at this time. Peri care was provided and purwick changed.

## 2020-08-11 NOTE — ED Notes (Signed)
Purewick placed at this time.

## 2020-08-11 NOTE — ED Notes (Signed)
Bleeding at IV site, pressure held and continues to bleed. Pressure bandage applied, MD notified

## 2020-08-11 NOTE — ED Notes (Signed)
Spoke with [place] on [address] regarding pts need of a covid swab, she will deliver message to floor RN

## 2020-08-11 NOTE — ED Notes (Addendum)
PA in with pt

## 2020-08-11 NOTE — Progress Notes (Signed)
[place] for Heparin  Indication: chest pain/ACS  Allergies  Allergen Reactions  . Contrast Media [Iodinated Diagnostic Agents] Swelling    SWELLING REACTION UNSPECIFIED   . Tape Other (See Comments) and Dermatitis    Tears skin - please use paper tape Paper tape only    Vital Signs: Temp: 97.9 F (36.6 C) (03/16 0803) Temp Source: Oral (03/16 0803) BP: 130/54 (03/16 1000) Pulse Rate: 65 (03/16 1000)  Labs: Recent Labs    08/10/20 2014 08/10/20 2329 08/11/20 0028 08/11/20 0129 08/11/20 1039  HGB 11.5*  --  10.2*  --   --   HCT 38.9  --  30.0*  --   --   PLT 82*  --   --   --   --   LABPROT  --  12.9  --   --   --   INR  --  1.0  --   --   --   HEPARINUNFRC  --   --   --   --  0.45  CREATININE 5.91*  --   --   --   --   TROPONINIHS  --  237*  --  215*  --     CrCl cannot be calculated (Unknown ideal weight.).  Assessment: 80 y/o F presents to the ED with shortness of breath and neck/shoulder pain. Troponin is elevated so pt was started on IV heparin. Initial heparin level is therapeutic. Of note pt with history of thrombocytopenia but platelets appear to be at her baseline. No bleeding noted.    Goal of Therapy:  Heparin level 0.3-0.7 units/ml Monitor platelets by anticoagulation protocol: Yes   Plan:  Continue heparin gtt at 700 units/hr Repeat heparin level in 6 hours Daily CBC/Heparin level Monitor for bleeding Watch platelets  Salome Arnt, PharmD, BCPS Clinical Pharmacist Please see AMION for all pharmacy numbers 08/11/2020 11:29 AM

## 2020-08-11 NOTE — H&P (Signed)
History and Physical    Samantha Conrad DDU:[id number] DOB: 1940/07/21 DOA: 08/10/2020  PCP: [facility]   Patient coming from:  Home  Chief Complaint: SOB, fatigue and confusion this afternoon  HPI: Samantha Conrad is a 80 y.o. female with medical history significant for DMT2, HTN, CAD, ESRD on peritoneal dialysis who presents with complaint of fatigue, weakness over past few days with confusion today. Samantha Conrad reports that she felt poorly today but is unable to provide much information about exactly what was going on.  She states that her left shoulder and neck were hurting her and that was unrelieved with medication and a heating pad. She reports that she felt short of breath and was very sweaty when the SOB occurred. She did not have chest pain. She has CAD and has 2 stents. She is diabetic that is suboptimally controlled and recently had lantus added to her DM regimen. She is able to oriented to person place and day of week and able to answer questions but is a poor historian. She has not had LOC, drooping face or slurred speech.  She had seen her PCP on morning of 08/10/20 and had blood work done. The episode of SOB and confusion occurred after returning home. ER provider spoke with her son who reported that they were told 3-4 weeks ago that her dialysis is no longer as efficient as it has been. He reports she has been complaining of neck pain for several days.  He reports his sister called several hours later at lunch reporting that pt was confused with lots of pain in her neck and shoulder.  He reports talking to his mom this evening and she was very confused and very short of breath.  He reports some confusion at baseline, but this afternoon and tonight was significantly worse.  He also reports she has been coughing up blood for several days. They report several days of "low grade fevers to 99.1" Pt lives alone and her children check on her.  He reports no missed doses of  medication including torsemide.  He reports she has gained a significant amount of weight in the last few weeks. Pt took 1 tab of Tylenol #3 today for pain - son checked the pills and the count was correct.  ED Course: She is down to be hemodynamically stable.  Troponin was elevated at greater than 250.  Chest x-ray revealed a left lower lobe pneumonia.  She was started on antibiotic in the emergency room.  ER provider consulted cardiology  Review of Systems:  General: Reports weakness and fatigue. Confusion. Denies fever, chills, weight loss, night sweats.  Denies dizziness.  Denies change in appetite HENT: Denies head trauma, headache, denies change in hearing, tinnitus.  Denies nasal congestion or bleeding.  Denies sore throat, sores in mouth.  Denies difficulty swallowing Eyes: Denies blurry vision, pain in eye, drainage.  Denies discoloration of eyes. Neck: Denies pain.  Denies swelling.  Denies pain with movement. Cardiovascular: Denies chest pain, palpitations.  Has chronic edema.  Denies orthopnea Respiratory: Reports shortness of breath. Denies cough.  Denies wheezing.  Denies sputum production Gastrointestinal: Denies abdominal pain, swelling.  Denies nausea, vomiting, diarrhea.  Denies melena.  Denies hematemesis. Musculoskeletal: Denies limitation of movement. Reports swelling legs chronically. Denies pain. Denies arthralgias or myalgias. Genitourinary: Denies pelvic pain.  Denies urinary frequency or hesitancy.  Denies dysuria.  Skin: Denies rash.  Denies petechiae, purpura, ecchymosis. Neurological: Denies headache. Denies syncope. Denies seizure activity  Denies paresthesia. Denies slurred speech, drooping face.  Denies visual change. Psychiatric: Denies depression, anxiety. Denies hallucinations.  Past Medical History:  Diagnosis Date  . Anemia   . Basal cell carcinoma     on nose/face  . CAD (coronary artery disease)    a. 08/2018: posterior STEMI s/p DES to LCx x2  .  Depression   . ESRD on peritoneal dialysis ([place])    "q night" (11/29/2017)  . Fatty liver   . Hypertension   . Hypothyroidism   . NASH (nonalcoholic steatohepatitis)   . Seizures ([place])    ? when she may have had a stroke sometime in the spring of 2018  . Sleep apnea    diagnosed years ago "after I'd gained alot of weight;  no longer a problem since weight loss"  . Stroke [facility]) 04/2016   memory issues, lost 1/2 vision in both eyes  . Thrombocytopenia ([place])   . Thyroid disease   . Type II diabetes mellitus ([place])     Past Surgical History:  Procedure Laterality Date  . ABDOMINAL HYSTERECTOMY    . AV FISTULA PLACEMENT Right 10/30/2016   Procedure: INSERTION OF ARTERIOVENOUS (AV) GORE-TEX GRAFT RIGHT UPPER ARM;  Surgeon: Elam Dutch, MD;  Location: [place];  Service: Vascular;  Laterality: Right;  . CATARACT EXTRACTION W/ INTRAOCULAR LENS  IMPLANT, BILATERAL Bilateral   . CHOLECYSTECTOMY OPEN    . CORNEAL TRANSPLANT Bilateral   . CORONARY ANGIOGRAPHY N/A 09/06/2018   Procedure: CORONARY ANGIOGRAPHY;  Surgeon: Lorretta Harp, MD;  Location: [place] CV [facility];  Service: Cardiovascular;  Laterality: N/A;  . CORONARY/GRAFT ACUTE MI REVASCULARIZATION N/A 09/06/2018   Procedure: Coronary/Graft Acute MI Revascularization;  Surgeon: Lorretta Harp, MD;  Location: [place] [facility];  Service: Cardiovascular;  Laterality: N/A;  . EYE SURGERY    . KNEE ARTHROSCOPY Left   . LEFT HEART CATH AND CORONARY ANGIOGRAPHY N/A 09/06/2018   Procedure: LEFT HEART CATH AND CORONARY ANGIOGRAPHY;  Surgeon: Lorretta Harp, MD;  Location: [place] [facility];  Service: Cardiovascular;  Laterality: N/A;  . MOHS SURGERY     "nose"  . SHOULDER OPEN ROTATOR CUFF REPAIR Left   . THROMBECTOMY AND REVISION OF ARTERIOVENTOUS (AV) GORETEX  GRAFT Right 12/04/2016   Procedure: THROMBECTOMY/ REVISION OF RIGHT UPPER ARM ARTERIOVENOUS GORETEX GRAFT;  Surgeon: Elam Dutch, MD;  Location: [place];  Service: Vascular;   Laterality: Right;  . TONSILLECTOMY      Social History  reports that she has never smoked. She has quit using smokeless tobacco.  Her smokeless tobacco use included snuff. She reports that she does not drink alcohol and does not use drugs.  Allergies  Allergen Reactions  . Contrast Media [Iodinated Diagnostic Agents] Swelling    SWELLING REACTION UNSPECIFIED   . Tape Other (See Comments) and Dermatitis    Tears skin - please use paper tape Paper tape only    Family History  Problem Relation Age of Onset  . Tuberculosis Mother      Prior to Admission medications   Medication Sig Start Date End Date Taking? Authorizing Provider  acetaminophen-codeine (TYLENOL #3) 300-30 MG tablet Take 1-2 tablets by mouth every 6 (six) hours as needed for moderate pain.    [provider]  Amino Acids-Protein Hydrolys (FEEDING SUPPLEMENT, PRO-STAT SUGAR FREE 64,) LIQD Take 30 mLs by mouth 2 (two) times daily. 03/16/17   Patrecia Pour, Christean Grief, MD  amLODipine (NORVASC) 5 MG tablet Take 1 tablet (  5 mg total) by mouth daily. Patient taking differently: Take 5 mg by mouth at bedtime. 07/31/19 03/20/20  Troy Sine, MD  aspirin 81 MG chewable tablet Chew 1 tablet (81 mg total) by mouth daily. 09/08/18   Eileen Stanford, PA-C  atorvastatin (LIPITOR) 40 MG tablet TAKE 1 TABLET (40 MG TOTAL) BY MOUTH DAILY AT 6 PM. 01/13/20 03/20/20  Troy Sine, MD  carvedilol (COREG) 3.125 MG tablet Take 1 tablet (3.125 mg total) by mouth 2 (two) times daily with a meal. 08/02/20 09/01/20  Troy Sine, MD  citalopram (CELEXA) 10 MG tablet Take 10 mg by mouth daily with breakfast.  10/15/17   [provider]  clopidogrel (PLAVIX) 75 MG tablet TAKE 1 TABLET (75 MG TOTAL) BY MOUTH DAILY WITH BREAKFAST. 11/04/19   Troy Sine, MD  docusate sodium (COLACE) 100 MG capsule Take 100 mg by mouth daily with lunch.    [provider]  gentamicin cream (GARAMYCIN) 0.1 % Apply 1 application topically  daily as needed. 03/02/20   [provider]  glipiZIDE (GLUCOTROL) 5 MG tablet Take 5 mg by mouth 2 (two) times daily before a meal.  06/25/18   [provider]  lactulose, encephalopathy, (CHRONULAC) 10 GM/15ML SOLN Take 30 g by mouth once.    [provider]  LANTUS SOLOSTAR 100 UNIT/ML Solostar Pen Inject 12 Units into the skin at bedtime. 05/10/20   [provider]  levETIRAcetam (KEPPRA) 250 MG tablet Take 250 mg by mouth 2 (two) times daily with a meal.     [provider]  levothyroxine (SYNTHROID, LEVOTHROID) 100 MCG tablet Take 100 mcg by mouth daily before breakfast.  09/02/16   [provider]  linagliptin (TRADJENTA) 5 MG TABS tablet Take 5 mg by mouth daily with breakfast.     [provider]  lipase/protease/amylase (CREON) 12000-38000 units CPEP capsule Take 48,000 Units by mouth 3 (three) times daily with meals.    [provider]  Methoxy PEG-Epoetin Beta (MIRCERA IJ) Inject into the skin. 03/09/20   [provider]  multivitamin (PROSIGHT) TABS tablet Take 1 tablet by mouth daily. Patient taking differently: Take 1 tablet by mouth daily with breakfast. 03/17/17   Patrecia Pour, Christean Grief, MD  nitroGLYCERIN (NITROSTAT) 0.4 MG SL tablet Place 0.4 mg under the tongue every 5 (five) minutes as needed for chest pain.     [provider]  Nutritional Supplements (FEEDING SUPPLEMENT, NEPRO CARB STEADY,) LIQD Take 237 mLs by mouth daily. Patient taking differently: Take 237 mLs by mouth daily with lunch. 03/17/17   Patrecia Pour, Christean Grief, MD  pantoprazole (PROTONIX) 40 MG tablet Take 40 mg by mouth daily with breakfast.  08/21/18   [provider]  pioglitazone (ACTOS) 15 MG tablet Take 15 mg by mouth daily. 07/10/20   [provider]  polyethylene glycol (MIRALAX / GLYCOLAX) packet Take 17 g by mouth daily.    [provider]  potassium chloride SA (KLOR-CON) 20 MEQ tablet Take 20 mEq by  mouth daily.    [provider]  prednisoLONE acetate (PRED FORTE) 1 % ophthalmic suspension Place 1 drop into both eyes at bedtime.     [provider]  sevelamer carbonate (RENVELA) 800 MG tablet Take 800 mg by mouth 3 (three) times daily with meals.  07/04/18   [provider]  torsemide (DEMADEX) 100 MG tablet Take 100 mg by mouth daily. 03/17/20   [provider]  traZODone (DESYREL) 50 MG  tablet Take 50 mg by mouth at bedtime.    [provider]    Physical Exam: Vitals:   08/11/20 0130 08/11/20 0215 08/11/20 0245 08/11/20 0300  BP: (!) 142/64 (!) 126/59 (!) 139/59 (!) 132/58  Pulse: 70 71 70 67  Resp: 20 11 11 11   Temp:      SpO2: 97% 97% 95% (S) (!) 88%    Constitutional: NAD, calm, comfortable Vitals:   08/11/20 0130 08/11/20 0215 08/11/20 0245 08/11/20 0300  BP: (!) 142/64 (!) 126/59 (!) 139/59 (!) 132/58  Pulse: 70 71 70 67  Resp: 20 11 11 11   Temp:      SpO2: 97% 97% 95% (S) (!) 88%   General: WDWN, Alert and oriented x3.  Eyes: EOMI, PERRL, conjunctivae normal.  Sclera nonicteric HENT:  [place]/AT, external ears normal.  Nares patent without epistasis.  Mucous membranes are moist. Neck: Soft, normal range of motion, supple, no masses, no thyromegaly. Trachea midline Respiratory: Breath sounds in bases bilaterally.  Rhonchi in left lung base.  No wheezing, no crackles. Normal respiratory effort. No accessory muscle use.  Cardiovascular: Regular rate and rhythm, no murmurs / rubs / gallops. Has lower extremity edema. 2+ pedal pulses.  Abdomen: Soft, no tenderness, nondistended, no rebound or guarding.  No masses palpated. Bowel sounds normoactive Musculoskeletal: FROM. no cyanosis. No joint deformity upper and lower extremities. Normal muscle tone.  Skin: Warm, dry, intact no rashes, lesions, ulcers. No induration Neurologic: CN 2-12 grossly intact.  Normal speech.  Sensation intact. Strength 5/5 in all extremities.   Psychiatric:  Normal judgment and insight.  Normal mood.    Labs on Admission: I have personally reviewed following labs and imaging studies  CBC: Recent Labs  Lab 08/10/20 2014 08/11/20 0028  WBC 4.1  --   HGB 11.5* 10.2*  HCT 38.9 30.0*  MCV 93.1  --   PLT 82*  --     Basic Metabolic Panel: Recent Labs  Lab 08/10/20 2014 08/11/20 0028  NA 137 134*  K 4.2 3.7  CL 98  --   CO2 24  --   GLUCOSE 88  --   BUN 53*  --   CREATININE 5.91*  --   CALCIUM 9.5  --     GFR: CrCl cannot be calculated (Unknown ideal weight.).  Liver Function Tests: Recent Labs  Lab 08/10/20 2014  AST 22  ALT 22  ALKPHOS 127*  BILITOT 0.8  PROT 6.3*  ALBUMIN 2.9*    Urine analysis:    Component Value Date/Time   COLORURINE STRAW (A) 08/10/2020 2329   APPEARANCEUR CLEAR 08/10/2020 2329   LABSPEC 1.006 08/10/2020 2329   PHURINE 8.0 08/10/2020 2329   GLUCOSEU 150 (A) 08/10/2020 2329   HGBUR SMALL (A) 08/10/2020 2329   BILIRUBINUR NEGATIVE 08/10/2020 2329   KETONESUR NEGATIVE 08/10/2020 2329   PROTEINUR 30 (A) 08/10/2020 2329   NITRITE NEGATIVE 08/10/2020 2329   LEUKOCYTESUR NEGATIVE 08/10/2020 2329    Radiological Exams on Admission: CT Head Wo Contrast  Result Date: 08/10/2020 CLINICAL DATA:  Mental status change.  Unknown cause. EXAM: CT HEAD WITHOUT CONTRAST TECHNIQUE: Contiguous axial images were obtained from the base of the skull through the vertex without intravenous contrast. COMPARISON:  CT head 05/04/2017 FINDINGS: Brain: Cerebral ventricle sizes are concordant with the degree of cerebral volume loss. Patchy and confluent areas of decreased attenuation are noted throughout the deep and periventricular white matter of the cerebral hemispheres bilaterally, compatible with chronic microvascular ischemic disease.  Prior left occipital infarction. No evidence of large-territorial acute infarction. No parenchymal hemorrhage. No mass lesion. No extra-axial collection. No mass effect or midline  shift. No hydrocephalus. Basilar cisterns are patent. Vascular: No hyperdense vessel. Atherosclerotic calcifications are present within the cavernous internal carotid arteries. Skull: No acute fracture or focal lesion. Sinuses/Orbits: Paranasal sinuses and mastoid air cells are clear. Bilateral lens replacement. Otherwise the orbits are unremarkable. Other: None. IMPRESSION: No acute intracranial abnormality. Electronically Signed   By: Iven Finn M.D.   On: 08/10/2020 23:15   DG Chest Port 1 View  Result Date: 08/10/2020 CLINICAL DATA:  Shortness of breath. EXAM: PORTABLE CHEST 1 VIEW COMPARISON:  03/19/2020 FINDINGS: Mild cardiomegaly. Aortic atherosclerosis. Streaky retrocardiac left lung base opacities. Suspected small pleural effusions. No pulmonary edema or pneumothorax. No acute osseous abnormalities are seen. IMPRESSION: 1. Streaky retrocardiac left lung base opacities, atelectasis versus pneumonia. 2. Suspected small pleural effusions. Electronically Signed   By: Keith Rake M.D.   On: 08/10/2020 23:06   DG Abdomen Acute W/Chest  Result Date: 08/11/2020 CLINICAL DATA:  Shortness of breath and altered mental status. EXAM: DG ABDOMEN ACUTE WITH 1 VIEW CHEST COMPARISON:  July 13, 2020 FINDINGS: There is no evidence of dilated bowel loops or free intraperitoneal air. A peritoneal dialysis catheter is seen and unchanged in position. No radiopaque calculi are seen. Stable 1.5 cm and 0.4 cm soft tissue calcifications are seen adjacent to the L2 vertebral body on the right and correspond to calcified lymph nodes noted within this on prior imaging. Radiopaque surgical clips are seen overlying the right upper quadrant. Heart size and mediastinal contours are within normal limits. Very mild atelectasis is seen within the left lung base. There is a small left pleural effusion. IMPRESSION: 1. Nonobstructive bowel gas pattern. 2. Mild left basilar atelectasis and small left pleural effusion.  Electronically Signed   By: Virgina Norfolk M.D.   On: 08/11/2020 00:00    EKG: Independently reviewed.  EKG shows normal sinus rhythm with first-degree AV block.  PVCs noted.  QTc 473  Assessment/Plan Principal Problem:   NSTEMI (non-ST elevated myocardial infarction) Ms. Linard Millers is admitted to cardiac telemetry NSTEMI.  Obtain serial troponin levels. Pt is started on heparin infusion in the ER. Cardiology consulted by the ER provider. Antiplatelet therapy with aspirin daily.  Check lipid panel.  Continue statin therapy.  Monitor blood pressure.  Nitroglycerin as needed.  Pulmonal oxygen as needed to maintain O2 sat between 92-96% Obtain Echo in am.   Active Problems:   CAP (community acquired pneumonia) Patient has left base pneumonia on chest x-ray.  Was given Rocephin and azithromycin in the emergency room.  We will continue with Rocephin.  Will use doxycycline 100 mg twice daily as patient does have a mildly prolonged QT intervals so would recommend not using azithromycin could further prolong the QT intervals    Essential hypertension [place].  Monitor blood pressure    ESRD on dialysis  Patient with end-stage renal disease on peritoneal dialysis every night.  Consult nephrology in the morning to assist with peritoneal dialysis while hospitalized    DM (diabetes mellitus), type 2 with renal complications  Continue basal insulin with Lantus.  Continue Tradjenta.  Monitor blood sugars with meals and at bedtime.  Sliding scale insulin provide as needed for glycemic control. Check hemoglobin A1c Heart healthy low-carb diet ordered    CAD S/P percutaneous coronary angioplasty History of CAD with stents placed a few years ago.  Plavix  held while on heparin infusion    DVT prophylaxis: Patient is placed on heparin infusion with non-STEMI Code Status:   Full code Family Communication:  Diagnosis and plan discussed with patient.  Questions answered.  Further recommendations to  follow as clinically indicated Disposition Plan:   Patient is from:  Home  Anticipated DC to:  Home  Anticipated DC date:  Anticipate at least 2 midnight stay in the hospital to treat acute condition  Anticipated DC barriers: No barriers to discharge identified at this time  Consults called:  Cardiology was consulted and called by the ER provider Admission status:  Inpatient   Yevonne Aline Ahley Bulls MD [facility]  How to contact the [facility] Attending or Consulting provider [place] or covering provider during after hours [place], for this patient?   1. Check the care team in [facility] and look for a) attending/consulting [facility] provider listed and b) the [facility] team listed 2. Log into [email or web address] and use [place]'s universal password to access. If you do not have the password, please contact the hospital operator. 3. Locate the [facility] provider you are looking for under [facility] and page to a number that you can be directly reached. 4. If you still have difficulty reaching the provider, please page the [facility] (Director on Call) for the Hospitalists listed on amion for assistance.  08/11/2020, 3:38 AM

## 2020-08-11 NOTE — Progress Notes (Signed)
ANTICOAGULATION CONSULT NOTE - Initial Consult  Pharmacy Consult for Heparin  Indication: chest pain/ACS  Allergies  Allergen Reactions   Contrast Media [Iodinated Diagnostic Agents] Swelling    SWELLING REACTION UNSPECIFIED    Tape Other (See Comments) and Dermatitis    Tears skin - please use paper tape Paper tape only    Vital Signs: Temp: 97.5 F (36.4 C) (03/15 1957) BP: 126/59 (03/16 0215) Pulse Rate: 71 (03/16 0215)  Labs: Recent Labs    08/10/20 2014 08/10/20 2329 08/11/20 0028  HGB 11.5*  --  10.2*  HCT 38.9  --  30.0*  PLT 82*  --   --   LABPROT  --  12.9  --   INR  --  1.0  --   CREATININE 5.91*  --   --   TROPONINIHS  --  237*  --     CrCl cannot be calculated (Unknown ideal weight.).   Medical History: Past Medical History:  Diagnosis Date   Anemia    Basal cell carcinoma     on nose/face   CAD (coronary artery disease)    a. 08/2018: posterior STEMI s/p DES to LCx x2   Depression    ESRD on peritoneal dialysis ([place])    "q night" (11/29/2017)   Fatty liver    Hypertension    Hypothyroidism    NASH (nonalcoholic steatohepatitis)    Seizures ([place])    ? when she may have had a stroke sometime in the spring of 2018   Sleep apnea    diagnosed years ago "after I'd gained alot of weight;  no longer a problem since weight loss"   Stroke [facility]) 04/2016   memory issues, lost 1/2 vision in both eyes   Thrombocytopenia ([place])    Thyroid disease    Type II diabetes mellitus ([place])     Assessment: 80 y/o F presents to the ED with shortness of breath and neck/shoulder pain. Troponin is elevated. Starting heparin. Pt has ESRD on peritoneal dialysis at home. Hgb is 10.2. Plts on the low side at 82.   Goal of Therapy:  Heparin level 0.3-0.7 units/ml Monitor platelets by anticoagulation protocol: Yes   Plan:  Heparin 2500 units BOLUS Start heparin drip at 700 units/hr 1000 Heparin level Daily CBC/Heparin level Monitor for  bleeding  Narda Bonds, PharmD, BCPS Clinical Pharmacist Phone: [phone]

## 2020-08-12 ENCOUNTER — Other Ambulatory Visit: Payer: Self-pay

## 2020-08-12 DIAGNOSIS — Z515 Encounter for palliative care: Secondary | ICD-10-CM | POA: Diagnosis not present

## 2020-08-12 DIAGNOSIS — Z7189 Other specified counseling: Secondary | ICD-10-CM | POA: Diagnosis not present

## 2020-08-12 DIAGNOSIS — N186 End stage renal disease: Secondary | ICD-10-CM | POA: Diagnosis not present

## 2020-08-12 DIAGNOSIS — Z992 Dependence on renal dialysis: Secondary | ICD-10-CM

## 2020-08-12 DIAGNOSIS — R0602 Shortness of breath: Secondary | ICD-10-CM

## 2020-08-12 DIAGNOSIS — I214 Non-ST elevation (NSTEMI) myocardial infarction: Secondary | ICD-10-CM | POA: Diagnosis not present

## 2020-08-12 DIAGNOSIS — J189 Pneumonia, unspecified organism: Secondary | ICD-10-CM

## 2020-08-12 LAB — CBC
HCT: 35.7 % — ABNORMAL LOW (ref 36.0–46.0)
Hemoglobin: 10.8 g/dL — ABNORMAL LOW (ref 12.0–15.0)
MCH: 27.7 pg (ref 26.0–34.0)
MCHC: 30.3 g/dL (ref 30.0–36.0)
MCV: 91.5 fL (ref 80.0–100.0)
Platelets: 75 10*3/uL — ABNORMAL LOW (ref 150–400)
RBC: 3.9 MIL/uL (ref 3.87–5.11)
RDW: 16.2 % — ABNORMAL HIGH (ref 11.5–15.5)
WBC: 4.1 10*3/uL (ref 4.0–10.5)
nRBC: 0 % (ref 0.0–0.2)

## 2020-08-12 LAB — GLUCOSE, CAPILLARY
Glucose-Capillary: 378 mg/dL — ABNORMAL HIGH (ref 70–99)
Glucose-Capillary: 84 mg/dL (ref 70–99)
Glucose-Capillary: 131 mg/dL — ABNORMAL HIGH (ref 70–99)
Glucose-Capillary: 314 mg/dL — ABNORMAL HIGH (ref 70–99)

## 2020-08-12 LAB — RENAL FUNCTION PANEL
Albumin: 2.5 g/dL — ABNORMAL LOW (ref 3.5–5.0)
Anion gap: 12 (ref 5–15)
BUN: 53 mg/dL — ABNORMAL HIGH (ref 8–23)
CO2: 25 mmol/L (ref 22–32)
Calcium: 8.7 mg/dL — ABNORMAL LOW (ref 8.9–10.3)
Chloride: 98 mmol/L (ref 98–111)
Creatinine, Ser: 6.14 mg/dL — ABNORMAL HIGH (ref 0.44–1.00)
GFR, Estimated: 6 mL/min — ABNORMAL LOW (ref >60)
Glucose, Bld: 176 mg/dL — ABNORMAL HIGH (ref 70–99)
Phosphorus: 3.9 mg/dL (ref 2.5–4.6)
Potassium: 3.5 mmol/L (ref 3.5–5.1)
Sodium: 135 mmol/L (ref 135–145)

## 2020-08-12 LAB — HEPARIN LEVEL (UNFRACTIONATED): Heparin Unfractionated: 0.17 IU/mL — ABNORMAL LOW (ref 0.30–0.70)

## 2020-08-12 LAB — PROCALCITONIN: Procalcitonin: 0.12 ng/mL

## 2020-08-12 MED ORDER — CLOPIDOGREL BISULFATE 75 MG PO TABS
75.0000 mg | ORAL_TABLET | Freq: Every day | ORAL | Status: DC
  Administered 2020-08-12 – 2020-08-13 (×2): 75 mg via ORAL
  Filled 2020-08-12 (×2): qty 1

## 2020-08-12 MED ORDER — INSULIN ASPART 100 UNIT/ML ~~LOC~~ SOLN
0.0000 [IU] | Freq: Three times a day (TID) | SUBCUTANEOUS | Status: DC
  Administered 2020-08-12: 1 [IU] via SUBCUTANEOUS
  Administered 2020-08-12: 9 [IU] via SUBCUTANEOUS
  Administered 2020-08-13: 5 [IU] via SUBCUTANEOUS

## 2020-08-12 MED ORDER — LEVOTHYROXINE SODIUM 100 MCG PO TABS
100.0000 ug | ORAL_TABLET | Freq: Every day | ORAL | Status: DC
  Administered 2020-08-12 – 2020-08-13 (×2): 100 ug via ORAL
  Filled 2020-08-12 (×2): qty 1

## 2020-08-12 NOTE — Progress Notes (Signed)
[facility] ROUNDING NOTE   Subjective:   Brief history: This is an 80 year old lady who undergoes CCPD as per kidney center she has a history of diabetes hypertension CAD CVA mild dementia at baseline and NASH.  She was brought to the emergency room 08/10/2020 with increasing shortness of breath and anasarca.  We are attempting ultrafiltration with 4.25% bags.  Patient is refusing to transition to intermittent hemodialysis.  Blood pressure 148/59 pulse 6 7 temperature 98.6 O2 sats 98%  Hemoglobin 10.8 sodium 134 potassium 3.7 chloride 98 CO2 24 BUN 53 creatinine 5.91 glucose 88 albumin 2.9  Objective:  Vital signs in last 24 hours:  Temp:  [97.8 F (36.6 C)-98.6 F (37 C)] 98.6 F (37 C) (03/17 0452) Pulse Rate:  [63-71] 67 (03/17 0452) Resp:  [11-20] 20 (03/17 0452) BP: (130-150)/(50-65) 148/59 (03/17 0452) SpO2:  [93 %-100 %] 97 % (03/17 0452) Weight:  [73.9 kg-74.3 kg] 74.3 kg (03/16 2037)  Weight change:  Filed Weights   08/11/20 1731 08/11/20 2037  Weight: 73.9 kg 74.3 kg    Intake/Output: I/O last 3 completed shifts: In: 50 [P.O.:50] Out: 150 [Urine:150]   Intake/Output this shift:  No intake/output data recorded.  General: Pleasant elderly female in no acute distress. Head: Normocephalic, atraumatic, sclera non-icteric, mucus membranes are moist Neck: Supple. JVD not elevated. Lungs: Bilateral breath sounds decreased in bases with few bibasilar crackles. No WOB.  Heart: RRR with S1 S2. No murmurs, rubs, or gallops appreciated.SR on monitor rare PVCs.  Abdomen: Soft, non-tender, non-distended with normoactive bowel sounds. No rebound/guarding. No obvious abdominal masses. PD catheter drsg intact.  M-S:  Strength and tone appear normal for age. Lower extremities: 1-2+ edema starting lower abdomen, hips, thighs, pretibial bilaterally.   Neuro: Alert and oriented X 3. Moves all extremities spontaneously. Psych:  Responds to questions appropriately with  a normal affect. Dialysis Access:PD catheter intact.    Basic Metabolic Panel: Recent Labs  Lab 08/10/20 2014 08/11/20 0028  NA 137 134*  K 4.2 3.7  CL 98  --   CO2 24  --   GLUCOSE 88  --   BUN 53*  --   CREATININE 5.91*  --   CALCIUM 9.5  --     Liver Function Tests: Recent Labs  Lab 08/10/20 2014  AST 22  ALT 22  ALKPHOS 127*  BILITOT 0.8  PROT 6.3*  ALBUMIN 2.9*   Recent Labs  Lab 08/10/20 2329  LIPASE 18   Recent Labs  Lab 08/10/20 2329  AMMONIA 22    CBC: Recent Labs  Lab 08/10/20 2014 08/11/20 0028 08/11/20 1039 08/12/20 0208  WBC 4.1  --  4.7 4.1  HGB 11.5* 10.2* 10.4* 10.8*  HCT 38.9 30.0* 34.3* 35.7*  MCV 93.1  --  91.7 91.5  PLT 82*  --  75* 75*    Cardiac Enzymes: No results for input(s): CKTOTAL, CKMB, CKMBINDEX, TROPONINI in the last 168 hours.  BNP: Invalid input(s): POCBNP  CBG: Recent Labs  Lab 08/11/20 1150 08/11/20 1638 08/11/20 1741 08/11/20 2250 08/12/20 0634  GLUCAP 119* 134* 149* 277* 378*    Microbiology: Results for orders placed or performed during the hospital encounter of 08/10/20  SARS CORONAVIRUS 2 (TAT 6-24 HRS) Nasopharyngeal Nasopharyngeal Swab     Status: None   Collection Time: 08/11/20  5:35 PM   Specimen: Nasopharyngeal Swab  Result Value Ref Range Status   SARS Coronavirus 2 NEGATIVE NEGATIVE Final    Comment: (NOTE) SARS-CoV-2 target  nucleic acids are NOT DETECTED.  The SARS-CoV-2 RNA is generally detectable in upper and lower respiratory specimens during the acute phase of infection. Negative results do not preclude SARS-CoV-2 infection, do not rule out co-infections with other pathogens, and should not be used as the sole basis for treatment or other patient management decisions. Negative results must be combined with clinical observations, patient history, and epidemiological information. The expected result is Negative.  Fact Sheet for  Patients: SugarRoll.be  Fact Sheet for Healthcare Providers: https://www.woods-mathews.com/  This test is not yet approved or cleared by the [place] FDA and  has been authorized for detection and/or diagnosis of SARS-CoV-2 by FDA under an Emergency Use Authorization (EUA). This EUA will remain  in effect (meaning this test can be used) for the duration of the COVID-19 declaration under Se ction 564(b)(1) of the Act, 21 U.S.C. section 360bbb-3(b)(1), unless the authorization is terminated or revoked sooner.  Performed at [facility], [place] [address] [place], [place] [address]     Coagulation Studies: Recent Labs    08/10/20 2329  LABPROT 12.9  INR 1.0    Urinalysis: Recent Labs    08/10/20 2329  COLORURINE STRAW*  LABSPEC 1.006  PHURINE 8.0  GLUCOSEU 150*  HGBUR SMALL*  BILIRUBINUR NEGATIVE  KETONESUR NEGATIVE  PROTEINUR 30*  NITRITE NEGATIVE  LEUKOCYTESUR NEGATIVE      Imaging: CT Head Wo Contrast  Result Date: 08/10/2020 CLINICAL DATA:  Mental status change.  Unknown cause. EXAM: CT HEAD WITHOUT CONTRAST TECHNIQUE: Contiguous axial images were obtained from the base of the skull through the vertex without intravenous contrast. COMPARISON:  CT head 05/04/2017 FINDINGS: Brain: Cerebral ventricle sizes are concordant with the degree of cerebral volume loss. Patchy and confluent areas of decreased attenuation are noted throughout the deep and periventricular white matter of the cerebral hemispheres bilaterally, compatible with chronic microvascular ischemic disease. Prior left occipital infarction. No evidence of large-territorial acute infarction. No parenchymal hemorrhage. No mass lesion. No extra-axial collection. No mass effect or midline shift. No hydrocephalus. Basilar cisterns are patent. Vascular: No hyperdense vessel. Atherosclerotic calcifications are present within the cavernous internal carotid arteries.  Skull: No acute fracture or focal lesion. Sinuses/Orbits: Paranasal sinuses and mastoid air cells are clear. Bilateral lens replacement. Otherwise the orbits are unremarkable. Other: None. IMPRESSION: No acute intracranial abnormality. Electronically Signed   By: Iven Finn M.D.   On: 08/10/2020 23:15   DG Chest Port 1 View  Result Date: 08/10/2020 CLINICAL DATA:  Shortness of breath. EXAM: PORTABLE CHEST 1 VIEW COMPARISON:  03/19/2020 FINDINGS: Mild cardiomegaly. Aortic atherosclerosis. Streaky retrocardiac left lung base opacities. Suspected small pleural effusions. No pulmonary edema or pneumothorax. No acute osseous abnormalities are seen. IMPRESSION: 1. Streaky retrocardiac left lung base opacities, atelectasis versus pneumonia. 2. Suspected small pleural effusions. Electronically Signed   By: Keith Rake M.D.   On: 08/10/2020 23:06   DG Abdomen Acute W/Chest  Result Date: 08/11/2020 CLINICAL DATA:  Shortness of breath and altered mental status. EXAM: DG ABDOMEN ACUTE WITH 1 VIEW CHEST COMPARISON:  July 13, 2020 FINDINGS: There is no evidence of dilated bowel loops or free intraperitoneal air. A peritoneal dialysis catheter is seen and unchanged in position. No radiopaque calculi are seen. Stable 1.5 cm and 0.4 cm soft tissue calcifications are seen adjacent to the L2 vertebral body on the right and correspond to calcified lymph nodes noted within this on prior imaging. Radiopaque surgical clips are seen overlying the right upper quadrant.  Heart size and mediastinal contours are within normal limits. Very mild atelectasis is seen within the left lung base. There is a small left pleural effusion. IMPRESSION: 1. Nonobstructive bowel gas pattern. 2. Mild left basilar atelectasis and small left pleural effusion. Electronically Signed   By: Virgina Norfolk M.D.   On: 08/11/2020 00:00   ECHOCARDIOGRAM COMPLETE  Result Date: 08/11/2020    ECHOCARDIOGRAM REPORT   Patient Name:   Samantha Conrad Date of Exam: 08/11/2020 Medical Rec #:  [id number]        Height:       61.5 in Accession #:    [id number]       Weight:       163.6 lb Date of Birth:  08/11/1940       BSA:          1.745 m Patient Age:    51 years         BP:           130/54 mmHg Patient Gender: F                HR:           65 bpm. Exam Location:  Inpatient Procedure: 2D Echo, Cardiac Doppler and Color Doppler Indications:    NSTEMI  History:        Patient has prior history of Echocardiogram examinations, most                 recent 09/06/2018. Previous Myocardial Infarction and CAD,                 Stroke; Risk Factors:Diabetes. ESRD, cirrhosis.  Sonographer:    Dustin Flock Referring Phys: [id number] [place]  1. Left ventricular ejection fraction, by estimation, is 60 to 65%. The left ventricle has normal function. The left ventricle has no regional wall motion abnormalities. Left ventricular diastolic parameters are indeterminate.  2. Right ventricular systolic function is normal. The right ventricular size is normal. There is normal pulmonary artery systolic pressure. The estimated right ventricular systolic pressure is 52.0 mmHg.  3. A small pericardial effusion is present. The pericardial effusion is circumferential.  4. The mitral valve is grossly normal. Mild mitral valve regurgitation. No evidence of mitral stenosis.  5. The aortic valve is tricuspid. There is mild calcification of the aortic valve. There is mild thickening of the aortic valve. Aortic valve regurgitation is not visualized. No aortic stenosis is present.  6. The inferior vena cava is normal in size with greater than 50% respiratory variability, suggesting right atrial pressure of 3 mmHg. FINDINGS  Left Ventricle: Left ventricular ejection fraction, by estimation, is 60 to 65%. The left ventricle has normal function. The left ventricle has no regional wall motion abnormalities. The left ventricular internal cavity size was normal in  size. There is  no left ventricular hypertrophy. Left ventricular diastolic parameters are indeterminate. Right Ventricle: The right ventricular size is normal. No increase in right ventricular wall thickness. Right ventricular systolic function is normal. There is normal pulmonary artery systolic pressure. The tricuspid regurgitant velocity is 2.80 m/s, and  with an assumed right atrial pressure of 3 mmHg, the estimated right ventricular systolic pressure is 80.2 mmHg. Left Atrium: Left atrial size was normal in size. Right Atrium: Right atrial size was normal in size. Pericardium: A small pericardial effusion is present. The pericardial effusion is circumferential. Mitral Valve: The mitral valve is grossly normal. There is mild calcification of  the anterior and posterior mitral valve leaflet(s). Mild mitral valve regurgitation. No evidence of mitral valve stenosis. Tricuspid Valve: The tricuspid valve is grossly normal. Tricuspid valve regurgitation is trivial. No evidence of tricuspid stenosis. Aortic Valve: The aortic valve is tricuspid. There is mild calcification of the aortic valve. There is mild thickening of the aortic valve. Aortic valve regurgitation is not visualized. No aortic stenosis is present. Pulmonic Valve: The pulmonic valve was grossly normal. Pulmonic valve regurgitation is not visualized. No evidence of pulmonic stenosis. Aorta: The aortic root and ascending aorta are structurally normal, with no evidence of dilitation. Venous: The inferior vena cava is normal in size with greater than 50% respiratory variability, suggesting right atrial pressure of 3 mmHg. IAS/Shunts: The atrial septum is grossly normal.  LEFT VENTRICLE PLAX 2D LVIDd:         4.70 cm  Diastology LVIDs:         3.70 cm  LV e' medial:    7.07 cm/s LV PW:         1.00 cm  LV E/e' medial:  16.1 LV IVS:        1.20 cm  LV e' lateral:   5.98 cm/s LVOT diam:     2.20 cm  LV E/e' lateral: 19.1 LV SV:         78 LV SV Index:   45 LVOT  Area:     3.80 cm  RIGHT VENTRICLE RV Basal diam:  3.00 cm RV S prime:     8.70 cm/s TAPSE (M-mode): 2.7 cm LEFT ATRIUM             Index       RIGHT ATRIUM           Index LA diam:        2.90 cm 1.66 cm/m  RA Area:     13.10 cm LA Vol (A2C):   36.4 ml 20.86 ml/m RA Volume:   32.80 ml  18.80 ml/m LA Vol (A4C):   27.9 ml 15.99 ml/m LA Biplane Vol: 34.0 ml 19.49 ml/m  AORTIC VALVE LVOT Vmax:   89.20 cm/s LVOT Vmean:  56.000 cm/s LVOT VTI:    0.205 m  AORTA Ao Root diam: 2.50 cm MITRAL VALVE                TRICUSPID VALVE MV Area (PHT): 5.38 cm     TR Peak grad:   31.4 mmHg MV Decel Time: 141 msec     TR Vmax:        280.00 cm/s MV E velocity: 114.00 cm/s MV A velocity: 112.00 cm/s  SHUNTS MV E/A ratio:  1.02         Systemic VTI:  0.20 m                             Systemic Diam: 2.20 cm Eleonore Chiquito MD Electronically signed by Eleonore Chiquito MD Signature Date/Time: 08/11/2020/4:45:30 PM    Final      Medications:   . sodium chloride    . cefTRIAXone (ROCEPHIN)  IV 1 g (08/11/20 2331)  . dialysis solution 4.25% low-MG/low-CA    . heparin 800 Units/hr (08/12/20 0349)   . aspirin  81 mg Oral Daily  . aspirin EC  81 mg Oral Daily  . atorvastatin  40 mg Oral q1800  . carvedilol  3.125 mg Oral BID WC  . doxycycline  100 mg Oral  Q12H  . gentamicin cream  1 application Topical Daily  . insulin aspart  0-5 Units Subcutaneous QHS  . insulin aspart  0-9 Units Subcutaneous TID WC  . insulin glargine  10 Units Subcutaneous QHS  . levothyroxine  100 mcg Oral QAC breakfast  . lipase/protease/amylase  48,000 Units Oral TID WC  . pantoprazole  40 mg Oral Q breakfast  . sevelamer carbonate  800 mg Oral TID WC  . sodium chloride flush  3 mL Intravenous Q12H  . traZODone  50 mg Oral QHS   sodium chloride, acetaminophen, dianeal solution for CAPD/CCPD with heparin, nitroGLYCERIN, sodium chloride flush  Assessment/ Plan:  Dialysis Orders: Center: [facility]. 7X Week, EDW 63.5 (kg) Ca 2.5  (mEq/L) Mg 0.5 (mEq/L), Dextrose 1.5; 2.5; 4.25 %, # Exchanges 5, fill vol 2250 mL cyc therapy vol 11250 mL cyc therapy time 10 hrs 0 min, Day Exchanges 0 -Heparin 6000 units IV 7 days a week   Assessment/Plan: 1. Volume overload/PD failure: Have been attempting as OP to lower volume using high concentration dianeal and torsemide. UF has been variable. Albumin 2.9 SCr 5.9 (probably underestimated). Will attempt to lower volume with all 4.25% dianeal solution tonight. Will not resume torsemide as this has been ineffective-makes very little urine. She refuses HD.  This may represent ultrafiltration failure.  Palliative medicine have been made aware 2.  NSTEMI-believe this is demand ischemia from volume excess. Cards consulted, Per Primary 3.  Possible CAP-does not appear toxic. WBC 4.1 afebrile. Started ABX per primary.  4.  ESRD -  CCPD X 7 days. Will continue tonight.  5.  Hypertension/volume  -Well controlled. Edematous from waist to ankles. Please see # 1.  6.  Anemia  -stable at this point 7.  Metabolic bone disease -  Add RFP to today's labs. OP labs at goal.  8.  Nutrition - Change to renal/carb mod diet with fluid restrictions. Low Albumin, add prosource supps.  9. DMT2-per primary. Use sensitive SS insulin.  10. GOC-Patient remains full code although she speaks freely about going to hospice if PD fails completely. Would recommend Palliative Care Consult and meeting with family for goals of care.     LOS: [place] @TODAY @7 :03 AM

## 2020-08-12 NOTE — Progress Notes (Signed)
PROGRESS NOTE    Samantha Conrad  FVC:[id number] DOB: July 01, 1940 DOA: 08/10/2020 PCP: [facility]    Chief Complaint  Patient presents with  . Shortness of Breath  . Altered Mental Status    Brief Narrative:  Samantha Conrad is a 80 y.o. 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, ESRD on peritoneal dialysis who presents with complaint of short of breath , hemoptysis ,fatigue, weakness over past few days with confusion prior to admission  Subjective:  Left arm edema has much improved, no acute bleeding  She is sitting up in chair, appears stronger and less edematous  Currently baseline impaired memory, no new confusion  Assessment & Plan:   Principal Problem:   NSTEMI (non-ST elevated myocardial infarction) ([place]) Active Problems:   Essential hypertension   ESRD on dialysis ([place])   CAD S/P percutaneous coronary angioplasty   CAP (community acquired pneumonia)   DM (diabetes mellitus), type 2 with renal complications ([place])  Short of breath, hemoptysis ,acute hypoxic respiratory failure -O2 88% on room air on presentation, she was put on 2 L oxygen -she appears volume overloaded, chest x-ray "Mild left basilar atelectasis and small left pleural effusion" -There is no fever, no leukocytosis,urine Legionella antigen test and urine strep pneumo antigen test negative, procalcitonin 0.12 Will d/c rocephin, continue doxycyline   Elevated troponin -History of CAD with stents placed a few years ago.  -She denies chest pain, echocardiogram pending -She was briefly on heparin drip - per cardiology, mild elevation of troponin likely demand ischemia versus from renal failure, not consistent with ACS, continue medical management, cardiology signed off  ESRD on peritoneal dialysis -She denies abdominal pain -Per nephrology there is peritoneal dialysis failure, patient does not want  hemodialysis, palliative care consulted per nephrology recommendation  Insulin-dependent type 2 diabetes, controlled A1c 6.3 with hypoglycemia Decrease Lantus, hold Tradjenta, continue SSI  Hypertension Continue Coreg  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 Continue aspirin, Lipitor She reports does not drive  Hypothyroidism Continue Synthroid  Chronic thrombocytopenia plt at baseline  FTT; start PT     Unresulted Labs (From admission, onward)          Start     Ordered   08/13/20 0500  CBC  Tomorrow morning,   R       Question:  Specimen collection method  Answer:  IV Team=IV Team collect   08/12/20 1008   08/12/20 0500  Procalcitonin  Daily,   R      08/11/20 0756   08/11/20 0755  Legionella Pneumophila Serogp 1 Ur Ag  Once,   STAT        08/11/20 0754            DVT prophylaxis: On heparin drip   Code Status:  DNR  Family Communication: Patient Disposition:   Status is: Inpatient   Dispo: The patient is from: home              Anticipated d/c is to: home, nay need home health              Anticipated d/c date is: 38-48hrs                Consultants:   Cardiology  Nephrology  Palliative care  Procedures:   None  Antimicrobials:    Anti-infectives (From admission, onward)   Start  Dose/Rate Route Frequency Ordered Stop   08/11/20 1000  doxycycline (VIBRA-TABS) tablet 100 mg        100 mg Oral Every 12 hours 08/11/20 0526     08/11/20 0530  cefTRIAXone (ROCEPHIN) 1 g in sodium chloride 0.9 % 100 mL IVPB  Status:  Discontinued        1 g 200 mL/hr over 30 Minutes Intravenous Every 24 hours 08/11/20 0526 08/12/20 1633   08/11/20 0045  cefTRIAXone (ROCEPHIN) 1 g in sodium chloride 0.9 % 100 mL IVPB        1 g 200 mL/hr over 30 Minutes Intravenous  Once 08/11/20 0031 08/11/20 0131   08/11/20 0045  azithromycin (ZITHROMAX) 500 mg in sodium chloride 0.9 % 250 mL IVPB        500 mg 250 mL/hr over 60 Minutes  Intravenous  Once 08/11/20 0031 08/11/20 0243         Objective: Vitals:   08/12/20 0901 08/12/20 0908 08/12/20 1202 08/12/20 1629  BP: (!) 142/67  (!) 132/56 (!) 143/52  Pulse: 83  77 74  Resp: 18  16 16   Temp: 97.8 F (36.6 C)  98.4 F (36.9 C) 98.4 F (36.9 C)  TempSrc: Oral  Oral Oral  SpO2: 99% 92% 95% 94%  Weight: 75.2 kg     Height:        Intake/Output Summary (Last 24 hours) at 08/12/2020 1637 Last data filed at 08/12/2020 1500 Gross per 24 hour  Intake 173 ml  Output 150 ml  Net 23 ml   Filed Weights   08/11/20 2037 08/12/20 0500 08/12/20 0901  Weight: 74.3 kg 74.3 kg 75.2 kg    Examination:  General exam: frail, chronically ill appearing, calm, NAD Respiratory system: diminished at basis, no wheezing, no rales, no rhonchi,. Respiratory effort normal. Cardiovascular system: S1 & S2 heard, RRR.  Gastrointestinal system: Abdomen is nondistended, soft and nontender. Well healed prior surgical scar. Normal bowel sounds heard. Central nervous system: Alert and orientedx2. Confused about the time, visual impairment reports at baseline Extremities:left forearm edema improved, no bleeding, bilateral lower extremity edema improved slightly Skin: No rashes, lesions or ulcers Psychiatry: Judgement and insight appear normal. Mood & affect appropriate.     Data Reviewed: I have personally reviewed following labs and imaging studies  CBC: Recent Labs  Lab 08/10/20 2014 08/11/20 0028 08/11/20 1039 08/12/20 0208  WBC 4.1  --  4.7 4.1  HGB 11.5* 10.2* 10.4* 10.8*  HCT 38.9 30.0* 34.3* 35.7*  MCV 93.1  --  91.7 91.5  PLT 82*  --  75* 75*    Basic Metabolic Panel: Recent Labs  Lab 08/10/20 2014 08/11/20 0028 08/12/20 0840  NA 137 134* 135  K 4.2 3.7 3.5  CL 98  --  98  CO2 24  --  25  GLUCOSE 88  --  176*  BUN 53*  --  53*  CREATININE 5.91*  --  6.14*  CALCIUM 9.5  --  8.7*  PHOS  --   --  3.9    GFR: Estimated Creatinine Clearance: 6.7 mL/min  (A) (by C-G formula based on SCr of 6.14 mg/dL (H)).  Liver Function Tests: Recent Labs  Lab 08/10/20 2014 08/12/20 0840  AST 22  --   ALT 22  --   ALKPHOS 127*  --   BILITOT 0.8  --   PROT 6.3*  --   ALBUMIN 2.9* 2.5*    CBG: Recent Labs  Lab 08/11/20 1741  08/11/20 2250 08/12/20 0634 08/12/20 1200 08/12/20 1604  GLUCAP 149* 277* 378* 84 131*     Recent Results (from the past 240 hour(s))  SARS CORONAVIRUS 2 (TAT 6-24 HRS) Nasopharyngeal Nasopharyngeal Swab     Status: None   Collection Time: 08/11/20  5:35 PM   Specimen: Nasopharyngeal Swab  Result Value Ref Range Status   SARS Coronavirus 2 NEGATIVE NEGATIVE Final    Comment: (NOTE) SARS-CoV-2 target nucleic acids are NOT DETECTED.  The SARS-CoV-2 RNA is generally detectable in upper and lower respiratory specimens during the acute phase of infection. Negative results do not preclude SARS-CoV-2 infection, do not rule out co-infections with other pathogens, and should not be used as the sole basis for treatment or other patient management decisions. Negative results must be combined with clinical observations, patient history, and epidemiological information. The expected result is Negative.  Fact Sheet for Patients: SugarRoll.be  Fact Sheet for Healthcare Providers: https://www.woods-mathews.com/  This test is not yet approved or cleared by the [place] FDA and  has been authorized for detection and/or diagnosis of SARS-CoV-2 by FDA under an Emergency Use Authorization (EUA). This EUA will remain  in effect (meaning this test can be used) for the duration of the COVID-19 declaration under Se ction 564(b)(1) of the Act, 21 U.S.C. section 360bbb-3(b)(1), unless the authorization is terminated or revoked sooner.  Performed at [facility], [place] [address], [place], [place] [address]          Radiology Studies: CT Head Wo Contrast  Result Date:  08/10/2020 CLINICAL DATA:  Mental status change.  Unknown cause. EXAM: CT HEAD WITHOUT CONTRAST TECHNIQUE: Contiguous axial images were obtained from the base of the skull through the vertex without intravenous contrast. COMPARISON:  CT head 05/04/2017 FINDINGS: Brain: Cerebral ventricle sizes are concordant with the degree of cerebral volume loss. Patchy and confluent areas of decreased attenuation are noted throughout the deep and periventricular white matter of the cerebral hemispheres bilaterally, compatible with chronic microvascular ischemic disease. Prior left occipital infarction. No evidence of large-territorial acute infarction. No parenchymal hemorrhage. No mass lesion. No extra-axial collection. No mass effect or midline shift. No hydrocephalus. Basilar cisterns are patent. Vascular: No hyperdense vessel. Atherosclerotic calcifications are present within the cavernous internal carotid arteries. Skull: No acute fracture or focal lesion. Sinuses/Orbits: Paranasal sinuses and mastoid air cells are clear. Bilateral lens replacement. Otherwise the orbits are unremarkable. Other: None. IMPRESSION: No acute intracranial abnormality. Electronically Signed   By: Iven Finn M.D.   On: 08/10/2020 23:15   DG Chest Port 1 View  Result Date: 08/10/2020 CLINICAL DATA:  Shortness of breath. EXAM: PORTABLE CHEST 1 VIEW COMPARISON:  03/19/2020 FINDINGS: Mild cardiomegaly. Aortic atherosclerosis. Streaky retrocardiac left lung base opacities. Suspected small pleural effusions. No pulmonary edema or pneumothorax. No acute osseous abnormalities are seen. IMPRESSION: 1. Streaky retrocardiac left lung base opacities, atelectasis versus pneumonia. 2. Suspected small pleural effusions. Electronically Signed   By: Keith Rake M.D.   On: 08/10/2020 23:06   DG Abdomen Acute W/Chest  Result Date: 08/11/2020 CLINICAL DATA:  Shortness of breath and altered mental status. EXAM: DG ABDOMEN ACUTE WITH 1 VIEW CHEST  COMPARISON:  July 13, 2020 FINDINGS: There is no evidence of dilated bowel loops or free intraperitoneal air. A peritoneal dialysis catheter is seen and unchanged in position. No radiopaque calculi are seen. Stable 1.5 cm and 0.4 cm soft tissue calcifications are seen adjacent to the L2 vertebral body on the right and  correspond to calcified lymph nodes noted within this on prior imaging. Radiopaque surgical clips are seen overlying the right upper quadrant. Heart size and mediastinal contours are within normal limits. Very mild atelectasis is seen within the left lung base. There is a small left pleural effusion. IMPRESSION: 1. Nonobstructive bowel gas pattern. 2. Mild left basilar atelectasis and small left pleural effusion. Electronically Signed   By: Virgina Norfolk M.D.   On: 08/11/2020 00:00   ECHOCARDIOGRAM COMPLETE  Result Date: 08/11/2020    ECHOCARDIOGRAM REPORT   Patient Name:   Samantha Conrad Date of Exam: 08/11/2020 Medical Rec #:  [id number]        Height:       61.5 in Accession #:    [id number]       Weight:       163.6 lb Date of Birth:  [birth year]       BSA:          1.745 m Patient Age:    13 years         BP:           130/54 mmHg Patient Gender: F                HR:           65 bpm. Exam Location:  Inpatient Procedure: 2D Echo, Cardiac Doppler and Color Doppler Indications:    NSTEMI  History:        Patient has prior history of Echocardiogram examinations, most                 recent 09/06/2018. Previous Myocardial Infarction and CAD,                 Stroke; Risk Factors:Diabetes. ESRD, cirrhosis.  Sonographer:    Dustin Flock Referring Phys: [id number] [place]  1. Left ventricular ejection fraction, by estimation, is 60 to 65%. The left ventricle has normal function. The left ventricle has no regional wall motion abnormalities. Left ventricular diastolic parameters are indeterminate.  2. Right ventricular systolic function is normal. The right ventricular  size is normal. There is normal pulmonary artery systolic pressure. The estimated right ventricular systolic pressure is 01.0 mmHg.  3. A small pericardial effusion is present. The pericardial effusion is circumferential.  4. The mitral valve is grossly normal. Mild mitral valve regurgitation. No evidence of mitral stenosis.  5. The aortic valve is tricuspid. There is mild calcification of the aortic valve. There is mild thickening of the aortic valve. Aortic valve regurgitation is not visualized. No aortic stenosis is present.  6. The inferior vena cava is normal in size with greater than 50% respiratory variability, suggesting right atrial pressure of 3 mmHg. FINDINGS  Left Ventricle: Left ventricular ejection fraction, by estimation, is 60 to 65%. The left ventricle has normal function. The left ventricle has no regional wall motion abnormalities. The left ventricular internal cavity size was normal in size. There is  no left ventricular hypertrophy. Left ventricular diastolic parameters are indeterminate. Right Ventricle: The right ventricular size is normal. No increase in right ventricular wall thickness. Right ventricular systolic function is normal. There is normal pulmonary artery systolic pressure. The tricuspid regurgitant velocity is 2.80 m/s, and  with an assumed right atrial pressure of 3 mmHg, the estimated right ventricular systolic pressure is 93.2 mmHg. Left Atrium: Left atrial size was normal in size. Right Atrium: Right atrial size was normal in size. Pericardium: A small pericardial effusion  is present. The pericardial effusion is circumferential. Mitral Valve: The mitral valve is grossly normal. There is mild calcification of the anterior and posterior mitral valve leaflet(s). Mild mitral valve regurgitation. No evidence of mitral valve stenosis. Tricuspid Valve: The tricuspid valve is grossly normal. Tricuspid valve regurgitation is trivial. No evidence of tricuspid stenosis. Aortic Valve: The  aortic valve is tricuspid. There is mild calcification of the aortic valve. There is mild thickening of the aortic valve. Aortic valve regurgitation is not visualized. No aortic stenosis is present. Pulmonic Valve: The pulmonic valve was grossly normal. Pulmonic valve regurgitation is not visualized. No evidence of pulmonic stenosis. Aorta: The aortic root and ascending aorta are structurally normal, with no evidence of dilitation. Venous: The inferior vena cava is normal in size with greater than 50% respiratory variability, suggesting right atrial pressure of 3 mmHg. IAS/Shunts: The atrial septum is grossly normal.  LEFT VENTRICLE PLAX 2D LVIDd:         4.70 cm  Diastology LVIDs:         3.70 cm  LV e' medial:    7.07 cm/s LV PW:         1.00 cm  LV E/e' medial:  16.1 LV IVS:        1.20 cm  LV e' lateral:   5.98 cm/s LVOT diam:     2.20 cm  LV E/e' lateral: 19.1 LV SV:         78 LV SV Index:   45 LVOT Area:     3.80 cm  RIGHT VENTRICLE RV Basal diam:  3.00 cm RV S prime:     8.70 cm/s TAPSE (M-mode): 2.7 cm LEFT ATRIUM             Index       RIGHT ATRIUM           Index LA diam:        2.90 cm 1.66 cm/m  RA Area:     13.10 cm LA Vol (A2C):   36.4 ml 20.86 ml/m RA Volume:   32.80 ml  18.80 ml/m LA Vol (A4C):   27.9 ml 15.99 ml/m LA Biplane Vol: 34.0 ml 19.49 ml/m  AORTIC VALVE LVOT Vmax:   89.20 cm/s LVOT Vmean:  56.000 cm/s LVOT VTI:    0.205 m  AORTA Ao Root diam: 2.50 cm MITRAL VALVE                TRICUSPID VALVE MV Area (PHT): 5.38 cm     TR Peak grad:   31.4 mmHg MV Decel Time: 141 msec     TR Vmax:        280.00 cm/s MV E velocity: 114.00 cm/s MV A velocity: 112.00 cm/s  SHUNTS MV E/A ratio:  1.02         Systemic VTI:  0.20 m                             Systemic Diam: 2.20 cm Eleonore Chiquito MD Electronically signed by Eleonore Chiquito MD Signature Date/Time: 08/11/2020/4:45:30 PM    Final         Scheduled Meds: . aspirin  81 mg Oral Daily  . atorvastatin  40 mg Oral q1800  . carvedilol   3.125 mg Oral BID WC  . clopidogrel  75 mg Oral Daily  . doxycycline  100 mg Oral Q12H  . gentamicin cream  1 application Topical Daily  .  insulin aspart  0-5 Units Subcutaneous QHS  . insulin aspart  0-9 Units Subcutaneous TID WC  . insulin glargine  10 Units Subcutaneous QHS  . levothyroxine  100 mcg Oral QAC breakfast  . lipase/protease/amylase  48,000 Units Oral TID WC  . pantoprazole  40 mg Oral Q breakfast  . sevelamer carbonate  800 mg Oral TID WC  . sodium chloride flush  3 mL Intravenous Q12H  . traZODone  50 mg Oral QHS   Continuous Infusions: . sodium chloride    . dialysis solution 4.25% low-MG/low-CA       LOS: 1 day   Time spent: 71mns Greater than 50% of this time was spent in counseling, explanation of diagnosis, planning of further management, and coordination of care.   Voice Recognition /Viviann Sparedictation system was used to create this note, attempts have been made to correct errors. Please contact the author with questions and/or clarifications.   FFlorencia Reasons MD PhD FACP [facility]  Available via Epic secure chat 7am-7pm for nonurgent issues Please page for urgent issues To page the attending provider between 7A-7P or the covering provider during after hours 7P-7A, please log into the web site [email or web address] and access using universal [place] password for that web site. If you do not have the password, please call the hospital operator.    08/12/2020, 4:37 PM

## 2020-08-12 NOTE — Progress Notes (Signed)
Inpatient Diabetes Program Recommendations  AACE/ADA: New Consensus Statement on Inpatient Glycemic Control (2015)  Target Ranges:  Prepandial:   less than 140 mg/dL      Peak postprandial:   less than 180 mg/dL (1-2 hours)      Critically ill patients:  140 - 180 mg/dL   Lab Results  Component Value Date   GLUCAP 84 08/12/2020   HGBA1C 6.3 (H) 08/11/2020    Review of Glycemic Control Results for [name], Samantha Conrad (MRN [id number]) as of 08/12/2020 13:22  Ref. Range 08/11/2020 08:12 08/11/2020 08:50 08/11/2020 11:50 08/11/2020 16:38 08/11/2020 17:41 08/11/2020 22:50 08/12/2020 06:34 08/12/2020 12:00  Glucose-Capillary Latest Ref Range: 70 - 99 mg/dL 66 (L) 74 119 (H) 134 (H) 149 (H) 277 (H) Novolog 3 units 378 (H) Novolog 8 units 84   Diabetes history: DM2 Outpatient Diabetes medications: Lantus 14 units + Glucotrol 5 mg + Actos 15 + Trajenta 5 mg Current orders for Inpatient glycemic control: Lantus 10 units + Novolog 0-9 units tid + 0-5 units hs  Inpatient Diabetes Program Recommendations:    Noted patient hypoglycemic on admission. Patient is on both Glucotrol and Actos to assist with meals. Currently eating very little here in the hospital.  Thank you, Nani Gasser. Birgitta Uhlir, RN, MSN, CDE  Diabetes Coordinator Inpatient Glycemic Control Team Team Pager [phone] (8am-5pm) 08/12/2020 1:37 PM

## 2020-08-12 NOTE — Consult Note (Signed)
Consultation Note Date: 08/12/2020   Patient Name: Samantha Conrad  DOB: Apr 12, 1941  MRN: [id number]  Age / Sex: 80 y.o., female  PCP: [facility] [place] Referring Physician: Florencia Reasons, MD  Reason for Consultation: Establishing goals of care  HPI/Patient Profile: 80 y.o. 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   Questionable CAP vs fluid overload on CXR. Elevated troponin on admission. Patient has been on CCPD long-term with possible UF failure for the past few weeks. Patient declines iHD. Palliative care consulted to assist with determining goals of care.  Clinical Assessment and Goals of Care:  I have reviewed medical records including EPIC notes, labs and imaging, received report from RN, assessed the patient and then met at the bedside along with son [name] and daughter Edwin Cap to discuss diagnosis prognosis, GOC, EOL wishes, disposition and options.  I introduced Palliative Medicine as specialized medical care for people living with serious illness. It focuses on providing relief from the symptoms and stress of a serious illness. The goal is to improve quality of life for both the patient and the family.  We discussed a brief life review of the patient and then focused on their current illness. The natural disease trajectory and expectations at EOL were discussed.  I attempted to elicit values and goals of care important to the patient.    Kurt lives home alone in [place] with her daughter living just a few units away. Her daughter Edwin Cap assists with personal care, cooking and groceries while her son [name]  assists with transportation, medical decision making, and legal/financial affairs. She ambulates with a walker and takes sponge baths, as she is cautious with the bathtub. Her husband passed away two years ago. She states that she is happy and has lived a full life. She is accepting of her own mortality and is ready to go home to the [place] if peritoneal dialysis ultimately fails. She is a Psychologist, forensic.  Zonnie shares that she tried HD access in her right arm twice previously, with terrible side effects and inability to even use the first access attempt. This experience has influenced her decision to decline iHD. She understands the seriousness of her illness, limited treatment options, and poor long-term prognosis. She has had a few weeks to think about this and has enjoyed being able to eat a slightly more liberal diet. She states "it didn't seem to make a difference what I ate. I would be home if it wasn't for this pneumonia." She recognizes that she is getting weaker and knows her own limitations.    The difference between aggressive medical intervention and comfort care was considered in light of the patient's goals of care.   Advanced directives, concepts specific to code status, artifical feeding and hydration, and rehospitalization were considered and discussed.  Hospice and Palliative Care services outpatient were explained and offered.  Discussed the importance of continued conversation with family and the medical providers regarding overall plan of care and treatment options, ensuring decisions are within the context of the patient's values and GOCs.    Questions and concerns were addressed. The family was encouraged to call with questions or concerns.  PMT will continue to support holistically.    Patient's son [name] and his wife are HCPOAs.     SUMMARY OF RECOMMENDATIONS   -DNR/DNI -Continue CCPD at night. Family would like to know UF volumes after treatment -Patient's son has  brought in MOST form and AD/Living Will. Made copies to be scanned into EMR -Psychosocial and emotional support provided -GOC are clear; patient would like to go home with hospice if peritoneal dialysis fails -Ongoing discussions pending clinical course  Code Status/Advance Care Planning:  DNR   Palliative Prophylaxis:   Bowel Regimen, Delirium Protocol and Frequent Pain Assessment  Additional Recommendations (Limitations, Scope, Preferences):  No Hemodialysis  Psycho-social/Spiritual:   Desire for further Chaplaincy support:no  Additional Recommendations: Education on Hospice  Prognosis:   Guarded prognosis given her multiple comorbidities including ESRD with possibly ineffective CCPD. Would certainly be a candidate for hospice if patient decides to pursue.   Discharge Planning: Home with services to be determined pending clinical course      Primary Diagnoses: Present on Admission: . NSTEMI (non-ST elevated myocardial infarction) ([place]) . Essential hypertension   I have reviewed the medical record, interviewed the patient and family, and examined the patient. The following aspects are pertinent.  Past Medical History:  Diagnosis Date  . Anemia   . Basal cell carcinoma     on nose/face  . CAD (coronary artery disease)    a. 08/2018: posterior STEMI s/p DES to LCx x2  . Depression   . ESRD on peritoneal dialysis ([place])    "q night" (11/29/2017)  . Fatty liver   . Hypertension   . Hypothyroidism   . NASH (nonalcoholic steatohepatitis)   . Seizures ([place])    ? when she may have had a stroke sometime in the spring of 2018  . Sleep apnea    diagnosed years ago "after I'd gained alot of weight;  no longer a problem since weight loss"  . Stroke [facility]) 04/2016   memory issues, lost 1/2 vision in both eyes  . Thrombocytopenia ([place])   . Thyroid disease   . Type II diabetes mellitus ([place])    Social History   Socioeconomic History  . Marital status: Widowed    Spouse  name: Not on file  . Number of children: Not on file  . Years of education: Not on file  . Highest education level: Not on file  Occupational History  . Not on file  Tobacco Use  . Smoking status: Never Smoker  . Smokeless tobacco: Former Systems developer    Types: Snuff  . Tobacco comment: "no snuff since < 1980s"  Vaping Use  . Vaping Use: Never used  Substance and Sexual Activity  . Alcohol use: Never  . Drug use: Never  . Sexual activity: Not Currently  Other Topics Concern  . Not on file  Social History Narrative  . Not on file   Social Determinants of Health   Financial Resource Strain: Not on file  Food Insecurity: Not on file  Transportation Needs: Not on file  Physical Activity: Not on file  Stress: Not on file  Social Connections: Not on  file   Family History  Problem Relation Age of Onset  . Tuberculosis Mother    Scheduled Meds: . aspirin  81 mg Oral Daily  . atorvastatin  40 mg Oral q1800  . carvedilol  3.125 mg Oral BID WC  . clopidogrel  75 mg Oral Daily  . doxycycline  100 mg Oral Q12H  . gentamicin cream  1 application Topical Daily  . insulin aspart  0-5 Units Subcutaneous QHS  . insulin aspart  0-9 Units Subcutaneous TID WC  . insulin glargine  10 Units Subcutaneous QHS  . levothyroxine  100 mcg Oral QAC breakfast  . lipase/protease/amylase  48,000 Units Oral TID WC  . pantoprazole  40 mg Oral Q breakfast  . sevelamer carbonate  800 mg Oral TID WC  . sodium chloride flush  3 mL Intravenous Q12H  . traZODone  50 mg Oral QHS   Continuous Infusions: . sodium chloride    . dialysis solution 4.25% low-MG/low-CA     PRN Meds:.sodium chloride, acetaminophen, dianeal solution for CAPD/CCPD with heparin, nitroGLYCERIN, sodium chloride flush Medications Prior to Admission:  Prior to Admission medications   Medication Sig Start Date End Date Taking? Authorizing Provider  acetaminophen-codeine (TYLENOL #3) 300-30 MG tablet Take 1-2 tablets by mouth every 6 (six)  hours as needed for moderate pain.   Yes [provider]  Amino Acids-Protein Hydrolys (FEEDING SUPPLEMENT, PRO-STAT SUGAR FREE 64,) LIQD Take 30 mLs by mouth 2 (two) times daily. 03/16/17  Yes Patrecia Pour, Christean Grief, MD  amLODipine (NORVASC) 5 MG tablet Take 1 tablet (5 mg total) by mouth daily. Patient taking differently: Take 5 mg by mouth at bedtime. 07/31/19 03/20/20 Yes Troy Sine, MD  aspirin 81 MG chewable tablet Chew 1 tablet (81 mg total) by mouth daily. 09/08/18  Yes Eileen Stanford, PA-C  atorvastatin (LIPITOR) 40 MG tablet TAKE 1 TABLET (40 MG TOTAL) BY MOUTH DAILY AT 6 PM. 01/13/20 03/20/20 Yes Troy Sine, MD  carvedilol (COREG) 3.125 MG tablet Take 1 tablet (3.125 mg total) by mouth 2 (two) times daily with a meal. 08/02/20 09/01/20 Yes Troy Sine, MD  cholecalciferol (VITAMIN D3) 25 MCG (1000 UNIT) tablet Take 2,000 Units by mouth daily with breakfast.   Yes [provider]  citalopram (CELEXA) 10 MG tablet Take 10 mg by mouth daily with breakfast.  10/15/17  Yes [provider]  clopidogrel (PLAVIX) 75 MG tablet TAKE 1 TABLET (75 MG TOTAL) BY MOUTH DAILY WITH BREAKFAST. 11/04/19  Yes Troy Sine, MD  docusate sodium (COLACE) 100 MG capsule Take 100 mg by mouth at bedtime.   Yes [provider]  gentamicin cream (GARAMYCIN) 0.1 % Apply 1 application topically daily as needed. 03/02/20  Yes [provider]  glipiZIDE (GLUCOTROL) 5 MG tablet Take 5 mg by mouth daily before breakfast. 06/25/18  Yes [provider]  LANTUS SOLOSTAR 100 UNIT/ML Solostar Pen Inject 14 Units into the skin at bedtime. 05/10/20  Yes [provider]  levETIRAcetam (KEPPRA) 250 MG tablet Take 250 mg by mouth 2 (two) times daily with a meal.    Yes [provider]  levothyroxine (SYNTHROID, LEVOTHROID) 100 MCG tablet Take 100 mcg by mouth daily before breakfast.  09/02/16  Yes [provider]  linagliptin (TRADJENTA) 5 MG TABS  tablet Take 5 mg by mouth daily with breakfast.    Yes [provider]  lipase/protease/amylase (CREON) 12000-38000 units CPEP capsule Take 48,000 Units by mouth 3 (three) times  daily with meals.   Yes [provider]  Methoxy PEG-Epoetin Beta (MIRCERA IJ) Inject into the skin. 03/09/20  Yes [provider]  multivitamin (PROSIGHT) TABS tablet Take 1 tablet by mouth daily. Patient taking differently: Take 1 tablet by mouth daily with breakfast. 03/17/17  Yes Patrecia Pour, Christean Grief, MD  nitroGLYCERIN (NITROSTAT) 0.4 MG SL tablet Place 0.4 mg under the tongue every 5 (five) minutes as needed for chest pain.    Yes [provider]  Nutritional Supplements (FEEDING SUPPLEMENT, NEPRO CARB STEADY,) LIQD Take 237 mLs by mouth daily. Patient taking differently: Take 237 mLs by mouth daily with lunch. 03/17/17  Yes Patrecia Pour, Christean Grief, MD  pantoprazole (PROTONIX) 40 MG tablet Take 40 mg by mouth daily with breakfast.  08/21/18  Yes [provider]  pioglitazone (ACTOS) 15 MG tablet Take 15 mg by mouth daily with breakfast. 07/10/20  Yes [provider]  polyethylene glycol (MIRALAX / GLYCOLAX) packet Take 8.5-17 g by mouth daily as needed for mild constipation.   Yes [provider]  potassium chloride SA (KLOR-CON) 20 MEQ tablet Take 20 mEq by mouth daily with breakfast.   Yes [provider]  prednisoLONE acetate (PRED FORTE) 1 % ophthalmic suspension Place 1 drop into both eyes at bedtime.    Yes [provider]  sevelamer carbonate (RENVELA) 800 MG tablet Take 800 mg by mouth 3 (three) times daily with meals.  07/04/18  Yes [provider]  torsemide (DEMADEX) 100 MG tablet Take 100 mg by mouth daily. 03/17/20  Yes [provider]  traZODone (DESYREL) 50 MG tablet Take 50 mg by mouth at bedtime.   Yes [provider]   Allergies  Allergen Reactions  . Contrast Media [Iodinated Diagnostic Agents]  Swelling    SWELLING REACTION UNSPECIFIED   . Tape Other (See Comments) and Dermatitis    Tears skin - please use paper tape Paper tape only   Review of Systems  Constitutional: Positive for appetite change, fatigue and unexpected weight change.  Eyes: Positive for visual disturbance.  Neurological: Positive for weakness.  All other systems reviewed and are negative.   Physical Exam Constitutional:      Appearance: She is not toxic-appearing.  HENT:     Head: Normocephalic and atraumatic.  Cardiovascular:     Rate and Rhythm: Normal rate and regular rhythm.  Pulmonary:     Effort: Pulmonary effort is normal.  Neurological:     Mental Status: She is alert and oriented to person, place, and time.  Psychiatric:        Mood and Affect: Mood normal.        Behavior: Behavior normal.     Vital Signs: BP (!) 136/53   Pulse 73   Temp 98.4 F (36.9 C) (Oral)   Resp 16   Ht 5' 1"  (1.549 m)   Wt 75.2 kg   SpO2 94%   BMI 31.32 kg/m  Pain Scale: 0-10   Pain Score: 0-No pain   SpO2: SpO2: 94 % O2 Device:SpO2: 94 % O2 Flow Rate: .O2 Flow Rate (L/min): 2 L/min  IO: Intake/output summary:   Intake/Output Summary (Last 24 hours) at 08/12/2020 1710 Last data filed at 08/12/2020 1500 Gross per 24 hour  Intake 173 ml  Output 150 ml  Net 23 ml    LBM: Last BM Date: 08/11/20 Baseline Weight: Weight: 73.9 kg Most recent weight: Weight: 75.2 kg     Palliative Assessment/Data:     Time  In: 12:00pm Time Out: 1:00pm  Time In: 3:00pm Time Out: 3:20pm Time Total: 80 minutes  Greater than 50% of this time was spent counseling and coordinating care related to the above assessment and plan.  Signed by: Norberta Keens, PA-C Palliative Medicine Team Team phone # [phone]  Thank you for allowing the Palliative Medicine Team to assist in the care of this patient. Please utilize secure chat with additional questions, if there is no response within 30 minutes please  call the above phone number.  Palliative Medicine Team providers are available by phone from 7am to 7pm daily and can be reached through the team cell phone.  Should this patient require assistance outside of these hours, please call the patient's attending physician.

## 2020-08-12 NOTE — Progress Notes (Signed)
ANTICOAGULATION CONSULT NOTE - Follow Up Consult  Pharmacy Consult for heparin Indication: r/o ACS  Labs: Recent Labs    08/10/20 2014 08/10/20 2329 08/11/20 0028 08/11/20 0129 08/11/20 1039 08/11/20 1423 08/12/20 0208  HGB 11.5*  --  10.2*  --  10.4*  --  10.8*  HCT 38.9  --  30.0*  --  34.3*  --  35.7*  PLT 82*  --   --   --  75*  --  75*  LABPROT  --  12.9  --   --   --   --   --   INR  --  1.0  --   --   --   --   --   HEPARINUNFRC  --   --   --   --  0.45 0.10* 0.17*  CREATININE 5.91*  --   --   --   --   --   --   TROPONINIHS  --  237*  --  215*  --   --   --     Assessment: 79yo female subtherapeutic on heparin after resumed; no gtt issues or signs of bleeding per RN, Hgb/Plt low but stable.  Goal of Therapy:  Heparin level 0.3-0.5 units/ml   Plan:  Will increase heparin gtt cautiously to 800 units/hr and check level in 8 hours.    Samantha Conrad, PharmD, BCPS  08/12/2020,3:49 AM

## 2020-08-13 ENCOUNTER — Other Ambulatory Visit: Payer: Self-pay | Admitting: Cardiovascular Disease

## 2020-08-13 DIAGNOSIS — I214 Non-ST elevation (NSTEMI) myocardial infarction: Secondary | ICD-10-CM | POA: Diagnosis not present

## 2020-08-13 LAB — CBC
HCT: 34.5 % — ABNORMAL LOW (ref 36.0–46.0)
Hemoglobin: 10.8 g/dL — ABNORMAL LOW (ref 12.0–15.0)
MCH: 28.3 pg (ref 26.0–34.0)
MCHC: 31.3 g/dL (ref 30.0–36.0)
MCV: 90.6 fL (ref 80.0–100.0)
Platelets: 75 10*3/uL — ABNORMAL LOW (ref 150–400)
RBC: 3.81 MIL/uL — ABNORMAL LOW (ref 3.87–5.11)
RDW: 16.3 % — ABNORMAL HIGH (ref 11.5–15.5)
WBC: 5.3 10*3/uL (ref 4.0–10.5)
nRBC: 0 % (ref 0.0–0.2)

## 2020-08-13 LAB — GLUCOSE, CAPILLARY: Glucose-Capillary: 265 mg/dL — ABNORMAL HIGH (ref 70–99)

## 2020-08-13 LAB — LEGIONELLA PNEUMOPHILA SEROGP 1 UR AG: L. pneumophila Serogp 1 Ur Ag: NEGATIVE

## 2020-08-13 LAB — PROCALCITONIN: Procalcitonin: 0.16 ng/mL

## 2020-08-13 MED ORDER — DOXYCYCLINE HYCLATE 100 MG PO TABS: 100.0000 mg | ORAL_TABLET | Freq: Two times a day (BID) | ORAL | 0 refills | Status: AC

## 2020-08-13 MED ORDER — INSULIN GLARGINE 100 UNIT/ML ~~LOC~~ SOLN
14.0000 [IU] | Freq: Every day | SUBCUTANEOUS | Status: DC
  Filled 2020-08-13: qty 0.14

## 2020-08-13 NOTE — Progress Notes (Signed)
[facility] Liaison RN note  Notified by [facility] of new referral for outpatient based palliative care.  Will follow for disposition.  Please call with any outpatient palliative care questions.  Thank you for allowing [place] to participate in this patient's care.  Thank you. Margaretmary Eddy, BSN, RN [facility] Liaison [phone]

## 2020-08-13 NOTE — TOC Transition Note (Signed)
Transition of Care [facility]) - CM/SW Discharge Note   Patient Details  Name: Samantha Conrad MRN: [id number] Date of Birth: [birth year]  [facility] CM/SW Contact:  Bartholomew Crews, RN Phone Number: [phone] 08/13/2020, 11:51 AM   Clinical Narrative:      Notified by MD of patient transition home today and needing outpatient palliative care referral. Spoke with patient at the bedside. PTA home alone, but her daughter lives 3 doors down and checks in on her at least daily to provide assistance with peritoneal dialysis. Discussed recommendation for outpatient palliative - patient agreeable. Referral to [facility]. She stated that her son will be providing transportation home. No further TOC needs identified at this time.   Final next level of care: Home/Self Care Barriers to Discharge: No Barriers Identified   Patient Goals and CMS Choice Patient states their goals for this hospitalization and ongoing recovery are:: return home with family support CMS Medicare.gov Compare Post Acute Care list provided to:: Patient Choice offered to / list presented to : Patient  Discharge Placement                       Discharge Plan and Services                DME Arranged: N/A DME Agency: NA       HH Arranged: NA HH Agency: NA        Social Determinants of Health (SDOH) Interventions     Readmission Risk Interventions No flowsheet data found.

## 2020-08-13 NOTE — Progress Notes (Signed)
D/C instructions given and reviewed. IV's removed, tolerated well. Son called and stated he would arrive in approx 1 hour to transport home.

## 2020-08-13 NOTE — Discharge Summary (Signed)
Discharge Summary  [name]:[id number] DOB: 04-Jun-1940  PCP: [facility] [place] date: 08/10/2020 Discharge date: 08/13/2020  Time spent: 1mns, more than 50% time spent on coordination of care.   Recommendations for Outpatient Follow-up:  1. F/u with PCP within a week  for hospital discharge follow up, repeat cbc/bmp at follow up 2. F/u with nephrology 3. Outpatient palliative care/hospice referral   Discharge Diagnoses:  Active Hospital Problems   Diagnosis Date Noted  . NSTEMI (non-ST elevated myocardial infarction) ([facility] 08/11/[facility]  . Goals of care, counseling/discussion   . Palliative care by specialist   . Shortness of breath   . CAP (community acquired pneumonia) 08/11/2020  . DM (diabetes mellitus), type 2 with renal complications ([facility] 075/02/2584 . CAD S/P percutaneous coronary angioplasty   . Essential hypertension 03/11/2017  . ESRD (end stage renal disease) on dialysis ([facility] 03/11/2017    Resolved Hospital Problems  No resolved problems to display.    Discharge Condition: stable  Diet recommendation: renal diet/carb modified  Filed Weights   08/12/20 0901 08/12/20 1715 08/13/20 0615  Weight: 75.2 kg 74.9 kg 74.7 kg    History of present illness: ( per admitting MD Dr CTonie Griffith Chief Complaint: SOB, fatigue and confusion this afternoon  HPI: HPRINCETTA UPLINGERis a 80y.o. female with medical history significant for DMT2, HTN, CAD, ESRD on peritoneal dialysis who presents with complaint of fatigue, weakness over past few days with confusion today. Ms. LRedreports that she felt poorly today but is unable to provide much information about exactly what was going on. She states that her left shoulder and neck were hurting her and that was unrelieved with medication and a heating pad. She reports that she felt short of breath and was very sweaty when the SOB occurred. She did not have chest pain. She has CAD and has 2 stents. She  is diabetic that is suboptimally controlled and recently had lantus added to her DM regimen.She is able to oriented to person place and dayof week and able to answer questions but is a poor historian. She has not had LOC, drooping face or slurred speech.  She had seen her PCP on morning of 08/10/20 and had blood work done. The episode of SOB and confusion occurred after returning home. ER provider spoke with her son who reported that they were told 3-4 weeks ago that her dialysis is no longer as efficient as it has been. He reports she has been complaining of neck pain for several days. He reports his sister called several hours later at lunch reporting that pt was confused with lots of pain in her neck and shoulder. He reports talking to his mom this evening and she was very confused and very short of breath. He reports some confusion at baseline, but this afternoon and tonight was significantly worse. He also reports she has been coughing up blood for several days. They report several days of "low grade fevers to 99.1" Pt lives alone and her children check on her. He reports no missed doses of medication including torsemide. He reports she has gained a significant amount of weight in the last few weeks. Pt took 1 tab of Tylenol #3 today for pain - son checked the pills and the count was correct.  ED Course: She is down to be hemodynamically stable.  Troponin was elevated at greater than 250.  Chest x-ray revealed a left lower lobe pneumonia.  She was started on antibiotic  in the emergency room.  ER provider consulted cardiology  Hospital Course:  Principal Problem:   NSTEMI (non-ST elevated myocardial infarction) ([place]) Active Problems:   Essential hypertension   ESRD (end stage renal disease) on dialysis ([place])   CAD S/P percutaneous coronary angioplasty   CAP (community acquired pneumonia)   DM (diabetes mellitus), type 2 with renal complications ([place])   Goals of care, counseling/discussion    Palliative care by specialist   Shortness of breath   Short of breath, hemoptysis ,acute hypoxic respiratory failure,  -O2 88% on room air on presentation, she was put on 2 L oxygen -she appears volume overloaded, chest x-ray "Mild left basilar atelectasis and small left pleural effusion" -There is no fever, no leukocytosis,urine Legionella antigen test and urine strep pneumo antigen test negative, procalcitonin 0.12 -hypoxia resolved  -rocephin d/ced , continue doxycyline  to finish  treatment course  acute metabolic encephalopathy Likely due to due to hypoxia and hypoglycemia Resolved Mental status back to baseline, she is very pleasant, aaox3 , she does has memory impairment at baseline  Elevated troponin -History of CAD with stents placed a few years ago.  -She denies chest pain, echocardiogram pending -She was briefly on heparin drip - per cardiology, mild elevation of troponin likely demand ischemia versus from renal failure, not consistent with ACS, continue medical management, cardiology signed off Per her cardiology Dr Claiborne Billings, patient is ok to stop asa, continue plavix alone, continue lipitor , coreg  ESRD on peritoneal dialysis -She denies abdominal pain -Per nephrology there is peritoneal dialysis failure, patient does not want hemodialysis, palliative care consulted , patient is very clear that she will start hospice if PD dialysis stops working, she is very clear that she does not want HD. 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 Continue aspirin, Lipitor On low dose keppra  213m bid , continue  She reports does not drive  Hypothyroidism Continue Synthroid  Chronic thrombocytopenia plt at baseline Stop asa  FTT; she has improved and did well with PT on day of discharge, no home health PT recommended     Code Status:  DNR  Family Communication: son over the phone Disposition: home      Consultants:   Cardiology  Nephrology  Palliative care  Procedures:   None  Antimicrobials:   Anti-infectives (From admission, onward)   Start     Dose/Rate Route Frequency Ordered Stop   08/13/20 0000  doxycycline (VIBRA-TABS) 100 MG tablet        100 mg Oral Every 12 hours 08/13/20 1138 08/15/20 2359   08/11/20 1000  doxycycline (VIBRA-TABS) tablet 100 mg        100 mg Oral Every 12 hours 08/11/20 0526     08/11/20 0530  cefTRIAXone (ROCEPHIN) 1 g in sodium chloride 0.9 % 100 mL IVPB  Status:  Discontinued        1 g 200 mL/hr over 30 Minutes Intravenous Every 24 hours 08/11/20 0526 08/12/20 1633   08/11/20 0045  cefTRIAXone (ROCEPHIN) 1 g in sodium chloride 0.9 % 100 mL IVPB        1 g 200 mL/hr over 30 Minutes Intravenous  Once 08/11/20 0031  08/11/20 0131   08/11/20 0045  azithromycin (ZITHROMAX) 500 mg in sodium chloride 0.9 % 250 mL IVPB        500 mg 250 mL/hr over 60 Minutes Intravenous  Once 08/11/20 0031 08/11/20 0243        Discharge Exam: BP (!) 148/65   Pulse 75   Temp 97.6 F (36.4 C) (Oral)   Resp 18   Ht 5' 1"  (1.549 m)   Wt 74.7 kg   SpO2 95%   BMI 31.12 kg/m   General: NAD, pleasant Cardiovascular: RRR Respiratory: normal respiratory effort Persistent Bilateral lower extremity edema  Discharge Instructions You were cared for by a hospitalist during your hospital stay. If you have any questions about your discharge medications or the care you received while you were in the hospital after you are discharged, you can call the unit and asked to speak with the hospitalist on call if the hospitalist that  took care of you is not available. Once you are discharged, your primary care physician will handle any further medical issues. Please note that NO REFILLS for any discharge medications will be authorized once you are discharged, as it is imperative that you return to your primary care physician (or establish a relationship with a primary care physician if you do not have one) for your aftercare needs so that they can reassess your need for medications and monitor your lab values.  Discharge Instructions    Diet - low sodium heart healthy   Complete by: As directed    Discharge wound care:   Complete by: As directed    PD exit site care: Clean skin near exit site with ChloraPrep swab sticks.  Starting at catheter, use of circular pattern around exit site, moving towards the outer edges of area covered by dressing.  Applied gentamicin cream to site once daily.  Cover with dry dressing.   Increase activity slowly   Complete by: As directed      Allergies as of 08/13/2020      Reactions   Contrast Media [iodinated Diagnostic Agents] Swelling   SWELLING REACTION UNSPECIFIED    Tape Other (See Comments), Dermatitis   Tears skin - please use paper tape Paper tape only      Medication List    STOP taking these medications   aspirin 81 MG chewable tablet   torsemide 100 MG tablet Commonly known as: DEMADEX     TAKE these medications   acetaminophen-codeine 300-30 MG tablet Commonly known as: TYLENOL #3 Take 1-2 tablets by mouth every 6 (six) hours as needed for moderate pain.   amLODipine 5 MG tablet Commonly known as: NORVASC Take 1 tablet (5 mg total) by mouth daily. What changed: when to take this   atorvastatin 40 MG tablet Commonly known as: LIPITOR TAKE 1 TABLET (40 MG TOTAL) BY MOUTH DAILY AT 6 PM.   carvedilol 3.125 MG tablet Commonly known as: COREG Take 1 tablet (3.125 mg total) by mouth 2 (two) times daily with a meal.   cholecalciferol 25 MCG (1000 UNIT)  tablet Commonly known as: VITAMIN D3 Take 2,000 Units by mouth daily with breakfast.   citalopram 10 MG tablet Commonly known as: CELEXA Take 10 mg by mouth daily with breakfast.   clopidogrel 75 MG tablet Commonly known as: PLAVIX TAKE 1 TABLET (75 MG TOTAL) BY MOUTH DAILY WITH BREAKFAST.   docusate sodium 100 MG capsule Commonly known as: COLACE Take 100 mg by mouth at bedtime.   doxycycline  100 MG tablet Commonly known as: VIBRA-TABS Take 1 tablet (100 mg total) by mouth every 12 (twelve) hours for 2 days.   feeding supplement (NEPRO CARB STEADY) Liqd Take 237 mLs by mouth daily. What changed: when to take this   feeding supplement (PRO-STAT SUGAR FREE 64) Liqd Take 30 mLs by mouth 2 (two) times daily.   gentamicin cream 0.1 % Commonly known as: GARAMYCIN Apply 1 application topically daily as needed.   glipiZIDE 5 MG tablet Commonly known as: GLUCOTROL Take 5 mg by mouth daily before breakfast.   Lantus SoloStar 100 UNIT/ML Solostar Pen Generic drug: insulin glargine Inject 14 Units into the skin at bedtime.   levETIRAcetam 250 MG tablet Commonly known as: KEPPRA Take 250 mg by mouth 2 (two) times daily with a meal.   levothyroxine 100 MCG tablet Commonly known as: SYNTHROID Take 100 mcg by mouth daily before breakfast.   linagliptin 5 MG Tabs tablet Commonly known as: TRADJENTA Take 5 mg by mouth daily with breakfast.   lipase/protease/amylase 12000-38000 units Cpep capsule Commonly known as: CREON Take 48,000 Units by mouth 3 (three) times daily with meals.   MIRCERA IJ Inject into the skin.   multivitamin Tabs tablet Take 1 tablet by mouth daily. What changed: when to take this   nitroGLYCERIN 0.4 MG SL tablet Commonly known as: NITROSTAT Place 0.4 mg under the tongue every 5 (five) minutes as needed for chest pain.   pantoprazole 40 MG tablet Commonly known as: PROTONIX Take 40 mg by mouth daily with breakfast.   pioglitazone 15 MG  tablet Commonly known as: ACTOS Take 15 mg by mouth daily with breakfast.   polyethylene glycol 17 g packet Commonly known as: MIRALAX / GLYCOLAX Take 8.5-17 g by mouth daily as needed for mild constipation.   potassium chloride SA 20 MEQ tablet Commonly known as: KLOR-CON Take 20 mEq by mouth daily with breakfast.   prednisoLONE acetate 1 % ophthalmic suspension Commonly known as: PRED FORTE Place 1 drop into both eyes at bedtime.   sevelamer carbonate 800 MG tablet Commonly known as: RENVELA Take 800 mg by mouth 3 (three) times daily with meals.   traZODone 50 MG tablet Commonly known as: DESYREL Take 50 mg by mouth at bedtime.            Discharge Care Instructions  (From admission, onward)         Start     Ordered   08/13/20 0000  Discharge wound care:       Comments: PD exit site care: Clean skin near exit site with ChloraPrep swab sticks.  Starting at catheter, use of circular pattern around exit site, moving towards the outer edges of area covered by dressing.  Applied gentamicin cream to site once daily.  Cover with dry dressing.   08/13/20 1024         Allergies  Allergen Reactions  . Contrast Media [Iodinated Diagnostic Agents] Swelling    SWELLING REACTION UNSPECIFIED   . Tape Other (See Comments) and Dermatitis    Tears skin - please use paper tape Paper tape only    Follow-up Information    [facility]. Go on 08/23/2020.   Specialty: Family Medicine Why: @11 :30am Contact information: [place] [address] [phone]        [facility] Follow up.   Specialty: PALLIATIVE CARE Why: the office will call to discuss palliative care services Contact information: [place]  [phone]               The results of significant diagnostics from this hospitalization (including imaging, microbiology, ancillary and laboratory) are listed below for  reference.    Significant Diagnostic Studies: CT Head Wo Contrast  Result Date: 08/10/2020 CLINICAL DATA:  Mental status change.  Unknown cause. EXAM: CT HEAD WITHOUT CONTRAST TECHNIQUE: Contiguous axial images were obtained from the base of the skull through the vertex without intravenous contrast. COMPARISON:  CT head 05/04/2017 FINDINGS: Brain: Cerebral ventricle sizes are concordant with the degree of cerebral volume loss. Patchy and confluent areas of decreased attenuation are noted throughout the deep and periventricular white matter of the cerebral hemispheres bilaterally, compatible with chronic microvascular ischemic disease. Prior left occipital infarction. No evidence of large-territorial acute infarction. No parenchymal hemorrhage. No mass lesion. No extra-axial collection. No mass effect or midline shift. No hydrocephalus. Basilar cisterns are patent. Vascular: No hyperdense vessel. Atherosclerotic calcifications are present within the cavernous internal carotid arteries. Skull: No acute fracture or focal lesion. Sinuses/Orbits: Paranasal sinuses and mastoid air cells are clear. Bilateral lens replacement. Otherwise the orbits are unremarkable. Other: None. IMPRESSION: No acute intracranial abnormality. Electronically Signed   By: Iven Finn M.D.   On: 08/10/2020 23:15   DG Chest Port 1 View  Result Date: 08/10/2020 CLINICAL DATA:  Shortness of breath. EXAM: PORTABLE CHEST 1 VIEW COMPARISON:  03/19/2020 FINDINGS: Mild cardiomegaly. Aortic atherosclerosis. Streaky retrocardiac left lung base opacities. Suspected small pleural effusions. No pulmonary edema or pneumothorax. No acute osseous abnormalities are seen. IMPRESSION: 1. Streaky retrocardiac left lung base opacities, atelectasis versus pneumonia. 2. Suspected small pleural effusions. Electronically Signed   By: Keith Rake M.D.   On: 08/10/2020 23:06   DG Abdomen Acute W/Chest  Result Date: 08/11/2020 CLINICAL DATA:   Shortness of breath and altered mental status. EXAM: DG ABDOMEN ACUTE WITH 1 VIEW CHEST COMPARISON:  July 13, 2020 FINDINGS: There is no evidence of dilated bowel loops or free intraperitoneal air. A peritoneal dialysis catheter is seen and unchanged in position. No radiopaque calculi are seen. Stable 1.5 cm and 0.4 cm soft tissue calcifications are seen adjacent to the L2 vertebral body on the right and correspond to calcified lymph nodes noted within this on prior imaging. Radiopaque surgical clips are seen overlying the right upper quadrant. Heart size and mediastinal contours are within normal limits. Very mild atelectasis is seen within the left lung base. There is a small left pleural effusion. IMPRESSION: 1. Nonobstructive bowel gas pattern. 2. Mild left basilar atelectasis and small left pleural effusion. Electronically Signed   By: Virgina Norfolk M.D.   On: 08/11/2020 00:00   ECHOCARDIOGRAM COMPLETE  Result Date: 08/11/2020    ECHOCARDIOGRAM REPORT   Patient Name:   Samantha Conrad Date of Exam: 08/11/2020 Medical Rec #:  [id number]        Height:       61.5 in Accession #:    [id number]       Weight:       163.6 lb Date of Birth:  July 26, 1940       BSA:          1.745 m Patient Age:    35 years         BP:           130/54 mmHg Patient Gender: F                HR:  65 bpm. Exam Location:  Inpatient Procedure: 2D Echo, Cardiac Doppler and Color Doppler Indications:    NSTEMI  History:        Patient has prior history of Echocardiogram examinations, most                 recent 09/06/2018. Previous Myocardial Infarction and CAD,                 Stroke; Risk Factors:Diabetes. ESRD, cirrhosis.  Sonographer:    Dustin Flock Referring Phys: [id number] [place]  1. Left ventricular ejection fraction, by estimation, is 60 to 65%. The left ventricle has normal function. The left ventricle has no regional wall motion abnormalities. Left ventricular diastolic parameters are  indeterminate.  2. Right ventricular systolic function is normal. The right ventricular size is normal. There is normal pulmonary artery systolic pressure. The estimated right ventricular systolic pressure is 32.5 mmHg.  3. A small pericardial effusion is present. The pericardial effusion is circumferential.  4. The mitral valve is grossly normal. Mild mitral valve regurgitation. No evidence of mitral stenosis.  5. The aortic valve is tricuspid. There is mild calcification of the aortic valve. There is mild thickening of the aortic valve. Aortic valve regurgitation is not visualized. No aortic stenosis is present.  6. The inferior vena cava is normal in size with greater than 50% respiratory variability, suggesting right atrial pressure of 3 mmHg. FINDINGS  Left Ventricle: Left ventricular ejection fraction, by estimation, is 60 to 65%. The left ventricle has normal function. The left ventricle has no regional wall motion abnormalities. The left ventricular internal cavity size was normal in size. There is  no left ventricular hypertrophy. Left ventricular diastolic parameters are indeterminate. Right Ventricle: The right ventricular size is normal. No increase in right ventricular wall thickness. Right ventricular systolic function is normal. There is normal pulmonary artery systolic pressure. The tricuspid regurgitant velocity is 2.80 m/s, and  with an assumed right atrial pressure of 3 mmHg, the estimated right ventricular systolic pressure is 49.8 mmHg. Left Atrium: Left atrial size was normal in size. Right Atrium: Right atrial size was normal in size. Pericardium: A small pericardial effusion is present. The pericardial effusion is circumferential. Mitral Valve: The mitral valve is grossly normal. There is mild calcification of the anterior and posterior mitral valve leaflet(s). Mild mitral valve regurgitation. No evidence of mitral valve stenosis. Tricuspid Valve: The tricuspid valve is grossly normal.  Tricuspid valve regurgitation is trivial. No evidence of tricuspid stenosis. Aortic Valve: The aortic valve is tricuspid. There is mild calcification of the aortic valve. There is mild thickening of the aortic valve. Aortic valve regurgitation is not visualized. No aortic stenosis is present. Pulmonic Valve: The pulmonic valve was grossly normal. Pulmonic valve regurgitation is not visualized. No evidence of pulmonic stenosis. Aorta: The aortic root and ascending aorta are structurally normal, with no evidence of dilitation. Venous: The inferior vena cava is normal in size with greater than 50% respiratory variability, suggesting right atrial pressure of 3 mmHg. IAS/Shunts: The atrial septum is grossly normal.  LEFT VENTRICLE PLAX 2D LVIDd:         4.70 cm  Diastology LVIDs:         3.70 cm  LV e' medial:    7.07 cm/s LV PW:         1.00 cm  LV E/e' medial:  16.1 LV IVS:        1.20 cm  LV e' lateral:  5.98 cm/s LVOT diam:     2.20 cm  LV E/e' lateral: 19.1 LV SV:         78 LV SV Index:   45 LVOT Area:     3.80 cm  RIGHT VENTRICLE RV Basal diam:  3.00 cm RV S prime:     8.70 cm/s TAPSE (M-mode): 2.7 cm LEFT ATRIUM             Index       RIGHT ATRIUM           Index LA diam:        2.90 cm 1.66 cm/m  RA Area:     13.10 cm LA Vol (A2C):   36.4 ml 20.86 ml/m RA Volume:   32.80 ml  18.80 ml/m LA Vol (A4C):   27.9 ml 15.99 ml/m LA Biplane Vol: 34.0 ml 19.49 ml/m  AORTIC VALVE LVOT Vmax:   89.20 cm/s LVOT Vmean:  56.000 cm/s LVOT VTI:    0.205 m  AORTA Ao Root diam: 2.50 cm MITRAL VALVE                TRICUSPID VALVE MV Area (PHT): 5.38 cm     TR Peak grad:   31.4 mmHg MV Decel Time: 141 msec     TR Vmax:        280.00 cm/s MV E velocity: 114.00 cm/s MV A velocity: 112.00 cm/s  SHUNTS MV E/A ratio:  1.02         Systemic VTI:  0.20 m                             Systemic Diam: 2.20 cm Eleonore Chiquito MD Electronically signed by Eleonore Chiquito MD Signature Date/Time: 08/11/2020/4:45:30 PM    Final      Microbiology: Recent Results (from the past 240 hour(s))  SARS CORONAVIRUS 2 (TAT 6-24 HRS) Nasopharyngeal Nasopharyngeal Swab     Status: None   Collection Time: 08/11/20  5:35 PM   Specimen: Nasopharyngeal Swab  Result Value Ref Range Status   SARS Coronavirus 2 NEGATIVE NEGATIVE Final    Comment: (NOTE) SARS-CoV-2 target nucleic acids are NOT DETECTED.  The SARS-CoV-2 RNA is generally detectable in upper and lower respiratory specimens during the acute phase of infection. Negative results do not preclude SARS-CoV-2 infection, do not rule out co-infections with other pathogens, and should not be used as the sole basis for treatment or other patient management decisions. Negative results must be combined with clinical observations, patient history, and epidemiological information. The expected result is Negative.  Fact Sheet for Patients: SugarRoll.be  Fact Sheet for Healthcare Providers: https://www.woods-mathews.com/  This test is not yet approved or cleared by the [place] FDA and  has been authorized for detection and/or diagnosis of SARS-CoV-2 by FDA under an Emergency Use Authorization (EUA). This EUA will remain  in effect (meaning this test can be used) for the duration of the COVID-19 declaration under Se ction 564(b)(1) of the Act, 21 U.S.C. section 360bbb-3(b)(1), unless the authorization is terminated or revoked sooner.  Performed at [facility], [place] [address], [place], [place] [address]      Labs: Basic Metabolic Panel: Recent Labs  Lab 08/10/20 2014 08/11/20 0028 08/12/20 0840  NA 137 134* 135  K 4.2 3.7 3.5  CL 98  --  98  CO2 24  --  25  GLUCOSE 88  --  176*  BUN 53*  --  53*  CREATININE 5.91*  --  6.14*  CALCIUM 9.5  --  8.7*  PHOS  --   --  3.9   Liver Function Tests: Recent Labs  Lab 08/10/20 2014 08/12/20 0840  AST 22  --   ALT 22  --   ALKPHOS 127*  --   BILITOT 0.8  --    PROT 6.3*  --   ALBUMIN 2.9* 2.5*   Recent Labs  Lab 08/10/20 2329  LIPASE 18   Recent Labs  Lab 08/10/20 2329  AMMONIA 22   CBC: Recent Labs  Lab 08/10/20 2014 08/11/20 0028 08/11/20 1039 08/12/20 0208 08/13/20 0403  WBC 4.1  --  4.7 4.1 5.3  HGB 11.5* 10.2* 10.4* 10.8* 10.8*  HCT 38.9 30.0* 34.3* 35.7* 34.5*  MCV 93.1  --  91.7 91.5 90.6  PLT 82*  --  75* 75* 75*   Cardiac Enzymes: No results for input(s): CKTOTAL, CKMB, CKMBINDEX, TROPONINI in the last 168 hours. BNP: BNP (last 3 results) No results for input(s): BNP in the last 8760 hours.  ProBNP (last 3 results) No results for input(s): PROBNP in the last 8760 hours.  CBG: Recent Labs  Lab 08/12/20 0634 08/12/20 1200 08/12/20 1604 08/12/20 2118 08/13/20 0542  GLUCAP 378* 84 131* 314* 265*       Signed:  Florencia Reasons MD, PhD, FACP  [facility] 08/13/2020, 11:52 AM

## 2020-08-13 NOTE — Evaluation (Signed)
Physical Therapy Evaluation & Discharge Patient Details Name: Samantha Conrad MRN: [id number] DOB: 1941-05-12 Today's Date: 08/13/2020   History of Present Illness  Pt is an 80 y.o. female admitted 08/10/20 with SOB, hemoptysis, weakness, AMS. Workup for acute hypoxic respiratory failure, apparent volume overload; elevated troponin likely demand ischemia vs renal failure. PMH includes DM2, HTN, CAD, CVA (residual R hemianopia), mild memory impairment, ESRD on PD.    Clinical Impression  Patient evaluated by Physical Therapy with no further acute PT needs identified. PTA, pt mod indep ambulator with rollator; lives alone in apartment, with daughter living in same complex; pt's children assist with iADL tasks. Today, pt mod indep with mobility and ADL tasks; pt with awareness of cognitive impairment. All education has been completed and the patient has no further questions. Acute PT is signing off. Thank you for this referral.  SpO2 89-90% on RA post-ambulation, HR 77-84    Follow Up Recommendations No PT follow up;Supervision - Intermittent    Equipment Recommendations  None recommended by PT    Recommendations for Other Services       Precautions / Restrictions Precautions Precautions: Fall;Other (comment) Precaution Comments: H/o R hemianopia from prior stroke; peritoneal dialysis port R-side abdomen Restrictions Weight Bearing Restrictions: No      Mobility  Bed Mobility Overal bed mobility: Modified Independent             General bed mobility comments: Received standing at sink independently    Transfers Overall transfer level: Independent Equipment used: None                Ambulation/Gait Ambulation/Gait assistance: Modified independent (Device/Increase time) Gait Distance (Feet): 250 Feet Assistive device: None;Rolling walker (2 wheeled) Gait Pattern/deviations: Step-through pattern;Decreased stride length Gait velocity: Decreased   General Gait  Details: Slow, guarded gait mod indep in room with intermittent UE support on furniture; pt requesting RW use for hallway ambulation, mod indep. Good ability to scan to R-side due to visual impairments  Stairs            Wheelchair Mobility    Modified Rankin (Stroke Patients Only)       Balance Overall balance assessment: Mild deficits observed, not formally tested                                           Pertinent Vitals/Pain Pain Assessment: No/denies pain    Home Living Family/patient expects to be discharged to:: Private residence Living Arrangements: Alone Available Help at Discharge: Family;Available PRN/intermittently Type of Home: Apartment Home Access: Level entry     Home Layout: One level Home Equipment: Tub bench;Cane - single point;Walker - 4 wheels Additional Comments: Daughter lives in same apartment complex    Prior Function Level of Independence: Needs assistance   Gait / Transfers Assistance Needed: Furniture surfs in home without DME; rollator for community ambulation. Manages peritoneal dialysis at home  ADL's / Homemaking Assistance Needed: Pt sponges bathes at sink (not comfortable stepping over tub). Daughter lives in same apt complex and assist with household tasks as needed. Son assists with medication management, finances, transportation        Hand Dominance        Extremity/Trunk Assessment   Upper Extremity Assessment Upper Extremity Assessment: Overall WFL for tasks assessed    Lower Extremity Assessment Lower Extremity Assessment: Overall WFL for tasks assessed  Communication   Communication: No difficulties  Cognition Arousal/Alertness: Awake/alert Behavior During Therapy: WFL for tasks assessed/performed Overall Cognitive Status: History of cognitive impairments - at baseline                                 General Comments: A&O x3-4 (able to state March 2020-something; reports  she has trouble remembering year). H/o memory impairment from prior stroke. Answering questions and following simple commands appropriately; reports difficulty with directions/navigation (I even have trouble finding my own house). Pt aware of cognitive impairment      General Comments General comments (skin integrity, edema, etc.): Educ re: activity recommendations, energy conservation (handout provided)    Exercises     Assessment/Plan    PT Assessment Patent does not need any further PT services  PT Problem List         PT Treatment Interventions      PT Goals (Current goals can be found in the Care Plan section)  Acute Rehab PT Goals PT Goal Formulation: All assessment and education complete, DC therapy    Frequency     Barriers to discharge        Co-evaluation               AM-PAC PT "6 Clicks" Mobility  Outcome Measure Help needed turning from your back to your side while in a flat bed without using bedrails?: None Help needed moving from lying on your back to sitting on the side of a flat bed without using bedrails?: None Help needed moving to and from a bed to a chair (including a wheelchair)?: None Help needed standing up from a chair using your arms (e.g., wheelchair or bedside chair)?: None Help needed to walk in hospital room?: None Help needed climbing 3-5 steps with a railing? : A Little 6 Click Score: 23    End of Session Equipment Utilized During Treatment: Gait belt Activity Tolerance: Patient tolerated treatment well Patient left: in chair;with call bell/phone within reach Nurse Communication: Mobility status PT Visit Diagnosis: Other abnormalities of gait and mobility (R26.89)    Time: 2820-6015 PT Time Calculation (min) (ACUTE ONLY): 24 min   Charges:   PT Evaluation $PT Eval Moderate Complexity: Dennard, PT, DPT Acute Rehabilitation Services  Pager [phone] Office [phone]  Derry Lory 08/13/2020, 8:52  AM

## 2020-08-13 NOTE — Progress Notes (Signed)
[facility] Progress Note   Assessment/ Plan:   Dialysis Orders: Center:[facility]. 7X Week, EDW 63.5 (kg) Ca 2.5 (mEq/L) Mg 0.5 (mEq/L), Dextrose 1.5; 2.5; 4.25 %, # Exchanges 5, fill vol 2250 mL cyc therapy vol 11250 mL cyc therapy time 10 hrs 0 min, Day Exchanges 0 -Heparin 6000 units IV 7 days a week   Assessment/Plan: 1. Volume overload/PD failure: Have been attempting as OP to lower volume using high concentrationdianealand torsemide. UF has been variable. Albumin 2.9 SCr 5.9 (probably underestimated). Use all 4.25% going forward, icodextrin as an OP was not successful.  D/c torsemide as she doesn't make a whole lot of urine anymore.  She declined transition to HD.  She has UF failure.  Discussed with pt and and son- she will need OP pall care and close monitoring.   2. NSTEMI-believe this is demand ischemia from volume excess. Cards consulted, Per Primary 3. Possible CAP-does not appear toxic. WBC 4.1 afebrile. Started ABX per primary.  4. ESRD - CCPD X 7 days. Will continue tonight. 5. Hypertension/volume -Well controlled. Edematous from waist to ankles. Please see # 1. 6. Anemia -stable at this point 7. Metabolic bone disease - Add RFP to today's labs. OP labs at goal. 8. Nutrition - Change to renal/carb mod diet with fluid restrictions. Low Albumin, add prosource supps.  9. DMT2-per primary. Use sensitive SS insulin.  10. GOC- She is a DNR.    Subjective:    Seen in room.  For d/c today. She is very clear with me- going to HD is unacceptable and she wants to continue with PD as long as she can.     Objective:   BP (!) 148/65   Pulse 75   Temp 97.6 F (36.4 C) (Oral)   Resp 18   Ht 5' 1"  (1.549 m)   Wt 74.7 kg   SpO2 95%   BMI 31.12 kg/m   Physical Exam: Gen: NAD, lying in bed CVS: RRR Resp: clear Abd: soft, PD cath intact Ext:2+ anasarca  Labs: BMET Recent Labs  Lab 08/10/20 2014 08/11/20 0028 08/12/20 0840   NA 137 134* 135  K 4.2 3.7 3.5  CL 98  --  98  CO2 24  --  25  GLUCOSE 88  --  176*  BUN 53*  --  53*  CREATININE 5.91*  --  6.14*  CALCIUM 9.5  --  8.7*  PHOS  --   --  3.9   CBC Recent Labs  Lab 08/10/20 2014 08/11/20 0028 08/11/20 1039 08/12/20 0208 08/13/20 0403  WBC 4.1  --  4.7 4.1 5.3  HGB 11.5* 10.2* 10.4* 10.8* 10.8*  HCT 38.9 30.0* 34.3* 35.7* 34.5*  MCV 93.1  --  91.7 91.5 90.6  PLT 82*  --  75* 75* 75*      Medications:    . aspirin  81 mg Oral Daily  . atorvastatin  40 mg Oral q1800  . carvedilol  3.125 mg Oral BID WC  . clopidogrel  75 mg Oral Daily  . doxycycline  100 mg Oral Q12H  . gentamicin cream  1 application Topical Daily  . insulin aspart  0-5 Units Subcutaneous QHS  . insulin aspart  0-9 Units Subcutaneous TID WC  . insulin glargine  14 Units Subcutaneous QHS  . levothyroxine  100 mcg Oral QAC breakfast  . lipase/protease/amylase  48,000 Units Oral TID WC  . pantoprazole  40 mg Oral Q breakfast  . sevelamer carbonate  800 mg Oral TID WC  . sodium chloride flush  3 mL Intravenous Q12H  . traZODone  50 mg Oral QHS     Madelon Lips, MD 08/13/2020, 11:44 AM

## 2020-08-13 NOTE — Plan of Care (Signed)
  Problem: Education: Goal: Knowledge of General Education information will improve Description: Including pain rating scale, medication(s)/side effects and non-pharmacologic comfort measures Outcome: Adequate for Discharge   

## 2020-08-13 NOTE — Progress Notes (Signed)
Pt completed PD. UF 298 mls

## 2020-08-17 ENCOUNTER — Telehealth: Payer: Self-pay

## 2020-08-17 NOTE — Telephone Encounter (Signed)
Spoke with patient and scheduled an in-person Palliative Consult for 08/30/20 @ 12:30PM  COVID screening was negative. No pets in home. Patient lives with alone, but son and daughter helps her.   Consent obtained; updated Outlook/Netsmart/Team List and Epic.  Family is aware they may be receiving a call from NP the day before or day of to confirm appointment.

## 2020-08-25 ENCOUNTER — Other Ambulatory Visit: Payer: Self-pay | Admitting: Cardiovascular Disease

## 2020-08-30 ENCOUNTER — Other Ambulatory Visit: Payer: Self-pay | Admitting: Student

## 2020-08-30 ENCOUNTER — Other Ambulatory Visit: Payer: Self-pay

## 2020-08-30 DIAGNOSIS — Z992 Dependence on renal dialysis: Secondary | ICD-10-CM

## 2020-08-30 DIAGNOSIS — N186 End stage renal disease: Secondary | ICD-10-CM

## 2020-08-30 DIAGNOSIS — Z515 Encounter for palliative care: Secondary | ICD-10-CM

## 2020-08-30 NOTE — Progress Notes (Signed)
  [facility] Consult Note Telephone: [phone]  Fax: [phone]  PATIENT NAME: Samantha Conrad [address] [place] [address] [phone] (home)  DOB: 11/01/1940 MRN: [id number]  PRIMARY CARE PROVIDER:    [facility],  [address] [place] [address] [phone]  REFERRING PROVIDER:   [facility] [address] [place],  [place] [address] [phone]  RESPONSIBLE PARTY:   Extended Emergency Contact Information Primary Emergency Contact: McNeil,Ricky          [place], [place] Home Phone: [phone] Mobile Phone: [phone] Relation: Son Secondary Emergency Contact: Greene,Alesia  [place] Mobile Phone: [phone] Relation: Daughter  I met face to face with patient and family in home.  ASSESSMENT AND RECOMMENDATIONS:   Advance Care Planning: Visit at the request of [facility] for palliative consult. Visit consisted of building trust and discussions on Palliative care medicine as specialized medical care for people living with serious illness, aimed at facilitating improved quality of life through symptoms relief, assisting with advance care planning and establishing goals of care. Education provided on Palliative Medicine vs. Hospice services. Palliative care will continue to provide support to patient, family and the medical team.  Patient would like to continue peritoneal dialysis; she is aware that there is PD failure. She is clear that she would like to continue for now; she does not want to transition to hemodialysis. We discussed hospice admission when PD stops working. She would like to transition to local hospice home as she does not have live in support. We discussed worsening symptoms.   Goal of care: To continue peritoneal dialysis for now. Does not want to transition to hemodialysis.  Directives: MOST  form, DNR  Symptom Management:   ESRD-patient receiving peritoneal dialysis. Continue PD as directed. Will reach out to nephrology regarding recommendations given increase in weight, LE edema. Torsemide recently discontinued. Follow up with nephrology as directed. Education provided on adhering to fluid restriction, monitoring salt/sodium intake, elevating legs while in chair or bed.   Follow up Palliative Care Visit: Palliative care will continue to follow for complex decision making and symptom management. Return in 4 weeks or prn.  Family /Caregiver/Community Supports: Palliative Medicine will continue to provide support.   I spent 45 minutes providing this consultation, time includes time spent with patient/family, chart review, provider coordination, and documentation. More than 50% of the time in this consultation was spent counseling and coordinating communication.   CHIEF COMPLAINT: Palliative Medicine initial consult, ESRD.  History obtained from review of EMR, discussion with primary team, and  interview with family. Records reviewed and summarized below.  HISTORY OF PRESENT ILLNESS:  Samantha Conrad is a 79 y.o. year old female with multiple medical problems including ESRD with peritoneal dialysis, T2DM, CAD s/p percutaneous angioplasty, essential hypertension, NSTEMI. Patient recently hospitalized 3/15-3/18/2022 with metabolic encephalopathy, CAP. Palliative Care was asked to follow this patient by consultation request of [facility] to help address advance care planning and goals of care. This is an visit.  Patient resides at home alone. Her daughter lives nearby and comes daily to assist with PD and her son visits regularly and assists with care, medications. She does report feeling more fatigued, short of breath with exertion. Son Ricky states patient's dry weight is 140 pounds; she was 161 pounds prior to recent hospitalization, 165 upon hospital discharge. Today she  is 166 pounds.     CODE STATUS: DNR  PPS: 50%  HOSPICE ELIGIBILITY/DIAGNOSIS: TBD  ROS   General: NAD EYES: denies vision changes, wears glasses ENMT: denies dysphagia Cardiovascular: denies chest pain Pulmonary: denies cough, increased SOB with exertion Abdomen: endorses fair appetite GU: oliguric MSK: no falls reported Skin: denies rashes or wounds Neurological: endorses weakness, denies pain, denies insomnia Psych: Endorses positive mood Heme/lymph/immuno: denies bruises, abnormal bleeding   Physical Exam: Pulse 72, respirations 16, blood pressure 160/80, sats 96% on room air Constitutional: NAD General: frail appearing EYES: anicteric sclera, lids intact, no discharge  ENMT: intact hearing,oral mucous membranes moist CV: RRR,  1+ LE edema Pulmonary: LCTA, no increased work of breathing, no cough Abdomen: BS normoactive x 4, PD catheter right abd GU: deferred MSK: ambulatory Skin: warm and dry, no rashes or wounds on visible skin Neuro: Generalized weakness Psych: non-anxious affect today Hem/lymph/immuno: no widespread bruising   PAST MEDICAL HISTORY:  Past Medical History:  Diagnosis Date  . Anemia   . Basal cell carcinoma     on nose/face  . CAD (coronary artery disease)    a. 08/2018: posterior STEMI s/p DES to LCx x2  . Depression   . ESRD on peritoneal dialysis (HCC)    "q night" (11/29/2017)  . Fatty liver   . Hypertension   . Hypothyroidism   . NASH (nonalcoholic steatohepatitis)   . Seizures (HCC)    ? when she may have had a stroke sometime in the spring of 2018  . Sleep apnea    diagnosed years ago "after I'd gained alot of weight;  no longer a problem since weight loss"  . Stroke (HCC) 04/2016   memory issues, lost 1/2 vision in both eyes  . Thrombocytopenia (HCC)   . Thyroid disease   . Type II diabetes mellitus (HCC)     SOCIAL HX:  Social History   Tobacco Use  . Smoking status: Never Smoker  . Smokeless tobacco: Former User    Types:  Snuff  . Tobacco comment: "no snuff since < 1980s"  Substance Use Topics  . Alcohol use: Never   FAMILY HX:  Family History  Problem Relation Age of Onset  . Tuberculosis Mother     ALLERGIES:  Allergies  Allergen Reactions  . Contrast Media [Iodinated Diagnostic Agents] Swelling    SWELLING REACTION UNSPECIFIED   . Tape Other (See Comments) and Dermatitis    Tears skin - please use paper tape Paper tape only     PERTINENT MEDICATIONS:  Outpatient Encounter Medications as of 08/30/2020  Medication Sig  . acetaminophen-codeine (TYLENOL #3) 300-30 MG tablet Take 1-2 tablets by mouth every 6 (six) hours as needed for moderate pain.  . Amino Acids-Protein Hydrolys (FEEDING SUPPLEMENT, PRO-STAT SUGAR FREE 64,) LIQD Take 30 mLs by mouth 2 (two) times daily.  . amLODipine (NORVASC) 5 MG tablet TAKE 1 TABLET BY MOUTH EVERY DAY  . atorvastatin (LIPITOR) 40 MG tablet TAKE 1 TABLET (40 MG TOTAL) BY MOUTH DAILY AT 6 PM.  . carvedilol (COREG) 3.125 MG tablet TAKE 1 TABLET (3.125 MG TOTAL) BY MOUTH 2 (TWO) TIMES DAILY WITH A MEAL.  . cholecalciferol (VITAMIN D3) 25 MCG (1000 UNIT) tablet Take 2,000 Units by mouth daily with breakfast.  . citalopram (CELEXA) 10 MG tablet Take 10 mg by mouth daily with breakfast.   . clopidogrel (PLAVIX) 75 MG tablet TAKE 1 TABLET (75 MG TOTAL) BY MOUTH DAILY WITH BREAKFAST.  . docusate sodium (COLACE) 100 MG capsule Take 100 mg by   mouth at bedtime.  . gentamicin cream (GARAMYCIN) 0.1 % Apply 1 application topically daily as needed.  . glipiZIDE (GLUCOTROL) 5 MG tablet Take 5 mg by mouth daily before breakfast.  . LANTUS SOLOSTAR 100 UNIT/ML Solostar Pen Inject 14 Units into the skin at bedtime.  . levETIRAcetam (KEPPRA) 250 MG tablet Take 250 mg by mouth 2 (two) times daily with a meal.   . levothyroxine (SYNTHROID, LEVOTHROID) 100 MCG tablet Take 100 mcg by mouth daily before breakfast.   . linagliptin (TRADJENTA) 5 MG TABS tablet Take 5 mg by mouth daily with  breakfast.   . lipase/protease/amylase (CREON) 12000-38000 units CPEP capsule Take 48,000 Units by mouth 3 (three) times daily with meals.  . Methoxy PEG-Epoetin Beta (MIRCERA IJ) Inject into the skin.  . multivitamin (PROSIGHT) TABS tablet Take 1 tablet by mouth daily. (Patient taking differently: Take 1 tablet by mouth daily with breakfast.)  . nitroGLYCERIN (NITROSTAT) 0.4 MG SL tablet Place 0.4 mg under the tongue every 5 (five) minutes as needed for chest pain.   . Nutritional Supplements (FEEDING SUPPLEMENT, NEPRO CARB STEADY,) LIQD Take 237 mLs by mouth daily. (Patient taking differently: Take 237 mLs by mouth daily with lunch.)  . pantoprazole (PROTONIX) 40 MG tablet Take 40 mg by mouth daily with breakfast.   . pioglitazone (ACTOS) 15 MG tablet Take 15 mg by mouth daily with breakfast.  . polyethylene glycol (MIRALAX / GLYCOLAX) packet Take 8.5-17 g by mouth daily as needed for mild constipation.  . potassium chloride SA (KLOR-CON) 20 MEQ tablet Take 20 mEq by mouth daily with breakfast.  . prednisoLONE acetate (PRED FORTE) 1 % ophthalmic suspension Place 1 drop into both eyes at bedtime.   . sevelamer carbonate (RENVELA) 800 MG tablet Take 800 mg by mouth 3 (three) times daily with meals.   . traZODone (DESYREL) 50 MG tablet Take 50 mg by mouth at bedtime.   No facility-administered encounter medications on file as of 08/30/2020.     Thank you for the opportunity to participate in the care of Ms. Washko. The palliative care team will continue to follow. Please call our office at [phone] if we can be of additional assistance.    

## 2020-09-01 ENCOUNTER — Telehealth: Payer: Self-pay | Admitting: Student

## 2020-09-01 NOTE — Telephone Encounter (Signed)
Palliative NP spoke with nurse Drue Dun regarding patient with worsening symptoms, PD failure. She will check with Dr. Johnney Ou regarding the torsemide, further intervention as it was stopped when patient discharged from the hospital.

## 2020-09-19 ENCOUNTER — Other Ambulatory Visit: Payer: Self-pay | Admitting: Cardiovascular Disease

## 2020-09-24 ENCOUNTER — Other Ambulatory Visit: Payer: Self-pay

## 2020-09-24 ENCOUNTER — Other Ambulatory Visit: Payer: Medicare Other | Admitting: Student

## 2020-09-24 DIAGNOSIS — Z992 Dependence on renal dialysis: Secondary | ICD-10-CM

## 2020-09-24 DIAGNOSIS — R0602 Shortness of breath: Secondary | ICD-10-CM

## 2020-09-24 DIAGNOSIS — Z515 Encounter for palliative care: Secondary | ICD-10-CM

## 2020-09-24 DIAGNOSIS — N186 End stage renal disease: Secondary | ICD-10-CM

## 2020-09-24 DIAGNOSIS — R601 Generalized edema: Secondary | ICD-10-CM

## 2020-09-24 NOTE — Progress Notes (Signed)
[facility] Palliative Care Consult Note Telephone: [phone]  Fax: [phone]    Date of encounter: 09/24/20 PATIENT NAME: Samantha Conrad [address] [place] [address]   [phone] (home)  DOB: 25-Sep-1940 MRN: [id number] PRIMARY CARE PROVIDER:    Physicians, [facility],  [address] [place] [address] [phone]  REFERRING PROVIDER:   Physicians, [place],  [place] [address] [phone]  RESPONSIBLE PARTY:    Contact Information    Name Relation Home Work [place] Son [phone]  [phone]   Samantha Conrad   [phone]       I met face to face with patient and family in the home. Palliative Care was asked to follow this patient by consultation request of  [facility] to address advance care planning and complex medical decision making. This is a follow up visit.                                   ASSESSMENT AND PLAN / RECOMMENDATIONS:   Advance Care Planning/Goals of Care: Goals include to maximize quality of life and symptom management. Our advance care planning conversation included a discussion about:     The value and importance of advance care planning   Experiences with loved ones who have been seriously ill or have died   Exploration of personal, cultural or spiritual beliefs that might influence medical decisions   Exploration of goals of care in the event of a sudden injury or illness   Identification and preparation of a healthcare agent   Review and updating or creation of an  advance directive document .  Decision not to resuscitate or to de-escalate disease focused treatments due to poor prognosis.  MOST form reviewed; no changes  CODE STATUS: DNR  Discussed with Hospice medical director; patient will be referred for hospice admission. Dx of ESRD w/peritoneal dialysis, CAD, CVA. Spoke with Dr. Stevphen Meuse; she  will serve as hospice attending.   Symptom Management/Plan:  ESRD-patient receiving peritoneal dialysis, with peritoneal failure. She has opted to not transition to HD. Patient is agreeable to hospice services given worsening symptoms of fatigue, shortness of breath, edema. Requiring more assistance with adl's. Continue PD as directed. Shortness of breath: Recommend oxygen via nasal canula at 2lpm prn shortness of breath. Oxygen sats dropped to 87% with ambulating approximately 15 feet. Sats increased to 95% after 5 minutes. Edema: continue to elevate legs while in bed or recliner.   Follow up Palliative Care Visit: Palliative care will continue to follow for complex medical decision making, advance care planning, and clarification of goals through admission to Hospice services. Return as needed.  I spent 60  minutes providing this consultation. More than 50% of the time in this consultation was spent in counseling and care coordination.   PPS: 50%, weak  HOSPICE ELIGIBILITY/DIAGNOSIS: TBD  Chief Complaint: Palliative Medicine  HISTORY OF PRESENT ILLNESS:  Samantha Conrad is a 80 y.o. year old female  with  ESRD receiving peritoneal dialysis. Dx also include CVA, T2DM, CAD s/p percutaneous angioplasty, essential hypertension, NSTEMI. Patient recently hospitalized 6/16-8/37/2902 with metabolic encephalopathy, CAP.  Patient resides at home. Reports worsening fatigue, tires easily, shortness of breath since last Palliative visit. She has peritoneal failure; continues PD at home. Having more difficulty setting up PD. Her dry weight is 140 pounds;  she has stayed around 165 pounds. Not receiving any diuretics; they were discontinued as they were no longer working. She continues to have 2+ LE edema; weeping at times she reports decline in appetite; eating about 25% of meals and drinks nutritional supplements. Her blood sugars have been between 200-320m/dL. Her Lantus was increased in past couple of  weeks; son has been instructed by PCP to increase her Lantus to 18 units tonight due to blood sugars still being elevated and over 2072mdL. She is requiring more assistance with adl's. No falls reported. Upcoming appointment Tuesday for lab work. Son Samantha Pilitates her last hemoglobin was 9.3; record unavailable.    History obtained from review of EMR, discussion with primary team, and interview with family, facility staff/caregiver and/or Samantha Conrad.  I reviewed available labs, medications, imaging, studies and related documents from the EMR.  Records reviewed and summarized above.   ROS General: NAD EYES: denies vision changes ENMT: denies dysphagia Cardiovascular: denies chest pain, DOE Pulmonary: cough, increased SOB Abdomen: endorses poor appetite, denies constipation GU: oliguric MSK: weakness,  no falls reported Skin: denies rashes or wounds Neurological: denies pain, denies insomnia Psych: Endorses positive mood Heme/lymph/immuno: denies bruises, abnormal bleeding  Physical Exam: Current and past weights: 165 pounds today Constitutional: NAD General: frail appearing, ill appearing, face is puffy EYES: anicteric sclera, lids intact, no discharge  ENMT: intact hearing, oral mucous membranes moist, dentition intact CV: S1S2, RRR, 2+ pitting LE edema Pulmonary: LCTA, dyspnea with exertion; sats 87% after ambulating approx 15 ft Abdomen: normo-active BS + 4 quadrants, soft and non tender GU: deferred MSK: moves all extremities, ambulatory Skin: warm and dry, no rashes or wounds on visible skin, scabs to legs, mild erythema to BLE Neuro: generalized weakness, increased forgetfulness, repeats herself today Psych: non-anxious affect, A and O x 2 Hem/lymph/immuno: no widespread bruising   Thank you for the opportunity to participate in the care of Samantha Conrad.  The palliative care team will continue to follow. Please call our office at [phone] we can be of additional  assistance.   Samantha SlocumbNP   COVID-19 PATIENT SCREENING TOOL Asked and negative response unless otherwise noted:   Have you had symptoms of covid, tested positive or been in contact with someone with symptoms/positive test in the past 5-10 days? No

## 2020-09-27 ENCOUNTER — Telehealth: Payer: Self-pay | Admitting: Student

## 2020-09-27 NOTE — Telephone Encounter (Signed)
Palliative NP returned call to kidney center nurse Drue Dun. Discussed patient to be evaluated by Hospice tomorrow at 330pm. She will continue peritoneal dialysis.

## 2020-10-11 ENCOUNTER — Inpatient Hospital Stay (HOSPITAL_COMMUNITY)
Admission: EM | Admit: 2020-10-11 | Discharge: 2020-10-14 | DRG: 480 | Disposition: A | Discharge status: Hospice | Attending: Internal Medicine | Admitting: Internal Medicine

## 2020-10-11 ENCOUNTER — Emergency Department (HOSPITAL_COMMUNITY)

## 2020-10-11 ENCOUNTER — Encounter (HOSPITAL_COMMUNITY): Payer: Self-pay

## 2020-10-11 DIAGNOSIS — E1122 Type 2 diabetes mellitus with diabetic chronic kidney disease: Secondary | ICD-10-CM | POA: Diagnosis present

## 2020-10-11 DIAGNOSIS — E8770 Fluid overload, unspecified: Secondary | ICD-10-CM | POA: Diagnosis present

## 2020-10-11 DIAGNOSIS — N186 End stage renal disease: Secondary | ICD-10-CM | POA: Diagnosis present

## 2020-10-11 DIAGNOSIS — K7581 Nonalcoholic steatohepatitis (NASH): Secondary | ICD-10-CM | POA: Diagnosis not present

## 2020-10-11 DIAGNOSIS — Z515 Encounter for palliative care: Secondary | ICD-10-CM | POA: Diagnosis not present

## 2020-10-11 DIAGNOSIS — Z947 Corneal transplant status: Secondary | ICD-10-CM

## 2020-10-11 DIAGNOSIS — W19XXXA Unspecified fall, initial encounter: Secondary | ICD-10-CM

## 2020-10-11 DIAGNOSIS — W010XXA Fall on same level from slipping, tripping and stumbling without subsequent striking against object, initial encounter: Secondary | ICD-10-CM | POA: Diagnosis present

## 2020-10-11 DIAGNOSIS — Z66 Do not resuscitate: Secondary | ICD-10-CM | POA: Diagnosis present

## 2020-10-11 DIAGNOSIS — T148XXA Other injury of unspecified body region, initial encounter: Secondary | ICD-10-CM

## 2020-10-11 DIAGNOSIS — E8889 Other specified metabolic disorders: Secondary | ICD-10-CM | POA: Diagnosis present

## 2020-10-11 DIAGNOSIS — Z91048 Other nonmedicinal substance allergy status: Secondary | ICD-10-CM

## 2020-10-11 DIAGNOSIS — M84452A Pathological fracture, left femur, initial encounter for fracture: Secondary | ICD-10-CM | POA: Diagnosis present

## 2020-10-11 DIAGNOSIS — Z7189 Other specified counseling: Secondary | ICD-10-CM | POA: Diagnosis not present

## 2020-10-11 DIAGNOSIS — E039 Hypothyroidism, unspecified: Secondary | ICD-10-CM | POA: Diagnosis not present

## 2020-10-11 DIAGNOSIS — D649 Anemia, unspecified: Secondary | ICD-10-CM | POA: Diagnosis not present

## 2020-10-11 DIAGNOSIS — Z87891 Personal history of nicotine dependence: Secondary | ICD-10-CM

## 2020-10-11 DIAGNOSIS — D631 Anemia in chronic kidney disease: Secondary | ICD-10-CM | POA: Diagnosis present

## 2020-10-11 DIAGNOSIS — Z79890 Hormone replacement therapy (postmenopausal): Secondary | ICD-10-CM

## 2020-10-11 DIAGNOSIS — E785 Hyperlipidemia, unspecified: Secondary | ICD-10-CM | POA: Diagnosis not present

## 2020-10-11 DIAGNOSIS — G40909 Epilepsy, unspecified, not intractable, without status epilepticus: Secondary | ICD-10-CM | POA: Diagnosis present

## 2020-10-11 DIAGNOSIS — Z9049 Acquired absence of other specified parts of digestive tract: Secondary | ICD-10-CM

## 2020-10-11 DIAGNOSIS — R569 Unspecified convulsions: Secondary | ICD-10-CM | POA: Diagnosis not present

## 2020-10-11 DIAGNOSIS — I252 Old myocardial infarction: Secondary | ICD-10-CM

## 2020-10-11 DIAGNOSIS — I10 Essential (primary) hypertension: Secondary | ICD-10-CM

## 2020-10-11 DIAGNOSIS — Z794 Long term (current) use of insulin: Secondary | ICD-10-CM | POA: Diagnosis not present

## 2020-10-11 DIAGNOSIS — Z992 Dependence on renal dialysis: Secondary | ICD-10-CM | POA: Diagnosis not present

## 2020-10-11 DIAGNOSIS — M25552 Pain in left hip: Secondary | ICD-10-CM | POA: Diagnosis not present

## 2020-10-11 DIAGNOSIS — Z6831 Body mass index (BMI) 31.0-31.9, adult: Secondary | ICD-10-CM

## 2020-10-11 DIAGNOSIS — N2581 Secondary hyperparathyroidism of renal origin: Secondary | ICD-10-CM | POA: Diagnosis present

## 2020-10-11 DIAGNOSIS — I120 Hypertensive chronic kidney disease with stage 5 chronic kidney disease or end stage renal disease: Secondary | ICD-10-CM | POA: Diagnosis present

## 2020-10-11 DIAGNOSIS — Z85828 Personal history of other malignant neoplasm of skin: Secondary | ICD-10-CM

## 2020-10-11 DIAGNOSIS — Z9841 Cataract extraction status, right eye: Secondary | ICD-10-CM

## 2020-10-11 DIAGNOSIS — J811 Chronic pulmonary edema: Secondary | ICD-10-CM | POA: Diagnosis present

## 2020-10-11 DIAGNOSIS — I2510 Atherosclerotic heart disease of native coronary artery without angina pectoris: Secondary | ICD-10-CM | POA: Diagnosis not present

## 2020-10-11 DIAGNOSIS — E1129 Type 2 diabetes mellitus with other diabetic kidney complication: Secondary | ICD-10-CM | POA: Diagnosis present

## 2020-10-11 DIAGNOSIS — Z79899 Other long term (current) drug therapy: Secondary | ICD-10-CM

## 2020-10-11 DIAGNOSIS — Z419 Encounter for procedure for purposes other than remedying health state, unspecified: Secondary | ICD-10-CM

## 2020-10-11 DIAGNOSIS — S72002S Fracture of unspecified part of neck of left femur, sequela: Secondary | ICD-10-CM | POA: Diagnosis not present

## 2020-10-11 DIAGNOSIS — M810 Age-related osteoporosis without current pathological fracture: Secondary | ICD-10-CM | POA: Diagnosis present

## 2020-10-11 DIAGNOSIS — Z9861 Coronary angioplasty status: Secondary | ICD-10-CM

## 2020-10-11 DIAGNOSIS — K7460 Unspecified cirrhosis of liver: Secondary | ICD-10-CM | POA: Diagnosis present

## 2020-10-11 DIAGNOSIS — Z7901 Long term (current) use of anticoagulants: Secondary | ICD-10-CM

## 2020-10-11 DIAGNOSIS — D62 Acute posthemorrhagic anemia: Secondary | ICD-10-CM | POA: Diagnosis not present

## 2020-10-11 DIAGNOSIS — M25512 Pain in left shoulder: Secondary | ICD-10-CM | POA: Diagnosis present

## 2020-10-11 DIAGNOSIS — E079 Disorder of thyroid, unspecified: Secondary | ICD-10-CM | POA: Diagnosis present

## 2020-10-11 DIAGNOSIS — Z961 Presence of intraocular lens: Secondary | ICD-10-CM | POA: Diagnosis present

## 2020-10-11 DIAGNOSIS — D696 Thrombocytopenia, unspecified: Secondary | ICD-10-CM | POA: Diagnosis present

## 2020-10-11 DIAGNOSIS — F32A Depression, unspecified: Secondary | ICD-10-CM | POA: Diagnosis present

## 2020-10-11 DIAGNOSIS — Z20822 Contact with and (suspected) exposure to covid-19: Secondary | ICD-10-CM | POA: Diagnosis present

## 2020-10-11 DIAGNOSIS — Z91041 Radiographic dye allergy status: Secondary | ICD-10-CM

## 2020-10-11 DIAGNOSIS — Z8673 Personal history of transient ischemic attack (TIA), and cerebral infarction without residual deficits: Secondary | ICD-10-CM

## 2020-10-11 DIAGNOSIS — S72002A Fracture of unspecified part of neck of left femur, initial encounter for closed fracture: Secondary | ICD-10-CM | POA: Diagnosis present

## 2020-10-11 DIAGNOSIS — Z9842 Cataract extraction status, left eye: Secondary | ICD-10-CM

## 2020-10-11 DIAGNOSIS — G4733 Obstructive sleep apnea (adult) (pediatric): Secondary | ICD-10-CM | POA: Diagnosis present

## 2020-10-11 DIAGNOSIS — Z90710 Acquired absence of both cervix and uterus: Secondary | ICD-10-CM

## 2020-10-11 DIAGNOSIS — Z9981 Dependence on supplemental oxygen: Secondary | ICD-10-CM

## 2020-10-11 DIAGNOSIS — H1131 Conjunctival hemorrhage, right eye: Secondary | ICD-10-CM

## 2020-10-11 DIAGNOSIS — M79605 Pain in left leg: Secondary | ICD-10-CM

## 2020-10-11 LAB — CBC WITH DIFFERENTIAL/PLATELET
Abs Immature Granulocytes: 0.07 10*3/uL (ref 0.00–0.07)
Basophils Absolute: 0 10*3/uL (ref 0.0–0.1)
Basophils Relative: 0 %
Eosinophils Absolute: 0.2 10*3/uL (ref 0.0–0.5)
Eosinophils Relative: 2 %
HCT: 32.3 % — ABNORMAL LOW (ref 36.0–46.0)
Hemoglobin: 9.4 g/dL — ABNORMAL LOW (ref 12.0–15.0)
Immature Granulocytes: 1 %
Lymphocytes Relative: 15 %
Lymphs Abs: 1 10*3/uL (ref 0.7–4.0)
MCH: 28.5 pg (ref 26.0–34.0)
MCHC: 29.1 g/dL — ABNORMAL LOW (ref 30.0–36.0)
MCV: 97.9 fL (ref 80.0–100.0)
Monocytes Absolute: 0.7 10*3/uL (ref 0.1–1.0)
Monocytes Relative: 11 %
Neutro Abs: 5 10*3/uL (ref 1.7–7.7)
Neutrophils Relative %: 71 %
Platelets: 82 10*3/uL — ABNORMAL LOW (ref 150–400)
RBC: 3.3 MIL/uL — ABNORMAL LOW (ref 3.87–5.11)
RDW: 18 % — ABNORMAL HIGH (ref 11.5–15.5)
WBC: 7 10*3/uL (ref 4.0–10.5)
nRBC: 0 % (ref 0.0–0.2)

## 2020-10-11 LAB — TYPE AND SCREEN
ABO/RH(D): A POS
Antibody Screen: NEGATIVE

## 2020-10-11 LAB — RESP PANEL BY RT-PCR (FLU A&B, COVID) ARPGX2
Influenza A by PCR: NEGATIVE
Influenza B by PCR: NEGATIVE
SARS Coronavirus 2 by RT PCR: NEGATIVE

## 2020-10-11 LAB — PROTIME-INR
INR: 1.1 (ref 0.8–1.2)
Prothrombin Time: 14 seconds (ref 11.4–15.2)

## 2020-10-11 LAB — BASIC METABOLIC PANEL
Anion gap: 14 (ref 5–15)
BUN: 51 mg/dL — ABNORMAL HIGH (ref 8–23)
CO2: 25 mmol/L (ref 22–32)
Calcium: 8.8 mg/dL — ABNORMAL LOW (ref 8.9–10.3)
Chloride: 96 mmol/L — ABNORMAL LOW (ref 98–111)
Creatinine, Ser: 6.13 mg/dL — ABNORMAL HIGH (ref 0.44–1.00)
GFR, Estimated: 6 mL/min — ABNORMAL LOW (ref >60)
Glucose, Bld: 104 mg/dL — ABNORMAL HIGH (ref 70–99)
Potassium: 3.8 mmol/L (ref 3.5–5.1)
Sodium: 135 mmol/L (ref 135–145)

## 2020-10-11 LAB — CBG MONITORING, ED: Glucose-Capillary: 120 mg/dL — ABNORMAL HIGH (ref 70–99)

## 2020-10-11 MED ORDER — SEVELAMER CARBONATE 800 MG PO TABS
800.0000 mg | ORAL_TABLET | Freq: Three times a day (TID) | ORAL | Status: DC
  Administered 2020-10-12 – 2020-10-13 (×4): 800 mg via ORAL
  Filled 2020-10-11 (×4): qty 1

## 2020-10-11 MED ORDER — LEVETIRACETAM 250 MG PO TABS
250.0000 mg | ORAL_TABLET | Freq: Two times a day (BID) | ORAL | Status: DC
  Administered 2020-10-12 – 2020-10-14 (×6): 250 mg via ORAL
  Filled 2020-10-11 (×7): qty 1

## 2020-10-11 MED ORDER — POLYETHYLENE GLYCOL 3350 17 G PO PACK: 17.0000 g | PACK | Freq: Every day | ORAL | Status: DC | PRN

## 2020-10-11 MED ORDER — ONDANSETRON HCL 4 MG/2ML IJ SOLN
4.0000 mg | Freq: Four times a day (QID) | INTRAMUSCULAR | Status: DC | PRN
  Administered 2020-10-11: 4 mg via INTRAVENOUS

## 2020-10-11 MED ORDER — INSULIN ASPART 100 UNIT/ML IJ SOLN
0.0000 [IU] | INTRAMUSCULAR | Status: DC
  Administered 2020-10-12: 1 [IU] via SUBCUTANEOUS

## 2020-10-11 MED ORDER — ONDANSETRON HCL 4 MG/2ML IJ SOLN
INTRAMUSCULAR | Status: AC
  Filled 2020-10-11: qty 2

## 2020-10-11 MED ORDER — LEVOTHYROXINE SODIUM 100 MCG PO TABS
100.0000 ug | ORAL_TABLET | Freq: Every day | ORAL | Status: DC
  Administered 2020-10-12 – 2020-10-14 (×3): 100 ug via ORAL
  Filled 2020-10-11 (×3): qty 1

## 2020-10-11 MED ORDER — MORPHINE SULFATE (PF) 2 MG/ML IV SOLN
0.5000 mg | INTRAVENOUS | Status: DC | PRN
  Administered 2020-10-12 – 2020-10-14 (×3): 0.5 mg via INTRAVENOUS
  Filled 2020-10-11 (×3): qty 1

## 2020-10-11 MED ORDER — METHOCARBAMOL 1000 MG/10ML IJ SOLN: 500.0000 mg | Freq: Four times a day (QID) | INTRAVENOUS | Status: DC | PRN

## 2020-10-11 MED ORDER — HYDROCODONE-ACETAMINOPHEN 5-325 MG PO TABS
1.0000 | ORAL_TABLET | Freq: Four times a day (QID) | ORAL | Status: DC | PRN
  Administered 2020-10-12: 1 via ORAL
  Administered 2020-10-13 – 2020-10-14 (×5): 2 via ORAL
  Filled 2020-10-11: qty 2
  Filled 2020-10-11: qty 1
  Filled 2020-10-11 (×5): qty 2

## 2020-10-11 MED ORDER — INSULIN GLARGINE 100 UNIT/ML ~~LOC~~ SOLN
5.0000 [IU] | Freq: Every day | SUBCUTANEOUS | Status: DC
  Administered 2020-10-12 – 2020-10-13 (×2): 5 [IU] via SUBCUTANEOUS
  Filled 2020-10-11 (×5): qty 0.05

## 2020-10-11 MED ORDER — ATORVASTATIN CALCIUM 40 MG PO TABS
40.0000 mg | ORAL_TABLET | Freq: Every day | ORAL | Status: DC
  Administered 2020-10-12: 40 mg via ORAL
  Filled 2020-10-11: qty 1

## 2020-10-11 MED ORDER — TRAZODONE HCL 50 MG PO TABS
50.0000 mg | ORAL_TABLET | Freq: Every day | ORAL | Status: DC
  Administered 2020-10-11 – 2020-10-13 (×3): 50 mg via ORAL
  Filled 2020-10-11 (×3): qty 1

## 2020-10-11 MED ORDER — PANTOPRAZOLE SODIUM 40 MG PO TBEC
40.0000 mg | DELAYED_RELEASE_TABLET | Freq: Every day | ORAL | Status: DC
  Administered 2020-10-12 – 2020-10-14 (×3): 40 mg via ORAL
  Filled 2020-10-11 (×3): qty 1

## 2020-10-11 MED ORDER — METHOCARBAMOL 500 MG PO TABS
500.0000 mg | ORAL_TABLET | Freq: Four times a day (QID) | ORAL | Status: DC | PRN
  Administered 2020-10-14: 500 mg via ORAL
  Filled 2020-10-11 (×2): qty 1

## 2020-10-11 MED ORDER — MORPHINE SULFATE (PF) 4 MG/ML IV SOLN
4.0000 mg | Freq: Once | INTRAVENOUS | Status: AC
Start: 2020-10-11 — End: 2020-10-11
  Administered 2020-10-11: 4 mg via INTRAVENOUS
  Filled 2020-10-11: qty 1

## 2020-10-11 MED ORDER — FENTANYL CITRATE (PF) 100 MCG/2ML IJ SOLN
50.0000 ug | INTRAMUSCULAR | Status: AC | PRN
  Administered 2020-10-11 (×2): 50 ug via INTRAVENOUS
  Filled 2020-10-11 (×2): qty 2

## 2020-10-11 MED ORDER — AMLODIPINE BESYLATE 5 MG PO TABS
5.0000 mg | ORAL_TABLET | Freq: Every day | ORAL | Status: DC
  Administered 2020-10-11 – 2020-10-13 (×3): 5 mg via ORAL
  Filled 2020-10-11 (×3): qty 1

## 2020-10-11 MED ORDER — CARVEDILOL 3.125 MG PO TABS
3.1250 mg | ORAL_TABLET | Freq: Two times a day (BID) | ORAL | Status: DC
  Administered 2020-10-12 – 2020-10-14 (×6): 3.125 mg via ORAL
  Filled 2020-10-11 (×6): qty 1

## 2020-10-11 NOTE — H&P (Signed)
Samantha Conrad:[id number] DOB: Mar 19, 1941 DOA: 10/11/2020     PCP: [facility]   Outpatient Specialists:   CARDS:   Dr. Claiborne Billings    Patient arrived to ER on 10/11/20 at 1912 Referred by Attending Horton, Alvin Critchley, DO   Patient coming from: home Lives alone,       Chief Complaint:   Chief Complaint  Patient presents with  . Fall    HPI: Samantha Conrad is a 80 y.o. female with medical history significant of  CAD, ESRD on PD, HTN, DM2, on O2 as needed history of CVA hypothyroidism NASH questional history of seizures in the past, sleep apnea,    Presented with   Falls today Left hip pain and left shoulder pain  Lost her balance today and fell backwards.  Hit her left side hip and shoulder no hitting head.  No LOC.  Not able to walk since the fall.  Normally walks with a walker patient is on hospice  She lives at home has family help with medications and aid comes twice a week to help with ADL's She can make her own breakfast and daughter cooks dinner for her. At her baseline she walks with a walker she is unable to walk to the mailbox secondary to severe generalized fatigue.  Unable to go up the stairs secondary to the same does not endorse any chest pain or shortness of breath at rest Patient was just recently admitted in March 2022 for NSTEMI  Patient has end-stage renal disease currently on peritoneal dialysis with peritoneal dialysis failure but patient did not want to go to HD plan for another continue peritoneal dialysis until it feels completely at which point she would like to be on hospice only she has been followed by palliative care as an outpatient She is on 2 L of home oxygen as needed for shortness of breath usually her oxygen drops down to 87% with ambulation without it.  Infectious risk factors:  Reports  shortness of breath,     Has   been vaccinated against COVID  and boosted   Initial COVID TEST  NEGATIVE   Lab Results  Component  Value Date   [place] NEGATIVE 10/11/2020   [place] NEGATIVE 08/11/2020   [place] NEGATIVE 03/19/2020     Regarding pertinent Chronic problems:     Hyperlipidemia -  on statins Lipitor Lipid Panel     Component Value Date/Time   CHOL 113 08/11/2020 1039   TRIG 88 08/11/2020 1039   HDL 54 08/11/2020 1039   CHOLHDL 2.1 08/11/2020 1039   VLDL 18 08/11/2020 1039   LDLCALC 41 08/11/2020 1039     HTN on Coreg   hz of Seizures on Keppra    CAD  - On, statin, betablocker, Plavix                 - followed by cardiology                -status post to DES Aspirin recently stopped Recent NSTEMI Last cardiac echogram was in March 2022 showed preserved EF intermediate left ventricular diastolic parameters  DM 2 -  Lab Results  Component Value Date   HGBA1C 6.3 (H) 08/11/2020   on insulin,    Hypothyroidism:  Lab Results  Component Value Date   TSH 3.439 09/07/2018   on synthroid     OSA -  CPAP not using    Hx of CVA -  with/out residual  deficits on  Plavix  End-stage renal disease on peritoneal dialysis has refused HD in the past  Lab Results  Component Value Date   CREATININE 6.13 (H) 10/11/2020   CREATININE 6.14 (H) 08/12/2020   CREATININE 5.91 (H) 08/10/2020     Liver disease MELD-Na score: 21 at 08/12/2020       Chronic anemia - baseline hg Hemoglobin & Hematocrit  Recent Labs    08/12/20 0208 08/13/20 0403 10/11/20 2001  HGB 10.8* 10.8* 9.4*    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 --      Excl. in [place]? --   TMAX(24)@     _________________________________________ Significant initial  Findings: Abnormal Labs Reviewed  BASIC METABOLIC PANEL - Abnormal; Notable for the following components:      Result Value   Chloride 96 (*)    Glucose, Bld 104 (*)    BUN 51 (*)    Creatinine, Ser 6.13 (*)    Calcium 8.8 (*)    GFR, Estimated 6 (*)    All other components within normal limits  CBC WITH DIFFERENTIAL/PLATELET - Abnormal; Notable for the following components:   RBC 3.30 (*)    Hemoglobin 9.4 (*)    HCT 32.3 (*)    MCHC 29.1 (*)    RDW 18.0 (*)    Platelets 82 (*)    All other components within normal limits   ____________________________________________ Ordered CT HEAD  NON acute  CXR -   NON acute  Pelvic imaging showing femoral neck hip fracture on the left   ___________________ Troponin  ordered ECG: Ordered Personally reviewed by me showing: HR : 89 Rhythm:  NSR    no evidence of ischemic changes QTC 477 ________________  The recent clinical data is shown below. Vitals:   10/11/20 2030 10/11/20 2045 10/11/20 2100 10/11/20 2130  BP: (!) 140/56 (!) 155/62 (!) 160/66 (!) 159/73  Pulse: 87 89 88 91  Resp: 16 19 17  (!) 26  Temp:      TempSrc:      SpO2: 97% 98% 98% 97%  Weight:      Height:        WBC     Component Value Date/Time   WBC 7.0 10/11/2020 2001   LYMPHSABS 1.0 10/11/2020 2001   MONOABS 0.7 10/11/2020 2001   EOSABS 0.2 10/11/2020 2001   BASOSABS 0.0 10/11/2020 2001       Results for orders placed or performed during the hospital encounter of 10/11/20  Resp Panel by RT-PCR (Flu A&B, Covid) Nasopharyngeal Swab     Status: None   Collection Time: 10/11/20  9:06 PM   Specimen: Nasopharyngeal  Swab; Nasopharyngeal(NP) swabs in vial transport medium  Result Value Ref Range Status   SARS Coronavirus 2 by RT PCR NEGATIVE NEGATIVE Final          Influenza A by PCR NEGATIVE NEGATIVE Final   Influenza B by PCR NEGATIVE NEGATIVE Final          _______________________________________________________ ER Provider Called: Orthopedics     Dr. Doreatha Martin They Recommend admit to medicine n.p.o. for midnight Will see in AM   ER Provider Called: nephrology     Dr. Hollie Salk They Recommend admit to medicine  PD orders will be placed by nephrology Will see in AM   _______________________________________________ Hospitalist was called for admission for left femoral hip fracture  The following Work up has been ordered so far:  Orders Placed This Encounter  Procedures  . Resp Panel by RT-PCR (Flu A&B, Covid) Nasopharyngeal Swab  . CT HEAD WO CONTRAST  . CT CERVICAL SPINE WO CONTRAST  . DG Chest Port 1 View  . DG Hip Unilat With Pelvis 2-3 Views Left  . Basic metabolic panel  . CBC WITH DIFFERENTIAL  . Protime-INR  . Urinalysis, Routine w reflex microscopic  . Diet NPO time specified  . Diet NPO time specified  . Check CMS  . Bed rest  . Initiate Carrier Fluid Protocol  . Consult to orthopedic surgery  . Consult to hospitalist  . ED EKG  . EKG 12-Lead  . Type and screen [place]    Following Medications were ordered in ER: Medications  fentaNYL (SUBLIMAZE) injection 50 mcg (50 mcg Intravenous Given 10/11/20 2045)        Consult Orders  (From admission, onward)         Start     Ordered   10/11/20 2206  Consult to hospitalist  Paged by Jerene Pitch  Once       Provider:  (Not yet assigned)  Question Answer Comment  Place call to: [facility]   Reason for Consult Admit      10/11/20 2205          OTHER Significant initial  Findings:  labs showing:    Recent Labs  Lab 10/11/20 2001  NA 135  K 3.8  CO2 25  GLUCOSE 104*  BUN 51*  CREATININE 6.13*  CALCIUM 8.8*    Cr   stable,   Lab Results  Component Value Date   CREATININE 6.13 (H) 10/11/2020   CREATININE 6.14 (H) 08/12/2020    CREATININE 5.91 (H) 08/10/2020    No results for input(s): AST, ALT, ALKPHOS, BILITOT, PROT, ALBUMIN in the last 168 hours. Lab Results  Component Value Date   CALCIUM 8.8 (L) 10/11/2020   PHOS 3.9 08/12/2020         Plt: Lab Results  Component Value Date   PLT 82 (L) 10/11/2020      Recent Labs  Lab 10/11/20 2001  WBC 7.0  NEUTROABS 5.0  HGB 9.4*  HCT 32.3*  MCV 97.9  PLT 82*    HG/HCT stable,      Component Value Date/Time   HGB 9.4 (L) 10/11/2020 2001   HCT 32.3 (L) 10/11/2020 2001   MCV 97.9 10/11/2020 2001        DM  labs:  HbA1C: Recent Labs    08/11/20 1039  HGBA1C 6.3*       CBG (last 3)  Recent Labs    10/11/20 2318  GLUCAP 120*     Cultures:  Component Value Date/Time   SDES BLOOD LEFT HAND 03/19/2020 2213   SPECREQUEST  03/19/2020 2213    BOTTLES DRAWN AEROBIC AND ANAEROBIC Blood Culture results may not be optimal due to an inadequate volume of blood received in culture bottles   CULT  03/19/2020 2213    NO GROWTH 5 DAYS Performed at [facility], [place] [address], [place], [place] [address]    REPTSTATUS 03/24/2020 FINAL 03/19/2020 2213     Radiological Exams on Admission: CT HEAD WO CONTRAST  Result Date: 10/11/2020 CLINICAL DATA:  Fall with head trauma EXAM: CT HEAD WITHOUT CONTRAST CT CERVICAL SPINE WITHOUT CONTRAST TECHNIQUE: Multidetector CT imaging of the head and cervical spine was performed following the standard protocol without intravenous contrast. Multiplanar CT image reconstructions of the cervical spine were also generated. COMPARISON:  August 10, 2020 FINDINGS: CT HEAD FINDINGS Brain: No evidence of acute large vascular territory infarction, hemorrhage, hydrocephalus, extra-axial collection or mass lesion/mass effect. Age related global parenchymal volume loss with ex vacuo dilatation of ventricular system. Stable burden of chronic microvascular disease ischemic disease. Encephalomalacia in left occipital lobe with ex  vacuo dilatation of the occipital horn of the lateral ventricle, sequela of prior insult. Mineralization of bilateral basal ganglia. Posterior fossa arachnoid cyst. Vascular: No hyperdense vessel. Atherosclerotic calcifications of the internal carotid arteries at skull base. Skull: Hyperostosis interna.  Negative for fracture or focal lesion. Sinuses/Orbits: The visualized paranasal sinuses and mastoid air cells are predominantly clear. Other: None CT CERVICAL SPINE FINDINGS Alignment: Preservation of the normal cervical lordosis. Grade 1 degenerative anterolisthesis of C3 on C4 and C6 on C7. No evidence of traumatic listhesis. Skull base and vertebrae: No acute fracture. No primary bone lesion or focal pathologic process. Soft tissues and spinal canal: No prevertebral fluid or swelling. No visible canal hematoma. Disc levels: Lower cervical predominant multilevel degenerative change of the cervical spine with disc space narrowing osteophytosis and uncovertebral/facet hypertrophy. Upper chest: Small left greater than right layering pleural effusions with adjacent compressive atelectasis. Other: None IMPRESSION: 1. No evidence of acute intracranial pathology. 2. No evidence of acute fracture or traumatic listhesis of the cervical spine. 3. Lower cervical predominant multilevel degenerative change of the cervical spine. 4. Small left greater than right layering pleural effusions with adjacent compressive atelectasis. Electronically Signed   By: Dahlia Bailiff MD   On: 10/11/2020 21:54   CT CERVICAL SPINE WO CONTRAST  Result Date: 10/11/2020 CLINICAL DATA:  Fall with head trauma EXAM: CT HEAD WITHOUT CONTRAST CT CERVICAL SPINE WITHOUT CONTRAST TECHNIQUE: Multidetector CT imaging of the head and cervical spine was performed following the standard protocol without intravenous contrast. Multiplanar CT image reconstructions of the cervical spine were also generated. COMPARISON:  August 10, 2020 FINDINGS: CT HEAD  FINDINGS Brain: No evidence of acute large vascular territory infarction, hemorrhage, hydrocephalus, extra-axial collection or mass lesion/mass effect. Age related global parenchymal volume loss with ex vacuo dilatation of ventricular system. Stable burden of chronic microvascular disease ischemic disease. Encephalomalacia in left occipital lobe with ex vacuo dilatation of the occipital horn of the lateral ventricle, sequela of prior insult. Mineralization of bilateral basal ganglia. Posterior fossa arachnoid cyst. Vascular: No hyperdense vessel. Atherosclerotic calcifications of the internal carotid arteries at skull base. Skull: Hyperostosis interna.  Negative for fracture or focal lesion. Sinuses/Orbits: The visualized paranasal sinuses and mastoid air cells are predominantly clear. Other: None CT CERVICAL SPINE FINDINGS Alignment: Preservation of the normal cervical lordosis. Grade 1 degenerative anterolisthesis of C3 on  C4 and C6 on C7. No evidence of traumatic listhesis. Skull base and vertebrae: No acute fracture. No primary bone lesion or focal pathologic process. Soft tissues and spinal canal: No prevertebral fluid or swelling. No visible canal hematoma. Disc levels: Lower cervical predominant multilevel degenerative change of the cervical spine with disc space narrowing osteophytosis and uncovertebral/facet hypertrophy. Upper chest: Small left greater than right layering pleural effusions with adjacent compressive atelectasis. Other: None IMPRESSION: 1. No evidence of acute intracranial pathology. 2. No evidence of acute fracture or traumatic listhesis of the cervical spine. 3. Lower cervical predominant multilevel degenerative change of the cervical spine. 4. Small left greater than right layering pleural effusions with adjacent compressive atelectasis. Electronically Signed   By: Dahlia Bailiff MD   On: 10/11/2020 21:54   DG Chest Port 1 View  Result Date: 10/11/2020 CLINICAL DATA:  Fall 2 hours ago  EXAM: PORTABLE CHEST 1 VIEW COMPARISON:  08/10/2020 FINDINGS: Mild cardiomegaly with suspected trace pleural effusions. Vascular congestion and mild diffuse interstitial process likely edema. Patchy atelectasis left base. Aortic atherosclerosis. No pneumothorax. IMPRESSION: 1. Cardiomegaly with probable small pleural effusions. Vascular congestion and mild interstitial pulmonary edema 2. Patchy atelectasis or pneumonia left base Electronically Signed   By: Donavan Foil M.D.   On: 10/11/2020 20:27   DG Hip Unilat With Pelvis 2-3 Views Left  Result Date: 10/11/2020 CLINICAL DATA:  Fall EXAM: DG HIP (WITH OR WITHOUT PELVIS) 2-3V LEFT COMPARISON:  CT 01/22/2019 FINDINGS: SI joints are non widened. Pubic symphysis and rami appear intact. Acute mildly displaced subcapital left femoral neck fracture. No femoral head dislocation IMPRESSION: Acute minimally displaced left femoral neck fracture Electronically Signed   By: Donavan Foil M.D.   On: 10/11/2020 20:28   _______________________________________________________________________________________________________ Latest  Blood pressure (!) 159/73, pulse 91, temperature 98.3 F (36.8 C), temperature source Oral, resp. rate (!) 26, height 5' 1"  (1.549 m), weight 74.7 kg, SpO2 97 %.   Review of Systems:    Pertinent positives include: , fatigue, Bilateral lower extremity swelling    Constitutional:  No weight loss, night sweats, Fevers, chills weight loss  HEENT:  No headaches, Difficulty swallowing,Tooth/dental problems,Sore throat,  No sneezing, itching, ear ache, nasal congestion, post nasal drip,  Cardio-vascular:  No chest pain, Orthopnea, PND, anasarca, dizziness, palpitations.no GI:  No heartburn, indigestion, abdominal pain, nausea, vomiting, diarrhea, change in bowel habits, loss of appetite, melena, blood in stool, hematemesis Resp:  no shortness of breath at rest. No dyspnea on exertion, No excess mucus, no productive cough, No  non-productive cough, No coughing up of blood.No change in color of mucus.No wheezing. Skin:  no rash or lesions. No jaundice GU:  no dysuria, change in color of urine, no urgency or frequency. No straining to urinate.  No flank pain.  Musculoskeletal:  No joint pain or no joint swelling. No decreased range of motion. No back pain.  Psych:  No change in mood or affect. No depression or anxiety. No memory loss.  Neuro: no localizing neurological complaints, no tingling, no weakness, no double vision, no gait abnormality, no slurred speech, no confusion  All systems reviewed and apart from [place] all are negative _______________________________________________________________________________________________ Past Medical History:   Past Medical History:  Diagnosis Date  . Anemia   . Basal cell carcinoma     on nose/face  . CAD (coronary artery disease)    a. 08/2018: posterior STEMI s/p DES to LCx x2  . Depression   . ESRD on peritoneal dialysis (  [place])    "q night" (11/29/2017)  . Fatty liver   . Hypertension   . Hypothyroidism   . NASH (nonalcoholic steatohepatitis)   . Seizures ([place])    ? when she may have had a stroke sometime in the spring of 2018  . Sleep apnea    diagnosed years ago "after I'd gained alot of weight;  no longer a problem since weight loss"  . Stroke [facility]) 04/2016   memory issues, lost 1/2 vision in both eyes  . Thrombocytopenia ([place])   . Thyroid disease   . Type II diabetes mellitus ([place])       Past Surgical History:  Procedure Laterality Date  . ABDOMINAL HYSTERECTOMY    . AV FISTULA PLACEMENT Right 10/30/2016   Procedure: INSERTION OF ARTERIOVENOUS (AV) GORE-TEX GRAFT RIGHT UPPER ARM;  Surgeon: Elam Dutch, MD;  Location: [facility];  Service: Vascular;  Laterality: Right;  . CATARACT EXTRACTION W/ INTRAOCULAR LENS  IMPLANT, BILATERAL Bilateral   . CHOLECYSTECTOMY OPEN    . CORNEAL TRANSPLANT Bilateral   . CORONARY ANGIOGRAPHY N/A 09/06/2018    Procedure: CORONARY ANGIOGRAPHY;  Surgeon: Lorretta Harp, MD;  Location: [facility];  Service: Cardiovascular;  Laterality: N/A;  . CORONARY/GRAFT ACUTE MI REVASCULARIZATION N/A 09/06/2018   Procedure: Coronary/Graft Acute MI Revascularization;  Surgeon: Lorretta Harp, MD;  Location: [facility];  Service: Cardiovascular;  Laterality: N/A;  . EYE SURGERY    . KNEE ARTHROSCOPY Left   . LEFT HEART CATH AND CORONARY ANGIOGRAPHY N/A 09/06/2018   Procedure: LEFT HEART CATH AND CORONARY ANGIOGRAPHY;  Surgeon: Lorretta Harp, MD;  Location: [facility];  Service: Cardiovascular;  Laterality: N/A;  . MOHS SURGERY     "nose"  . SHOULDER OPEN ROTATOR CUFF REPAIR Left   . THROMBECTOMY AND REVISION OF ARTERIOVENTOUS (AV) GORETEX  GRAFT Right 12/04/2016   Procedure: THROMBECTOMY/ REVISION OF RIGHT UPPER ARM ARTERIOVENOUS GORETEX GRAFT;  Surgeon: Elam Dutch, MD;  Location: [facility];  Service: Vascular;  Laterality: Right;  . TONSILLECTOMY      Social History:  Ambulatory  Gilford Rile      reports that she has never smoked. She has quit using smokeless tobacco.  Her smokeless tobacco use included snuff. She reports that she does not drink alcohol and does not use drugs.   Family History:   Family History  Problem Relation Age of Onset  . Tuberculosis Mother    ______________________________________________________________________________________________ Allergies: Allergies  Allergen Reactions  . Contrast Media [Iodinated Diagnostic Agents] Swelling    SWELLING REACTION UNSPECIFIED   . Tape Other (See Comments) and Dermatitis    Tears skin - please use paper tape Paper tape only     Prior to Admission medications   Medication Sig Start Date End Date Taking? Authorizing Provider  acetaminophen-codeine (TYLENOL #3) 300-30 MG tablet Take 1-2 tablets by mouth every 6 (six) hours as needed for moderate pain.   Yes [provider]  Amino Acids-Protein  Hydrolys (FEEDING SUPPLEMENT, PRO-STAT SUGAR FREE 64,) LIQD Take 30 mLs by mouth 2 (two) times daily. 03/16/17  Yes Patrecia Pour, Christean Grief, MD  amLODipine (NORVASC) 5 MG tablet TAKE 1 TABLET BY MOUTH EVERY DAY Patient taking differently: Take 5 mg by mouth at bedtime. 08/13/20  Yes Troy Sine, MD  atorvastatin (LIPITOR) 40 MG tablet TAKE 1 TABLET (40 MG TOTAL) BY MOUTH DAILY AT 6 PM. 01/13/20 03/20/20 Yes Troy Sine, MD  carvedilol (COREG) 3.125 MG  tablet Take 1 tablet (3.125 mg total) by mouth 2 (two) times daily with a meal. 09/20/20 10/20/20 Yes Troy Sine, MD  cholecalciferol (VITAMIN D3) 25 MCG (1000 UNIT) tablet Take 2,000 Units by mouth daily with breakfast.   Yes [provider]  citalopram (CELEXA) 10 MG tablet Take 10 mg by mouth daily with breakfast.  10/15/17  Yes [provider]  clopidogrel (PLAVIX) 75 MG tablet TAKE 1 TABLET (75 MG TOTAL) BY MOUTH DAILY WITH BREAKFAST. 11/04/19  Yes Troy Sine, MD  docusate sodium (COLACE) 100 MG capsule Take 100 mg by mouth at bedtime.   Yes [provider]  glipiZIDE (GLUCOTROL) 5 MG tablet Take 5 mg by mouth daily before breakfast. 06/25/18  Yes [provider]  LANTUS SOLOSTAR 100 UNIT/ML Solostar Pen Inject 14 Units into the skin at bedtime. 05/10/20  Yes [provider]  levETIRAcetam (KEPPRA) 250 MG tablet Take 250 mg by mouth 2 (two) times daily with a meal.    Yes [provider]  levothyroxine (SYNTHROID, LEVOTHROID) 100 MCG tablet Take 100 mcg by mouth daily before breakfast.  09/02/16  Yes [provider]  linagliptin (TRADJENTA) 5 MG TABS tablet Take 5 mg by mouth daily with breakfast.    Yes [provider]  lipase/protease/amylase (CREON) 12000-38000 units CPEP capsule Take 48,000 Units by mouth 3 (three) times daily with meals.   Yes [provider]  Methoxy PEG-Epoetin Beta (MIRCERA IJ) Inject into the skin. 03/09/20  Yes [provider]   multivitamin (PROSIGHT) TABS tablet Take 1 tablet by mouth daily. Patient taking differently: Take 1 tablet by mouth daily with breakfast. 03/17/17  Yes Patrecia Pour, Christean Grief, MD  nitroGLYCERIN (NITROSTAT) 0.4 MG SL tablet Place 0.4 mg under the tongue every 5 (five) minutes as needed for chest pain.    Yes [provider]  Nutritional Supplements (FEEDING SUPPLEMENT, NEPRO CARB STEADY,) LIQD Take 237 mLs by mouth daily. Patient taking differently: Take 237 mLs by mouth daily with lunch. 03/17/17  Yes Patrecia Pour, Christean Grief, MD  pantoprazole (PROTONIX) 40 MG tablet Take 40 mg by mouth daily with breakfast.  08/21/18  Yes [provider]  pioglitazone (ACTOS) 15 MG tablet Take 15 mg by mouth daily with breakfast. 07/10/20  Yes [provider]  polyethylene glycol (MIRALAX / GLYCOLAX) packet Take 8.5-17 g by mouth daily.   Yes [provider]  potassium chloride SA (KLOR-CON) 20 MEQ tablet Take 20 mEq by mouth daily with breakfast.   Yes [provider]  prednisoLONE acetate (PRED FORTE) 1 % ophthalmic suspension Place 1 drop into both eyes at bedtime.    Yes [provider]  sevelamer carbonate (RENVELA) 800 MG tablet Take 800 mg by mouth 3 (three) times daily with meals.  07/04/18  Yes [provider]  traZODone (DESYREL) 50 MG tablet Take 50 mg by mouth at bedtime.   Yes [provider]    ___________________________________________________________________________________________________ Physical Exam: Vitals with BMI 10/11/2020 10/11/2020 10/11/2020  Height - - -  Weight - - -  BMI - - -  Systolic 496 759 163  Diastolic 73 66 62  Pulse 91 88 89     1. General:  in No  Acute distress    Chronically ill -appearing 2. Psychological: Alert and Oriented 3. Head/ENT:    Moist  Mucous Membranes  Head Non traumatic, neck supple                            Poor Dentition 4. SKIN: normal   Skin turgor,  Skin  clean Dry and intact no rash 5. Heart: Regular rate and rhythm no   Murmur, no Rub or gallop 6. Lungs:  no wheezes or crackles   7. Abdomen: Soft,  non-tender, Non distended bowel sounds present 8. Lower extremities: no clubbing, cyanosis, 2+ edema anasarca 9. Neurologically Grossly intact, moving all 4 extremities equally   10. MSK: Normal range of motion    Chart has been reviewed  ______________________________________________________________________________________________  Assessment/Plan  80 y.o. female with medical history significant of  CAD, ESRD on PD, HTN, DM2, on O2 as needed history of CVA hypothyroidism NASH questional history of seizures in the past, sleep apnea,     Admitted for LEft hip fracture  Present on Admission: . Closed left hip fracture ([place]) -  - management as per orthopedics,  plan to operate   in  a.m.    Keep nothing by mouth post midnight except sips with medications. Patient on  antiplatelet agents  on hold Ordered type and screen,   order a vitamin D level  Patient at baseline unable to walk a flight of stairs or 100 feet   due to  generalize fatigue and deconditioning  .   Patient denies any chest pain or shortness of breath currently and/or with exertion,    ECG showing no evidence of acute ischemia  known history of coronary artery disease,  Liver failure end-stage renal disease  Given advanced age multiple medical problems patient is at least moderate to high risk risk   which has been discussed with family but at this point no furthther cardiac workup is indicated.  Had recent echo will not repeat 1 set of CE   Cardiology consult for further pre-op clearance given extensive cardiac disease Nephrology consulted to help with peritoneal dialysis Would calculate Pugh score once rest of the labs come back  . 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 NASH (nonalcoholic steatohepatitis) -chronic obtain LFTs.  INR below 1.5 no evidence of hepatic encephalopathy  . Hypothyroidism- - Check TSH continue home medications at current dose  . Essential hypertension -chronic stable continue home medications when able to tolerate    . Dyslipidemia, goal LDL below 70   -Will resume home medications when able to tolerate   . DM (diabetes mellitus), type 2 with renal complications (HCC)-  - Order Sensitive  SSI   -decrease Lantus 5 units, while NPO  -  check TSH and HgA1C  - Hold by mouth medications    . 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  DVT prophylaxis:  SCD      Code Status:    Code Status: Prior  DNR/DNI  as per patient   I had personally discussed CODE STATUS with patient     Family Communication:   Family not at  Bedside   Disposition Plan:   likely will need placement for rehabilitation  Following barriers for discharge:                            Electrolytes corrected                                                            Pain controlled with PO medications                                                          Will need to be able to tolerate PO                                                    Will need consultants to evaluate patient prior to discharge                    Would benefit from PT/OT eval prior to DC                     Swallow eval - SLP ordered                      Transition of care consulted                   Nutrition    consulted                                     Palliative care    consulted                                     Consults called: orthopedics, Cardiology emailed, Nephrology    Admission status:  ED Disposition    ED Disposition Condition [place]: [place] [[id number]]  Level of Care: Telemetry Medical [[id number]]  May admit patient to [facility] or [facility] if equivalent level of care is available:: No  Covid Evaluation: Confirmed COVID Negative  Diagnosis: Closed left hip fracture [facility]) [[id number]]  Admitting Physician: Toy Baker [[id number]]  Attending Physician: Toy Baker [[id number]]  Estimated length of stay: past midnight tomorrow  Certification:: I certify this patient will need inpatient services for at least 2 midnights        inpatient     I Expect 2 midnight stay secondary to severity of patient's current illness need for inpatient interventions justified by the following:     Severe lab/radiological/exam abnormalities including:  Left hip frx   and extensive comorbidities including:   DM2    CAD  Morbid Obesity ESRD   liver disease   That are currently affecting medical management.   I expect  patient to be hospitalized for 2 midnights requiring inpatient medical care.  Patient is at high  risk for adverse outcome (such as loss of life or disability) if not treated.  Indication for inpatient stay as follows:    severe pain requiring acute inpatient management,  inability to maintain oral hydration    Need for operative/procedural  intervention    Need for   IV pain medications     Level of care     tele  For  24H    Lab Results  Component Value Date   [place] NEGATIVE 10/11/2020     Precautions: admitted as  Covid Negative    PPE: Used by the provider:   N95  eye Goggles,  Gloves     Cortni Tays 10/12/2020, 12:08 AM    [facility]     after 2 AM please page floor coverage PA If 7AM-7PM, please contact the day team taking care of the patient using [email or web address]   Patient was evaluated in the context of the global COVID-19 pandemic, which necessitated  consideration that the patient might be at risk for infection with the SARS-CoV-2 virus that causes COVID-19. Institutional protocols and algorithms that pertain to the evaluation of patients at risk for COVID-19 are in a state of rapid change based on information released by regulatory bodies including the CDC and federal and state organizations. These policies and algorithms were followed during the patient's care.

## 2020-10-11 NOTE — ED Provider Notes (Addendum)
[facility] EMERGENCY DEPARTMENT Provider Note   CSN: [id number] Arrival date & time: 10/11/20  1912     History Chief Complaint  Patient presents with  . Fall    Samantha Conrad is a 80 y.o. female presenting for evaluation of left hip pain after fall.  Patient states she has had weakness ever since her stroke 2 months ago.  She was standing, holding onto something, when she lost her balance and fell backwards.  She did not hit her head or lose consciousness.  She reports severe left hip pain since the fall.  She is not been able to walk since.  She does not know if she is on blood thinners.  She denies neck or back pain.  She walks with a walker, but reports gradually worsening leg weakness since her stroke.   Additional history obtained from chart review.  History of anemia, CAD, ESRD on peritoneal dialysis, hypertension, hypothyroidism, [place], sleep apnea, stroke, diabetes.   HPI     Past Medical History:  Diagnosis Date  . Anemia   . Basal cell carcinoma     on nose/face  . CAD (coronary artery disease)    a. 08/2018: posterior STEMI s/p DES to LCx x2  . Depression   . ESRD on peritoneal dialysis ([place])    "q night" (11/29/2017)  . Fatty liver   . Hypertension   . Hypothyroidism   . NASH (nonalcoholic steatohepatitis)   . Seizures ([place])    ? when she may have had a stroke sometime in the spring of 2018  . Sleep apnea    diagnosed years ago "after I'd gained alot of weight;  no longer a problem since weight loss"  . Stroke [facility]) 04/2016   memory issues, lost 1/2 vision in both eyes  . Thrombocytopenia ([place])   . Thyroid disease   . Type II diabetes mellitus [facility])     Patient Active Problem List   Diagnosis Date Noted  . Closed left hip fracture ([place]) 10/11/2020  . Goals of care, counseling/discussion   . Palliative care by specialist   . Shortness of breath   . NSTEMI (non-ST elevated myocardial infarction) ([place]) 08/11/2020  . CAP (community  acquired pneumonia) 08/11/2020  . DM (diabetes mellitus), type 2 with renal complications ([place]) 74/94/4967  . PNA (pneumonia) 03/19/2020  . Peritonitis ([place])   . Spontaneous bacterial peritonitis ([place]) 09/13/2018  . CAD S/P percutaneous coronary angioplasty   . Anemia   . Thrombocytopenia ([place])   . History of ST elevation myocardial infarction (STEMI) 09/06/2018  . Symptomatic anemia 03/11/2017  . Diet-controlled diabetes mellitus ([place]) 03/11/2017  . NASH (nonalcoholic steatohepatitis) 03/11/2017  . Essential hypertension 03/11/2017  . Hypothyroidism 03/11/2017  . ESRD (end stage renal disease) on dialysis ([place]) 03/11/2017  . Seizure disorder 03/11/2017  . Abdominal pain 03/11/2017  . Dyslipidemia, goal LDL below 70 03/11/2017  . Chronic pancreatitis ([place]) 03/11/2017  . Constipation 03/11/2017  . Laryngopharyngeal reflux (LPR) 02/17/2015  . H/O Mild allergic rhinitis 02/17/2015  . Cirrhosis ([place]) 02/17/2015    Past Surgical History:  Procedure Laterality Date  . ABDOMINAL HYSTERECTOMY    . AV FISTULA PLACEMENT Right 10/30/2016   Procedure: INSERTION OF ARTERIOVENOUS (AV) GORE-TEX GRAFT RIGHT UPPER ARM;  Surgeon: Elam Dutch, MD;  Location: [place];  Service: Vascular;  Laterality: Right;  . CATARACT EXTRACTION W/ INTRAOCULAR LENS  IMPLANT, BILATERAL Bilateral   . CHOLECYSTECTOMY OPEN    . CORNEAL TRANSPLANT Bilateral   .  CORONARY ANGIOGRAPHY N/A 09/06/2018   Procedure: CORONARY ANGIOGRAPHY;  Surgeon: Lorretta Harp, MD;  Location: [place] CV LAB;  Service: Cardiovascular;  Laterality: N/A;  . CORONARY/GRAFT ACUTE MI REVASCULARIZATION N/A 09/06/2018   Procedure: Coronary/Graft Acute MI Revascularization;  Surgeon: Lorretta Harp, MD;  Location: [place] CV LAB;  Service: Cardiovascular;  Laterality: N/A;  . EYE SURGERY    . KNEE ARTHROSCOPY Left   . LEFT HEART CATH AND CORONARY ANGIOGRAPHY N/A 09/06/2018   Procedure: LEFT HEART CATH AND CORONARY ANGIOGRAPHY;   Surgeon: Lorretta Harp, MD;  Location: [place] CV LAB;  Service: Cardiovascular;  Laterality: N/A;  . MOHS SURGERY     "nose"  . SHOULDER OPEN ROTATOR CUFF REPAIR Left   . THROMBECTOMY AND REVISION OF ARTERIOVENTOUS (AV) GORETEX  GRAFT Right 12/04/2016   Procedure: THROMBECTOMY/ REVISION OF RIGHT UPPER ARM ARTERIOVENOUS GORETEX GRAFT;  Surgeon: Elam Dutch, MD;  Location: [place];  Service: Vascular;  Laterality: Right;  . TONSILLECTOMY       OB History   No obstetric history on file.     Family History  Problem Relation Age of Onset  . Tuberculosis Mother     Social History   Tobacco Use  . Smoking status: Never Smoker  . Smokeless tobacco: Former Systems developer    Types: Snuff  . Tobacco comment: "no snuff since < 1980s"  Vaping Use  . Vaping Use: Never used  Substance Use Topics  . Alcohol use: Never  . Drug use: Never    Home Medications Prior to Admission medications   Medication Sig Start Date End Date Taking? Authorizing Provider  acetaminophen-codeine (TYLENOL #3) 300-30 MG tablet Take 1-2 tablets by mouth every 6 (six) hours as needed for moderate pain.   Yes [provider]  Amino Acids-Protein Hydrolys (FEEDING SUPPLEMENT, PRO-STAT SUGAR FREE 64,) LIQD Take 30 mLs by mouth 2 (two) times daily. 03/16/17  Yes Patrecia Pour, Christean Grief, MD  amLODipine (NORVASC) 5 MG tablet TAKE 1 TABLET BY MOUTH EVERY DAY Patient taking differently: Take 5 mg by mouth at bedtime. 08/13/20  Yes Troy Sine, MD  atorvastatin (LIPITOR) 40 MG tablet TAKE 1 TABLET (40 MG TOTAL) BY MOUTH DAILY AT 6 PM. 01/13/20 03/20/20 Yes Troy Sine, MD  carvedilol (COREG) 3.125 MG tablet Take 1 tablet (3.125 mg total) by mouth 2 (two) times daily with a meal. 09/20/20 10/20/20 Yes Troy Sine, MD  cholecalciferol (VITAMIN D3) 25 MCG (1000 UNIT) tablet Take 2,000 Units by mouth daily with breakfast.   Yes [provider]  citalopram (CELEXA) 10 MG tablet Take 10 mg by mouth daily  with breakfast.  10/15/17  Yes [provider]  clopidogrel (PLAVIX) 75 MG tablet TAKE 1 TABLET (75 MG TOTAL) BY MOUTH DAILY WITH BREAKFAST. 11/04/19  Yes Troy Sine, MD  docusate sodium (COLACE) 100 MG capsule Take 100 mg by mouth at bedtime.   Yes [provider]  glipiZIDE (GLUCOTROL) 5 MG tablet Take 5 mg by mouth daily before breakfast. 06/25/18  Yes [provider]  LANTUS SOLOSTAR 100 UNIT/ML Solostar Pen Inject 14 Units into the skin at bedtime. 05/10/20  Yes [provider]  levETIRAcetam (KEPPRA) 250 MG tablet Take 250 mg by mouth 2 (two) times daily with a meal.    Yes [provider]  levothyroxine (SYNTHROID, LEVOTHROID) 100 MCG tablet Take 100 mcg by mouth daily before breakfast.  09/02/16  Yes [provider]  linagliptin (TRADJENTA) 5  MG TABS tablet Take 5 mg by mouth daily with breakfast.    Yes [provider]  lipase/protease/amylase (CREON) 12000-38000 units CPEP capsule Take 48,000 Units by mouth 3 (three) times daily with meals.   Yes [provider]  Methoxy PEG-Epoetin Beta (MIRCERA IJ) Inject into the skin. 03/09/20  Yes [provider]  multivitamin (PROSIGHT) TABS tablet Take 1 tablet by mouth daily. Patient taking differently: Take 1 tablet by mouth daily with breakfast. 03/17/17  Yes Patrecia Pour, Christean Grief, MD  nitroGLYCERIN (NITROSTAT) 0.4 MG SL tablet Place 0.4 mg under the tongue every 5 (five) minutes as needed for chest pain.    Yes [provider]  Nutritional Supplements (FEEDING SUPPLEMENT, NEPRO CARB STEADY,) LIQD Take 237 mLs by mouth daily. Patient taking differently: Take 237 mLs by mouth daily with lunch. 03/17/17  Yes Patrecia Pour, Christean Grief, MD  pantoprazole (PROTONIX) 40 MG tablet Take 40 mg by mouth daily with breakfast.  08/21/18  Yes [provider]  pioglitazone (ACTOS) 15 MG tablet Take 15 mg by mouth daily with breakfast. 07/10/20  Yes [provider]   polyethylene glycol (MIRALAX / GLYCOLAX) packet Take 8.5-17 g by mouth daily.   Yes [provider]  potassium chloride SA (KLOR-CON) 20 MEQ tablet Take 20 mEq by mouth daily with breakfast.   Yes [provider]  prednisoLONE acetate (PRED FORTE) 1 % ophthalmic suspension Place 1 drop into both eyes at bedtime.    Yes [provider]  sevelamer carbonate (RENVELA) 800 MG tablet Take 800 mg by mouth 3 (three) times daily with meals.  07/04/18  Yes [provider]  traZODone (DESYREL) 50 MG tablet Take 50 mg by mouth at bedtime.   Yes [provider]    Allergies    Contrast media [iodinated diagnostic agents] and Tape  Review of Systems   Review of Systems  Musculoskeletal: Positive for arthralgias.  Neurological: Positive for weakness.  All other systems reviewed and are negative.   Physical Exam Updated Vital Signs BP (!) 159/73   Pulse 91   Temp 98.3 F (36.8 C) (Oral)   Resp (!) 26   Ht 5' 1"  (1.549 m)   Wt 74.7 kg   SpO2 97%   BMI 31.12 kg/m   Physical Exam Vitals and nursing note reviewed.  Constitutional:      General: She is not in acute distress.    Appearance: She is well-developed.     Comments: Appears nontoxic, but uncomfortable due to pain.   HENT:     Head: Normocephalic and atraumatic.  Eyes:     Extraocular Movements: Extraocular movements intact.     Conjunctiva/sclera: Conjunctivae normal.     Pupils: Pupils are equal, round, and reactive to light.  Neck:     Comments: No ttp of midline c-spine Cardiovascular:     Rate and Rhythm: Normal rate and regular rhythm.     Pulses: Normal pulses.  Pulmonary:     Effort: Pulmonary effort is normal. No respiratory distress.     Breath sounds: Normal breath sounds. No wheezing.  Abdominal:     General: There is no distension.     Palpations: Abdomen is soft. There is no mass.     Tenderness: There is no abdominal tenderness. There is no guarding or rebound.   Musculoskeletal:        General: Tenderness present. Normal range of motion.     Cervical back: Normal range of motion and neck supple.  Comments: ttp of the L hip. Leg is shortened and externally rotated. Pedal pulses 2+. Good cap refill. No ttp of the R hip  Skin:    General: Skin is warm and dry.     Capillary Refill: Capillary refill takes less than 2 seconds.  Neurological:     Mental Status: She is alert and oriented to person, place, and time.     ED Results / Procedures / Treatments   Labs (all labs ordered are listed, but only abnormal results are displayed) Labs Reviewed  BASIC METABOLIC PANEL - Abnormal; Notable for the following components:      Result Value   Chloride 96 (*)    Glucose, Bld 104 (*)    BUN 51 (*)    Creatinine, Ser 6.13 (*)    Calcium 8.8 (*)    GFR, Estimated 6 (*)    All other components within normal limits  CBC WITH DIFFERENTIAL/PLATELET - Abnormal; Notable for the following components:   RBC 3.30 (*)    Hemoglobin 9.4 (*)    HCT 32.3 (*)    MCHC 29.1 (*)    RDW 18.0 (*)    Platelets 82 (*)    All other components within normal limits  RESP PANEL BY RT-PCR (FLU A&B, COVID) ARPGX2  PROTIME-INR  URINALYSIS, ROUTINE W REFLEX MICROSCOPIC  TYPE AND SCREEN    EKG EKG Interpretation  Date/Time:  Monday Oct 11 2020 20:07:34 EDT Ventricular Rate:  89 PR Interval:    QRS Duration: 97 QT Interval:  392 QTC Calculation: 477 R Axis:   31 Text Interpretation: Atrial fibrillation Low voltage, precordial leads Artifact, appears NSR Confirmed by Lavenia Atlas [phone]) on 10/11/2020 8:22:07 PM   Radiology CT HEAD WO CONTRAST  Result Date: 10/11/2020 CLINICAL DATA:  Fall with head trauma EXAM: CT HEAD WITHOUT CONTRAST CT CERVICAL SPINE WITHOUT CONTRAST TECHNIQUE: Multidetector CT imaging of the head and cervical spine was performed following the standard protocol without intravenous contrast. Multiplanar CT image reconstructions of the cervical  spine were also generated. COMPARISON:  August 10, 2020 FINDINGS: CT HEAD FINDINGS Brain: No evidence of acute large vascular territory infarction, hemorrhage, hydrocephalus, extra-axial collection or mass lesion/mass effect. Age related global parenchymal volume loss with ex vacuo dilatation of ventricular system. Stable burden of chronic microvascular disease ischemic disease. Encephalomalacia in left occipital lobe with ex vacuo dilatation of the occipital horn of the lateral ventricle, sequela of prior insult. Mineralization of bilateral basal ganglia. Posterior fossa arachnoid cyst. Vascular: No hyperdense vessel. Atherosclerotic calcifications of the internal carotid arteries at skull base. Skull: Hyperostosis interna.  Negative for fracture or focal lesion. Sinuses/Orbits: The visualized paranasal sinuses and mastoid air cells are predominantly clear. Other: None CT CERVICAL SPINE FINDINGS Alignment: Preservation of the normal cervical lordosis. Grade 1 degenerative anterolisthesis of C3 on C4 and C6 on C7. No evidence of traumatic listhesis. Skull base and vertebrae: No acute fracture. No primary bone lesion or focal pathologic process. Soft tissues and spinal canal: No prevertebral fluid or swelling. No visible canal hematoma. Disc levels: Lower cervical predominant multilevel degenerative change of the cervical spine with disc space narrowing osteophytosis and uncovertebral/facet hypertrophy. Upper chest: Small left greater than right layering pleural effusions with adjacent compressive atelectasis. Other: None IMPRESSION: 1. No evidence of acute intracranial pathology. 2. No evidence of acute fracture or traumatic listhesis of the cervical spine. 3. Lower cervical predominant multilevel degenerative change of the cervical spine. 4. Small left greater than right layering pleural effusions  with adjacent compressive atelectasis. Electronically Signed   By: Dahlia Bailiff MD   On: 10/11/2020 21:54   CT  CERVICAL SPINE WO CONTRAST  Result Date: 10/11/2020 CLINICAL DATA:  Fall with head trauma EXAM: CT HEAD WITHOUT CONTRAST CT CERVICAL SPINE WITHOUT CONTRAST TECHNIQUE: Multidetector CT imaging of the head and cervical spine was performed following the standard protocol without intravenous contrast. Multiplanar CT image reconstructions of the cervical spine were also generated. COMPARISON:  August 10, 2020 FINDINGS: CT HEAD FINDINGS Brain: No evidence of acute large vascular territory infarction, hemorrhage, hydrocephalus, extra-axial collection or mass lesion/mass effect. Age related global parenchymal volume loss with ex vacuo dilatation of ventricular system. Stable burden of chronic microvascular disease ischemic disease. Encephalomalacia in left occipital lobe with ex vacuo dilatation of the occipital horn of the lateral ventricle, sequela of prior insult. Mineralization of bilateral basal ganglia. Posterior fossa arachnoid cyst. Vascular: No hyperdense vessel. Atherosclerotic calcifications of the internal carotid arteries at skull base. Skull: Hyperostosis interna.  Negative for fracture or focal lesion. Sinuses/Orbits: The visualized paranasal sinuses and mastoid air cells are predominantly clear. Other: None CT CERVICAL SPINE FINDINGS Alignment: Preservation of the normal cervical lordosis. Grade 1 degenerative anterolisthesis of C3 on C4 and C6 on C7. No evidence of traumatic listhesis. Skull base and vertebrae: No acute fracture. No primary bone lesion or focal pathologic process. Soft tissues and spinal canal: No prevertebral fluid or swelling. No visible canal hematoma. Disc levels: Lower cervical predominant multilevel degenerative change of the cervical spine with disc space narrowing osteophytosis and uncovertebral/facet hypertrophy. Upper chest: Small left greater than right layering pleural effusions with adjacent compressive atelectasis. Other: None IMPRESSION: 1. No evidence of acute intracranial  pathology. 2. No evidence of acute fracture or traumatic listhesis of the cervical spine. 3. Lower cervical predominant multilevel degenerative change of the cervical spine. 4. Small left greater than right layering pleural effusions with adjacent compressive atelectasis. Electronically Signed   By: Dahlia Bailiff MD   On: 10/11/2020 21:54   DG Chest Port 1 View  Result Date: 10/11/2020 CLINICAL DATA:  Fall 2 hours ago EXAM: PORTABLE CHEST 1 VIEW COMPARISON:  08/10/2020 FINDINGS: Mild cardiomegaly with suspected trace pleural effusions. Vascular congestion and mild diffuse interstitial process likely edema. Patchy atelectasis left base. Aortic atherosclerosis. No pneumothorax. IMPRESSION: 1. Cardiomegaly with probable small pleural effusions. Vascular congestion and mild interstitial pulmonary edema 2. Patchy atelectasis or pneumonia left base Electronically Signed   By: Donavan Foil M.D.   On: 10/11/2020 20:27   DG Hip Unilat With Pelvis 2-3 Views Left  Result Date: 10/11/2020 CLINICAL DATA:  Fall EXAM: DG HIP (WITH OR WITHOUT PELVIS) 2-3V LEFT COMPARISON:  CT 01/22/2019 FINDINGS: SI joints are non widened. Pubic symphysis and rami appear intact. Acute mildly displaced subcapital left femoral neck fracture. No femoral head dislocation IMPRESSION: Acute minimally displaced left femoral neck fracture Electronically Signed   By: Donavan Foil M.D.   On: 10/11/2020 20:28    Procedures Procedures   Medications Ordered in ED Medications  morphine 4 MG/ML injection 4 mg (has no administration in time range)  fentaNYL (SUBLIMAZE) injection 50 mcg (50 mcg Intravenous Given 10/11/20 2045)    ED Course  I have reviewed the triage vital signs and the nursing notes.  Pertinent labs & imaging results that were available during my care of the patient were reviewed by me and considered in my medical decision making (see chart for details).    MDM Rules/Calculators/A&P  Patient  presenting for evaluation of left hip pain after fall.  On exam, patient appears nontoxic, though she does appear uncomfortable due to pain.  She does have tenderness palpation of the left hip.  Her left leg is shortened and externally rotated.  She does have a good pedal pulse and good cap refill.  No obvious injury elsewhere.  No tenderness palpation the abdomen.  Clear lung sounds.  Will obtain x-ray imaging as I have concern for possible hip fracture.  Will obtain CT head and neck to ensure no injury elsewhere.  Pelvis x-ray viewed and independently interpreted by me, shows hip fracture at the femoral neck.  Chest x-ray negative for acute findings.  Labs overall reassuring.  CT head and neck negative for acute findings.  Discussed with Dr. Doreatha Martin from orthopedics who recommends patient be n.p.o. tonight.  Discussed with Dr. Roel Cluck from [facility], patient to be admitted.   Discussed with Dr. Hollie Salk from nephrology who will consult while pt is admitted.   Final Clinical Impression(s) / ED Diagnoses Final diagnoses:  Closed fracture of left hip, initial encounter [facility])  Fall, initial encounter  Subconjunctival hemorrhage of right eye    Rx / DC Orders ED Discharge Orders    None       Franchot Heidelberg, PA-C 10/11/20 2232    Franchot Heidelberg, PA-C 10/11/20 2317    Horton, Alvin Critchley, DO 10/12/20 2348

## 2020-10-11 NOTE — ED Notes (Signed)
Patient returned from CT, resting w/ NAD noted. VSS. Bed low and locked. Side rails up x2.

## 2020-10-11 NOTE — ED Notes (Signed)
Patient transported to CT 

## 2020-10-11 NOTE — ED Triage Notes (Signed)
Arrives complaining of falling x 2 hours ago. Complaining of L hip pain w/ outward rotation. Also complaining of L shoulder pain. Peritoneal dialysis pt.   EMS Vitals 150/78 85 HR 97% 2L [place] wears @ PRN

## 2020-10-12 ENCOUNTER — Inpatient Hospital Stay (HOSPITAL_COMMUNITY)

## 2020-10-12 ENCOUNTER — Inpatient Hospital Stay (HOSPITAL_COMMUNITY): Admitting: Certified Registered Nurse Anesthetist

## 2020-10-12 ENCOUNTER — Encounter (HOSPITAL_COMMUNITY): Payer: Self-pay | Admitting: Internal Medicine

## 2020-10-12 ENCOUNTER — Other Ambulatory Visit: Payer: Self-pay

## 2020-10-12 ENCOUNTER — Encounter (HOSPITAL_COMMUNITY)
Admission: EM | Disposition: A | Discharge status: Hospice | Payer: Self-pay | Source: Home / Self Care | Attending: Internal Medicine

## 2020-10-12 DIAGNOSIS — Z992 Dependence on renal dialysis: Secondary | ICD-10-CM

## 2020-10-12 DIAGNOSIS — Z419 Encounter for procedure for purposes other than remedying health state, unspecified: Secondary | ICD-10-CM

## 2020-10-12 DIAGNOSIS — S72002S Fracture of unspecified part of neck of left femur, sequela: Secondary | ICD-10-CM

## 2020-10-12 DIAGNOSIS — Z7189 Other specified counseling: Secondary | ICD-10-CM

## 2020-10-12 DIAGNOSIS — N186 End stage renal disease: Secondary | ICD-10-CM | POA: Diagnosis not present

## 2020-10-12 DIAGNOSIS — Z515 Encounter for palliative care: Secondary | ICD-10-CM | POA: Diagnosis not present

## 2020-10-12 DIAGNOSIS — K7581 Nonalcoholic steatohepatitis (NASH): Secondary | ICD-10-CM

## 2020-10-12 HISTORY — PX: HIP PINNING,CANNULATED: SHX1758

## 2020-10-12 LAB — BASIC METABOLIC PANEL
Anion gap: 15 (ref 5–15)
BUN: 52 mg/dL — ABNORMAL HIGH (ref 8–23)
CO2: 21 mmol/L — ABNORMAL LOW (ref 22–32)
Calcium: 8.3 mg/dL — ABNORMAL LOW (ref 8.9–10.3)
Chloride: 100 mmol/L (ref 98–111)
Creatinine, Ser: 5.94 mg/dL — ABNORMAL HIGH (ref 0.44–1.00)
GFR, Estimated: 7 mL/min — ABNORMAL LOW (ref >60)
Glucose, Bld: 144 mg/dL — ABNORMAL HIGH (ref 70–99)
Potassium: 4 mmol/L (ref 3.5–5.1)
Sodium: 136 mmol/L (ref 135–145)

## 2020-10-12 LAB — TROPONIN I (HIGH SENSITIVITY): Troponin I (High Sensitivity): 69 ng/L — ABNORMAL HIGH

## 2020-10-12 LAB — GLUCOSE, CAPILLARY
Glucose-Capillary: 114 mg/dL — ABNORMAL HIGH (ref 70–99)
Glucose-Capillary: 131 mg/dL — ABNORMAL HIGH (ref 70–99)
Glucose-Capillary: 138 mg/dL — ABNORMAL HIGH (ref 70–99)
Glucose-Capillary: 145 mg/dL — ABNORMAL HIGH (ref 70–99)
Glucose-Capillary: 176 mg/dL — ABNORMAL HIGH (ref 70–99)
Glucose-Capillary: 247 mg/dL — ABNORMAL HIGH (ref 70–99)
Glucose-Capillary: 329 mg/dL — ABNORMAL HIGH (ref 70–99)

## 2020-10-12 LAB — HEPATIC FUNCTION PANEL
ALT: 20 U/L (ref 0–44)
AST: 22 U/L (ref 15–41)
Albumin: 2.3 g/dL — ABNORMAL LOW (ref 3.5–5.0)
Alkaline Phosphatase: 112 U/L (ref 38–126)
Bilirubin, Direct: 0.1 mg/dL (ref 0.0–0.2)
Indirect Bilirubin: 0.4 mg/dL (ref 0.3–0.9)
Total Bilirubin: 0.5 mg/dL (ref 0.3–1.2)
Total Protein: 5.2 g/dL — ABNORMAL LOW (ref 6.5–8.1)

## 2020-10-12 LAB — CBC
HCT: 28.7 % — ABNORMAL LOW (ref 36.0–46.0)
Hemoglobin: 8.5 g/dL — ABNORMAL LOW (ref 12.0–15.0)
MCH: 29.1 pg (ref 26.0–34.0)
MCHC: 29.6 g/dL — ABNORMAL LOW (ref 30.0–36.0)
MCV: 98.3 fL (ref 80.0–100.0)
Platelets: 68 10*3/uL — ABNORMAL LOW (ref 150–400)
RBC: 2.92 MIL/uL — ABNORMAL LOW (ref 3.87–5.11)
RDW: 18.2 % — ABNORMAL HIGH (ref 11.5–15.5)
WBC: 7.1 10*3/uL (ref 4.0–10.5)
nRBC: 0 % (ref 0.0–0.2)

## 2020-10-12 LAB — HEMOGLOBIN A1C
Hgb A1c MFr Bld: 6.6 % — ABNORMAL HIGH (ref 4.8–5.6)
Mean Plasma Glucose: 142.72 mg/dL

## 2020-10-12 LAB — VITAMIN D 25 HYDROXY (VIT D DEFICIENCY, FRACTURES): Vit D, 25-Hydroxy: 30.28 ng/mL (ref 30–100)

## 2020-10-12 LAB — MRSA PCR SCREENING: MRSA by PCR: NEGATIVE

## 2020-10-12 LAB — PHOSPHORUS: Phosphorus: 3.2 mg/dL (ref 2.5–4.6)

## 2020-10-12 LAB — MAGNESIUM: Magnesium: 1.5 mg/dL — ABNORMAL LOW (ref 1.7–2.4)

## 2020-10-12 SURGERY — FIXATION, FEMUR, NECK, PERCUTANEOUS, USING SCREW
Anesthesia: General | Site: Hip | Laterality: Left

## 2020-10-12 MED ORDER — INSULIN ASPART 100 UNIT/ML IJ SOLN
0.0000 [IU] | Freq: Three times a day (TID) | INTRAMUSCULAR | Status: DC
  Administered 2020-10-13: 1 [IU] via SUBCUTANEOUS
  Administered 2020-10-13: 4 [IU] via SUBCUTANEOUS

## 2020-10-12 MED ORDER — ONDANSETRON HCL 4 MG/2ML IJ SOLN
INTRAMUSCULAR | Status: DC | PRN
  Administered 2020-10-12: 4 mg via INTRAVENOUS

## 2020-10-12 MED ORDER — SUGAMMADEX SODIUM 200 MG/2ML IV SOLN
INTRAVENOUS | Status: DC | PRN
  Administered 2020-10-12: 200 mg via INTRAVENOUS

## 2020-10-12 MED ORDER — ONDANSETRON HCL 4 MG/2ML IJ SOLN: 4.0000 mg | Freq: Once | INTRAMUSCULAR | Status: DC | PRN

## 2020-10-12 MED ORDER — ONDANSETRON HCL 4 MG PO TABS: 4.0000 mg | ORAL_TABLET | Freq: Four times a day (QID) | ORAL | Status: DC | PRN

## 2020-10-12 MED ORDER — ACETAMINOPHEN 10 MG/ML IV SOLN: 1000.0000 mg | Freq: Once | INTRAVENOUS | Status: DC | PRN

## 2020-10-12 MED ORDER — CLOPIDOGREL BISULFATE 75 MG PO TABS
75.0000 mg | ORAL_TABLET | Freq: Every day | ORAL | Status: DC
  Administered 2020-10-13: 75 mg via ORAL
  Filled 2020-10-12: qty 1

## 2020-10-12 MED ORDER — HEPARIN 1000 UNIT/ML FOR PERITONEAL DIALYSIS
INTRAPERITONEAL | Status: DC | PRN
  Filled 2020-10-12: qty 3000

## 2020-10-12 MED ORDER — PHENOL 1.4 % MT LIQD: 1.0000 | OROMUCOSAL | Status: DC | PRN

## 2020-10-12 MED ORDER — CHLORHEXIDINE GLUCONATE 4 % EX LIQD: 60.0000 mL | Freq: Once | CUTANEOUS | Status: DC

## 2020-10-12 MED ORDER — HEPARIN 1000 UNIT/ML FOR PERITONEAL DIALYSIS: 500.0000 [IU] | INTRAMUSCULAR | Status: DC | PRN

## 2020-10-12 MED ORDER — DELFLEX-LC/4.25% DEXTROSE 483 MOSM/L IP SOLN
INTRAPERITONEAL | Status: DC
  Administered 2020-10-12: 5000 mL via INTRAPERITONEAL

## 2020-10-12 MED ORDER — METOCLOPRAMIDE HCL 5 MG PO TABS
5.0000 mg | ORAL_TABLET | Freq: Three times a day (TID) | ORAL | Status: DC | PRN
Start: 2020-10-12 — End: 2020-10-15

## 2020-10-12 MED ORDER — DOCUSATE SODIUM 100 MG PO CAPS
100.0000 mg | ORAL_CAPSULE | Freq: Two times a day (BID) | ORAL | Status: DC
  Administered 2020-10-12 – 2020-10-14 (×4): 100 mg via ORAL
  Filled 2020-10-12 (×4): qty 1

## 2020-10-12 MED ORDER — FENTANYL CITRATE (PF) 250 MCG/5ML IJ SOLN
INTRAMUSCULAR | Status: DC | PRN
  Administered 2020-10-12: 25 ug via INTRAVENOUS
  Administered 2020-10-12: 100 ug via INTRAVENOUS

## 2020-10-12 MED ORDER — ROCURONIUM BROMIDE 10 MG/ML (PF) SYRINGE
PREFILLED_SYRINGE | INTRAVENOUS | Status: DC | PRN
  Administered 2020-10-12: 60 mg via INTRAVENOUS

## 2020-10-12 MED ORDER — CHLORHEXIDINE GLUCONATE 0.12 % MT SOLN
OROMUCOSAL | Status: AC
  Administered 2020-10-12: 15 mL
  Filled 2020-10-12: qty 15

## 2020-10-12 MED ORDER — FENTANYL CITRATE (PF) 100 MCG/2ML IJ SOLN: 25.0000 ug | INTRAMUSCULAR | Status: DC | PRN

## 2020-10-12 MED ORDER — LIDOCAINE 2% (20 MG/ML) 5 ML SYRINGE
INTRAMUSCULAR | Status: DC | PRN
  Administered 2020-10-12: 100 mg via INTRAVENOUS

## 2020-10-12 MED ORDER — FENTANYL CITRATE (PF) 250 MCG/5ML IJ SOLN
INTRAMUSCULAR | Status: AC
  Filled 2020-10-12: qty 5

## 2020-10-12 MED ORDER — METOCLOPRAMIDE HCL 5 MG/ML IJ SOLN: 5.0000 mg | Freq: Three times a day (TID) | INTRAMUSCULAR | Status: DC | PRN

## 2020-10-12 MED ORDER — NEPRO/CARBSTEADY PO LIQD
237.0000 mL | Freq: Two times a day (BID) | ORAL | Status: DC
  Administered 2020-10-13 (×2): 237 mL via ORAL

## 2020-10-12 MED ORDER — PHENYLEPHRINE HCL (PRESSORS) 10 MG/ML IV SOLN
INTRAVENOUS | Status: AC
  Filled 2020-10-12: qty 1

## 2020-10-12 MED ORDER — LACTATED RINGERS IV SOLN: INTRAVENOUS | Status: DC

## 2020-10-12 MED ORDER — MENTHOL 3 MG MT LOZG: 1.0000 | LOZENGE | OROMUCOSAL | Status: DC | PRN

## 2020-10-12 MED ORDER — SODIUM CHLORIDE 0.9 % IV SOLN: INTRAVENOUS | Status: DC

## 2020-10-12 MED ORDER — POVIDONE-IODINE 10 % EX SWAB
2.0000 "application " | Freq: Once | CUTANEOUS | Status: AC
  Administered 2020-10-12: 2 via TOPICAL

## 2020-10-12 MED ORDER — RENA-VITE PO TABS
1.0000 | ORAL_TABLET | Freq: Every day | ORAL | Status: DC
  Administered 2020-10-12: 1 via ORAL
  Filled 2020-10-12: qty 1

## 2020-10-12 MED ORDER — CEFAZOLIN SODIUM-DEXTROSE 2-4 GM/100ML-% IV SOLN
2.0000 g | INTRAVENOUS | Status: AC
  Administered 2020-10-12: 2 g via INTRAVENOUS
  Filled 2020-10-12: qty 100

## 2020-10-12 MED ORDER — PHENYLEPHRINE HCL-NACL 10-0.9 MG/250ML-% IV SOLN
INTRAVENOUS | Status: DC | PRN
  Administered 2020-10-12: 20 ug/min via INTRAVENOUS

## 2020-10-12 MED ORDER — GENTAMICIN SULFATE 0.1 % EX CREA
1.0000 "application " | TOPICAL_CREAM | Freq: Every day | CUTANEOUS | Status: DC
  Administered 2020-10-12 – 2020-10-14 (×3): 1 via TOPICAL
  Filled 2020-10-12: qty 15

## 2020-10-12 MED ORDER — ONDANSETRON HCL 4 MG/2ML IJ SOLN: 4.0000 mg | Freq: Four times a day (QID) | INTRAMUSCULAR | Status: DC | PRN

## 2020-10-12 MED ORDER — CHLORHEXIDINE GLUCONATE 0.12 % MT SOLN: 15.0000 mL | Freq: Once | OROMUCOSAL | Status: AC

## 2020-10-12 MED ORDER — 0.9 % SODIUM CHLORIDE (POUR BTL) OPTIME
TOPICAL | Status: DC | PRN
  Administered 2020-10-12: 1000 mL

## 2020-10-12 MED ORDER — PROPOFOL 10 MG/ML IV BOLUS
INTRAVENOUS | Status: DC | PRN
  Administered 2020-10-12: 110 mg via INTRAVENOUS

## 2020-10-12 MED ORDER — ORAL CARE MOUTH RINSE: 15.0000 mL | Freq: Once | OROMUCOSAL | Status: AC

## 2020-10-12 MED ORDER — EPHEDRINE SULFATE-NACL 50-0.9 MG/10ML-% IV SOSY
PREFILLED_SYRINGE | INTRAVENOUS | Status: DC | PRN
  Administered 2020-10-12: 10 mg via INTRAVENOUS

## 2020-10-12 SURGICAL SUPPLY — 41 items
BIT DRILL 4.8X300 (BIT) ×2 IMPLANT
BRUSH SCRUB EZ PLAIN DRY (MISCELLANEOUS) ×4 IMPLANT
COVER PERINEAL POST (MISCELLANEOUS) ×2 IMPLANT
COVER SURGICAL LIGHT HANDLE (MISCELLANEOUS) ×4 IMPLANT
COVER WAND RF STERILE (DRAPES) ×2 IMPLANT
DRAPE C-ARMOR (DRAPES) ×2 IMPLANT
DRAPE STERI IOBAN 125X83 (DRAPES) ×2 IMPLANT
DRSG AQUACEL AG ADV 3.5X 6 (GAUZE/BANDAGES/DRESSINGS) ×2 IMPLANT
DRSG MEPILEX BORDER 4X4 (GAUZE/BANDAGES/DRESSINGS) ×2 IMPLANT
ELECT REM PT RETURN 9FT ADLT (ELECTROSURGICAL) ×2
ELECTRODE REM PT RTRN 9FT ADLT (ELECTROSURGICAL) ×1 IMPLANT
GLOVE BIO SURGEON STRL SZ7.5 (GLOVE) ×2 IMPLANT
GLOVE BIO SURGEON STRL SZ8 (GLOVE) ×2 IMPLANT
GLOVE BIOGEL PI IND STRL 7.5 (GLOVE) ×1 IMPLANT
GLOVE BIOGEL PI INDICATOR 7.5 (GLOVE) ×1
GLOVE SRG 8 PF TXTR STRL LF DI (GLOVE) ×1 IMPLANT
GLOVE SURG UNDER POLY LF SZ8 (GLOVE) ×2
GOWN STRL REUS W/ TWL LRG LVL3 (GOWN DISPOSABLE) ×2 IMPLANT
GOWN STRL REUS W/ TWL XL LVL3 (GOWN DISPOSABLE) ×1 IMPLANT
GOWN STRL REUS W/TWL LRG LVL3 (GOWN DISPOSABLE) ×4
GOWN STRL REUS W/TWL XL LVL3 (GOWN DISPOSABLE) ×2
KIT BASIN OR (CUSTOM PROCEDURE TRAY) ×2 IMPLANT
KIT TURNOVER KIT B (KITS) ×2 IMPLANT
MANIFOLD NEPTUNE II (INSTRUMENTS) ×2 IMPLANT
NS IRRIG 1000ML POUR BTL (IV SOLUTION) ×2 IMPLANT
PACK GENERAL/GYN (CUSTOM PROCEDURE TRAY) ×2 IMPLANT
PAD ARMBOARD 7.5X6 YLW CONV (MISCELLANEOUS) ×4 IMPLANT
PIN GUIDE DRILL TIP 2.8X300 (DRILL) ×6 IMPLANT
SCREW CANN 8.0X70 16 THRD (Screw) ×2 IMPLANT
SCREW CANNULATED 8.0X65 (Screw) ×2 IMPLANT
SCREW CANNULATED 8.0X70MM (Screw) ×4 IMPLANT
STAPLER VISISTAT 35W (STAPLE) ×2 IMPLANT
SUT VIC AB 0 CT1 27 (SUTURE) ×2
SUT VIC AB 0 CT1 27XBRD ANBCTR (SUTURE) ×1 IMPLANT
SUT VIC AB 1 CT1 27 (SUTURE) ×2
SUT VIC AB 1 CT1 27XBRD ANBCTR (SUTURE) ×1 IMPLANT
SUT VIC AB 2-0 CT1 27 (SUTURE) ×2
SUT VIC AB 2-0 CT1 TAPERPNT 27 (SUTURE) ×1 IMPLANT
TOWEL GREEN STERILE (TOWEL DISPOSABLE) ×4 IMPLANT
TOWEL GREEN STERILE FF (TOWEL DISPOSABLE) ×2 IMPLANT
WATER STERILE IRR 1000ML POUR (IV SOLUTION) ×2 IMPLANT

## 2020-10-12 NOTE — ED Notes (Signed)
Report gave to Louie Casa, Therapist, sports. Denied questions or concerns regarding report.

## 2020-10-12 NOTE — Progress Notes (Signed)
[place] [id number] [facility]) Hospitalized Hospice Patient   Samantha Conrad is a current hospice patient with [facility] admitted on 09/28/20 with a terminal diagnosis of ESRD. Pt does peritoneal dialysis at home but has been not tolerating it well lately, per [facility] RN, which may have contributed to her fall.  Pt fell yesterday evening and was transported to [facility] where pt was admitted with left femoral neck fracture. Per Dr. Tomasa Hosteller, [facility] MD, this is a related admission.   Visited pt at the bedside, she is alert and oriented. She advised that she simply fell at home, fall was not precipitated by anything, she denies loss of consciousness. Currently, she denies any pain and advised that the staff are taking good care of her. She advised that the plan is to go to OR @ 11 am for ORIF. Samantha Conrad lives alone but her daughter lives next door and checks on her several times per day and provides her meals. Her son lives close as well and helps with decision making.   Vital Signs: 98.7 oral, 146/60, 80 HR, 16 RR,  100% 2L [place]  I&O: not charted Abnormal Labs: BUN 52, Creat 5.94, GFR 7, Ca 8.3, Mg 1.5, Alb 2.3, tot protein 5.2, RBC 2,92, Hgb 8,82mPlatelets 68k Diagnostics: pelvis 2 view Acute minimally displaced left femoral neck fracture IVs/PRNs: NS @ KVO, ancef 2 g IV x 1, fentanyl 50 mcg IV x 2, morphine 0.5 mg IV x 2  GOC: ongoing, she is a hospice patient but may need to pursue SNF s/p ORIF surgery DC planning: ongoing, she lives at home alone and likely cannot return to her same previous living conditions IDT: hospice team updated, discussed with PMT as well and appreciate their input Family: son update by phone by patient  Transfer summary and med list, on shadow chart.  Thank you, JVenia CarbonRN, BSN, [facility]illage [facility]ison

## 2020-10-12 NOTE — ED Notes (Signed)
Report attempted, no answer.  

## 2020-10-12 NOTE — Consult Note (Signed)
Consultation Note Date: 10/12/2020   Patient Name: Samantha Conrad  DOB: Aug 16, 1940  MRN: [id number]  Age / Sex: 80 y.o., female  PCP: [facility] [place] Referring Physician: Debbe Odea, MD  Reason for Consultation: Disposition, Establishing goals of care and Psychosocial/spiritual support  HPI/Patient Profile: 80 y.o. female  with past medical history of CAD, ESRD on PD, HTN, DM2, on O2 as needed history of CVA hypothyroidism NASH questional history of seizures in the past, sleep apnea, chronic calcific pancreatitis admitted on 10/11/2020 with fall with left hip pain and left shoulder pain with radiologically confirmed minimally displaced left hip fracture.  Per review of the chart: The patient has been under the care of home palliative care and was recently referred to home hospice.  She is on peritoneal dialysis which is failing but she wished to continue peritoneal dialysis until failure.  However, she has declined hemodialysis.  She also has significant CAD status post PCI.  Deemed no further cardiac intervention.  She currently is at home alone with the help of family. At baseline she walks with a walker, unable to climb stairs. She is on home oxygen 2 L via nasal cannula.  Today she confirms she was working with home [place] and recently transitioned to hospice at home with continued PD. She has the support of her son, [name] (who primarily pays the bills and provides distanced support as he lives in [place]) and her daughter Edwin Cap (who helps with meals, setting up her insulin and PD bags) who lives in the same complex as her.   She feels her functional status has declined as of late with increased fatigue.  However, she is agreeable to surgery for her hip at this time. She was told that she would have a brief rehab need before returning to home and is happy that she will not need  prolonged rehab. When asked about her pain she states her hip fracture was from an accidental fall. It was hurting but they have been keeping her comfortable.  Toward the end of our visit, nursing came into the room to bring patient to the OR for hip fracture repair.  The patient faces advance directive decisions, treatment option decisions, and anticipatory care needs.  Clinical Assessment and Goals of Care:  This NP Walden Field reviewed medical records, received report from team, assessed the patient and then meet at the patient's bedside  to discuss diagnosis and current medical situation.   Concept of Palliative Care was introduced as specialized medical care for people and their families living with serious illness.  If focuses on providing relief from the symptoms and stress of a serious illness.  The goal is to improve quality of life for both the patient and the family. Values and goals of care important to patient and family were attempted to be elicited.   Education was provided today regarding advanced directives. Concepts specific to code status, artifical feeding and hydration, continued IV antibiotics and rehospitalization was had.  The difference between a aggressive  medical intervention path  and a palliative comfort care path for this patient at this time was had.     MOST form was reviewed in the system and found to be dated 2019.   Questions and concerns addressed. Patient encouraged to call with questions or concerns.     PMT will continue to support holistically.  I called the patient's son [name] and daughter Edwin Cap about the patients current medical situation. They expressed they are here to support her decisions.  Primary Decision Maker PATIENT with support from her two children  Advanced Directive in the system and names her son [name] as HCPOA    SUMMARY OF RECOMMENDATIONS   1. Follow-up with the patient post-op to discuss [place] and wishes 2. Update MOST form  post-op  Code Status/Advance Care Planning:  DNR  Palliative Prophylaxis:   Bowel Regimen and Frequent Pain Assessment  Additional Recommendations (Limitations, Scope, Preferences):  Full Scope Treatment  Prognosis:   Unable to determine  Discharge Planning: To Be Determined      Primary Diagnoses: Present on Admission: . Closed left hip fracture ([place]) . Thrombocytopenia ([place]) . NASH (nonalcoholic steatohepatitis) . Hypothyroidism . Essential hypertension . Dyslipidemia, goal LDL below 70 . DM (diabetes mellitus), type 2 with renal complications ([place]) . Cirrhosis ([place]) . Anemia   I have reviewed the medical record, interviewed the patient and family, and examined the patient. The following aspects are pertinent.  Past Medical History:  Diagnosis Date  . Anemia   . Basal cell carcinoma     on nose/face  . CAD (coronary artery disease)    a. 08/2018: posterior STEMI s/p DES to LCx x2  . Depression   . ESRD on peritoneal dialysis ([place])    "q night" (11/29/2017)  . Fatty liver   . Hypertension   . Hypothyroidism   . NASH (nonalcoholic steatohepatitis)   . Seizures ([place])    ? when she may have had a stroke sometime in the spring of 2018  . Sleep apnea    diagnosed years ago "after I'd gained alot of weight;  no longer a problem since weight loss"  . Stroke [facility]) 04/2016   memory issues, lost 1/2 vision in both eyes  . Thrombocytopenia ([place])   . Thyroid disease   . Type II diabetes mellitus ([place])    Social History   Socioeconomic History  . Marital status: Widowed    Spouse name: Not on file  . Number of children: Not on file  . Years of education: Not on file  . Highest education level: Not on file  Occupational History  . Not on file  Tobacco Use  . Smoking status: Never Smoker  . Smokeless tobacco: Former Systems developer    Types: Snuff  . Tobacco comment: "no snuff since < 1980s"  Vaping Use  . Vaping Use: Never used  Substance and Sexual Activity  .  Alcohol use: Never  . Drug use: Never  . Sexual activity: Not Currently  Other Topics Concern  . Not on file  Social History Narrative  . Not on file   Social Determinants of Health   Financial Resource Strain: Not on file  Food Insecurity: Not on file  Transportation Needs: Not on file  Physical Activity: Not on file  Stress: Not on file  Social Connections: Not on file   Family History  Problem Relation Age of Onset  . Tuberculosis Mother    Scheduled Meds: . amLODipine  5 mg Oral QHS  .  atorvastatin  40 mg Oral q1800  . carvedilol  3.125 mg Oral BID WC  . insulin aspart  0-6 Units Subcutaneous Q4H  . insulin glargine  5 Units Subcutaneous QHS  . levETIRAcetam  250 mg Oral BID WC  . levothyroxine  100 mcg Oral QAC breakfast  . pantoprazole  40 mg Oral Q breakfast  . sevelamer carbonate  800 mg Oral TID WC  . traZODone  50 mg Oral QHS   Continuous Infusions: . methocarbamol (ROBAXIN) IV     PRN Meds:.HYDROcodone-acetaminophen, methocarbamol **OR** methocarbamol (ROBAXIN) IV, morphine injection, ondansetron (ZOFRAN) IV, polyethylene glycol Allergies  Allergen Reactions  . Contrast Media [Iodinated Diagnostic Agents] Swelling    SWELLING REACTION UNSPECIFIED   . Tape Other (See Comments) and Dermatitis    Tears skin - please use paper tape Paper tape only   Review of Systems  Constitutional: Positive for activity change and fatigue.  Musculoskeletal:       Hip pain    Physical Exam Constitutional:      General: She is awake. She is not in acute distress. Neurological:     Mental Status: She is alert.     Vital Signs: BP (!) 145/60 (BP Location: Left Arm)   Pulse 80   Temp 98.7 F (37.1 C) (Oral)   Resp 16   Ht 5' 1"  (1.549 m)   Wt 74.7 kg   SpO2 100%   BMI 31.12 kg/m  Pain Scale: 0-10 POSS *See Group Information*: 1-Acceptable,Awake and alert Pain Score: 5    SpO2: SpO2: 100 % O2 Device:SpO2: 100 % O2 Flow Rate: .O2 Flow Rate (L/min): 2  L/min  IO: Intake/output summary:   Intake/Output Summary (Last 24 hours) at 10/12/2020 9432 Last data filed at 10/12/2020 0200 Gross per 24 hour  Intake --  Output 200 ml  Net -200 ml    LBM: Last BM Date: 10/11/20 Baseline Weight: Weight: 74.7 kg Most recent weight: Weight: 74.7 kg     Palliative Assessment/Data:     Time In: 9:30 Time Out: 10:45 Time Total: 75 minutes Greater than 50%  of this time was spent counseling and coordinating care related to the above assessment and plan.  Signed by: Walden Field, NP Palliative Medicine Team Pager # [phone] (M-F 8a-5p) Team Phone # [phone] (Nights/Weekends)

## 2020-10-12 NOTE — Progress Notes (Signed)
SLP Cancellation Note  Patient Details Name: Samantha Conrad MRN: [id number] DOB: Dec 05, 1940   Cancelled treatment:       Reason Eval/Treat Not Completed: Medical issues which prohibited therapy. Order received for swallowing evaluation but pt is NPO pending procedure. Will f/u as able.    Osie Bond., M.A. [facility] Pager [phone] Office [phone]  10/12/2020, 9:22 AM

## 2020-10-12 NOTE — Consult Note (Signed)
Reason for Consult:Left hip fx Referring Physician: S Rizwan Time called: 0730 Time at bedside: [place] is an 80 y.o. female.  HPI: Samantha Conrad got up to check and see if it was raining yesterday. On her way she lost her balance and fell. She had immediate left hip pain and could not get up. She was brought to the ED where evaluation showed a left hip fx and orthopedic surgery was consulted. She c/o localized pain to the area. She lives alone and normally ambulates with a RW but if she's in the house she'll often just hold onto the walls as she was doing when this happened.  Past Medical History:  Diagnosis Date  . Anemia   . Basal cell carcinoma     on nose/face  . CAD (coronary artery disease)    a. 08/2018: posterior STEMI s/p DES to LCx x2  . Depression   . ESRD on peritoneal dialysis ([place])    "q night" (11/29/2017)  . Fatty liver   . Hypertension   . Hypothyroidism   . NASH (nonalcoholic steatohepatitis)   . Seizures ([place])    ? when she may have had a stroke sometime in the spring of 2018  . Sleep apnea    diagnosed years ago "after I'd gained alot of weight;  no longer a problem since weight loss"  . Stroke [facility]) 04/2016   memory issues, lost 1/2 vision in both eyes  . Thrombocytopenia ([place])   . Thyroid disease   . Type II diabetes mellitus ([place])     Past Surgical History:  Procedure Laterality Date  . ABDOMINAL HYSTERECTOMY    . AV FISTULA PLACEMENT Right 10/30/2016   Procedure: INSERTION OF ARTERIOVENOUS (AV) GORE-TEX GRAFT RIGHT UPPER ARM;  Surgeon: Elam Dutch, MD;  Location: [facility];  Service: Vascular;  Laterality: Right;  . CATARACT EXTRACTION W/ INTRAOCULAR LENS  IMPLANT, BILATERAL Bilateral   . CHOLECYSTECTOMY OPEN    . CORNEAL TRANSPLANT Bilateral   . CORONARY ANGIOGRAPHY N/A 09/06/2018   Procedure: CORONARY ANGIOGRAPHY;  Surgeon: Lorretta Harp, MD;  Location: [facility];  Service: Cardiovascular;  Laterality: N/A;  . CORONARY/GRAFT  ACUTE MI REVASCULARIZATION N/A 09/06/2018   Procedure: Coronary/Graft Acute MI Revascularization;  Surgeon: Lorretta Harp, MD;  Location: [facility];  Service: Cardiovascular;  Laterality: N/A;  . EYE SURGERY    . KNEE ARTHROSCOPY Left   . LEFT HEART CATH AND CORONARY ANGIOGRAPHY N/A 09/06/2018   Procedure: LEFT HEART CATH AND CORONARY ANGIOGRAPHY;  Surgeon: Lorretta Harp, MD;  Location: [facility];  Service: Cardiovascular;  Laterality: N/A;  . MOHS SURGERY     "nose"  . SHOULDER OPEN ROTATOR CUFF REPAIR Left   . THROMBECTOMY AND REVISION OF ARTERIOVENTOUS (AV) GORETEX  GRAFT Right 12/04/2016   Procedure: THROMBECTOMY/ REVISION OF RIGHT UPPER ARM ARTERIOVENOUS GORETEX GRAFT;  Surgeon: Elam Dutch, MD;  Location: [facility];  Service: Vascular;  Laterality: Right;  . TONSILLECTOMY      Family History  Problem Relation Age of Onset  . Tuberculosis Mother     Social History:  reports that she has never smoked. She has quit using smokeless tobacco.  Her smokeless tobacco use included snuff. She reports that she does not drink alcohol and does not use drugs.  Allergies:  Allergies  Allergen Reactions  . Contrast Media [Iodinated Diagnostic Agents] Swelling    SWELLING REACTION UNSPECIFIED   . Tape Other (See  Comments) and Dermatitis    Tears skin - please use paper tape Paper tape only    Medications: I have reviewed the patient's current medications.  Results for orders placed or performed during the hospital encounter of 10/11/20 (from the past 48 hour(s))  Basic metabolic panel     Status: Abnormal   Collection Time: 10/11/20  8:01 PM  Result Value Ref Range   Sodium 135 135 - 145 mmol/L   Potassium 3.8 3.5 - 5.1 mmol/L   Chloride 96 (L) 98 - 111 mmol/L   CO2 25 22 - 32 mmol/L   Glucose, Bld 104 (H) 70 - 99 mg/dL    Comment: Glucose reference range applies only to samples taken after fasting for at least 8 hours.   BUN 51 (H) 8 - 23 mg/dL   Creatinine,  Ser 6.13 (H) 0.44 - 1.00 mg/dL   Calcium 8.8 (L) 8.9 - 10.3 mg/dL   GFR, Estimated 6 (L) >60 mL/min    Comment: (NOTE) Calculated using the CKD-EPI Creatinine Equation (2021)    Anion gap 14 5 - 15    Comment: Performed at [facility] [address] [place], [place] [address]  CBC WITH DIFFERENTIAL     Status: Abnormal   Collection Time: 10/11/20  8:01 PM  Result Value Ref Range   WBC 7.0 4.0 - 10.5 K/uL   RBC 3.30 (L) 3.87 - 5.11 MIL/uL   Hemoglobin 9.4 (L) 12.0 - 15.0 g/dL   HCT 32.3 (L) 36.0 - 46.0 %   MCV 97.9 80.0 - 100.0 fL   MCH 28.5 26.0 - 34.0 pg   MCHC 29.1 (L) 30.0 - 36.0 g/dL   RDW 18.0 (H) 11.5 - 15.5 %   Platelets 82 (L) 150 - 400 K/uL    Comment: Immature Platelet Fraction may be clinically indicated, consider ordering this additional test [id number] REPEATED TO VERIFY PLATELET COUNT CONFIRMED BY SMEAR    nRBC 0.0 0.0 - 0.2 %   Neutrophils Relative % 71 %   Neutro Abs 5.0 1.7 - 7.7 K/uL   Lymphocytes Relative 15 %   Lymphs Abs 1.0 0.7 - 4.0 K/uL   Monocytes Relative 11 %   Monocytes Absolute 0.7 0.1 - 1.0 K/uL   Eosinophils Relative 2 %   Eosinophils Absolute 0.2 0.0 - 0.5 K/uL   Basophils Relative 0 %   Basophils Absolute 0.0 0.0 - 0.1 K/uL   Immature Granulocytes 1 %   Abs Immature Granulocytes 0.07 0.00 - 0.07 K/uL    Comment: Performed at [facility], [place] [address] [place], [place] [address]  Protime-INR     Status: None   Collection Time: 10/11/20  8:01 PM  Result Value Ref Range   Prothrombin Time 14.0 11.4 - 15.2 seconds   INR 1.1 0.8 - 1.2    Comment: (NOTE) INR goal varies based on device and disease states. Performed at [facility], [place] [address], [place], [place] [address]   Type and screen [place]     Status: None   Collection Time: 10/11/20  8:11 PM  Result Value Ref Range   ABO/RH(D) A POS    Antibody Screen NEG    Sample Expiration      10/14/2020,2359 Performed at [facility], [place] [address], [place],  [address]   Resp Panel by RT-PCR (Flu A&B, Covid) Nasopharyngeal Swab     Status: None   Collection Time: 10/11/20  9:06 PM   Specimen: Nasopharyngeal Swab; Nasopharyngeal(NP) swabs in vial transport medium  Result Value Ref Range   SARS Coronavirus 2 by RT PCR NEGATIVE NEGATIVE    Comment: (NOTE) SARS-CoV-2 target nucleic acids are NOT DETECTED.  The SARS-CoV-2 RNA is generally detectable in upper respiratory specimens during the acute phase of infection. The lowest concentration of SARS-CoV-2 viral copies this assay can detect is 138 copies/mL. A negative result does not preclude SARS-Cov-2 infection and should not be used as the sole basis for treatment or other patient management decisions. A negative result may occur with  improper specimen collection/handling, submission of specimen other than nasopharyngeal swab, presence of viral mutation(s) within the areas targeted by this assay, and inadequate number of viral copies(<138 copies/mL). A negative result must be combined with clinical observations, patient history, and epidemiological information. The expected result is Negative.  Fact Sheet for Patients:  EntrepreneurPulse.com.au  Fact Sheet for Healthcare Providers:  IncredibleEmployment.be  This test is no t yet approved or cleared by the [place] FDA and  has been authorized for detection and/or diagnosis of SARS-CoV-2 by FDA under an Emergency Use Authorization (EUA). This EUA will remain  in effect (meaning this test can be used) for the duration of the COVID-19 declaration under Section 564(b)(1) of the Act, 21 U.S.C.section 360bbb-3(b)(1), unless the authorization is terminated  or revoked sooner.       Influenza A by PCR NEGATIVE NEGATIVE   Influenza B by PCR NEGATIVE NEGATIVE    Comment: (NOTE) The Xpert Xpress SARS-CoV-2/FLU/RSV plus assay is intended as an aid in the diagnosis of  influenza from Nasopharyngeal swab specimens and should not be used as a sole basis for treatment. Nasal washings and aspirates are unacceptable for Xpert Xpress SARS-CoV-2/FLU/RSV testing.  Fact Sheet for Patients: EntrepreneurPulse.com.au  Fact Sheet for Healthcare Providers: IncredibleEmployment.be  This test is not yet approved or cleared by the [place] FDA and has been authorized for detection and/or diagnosis of SARS-CoV-2 by FDA under an Emergency Use Authorization (EUA). This EUA will remain in effect (meaning this test can be used) for the duration of the COVID-19 declaration under Section 564(b)(1) of the Act, 21 U.S.C. section 360bbb-3(b)(1), unless the authorization is terminated or revoked.  Performed at [facility], [place] [address], [place], [place] [address]   CBG monitoring, ED     Status: Abnormal   Collection Time: 10/11/20 11:18 PM  Result Value Ref Range   Glucose-Capillary 120 (H) 70 - 99 mg/dL    Comment: Glucose reference range applies only to samples taken after fasting for at least 8 hours.  Hepatic function panel     Status: Abnormal   Collection Time: 10/12/20  1:22 AM  Result Value Ref Range   Total Protein 5.2 (L) 6.5 - 8.1 g/dL   Albumin 2.3 (L) 3.5 - 5.0 g/dL   AST 22 15 - 41 U/L   ALT 20 0 - 44 U/L   Alkaline Phosphatase 112 38 - 126 U/L   Total Bilirubin 0.5 0.3 - 1.2 mg/dL   Bilirubin, Direct 0.1 0.0 - 0.2 mg/dL   Indirect Bilirubin 0.4 0.3 - 0.9 mg/dL    Comment: Performed at [facility] [address], [place], [place] [address]  Magnesium     Status: Abnormal   Collection Time: 10/12/20  1:22 AM  Result Value Ref Range   Magnesium 1.5 (L) 1.7 - 2.4 mg/dL    Comment: Performed at [facility], [address] [place], [place] [address]  Phosphorus     Status: None   Collection Time: 10/12/20  1:22 AM  Result Value Ref Range   Phosphorus 3.2 2.5 - 4.6 mg/dL    Comment:  Performed at [facility] [address] [place], [place] [address]  Troponin I (High Sensitivity)     Status: Abnormal   Collection Time: 10/12/20  1:22 AM  Result Value Ref Range   Troponin I (High Sensitivity) 69 (H) <18 ng/L    Comment: (NOTE) Elevated high sensitivity troponin I (hsTnI) values and significant  changes across serial measurements may suggest ACS but many other  chronic and acute conditions are known to elevate hsTnI results.  Refer to the "Links" section for chest pain algorithms and additional  guidance. Performed at [facility], [place] [address] [place], [place] [address]   VITAMIN D 25 Hydroxy (Vit-D Deficiency, Fractures)     Status: None   Collection Time: 10/12/20  1:22 AM  Result Value Ref Range   Vit D, 25-Hydroxy 30.28 30 - 100 ng/mL    Comment: (NOTE) Vitamin D deficiency has been defined by the Institute of Medicine  and an Endocrine Society practice guideline as a level of serum 25-OH  vitamin D less than 20 ng/mL (1,2). The Endocrine Society went on to  further define vitamin D insufficiency as a level between 21 and 29  ng/mL (2).  1. IOM (Institute of Medicine). 2010. Dietary reference intakes for  calcium and D. [place]: The [employer]. 2. Holick MF, Binkley [place], Bischoff-Ferrari HA, et al. Evaluation,  treatment, and prevention of vitamin D deficiency: an Endocrine  Society clinical practice guideline, JCEM. 2011 Jul; 96(7): 1911-30.  Performed at [facility], [place] [address] [place], [place] [address]   Hemoglobin A1c     Status: Abnormal   Collection Time: 10/12/20  1:23 AM  Result Value Ref Range   Hgb A1c MFr Bld 6.6 (H) 4.8 - 5.6 %    Comment: (NOTE) Pre diabetes:          5.7%-6.4%  Diabetes:              >6.4%  Glycemic control for   <7.0% adults with diabetes    Mean Plasma Glucose 142.72 mg/dL    Comment: Performed at [facility] [address] [place], [place] [address]  CBC      Status: Abnormal   Collection Time: 10/12/20  1:40 AM  Result Value Ref Range   WBC 7.1 4.0 - 10.5 K/uL   RBC 2.92 (L) 3.87 - 5.11 MIL/uL   Hemoglobin 8.5 (L) 12.0 - 15.0 g/dL   HCT 28.7 (L) 36.0 - 46.0 %   MCV 98.3 80.0 - 100.0 fL   MCH 29.1 26.0 - 34.0 pg   MCHC 29.6 (L) 30.0 - 36.0 g/dL   RDW 18.2 (H) 11.5 - 15.5 %   Platelets 68 (L) 150 - 400 K/uL    Comment: Immature Platelet Fraction may be clinically indicated, consider ordering this additional test [id number] CONSISTENT WITH PREVIOUS RESULT REPEATED TO VERIFY    nRBC 0.0 0.0 - 0.2 %    Comment: Performed at [facility], [place] [address] [place], [place] [address]  Basic metabolic panel     Status: Abnormal   Collection Time: 10/12/20  1:40 AM  Result Value Ref Range   Sodium 136 135 - 145 mmol/L  Potassium 4.0 3.5 - 5.1 mmol/L   Chloride 100 98 - 111 mmol/L   CO2 21 (L) 22 - 32 mmol/L   Glucose, Bld 144 (H) 70 - 99 mg/dL    Comment: Glucose reference range applies only to samples taken after fasting for at least 8 hours.   BUN 52 (H) 8 - 23 mg/dL   Creatinine, Ser 5.94 (H) 0.44 - 1.00 mg/dL   Calcium 8.3 (L) 8.9 - 10.3 mg/dL   GFR, Estimated 7 (L) >60 mL/min    Comment: (NOTE) Calculated using the CKD-EPI Creatinine Equation (2021)    Anion gap 15 5 - 15    Comment: Performed at [facility] [address] [place], [place] [address]  MRSA PCR Screening     Status: None   Collection Time: 10/12/20  2:43 AM   Specimen: Nasal Mucosa; Nasopharyngeal  Result Value Ref Range   MRSA by PCR NEGATIVE NEGATIVE    Comment:        The GeneXpert MRSA Assay (FDA approved for NASAL specimens only), is one component of a comprehensive MRSA colonization surveillance program. It is not intended to diagnose MRSA infection nor to guide or monitor treatment for MRSA infections. Performed at [facility], [place] [address], [place], [place] [address]   Glucose, capillary     Status: Abnormal   Collection  Time: 10/12/20  4:17 AM  Result Value Ref Range   Glucose-Capillary 176 (H) 70 - 99 mg/dL    Comment: Glucose reference range applies only to samples taken after fasting for at least 8 hours.    CT HEAD WO CONTRAST  Result Date: 10/11/2020 CLINICAL DATA:  Fall with head trauma EXAM: CT HEAD WITHOUT CONTRAST CT CERVICAL SPINE WITHOUT CONTRAST TECHNIQUE: Multidetector CT imaging of the head and cervical spine was performed following the standard protocol without intravenous contrast. Multiplanar CT image reconstructions of the cervical spine were also generated. COMPARISON:  August 10, 2020 FINDINGS: CT HEAD FINDINGS Brain: No evidence of acute large vascular territory infarction, hemorrhage, hydrocephalus, extra-axial collection or mass lesion/mass effect. Age related global parenchymal volume loss with ex vacuo dilatation of ventricular system. Stable burden of chronic microvascular disease ischemic disease. Encephalomalacia in left occipital lobe with ex vacuo dilatation of the occipital horn of the lateral ventricle, sequela of prior insult. Mineralization of bilateral basal ganglia. Posterior fossa arachnoid cyst. Vascular: No hyperdense vessel. Atherosclerotic calcifications of the internal carotid arteries at skull base. Skull: Hyperostosis interna.  Negative for fracture or focal lesion. Sinuses/Orbits: The visualized paranasal sinuses and mastoid air cells are predominantly clear. Other: None CT CERVICAL SPINE FINDINGS Alignment: Preservation of the normal cervical lordosis. Grade 1 degenerative anterolisthesis of C3 on C4 and C6 on C7. No evidence of traumatic listhesis. Skull base and vertebrae: No acute fracture. No primary bone lesion or focal pathologic process. Soft tissues and spinal canal: No prevertebral fluid or swelling. No visible canal hematoma. Disc levels: Lower cervical predominant multilevel degenerative change of the cervical spine with disc space narrowing osteophytosis and  uncovertebral/facet hypertrophy. Upper chest: Small left greater than right layering pleural effusions with adjacent compressive atelectasis. Other: None IMPRESSION: 1. No evidence of acute intracranial pathology. 2. No evidence of acute fracture or traumatic listhesis of the cervical spine. 3. Lower cervical predominant multilevel degenerative change of the cervical spine. 4. Small left greater than right layering pleural effusions with adjacent compressive atelectasis. Electronically Signed   By: Dahlia Bailiff MD   On: 10/11/2020  21:54   CT CERVICAL SPINE WO CONTRAST  Result Date: 10/11/2020 CLINICAL DATA:  Fall with head trauma EXAM: CT HEAD WITHOUT CONTRAST CT CERVICAL SPINE WITHOUT CONTRAST TECHNIQUE: Multidetector CT imaging of the head and cervical spine was performed following the standard protocol without intravenous contrast. Multiplanar CT image reconstructions of the cervical spine were also generated. COMPARISON:  August 10, 2020 FINDINGS: CT HEAD FINDINGS Brain: No evidence of acute large vascular territory infarction, hemorrhage, hydrocephalus, extra-axial collection or mass lesion/mass effect. Age related global parenchymal volume loss with ex vacuo dilatation of ventricular system. Stable burden of chronic microvascular disease ischemic disease. Encephalomalacia in left occipital lobe with ex vacuo dilatation of the occipital horn of the lateral ventricle, sequela of prior insult. Mineralization of bilateral basal ganglia. Posterior fossa arachnoid cyst. Vascular: No hyperdense vessel. Atherosclerotic calcifications of the internal carotid arteries at skull base. Skull: Hyperostosis interna.  Negative for fracture or focal lesion. Sinuses/Orbits: The visualized paranasal sinuses and mastoid air cells are predominantly clear. Other: None CT CERVICAL SPINE FINDINGS Alignment: Preservation of the normal cervical lordosis. Grade 1 degenerative anterolisthesis of C3 on C4 and C6 on C7. No evidence of  traumatic listhesis. Skull base and vertebrae: No acute fracture. No primary bone lesion or focal pathologic process. Soft tissues and spinal canal: No prevertebral fluid or swelling. No visible canal hematoma. Disc levels: Lower cervical predominant multilevel degenerative change of the cervical spine with disc space narrowing osteophytosis and uncovertebral/facet hypertrophy. Upper chest: Small left greater than right layering pleural effusions with adjacent compressive atelectasis. Other: None IMPRESSION: 1. No evidence of acute intracranial pathology. 2. No evidence of acute fracture or traumatic listhesis of the cervical spine. 3. Lower cervical predominant multilevel degenerative change of the cervical spine. 4. Small left greater than right layering pleural effusions with adjacent compressive atelectasis. Electronically Signed   By: Dahlia Bailiff MD   On: 10/11/2020 21:54   DG Chest Port 1 View  Result Date: 10/11/2020 CLINICAL DATA:  Fall 2 hours ago EXAM: PORTABLE CHEST 1 VIEW COMPARISON:  08/10/2020 FINDINGS: Mild cardiomegaly with suspected trace pleural effusions. Vascular congestion and mild diffuse interstitial process likely edema. Patchy atelectasis left base. Aortic atherosclerosis. No pneumothorax. IMPRESSION: 1. Cardiomegaly with probable small pleural effusions. Vascular congestion and mild interstitial pulmonary edema 2. Patchy atelectasis or pneumonia left base Electronically Signed   By: Donavan Foil M.D.   On: 10/11/2020 20:27   DG Hip Unilat With Pelvis 2-3 Views Left  Result Date: 10/11/2020 CLINICAL DATA:  Fall EXAM: DG HIP (WITH OR WITHOUT PELVIS) 2-3V LEFT COMPARISON:  CT 01/22/2019 FINDINGS: SI joints are non widened. Pubic symphysis and rami appear intact. Acute mildly displaced subcapital left femoral neck fracture. No femoral head dislocation IMPRESSION: Acute minimally displaced left femoral neck fracture Electronically Signed   By: Donavan Foil M.D.   On: 10/11/2020  20:28    Review of Systems  HENT: Negative for ear discharge, ear pain, hearing loss and tinnitus.   Eyes: Negative for photophobia and pain.  Respiratory: Negative for cough and shortness of breath.   Cardiovascular: Negative for chest pain.  Gastrointestinal: Negative for abdominal pain, nausea and vomiting.  Genitourinary: Negative for dysuria, flank pain, frequency and urgency.  Musculoskeletal: Positive for arthralgias (Left hip). Negative for back pain, myalgias and neck pain.  Neurological: Negative for dizziness and headaches.  Hematological: Does not bruise/bleed easily.  Psychiatric/Behavioral: The patient is not nervous/anxious.    Blood pressure (!) 145/60, pulse 80, temperature 98.7 F (  37.1 C), temperature source Oral, resp. rate 16, height 5' 1"  (1.549 m), weight 74.7 kg, SpO2 100 %. Physical Exam Constitutional:      General: She is not in acute distress.    Appearance: She is well-developed. She is not diaphoretic.  HENT:     Head: Normocephalic and atraumatic.  Eyes:     General: No scleral icterus.       Right eye: No discharge.        Left eye: No discharge.     Conjunctiva/sclera: Conjunctivae normal.  Cardiovascular:     Rate and Rhythm: Normal rate and regular rhythm.  Pulmonary:     Effort: Pulmonary effort is normal. No respiratory distress.  Musculoskeletal:     Cervical back: Normal range of motion.     Comments: LLE No traumatic wounds, ecchymosis, or rash  Mild TTP hip  No knee or ankle effusion  Knee stable to varus/ valgus and anterior/posterior stress  Sens DPN, SPN, TN intact  Motor EHL, ext, flex, evers 5/5  DP 2+, PT 1+, No significant edema  Skin:    General: Skin is warm and dry.  Neurological:     Mental Status: She is alert.  Psychiatric:        Behavior: Behavior normal.     Assessment/Plan: Left hip fx -- Cannulated hip pinning vs hip hemi with Dr. Marcelino Scot later this morning. Please keep NPO. Multiple medical problems  including CAD, ESRD on PD, HTN, DM2, on O2 as needed, history of CVA, hypothyroidism, and NASH -- per primary service    Samantha Abu, PA-C Orthopedic Surgery [phone] 10/12/2020, 8:48 AM

## 2020-10-12 NOTE — Transfer of Care (Signed)
Immediate Anesthesia Transfer of Care Note  Patient: Samantha Conrad  Procedure(s) Performed: CANNULATED HIP PINNING (Left Hip)  Patient Location: PACU  Anesthesia Type:General  Level of Consciousness: drowsy and patient cooperative  Airway & Oxygen Therapy: Patient Spontanous Breathing and Patient connected to face mask oxygen  Post-op Assessment: Report given to RN and Post -op Vital signs reviewed and stable  Post vital signs: Reviewed and stable  Last Vitals:  Vitals Value Taken Time  BP 144/67 10/12/20 1339  Temp    Pulse 82 10/12/20 1343  Resp 24 10/12/20 1343  SpO2 94 % 10/12/20 1343  Vitals shown include unvalidated device data.  Last Pain:  Vitals:   10/12/20 1027  TempSrc:   PainSc: 6       Patients Stated Pain Goal: 3 (25/95/63 8756)  Complications: No complications documented.

## 2020-10-12 NOTE — Progress Notes (Signed)
PROGRESS NOTE    Samantha Conrad   YBO:[id number]  DOB: March 13, 1941  DOA: 10/11/2020 PCP: [facility]   Brief Narrative:  Samantha Conrad is a 80 y.o. 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   Subjective: No pain in hip unless she moves    Assessment & Plan:   Principal Problem:   Impacted, Closed left femoral fracture (HCC) - s/p cannulated screw fixation - per ortho> resume Plavix, touchdown WB for 6 wks- at risk for nonunion, avascular necrosis and hardware failure due to poor quality bone and fracture pattern  Active Problems:   Cirrhosis  NASH  Thrombocytopenia  - follow for potential bleeding in post op period    Essential hypertension - Coreg and Amlodipine reordered with holding parameters    Hypothyroidism - cont Synthroid    ESRD (end stage renal disease) on peritoneal dialysis   SHPT AOCD - appreciate nephrology managing this    Seizure disorder - cont Keppra     CAD S/P percutaneous coronary angioplasty - on Plavix and statin    DM (diabetes mellitus), type 2 with renal complications ([place])  - holding Glucatrol, Actos,Tradjenta- resumed Lantus at 5 U- cont SSI TID     Component Value Date/Time   HGBA1C 6.6 (H) 10/12/2020 0123    Time spent in minutes: 35 DVT prophylaxis: Plavix Code Status: DNR Family Communication:  Level of Care: Level of care: Telemetry Medical Disposition Plan:  Status is: Inpatient  Remains inpatient appropriate because:Inpatient level of care appropriate due to severity of illness   Dispo: The patient is from: Home              Anticipated d/c is to: TBD              Patient currently is not medically  stable to d/c.   Difficult to place patient No      Consultants:   Ortho  Nephrology  Palliative caer Procedures:  CANNULATED SCREW FIXATION OF LEFT FEMORAL NECK FRACTURE WITH 8.0 MM BIOMET CANNULATED SCREWS WITH STAR DRIVE HEADS  Antimicrobials:  Anti-infectives (From admission, onward)   Start     Dose/Rate Route Frequency Ordered Stop   10/12/20 1030  ceFAZolin (ANCEF) IVPB 2g/100 mL premix        2 g 200 mL/hr over 30 Minutes Intravenous On call to O.R. 10/12/20 0939 10/12/20 1232       Objective: Vitals:   10/12/20 1340 10/12/20 1355 10/12/20 1410 10/12/20 1432  BP: (!) 144/67  (!) 154/64 (!) 142/55  Pulse: 79  83 83  Resp: (!) 21  (!) 21 14  Temp: (!) 97 F (36.1 C)  (!) 97 F (36.1 C) 97.9 F (36.6 C)  TempSrc:    Oral  SpO2: 95% 94% 94% 95%  Weight:      Height:        Intake/Output Summary (Last 24 hours) at 10/12/2020 1711 Last data filed at 10/12/2020 1500 Gross per 24 hour  Intake 450 ml  Output 226 ml  Net 224 ml   Filed Weights   10/11/20 1922 10/12/20 1027  Weight: 74.7 kg 74.7 kg    Examination: General exam: Appears comfortable  HEENT: PERRLA,  oral mucosa moist, no sclera icterus or thrush Respiratory system: Clear to auscultation. Respiratory effort normal. Cardiovascular system: S1 & S2 heard, RRR.   Gastrointestinal system: Abdomen soft, non-tender, nondistended. Normal bowel sounds. PD catheter present Central nervous system: Alert and oriented. No focal neurological deficits. Extremities: No cyanosis, clubbing or edema Skin: No rashes or ulcers Psychiatry:  Mood & affect appropriate.     Data Reviewed: I have personally reviewed following labs and imaging studies  CBC: Recent Labs  Lab 10/11/20 2001 10/12/20 0140  WBC 7.0 7.1  NEUTROABS 5.0  --   HGB 9.4* 8.5*  HCT 32.3* 28.7*  MCV 97.9 98.3  PLT 82* 68*   Basic Metabolic Panel: Recent Labs  Lab 10/11/20 2001 10/12/20 0122 10/12/20 0140  NA 135  --  136  K  3.8  --  4.0  CL 96*  --  100  CO2 25  --  21*  GLUCOSE 104*  --  144*  BUN 51*  --  52*  CREATININE 6.13*  --  5.94*  CALCIUM 8.8*  --  8.3*  MG  --  1.5*  --   PHOS  --  3.2  --    GFR: Estimated Creatinine Clearance: 6.9 mL/min (A) (by C-G formula based on SCr of 5.94 mg/dL (H)). Liver Function Tests: Recent Labs  Lab 10/12/20 0122  AST 22  ALT 20  ALKPHOS 112  BILITOT 0.5  PROT 5.2*  ALBUMIN 2.3*   No results for input(s): LIPASE, AMYLASE in the last 168 hours. No results for input(s): AMMONIA in the last 168 hours. Coagulation Profile: Recent Labs  Lab 10/11/20 2001  INR 1.1   Cardiac Enzymes: No results for input(s): CKTOTAL, CKMB, CKMBINDEX, TROPONINI in the last 168 hours. BNP (last 3 results) No results for input(s): PROBNP in the last 8760 hours. HbA1C: Recent Labs    10/12/20 0123  HGBA1C 6.6*   CBG: Recent Labs  Lab 10/12/20 0417 10/12/20 0850 10/12/20 1015 10/12/20 1340 10/12/20 1625  GLUCAP 176* 145* 131* 114* 138*   Lipid Profile: No results for input(s): CHOL, HDL, LDLCALC, TRIG, CHOLHDL, LDLDIRECT in the last 72 hours. Thyroid Function Tests: No results for input(s): TSH, T4TOTAL, FREET4, T3FREE, THYROIDAB in the last 72 hours. Anemia Panel: No results for input(s): VITAMINB12, FOLATE, FERRITIN, TIBC, IRON, RETICCTPCT in the last 72 hours. Urine analysis:    Component Value Date/Time   COLORURINE STRAW (A) 08/10/2020 2329   APPEARANCEUR CLEAR 08/10/2020 2329   LABSPEC 1.006 08/10/2020 2329   PHURINE 8.0 08/10/2020 2329   GLUCOSEU 150 (A) 08/10/2020 2329   HGBUR SMALL (A) 08/10/2020 2329   BILIRUBINUR NEGATIVE 08/10/2020 2329   KETONESUR NEGATIVE 08/10/2020 2329   PROTEINUR 30 (A) 08/10/2020 2329   NITRITE NEGATIVE 08/10/2020 2329   LEUKOCYTESUR NEGATIVE 08/10/2020 2329   Sepsis Labs: @LABRCNTIP (procalcitonin:4,lacticidven:4) ) Recent Results (from the past 240 hour(s))  Resp Panel by RT-PCR (Flu A&B, Covid) Nasopharyngeal  Swab     Status: None   Collection Time: 10/11/20  9:06 PM   Specimen: Nasopharyngeal Swab; Nasopharyngeal(NP) swabs in vial transport medium  Result Value Ref Range Status   SARS Coronavirus 2 by RT PCR NEGATIVE NEGATIVE Final    Comment: (NOTE) SARS-CoV-2 target nucleic acids are NOT DETECTED.  The SARS-CoV-2 RNA is generally detectable in upper respiratory specimens during the acute phase of infection. The lowest concentration of SARS-CoV-2 viral copies this assay can detect is 138 copies/mL. A negative result does not preclude SARS-Cov-2 infection and  should not be used as the sole basis for treatment or other patient management decisions. A negative result may occur with  improper specimen collection/handling, submission of specimen other than nasopharyngeal swab, presence of viral mutation(s) within the areas targeted by this assay, and inadequate number of viral copies(<138 copies/mL). A negative result must be combined with clinical observations, patient history, and epidemiological information. The expected result is Negative.  Fact Sheet for Patients:  EntrepreneurPulse.com.au  Fact Sheet for Healthcare Providers:  IncredibleEmployment.be  This test is no t yet approved or cleared by the [place] FDA and  has been authorized for detection and/or diagnosis of SARS-CoV-2 by FDA under an Emergency Use Authorization (EUA). This EUA will remain  in effect (meaning this test can be used) for the duration of the COVID-19 declaration under Section 564(b)(1) of the Act, 21 U.S.C.section 360bbb-3(b)(1), unless the authorization is terminated  or revoked sooner.       Influenza A by PCR NEGATIVE NEGATIVE Final   Influenza B by PCR NEGATIVE NEGATIVE Final    Comment: (NOTE) The Xpert Xpress SARS-CoV-2/FLU/RSV plus assay is intended as an aid in the diagnosis of influenza from Nasopharyngeal swab specimens and should not be used as a sole  basis for treatment. Nasal washings and aspirates are unacceptable for Xpert Xpress SARS-CoV-2/FLU/RSV testing.  Fact Sheet for Patients: EntrepreneurPulse.com.au  Fact Sheet for Healthcare Providers: IncredibleEmployment.be  This test is not yet approved or cleared by the [place] FDA and has been authorized for detection and/or diagnosis of SARS-CoV-2 by FDA under an Emergency Use Authorization (EUA). This EUA will remain in effect (meaning this test can be used) for the duration of the COVID-19 declaration under Section 564(b)(1) of the Act, 21 U.S.C. section 360bbb-3(b)(1), unless the authorization is terminated or revoked.  Performed at [facility], [place] [address], [place], [place] [address]   MRSA PCR Screening     Status: None   Collection Time: 10/12/20  2:43 AM   Specimen: Nasal Mucosa; Nasopharyngeal  Result Value Ref Range Status   MRSA by PCR NEGATIVE NEGATIVE Final    Comment:        The GeneXpert MRSA Assay (FDA approved for NASAL specimens only), is one component of a comprehensive MRSA colonization surveillance program. It is not intended to diagnose MRSA infection nor to guide or monitor treatment for MRSA infections. Performed at [facility], [place] [address], [place], [place] [address]          Radiology Studies: CT HEAD WO CONTRAST  Result Date: 10/11/2020 CLINICAL DATA:  Fall with head trauma EXAM: CT HEAD WITHOUT CONTRAST CT CERVICAL SPINE WITHOUT CONTRAST TECHNIQUE: Multidetector CT imaging of the head and cervical spine was performed following the standard protocol without intravenous contrast. Multiplanar CT image reconstructions of the cervical spine were also generated. COMPARISON:  August 10, 2020 FINDINGS: CT HEAD FINDINGS Brain: No evidence of acute large vascular territory infarction, hemorrhage, hydrocephalus, extra-axial collection or mass lesion/mass effect. Age related global  parenchymal volume loss with ex vacuo dilatation of ventricular system. Stable burden of chronic microvascular disease ischemic disease. Encephalomalacia in left occipital lobe with ex vacuo dilatation of the occipital horn of the lateral ventricle, sequela of prior insult. Mineralization of bilateral basal ganglia. Posterior fossa arachnoid cyst. Vascular: No hyperdense vessel. Atherosclerotic calcifications of the internal carotid arteries at skull base. Skull: Hyperostosis interna.  Negative for fracture or focal lesion. Sinuses/Orbits: The visualized paranasal sinuses and mastoid air cells are predominantly  clear. Other: None CT CERVICAL SPINE FINDINGS Alignment: Preservation of the normal cervical lordosis. Grade 1 degenerative anterolisthesis of C3 on C4 and C6 on C7. No evidence of traumatic listhesis. Skull base and vertebrae: No acute fracture. No primary bone lesion or focal pathologic process. Soft tissues and spinal canal: No prevertebral fluid or swelling. No visible canal hematoma. Disc levels: Lower cervical predominant multilevel degenerative change of the cervical spine with disc space narrowing osteophytosis and uncovertebral/facet hypertrophy. Upper chest: Small left greater than right layering pleural effusions with adjacent compressive atelectasis. Other: None IMPRESSION: 1. No evidence of acute intracranial pathology. 2. No evidence of acute fracture or traumatic listhesis of the cervical spine. 3. Lower cervical predominant multilevel degenerative change of the cervical spine. 4. Small left greater than right layering pleural effusions with adjacent compressive atelectasis. Electronically Signed   By: Dahlia Bailiff MD   On: 10/11/2020 21:54   CT CERVICAL SPINE WO CONTRAST  Result Date: 10/11/2020 CLINICAL DATA:  Fall with head trauma EXAM: CT HEAD WITHOUT CONTRAST CT CERVICAL SPINE WITHOUT CONTRAST TECHNIQUE: Multidetector CT imaging of the head and cervical spine was performed following  the standard protocol without intravenous contrast. Multiplanar CT image reconstructions of the cervical spine were also generated. COMPARISON:  August 10, 2020 FINDINGS: CT HEAD FINDINGS Brain: No evidence of acute large vascular territory infarction, hemorrhage, hydrocephalus, extra-axial collection or mass lesion/mass effect. Age related global parenchymal volume loss with ex vacuo dilatation of ventricular system. Stable burden of chronic microvascular disease ischemic disease. Encephalomalacia in left occipital lobe with ex vacuo dilatation of the occipital horn of the lateral ventricle, sequela of prior insult. Mineralization of bilateral basal ganglia. Posterior fossa arachnoid cyst. Vascular: No hyperdense vessel. Atherosclerotic calcifications of the internal carotid arteries at skull base. Skull: Hyperostosis interna.  Negative for fracture or focal lesion. Sinuses/Orbits: The visualized paranasal sinuses and mastoid air cells are predominantly clear. Other: None CT CERVICAL SPINE FINDINGS Alignment: Preservation of the normal cervical lordosis. Grade 1 degenerative anterolisthesis of C3 on C4 and C6 on C7. No evidence of traumatic listhesis. Skull base and vertebrae: No acute fracture. No primary bone lesion or focal pathologic process. Soft tissues and spinal canal: No prevertebral fluid or swelling. No visible canal hematoma. Disc levels: Lower cervical predominant multilevel degenerative change of the cervical spine with disc space narrowing osteophytosis and uncovertebral/facet hypertrophy. Upper chest: Small left greater than right layering pleural effusions with adjacent compressive atelectasis. Other: None IMPRESSION: 1. No evidence of acute intracranial pathology. 2. No evidence of acute fracture or traumatic listhesis of the cervical spine. 3. Lower cervical predominant multilevel degenerative change of the cervical spine. 4. Small left greater than right layering pleural effusions with adjacent  compressive atelectasis. Electronically Signed   By: Dahlia Bailiff MD   On: 10/11/2020 21:54   DG Chest Port 1 View  Result Date: 10/11/2020 CLINICAL DATA:  Fall 2 hours ago EXAM: PORTABLE CHEST 1 VIEW COMPARISON:  08/10/2020 FINDINGS: Mild cardiomegaly with suspected trace pleural effusions. Vascular congestion and mild diffuse interstitial process likely edema. Patchy atelectasis left base. Aortic atherosclerosis. No pneumothorax. IMPRESSION: 1. Cardiomegaly with probable small pleural effusions. Vascular congestion and mild interstitial pulmonary edema 2. Patchy atelectasis or pneumonia left base Electronically Signed   By: Donavan Foil M.D.   On: 10/11/2020 20:27   DG C-Arm 1-60 Min-No Report  Result Date: 10/12/2020 Fluoroscopy was utilized by the requesting physician.  No radiographic interpretation.   DG Hip Port Unilat With Pelvis 1V  Left  Result Date: 10/12/2020 CLINICAL DATA:  Status post surgical internal fixation of left hip fracture. EXAM: DG HIP (WITH OR WITHOUT PELVIS) 1V PORT LEFT COMPARISON:  Fluoroscopic images of same day. FINDINGS: Status post surgical internal fixation of proximal left femoral fracture with 3 surgical screws. Good alignment of fracture components is noted. IMPRESSION: Status post surgical internal fixation of proximal left femoral neck fracture. Electronically Signed   By: Marijo Conception M.D.   On: 10/12/2020 16:28   DG HIP OPERATIVE UNILAT WITH PELVIS LEFT  Result Date: 10/12/2020 CLINICAL DATA:  Fixation for fracture EXAM: OPERATIVE LEFT HIP   2 VIEWS TECHNIQUE: Fluoroscopic spot image(s) were submitted for interpretation post-operatively. COMPARISON:  Preoperative pelvis and left hip radiographs Oct 11, 2020 FLUOROSCOPY TIME:  76.5 seconds; 27.28 mGy; 4 acquired images FINDINGS: Frontal and lateral views obtained. There are 3 screws transfixing a fracture of the subcapital femoral neck fracture with alignment fracture site essentially anatomic. Screw tips  are in the proximal femoral head. No new fracture. No dislocation. Mild narrowing left hip joint. IMPRESSION: Screws transfix a fracture of the subcapital femoral neck region with screw tips in proximal femoral head. Alignment anatomic at fracture site. No new fracture. No dislocation. Narrowing left hip joint. Electronically Signed   By: Lowella Grip III M.D.   On: 10/12/2020 15:07   DG Hip Unilat With Pelvis 2-3 Views Left  Result Date: 10/11/2020 CLINICAL DATA:  Fall EXAM: DG HIP (WITH OR WITHOUT PELVIS) 2-3V LEFT COMPARISON:  CT 01/22/2019 FINDINGS: SI joints are non widened. Pubic symphysis and rami appear intact. Acute mildly displaced subcapital left femoral neck fracture. No femoral head dislocation IMPRESSION: Acute minimally displaced left femoral neck fracture Electronically Signed   By: Donavan Foil M.D.   On: 10/11/2020 20:28      Scheduled Meds: . amLODipine  5 mg Oral QHS  . atorvastatin  40 mg Oral q1800  . carvedilol  3.125 mg Oral BID WC  . [START ON 10/13/2020] clopidogrel  75 mg Oral Daily  . docusate sodium  100 mg Oral BID  . feeding supplement (NEPRO CARB STEADY)  237 mL Oral BID BM  . gentamicin cream  1 application Topical Daily  . insulin aspart  0-6 Units Subcutaneous Q4H  . insulin glargine  5 Units Subcutaneous QHS  . levETIRAcetam  250 mg Oral BID WC  . levothyroxine  100 mcg Oral QAC breakfast  . multivitamin  1 tablet Oral QHS  . pantoprazole  40 mg Oral Q breakfast  . sevelamer carbonate  800 mg Oral TID WC  . traZODone  50 mg Oral QHS   Continuous Infusions: . sodium chloride 10 mL/hr at 10/12/20 1204  . dialysis solution 4.25% low-MG/low-CA    . methocarbamol (ROBAXIN) IV       LOS: 1 day      Debbe Odea, MD [facility] Pager: [email or web address] 10/12/2020, 5:11 PM

## 2020-10-12 NOTE — Anesthesia Preprocedure Evaluation (Signed)
Anesthesia Evaluation  Patient identified by MRN, date of birth, ID band Patient awake    Reviewed: Allergy & Precautions, NPO status , Patient's Chart, lab work & pertinent test results, reviewed documented beta blocker date and time   Airway Mallampati: II       Dental  (+) Edentulous Upper, Upper Dentures, Partial Lower, Poor Dentition   Pulmonary    Pulmonary exam normal        Cardiovascular hypertension, Pt. on medications and Pt. on home beta blockers + CAD, + Past MI and + Cardiac Stents  Normal cardiovascular exam     Neuro/Psych PSYCHIATRIC DISORDERS Depression    GI/Hepatic GERD  Medicated,  Endo/Other  diabetes, Type 2, Oral Hypoglycemic AgentsHypothyroidism   Renal/GU ESRF and DialysisRenal disease     Musculoskeletal negative musculoskeletal ROS (+)   Abdominal (+) + obese,   Peds  Hematology  (+) Blood dyscrasia, anemia ,   Anesthesia Other Findings Left ventricular ejection fraction, by estimation, is 60 to 65%. The left ventricle has normal function. The left ventricle has no regional wall motion abnormalities. Left ventricular diastolic parameters are indeterminate. 2. Right ventricular systolic function is normal. The right ventricular size is normal. There is normal pulmonary artery systolic pressure. The estimated right ventricular systolic pressure is 78.5 mmHg. 3. A small pericardial effusion is present. The pericardial effusion is circumferential. 4. The mitral valve is grossly normal. Mild mitral valve regurgitation. No evidence of mitral stenosis. 5. The aortic valve is tricuspid. There is mild calcification of the aortic valve. There is mild thickening of the aortic valve. Aortic valve regurgitation is not visualized. No aortic stenosis is present. 6. The inferior vena cava is normal in size with greater than 50% respiratory variability, suggesting right atrial pressure of 3  mmHg. Left  Reproductive/Obstetrics                             Anesthesia Physical Anesthesia Plan  ASA: III  Anesthesia Plan: General   Post-op Pain Management:    Induction: Intravenous  PONV Risk Score and Plan: 4 or greater and Ondansetron and Treatment may vary due to age or medical condition  Airway Management Planned: Oral ETT  Additional Equipment: None  Intra-op Plan:   Post-operative Plan: Extubation in OR  Informed Consent: I have reviewed the patients History and Physical, chart, labs and discussed the procedure including the risks, benefits and alternatives for the proposed anesthesia with the patient or authorized representative who has indicated his/her understanding and acceptance.     Dental advisory given  Plan Discussed with: CRNA  Anesthesia Plan Comments:         Anesthesia Quick Evaluation

## 2020-10-12 NOTE — Consult Note (Signed)
[facility] Renal Consultation Note    Indication for Consultation:  Management of ESRD/hemodialysis; anemia, hypertension/volume and secondary hyperparathyroidism  MBW:[id number], [facility]  HPI: Samantha Conrad is a 80 y.o. female. ESRD on PD 7 days a week.  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  Patient presented to the ED following a fall at home.  Seen and examined at bedside post surgical repair of L femoral neck.  Admits to pain currently after just completing an Xray.  Reports she is unsure what happened she just fell.  Denies dizziness, lightheadedness, LOC associated with the fall.  Admits to SOB and orthopnea states she has been having trouble getting fluid off for some time.  With her nightly PD has been using mostly 4% bags with a 2.5% bag for her last one with minimal UF removed.  Was on HD in the past but has had 2 failed accesses and does not have a current access for HD.    Of note patient is currently under the care of hospice/palliative care.  She has adamantly refused to return to HD in the past although she has UF failure and can not adequately manage her volume status.    Patient admitted for further evaluation and management of L femoral neck fracture.    Past Medical History:  Diagnosis Date  . Anemia   . Basal cell carcinoma     on nose/face  . CAD (coronary artery disease)    a. 08/2018: posterior STEMI s/p DES to LCx x2  . Depression   . ESRD on peritoneal dialysis ([place])    "q night" (11/29/2017)  . Fatty liver   . Hypertension   . Hypothyroidism   . NASH (nonalcoholic steatohepatitis)   . Seizures ([place])    ? when she may have had a stroke sometime in the spring of 2018  . Sleep apnea    diagnosed years ago "after I'd gained alot of  weight;  no longer a problem since weight loss"  . Stroke [facility]) 04/2016   memory issues, lost 1/2 vision in both eyes  . Thrombocytopenia ([place])   . Thyroid disease   . Type II diabetes mellitus ([place])    Past Surgical History:  Procedure Laterality Date  . ABDOMINAL HYSTERECTOMY    . AV FISTULA PLACEMENT Right 10/30/2016   Procedure: INSERTION OF ARTERIOVENOUS (AV) GORE-TEX GRAFT RIGHT UPPER ARM;  Surgeon: Elam Dutch, MD;  Location: [place];  Service: Vascular;  Laterality: Right;  . CATARACT EXTRACTION W/ INTRAOCULAR LENS  IMPLANT, BILATERAL Bilateral   . CHOLECYSTECTOMY OPEN    . CORNEAL TRANSPLANT Bilateral   . CORONARY ANGIOGRAPHY N/A 09/06/2018   Procedure: CORONARY ANGIOGRAPHY;  Surgeon: Lorretta Harp, MD;  Location: [place] [facility];  Service: Cardiovascular;  Laterality: N/A;  . CORONARY/GRAFT ACUTE MI REVASCULARIZATION N/A 09/06/2018   Procedure: Coronary/Graft Acute MI Revascularization;  Surgeon: Lorretta Harp, MD;  Location: [place] [facility];  Service: Cardiovascular;  Laterality: N/A;  . EYE SURGERY    . KNEE ARTHROSCOPY Left   . LEFT HEART CATH AND CORONARY ANGIOGRAPHY N/A 09/06/2018   Procedure: LEFT HEART CATH AND CORONARY ANGIOGRAPHY;  Surgeon: Lorretta Harp, MD;  Location: [place] [facility];  Service: Cardiovascular;  Laterality: N/A;  . MOHS SURGERY     "nose"  .  SHOULDER OPEN ROTATOR CUFF REPAIR Left   . THROMBECTOMY AND REVISION OF ARTERIOVENTOUS (AV) GORETEX  GRAFT Right 12/04/2016   Procedure: THROMBECTOMY/ REVISION OF RIGHT UPPER ARM ARTERIOVENOUS GORETEX GRAFT;  Surgeon: Elam Dutch, MD;  Location: [place];  Service: Vascular;  Laterality: Right;  . TONSILLECTOMY     Family History  Problem Relation Age of Onset  . Tuberculosis Mother    Social History:  reports that she has never smoked. She has quit using smokeless tobacco.  Her smokeless tobacco use included snuff. She reports that she does not drink alcohol and does not use  drugs. Allergies  Allergen Reactions  . Contrast Media [Iodinated Diagnostic Agents] Swelling    SWELLING REACTION UNSPECIFIED   . Tape Other (See Comments) and Dermatitis    Tears skin - please use paper tape Paper tape only   Prior to Admission medications   Medication Sig Start Date End Date Taking? Authorizing Provider  acetaminophen-codeine (TYLENOL #3) 300-30 MG tablet Take 1-2 tablets by mouth every 6 (six) hours as needed for moderate pain.   Yes [provider]  Amino Acids-Protein Hydrolys (FEEDING SUPPLEMENT, PRO-STAT SUGAR FREE 64,) LIQD Take 30 mLs by mouth 2 (two) times daily. 03/16/17  Yes Patrecia Pour, Christean Grief, MD  amLODipine (NORVASC) 5 MG tablet TAKE 1 TABLET BY MOUTH EVERY DAY Patient taking differently: Take 5 mg by mouth at bedtime. 08/13/20  Yes Troy Sine, MD  atorvastatin (LIPITOR) 40 MG tablet TAKE 1 TABLET (40 MG TOTAL) BY MOUTH DAILY AT 6 PM. 01/13/20 03/20/20 Yes Troy Sine, MD  carvedilol (COREG) 3.125 MG tablet Take 1 tablet (3.125 mg total) by mouth 2 (two) times daily with a meal. 09/20/20 10/20/20 Yes Troy Sine, MD  cholecalciferol (VITAMIN D3) 25 MCG (1000 UNIT) tablet Take 2,000 Units by mouth daily with breakfast.   Yes [provider]  citalopram (CELEXA) 10 MG tablet Take 10 mg by mouth daily with breakfast.  10/15/17  Yes [provider]  clopidogrel (PLAVIX) 75 MG tablet TAKE 1 TABLET (75 MG TOTAL) BY MOUTH DAILY WITH BREAKFAST. 11/04/19  Yes Troy Sine, MD  docusate sodium (COLACE) 100 MG capsule Take 100 mg by mouth at bedtime.   Yes [provider]  glipiZIDE (GLUCOTROL) 5 MG tablet Take 5 mg by mouth daily before breakfast. 06/25/18  Yes [provider]  LANTUS SOLOSTAR 100 UNIT/ML Solostar Pen Inject 14 Units into the skin at bedtime. 05/10/20  Yes [provider]  levETIRAcetam (KEPPRA) 250 MG tablet Take 250 mg by mouth 2 (two) times daily with a meal.    Yes [provider]   levothyroxine (SYNTHROID, LEVOTHROID) 100 MCG tablet Take 100 mcg by mouth daily before breakfast.  09/02/16  Yes [provider]  linagliptin (TRADJENTA) 5 MG TABS tablet Take 5 mg by mouth daily with breakfast.    Yes [provider]  lipase/protease/amylase (CREON) 12000-38000 units CPEP capsule Take 48,000 Units by mouth 3 (three) times daily with meals.   Yes [provider]  Methoxy PEG-Epoetin Beta (MIRCERA IJ) Inject into the skin. 03/09/20  Yes [provider]  multivitamin (PROSIGHT) TABS tablet Take 1 tablet by mouth daily. Patient taking differently: Take 1 tablet by mouth daily with breakfast. 03/17/17  Yes Patrecia Pour, Christean Grief, MD  nitroGLYCERIN (NITROSTAT) 0.4 MG SL tablet Place 0.4 mg under the tongue every 5 (five) minutes as needed for chest pain.    Yes [provider]  Nutritional Supplements (  FEEDING SUPPLEMENT, NEPRO CARB STEADY,) LIQD Take 237 mLs by mouth daily. Patient taking differently: Take 237 mLs by mouth daily with lunch. 03/17/17  Yes Patrecia Pour, Christean Grief, MD  pantoprazole (PROTONIX) 40 MG tablet Take 40 mg by mouth daily with breakfast.  08/21/18  Yes [provider]  pioglitazone (ACTOS) 15 MG tablet Take 15 mg by mouth daily with breakfast. 07/10/20  Yes [provider]  polyethylene glycol (MIRALAX / GLYCOLAX) packet Take 8.5-17 g by mouth daily.   Yes [provider]  potassium chloride SA (KLOR-CON) 20 MEQ tablet Take 20 mEq by mouth daily with breakfast.   Yes [provider]  prednisoLONE acetate (PRED FORTE) 1 % ophthalmic suspension Place 1 drop into both eyes at bedtime.    Yes [provider]  sevelamer carbonate (RENVELA) 800 MG tablet Take 800 mg by mouth 3 (three) times daily with meals.  07/04/18  Yes [provider]  traZODone (DESYREL) 50 MG tablet Take 50 mg by mouth at bedtime.   Yes [provider]   Current Facility-Administered Medications   Medication Dose Route Frequency Provider Last Rate Last Admin  . 0.9 %  sodium chloride infusion   Intravenous Continuous Ainsley Spinner, PA-C 10 mL/hr at 10/12/20 1204 Restarted at 10/12/20 1333  . amLODipine (NORVASC) tablet 5 mg  5 mg Oral QHS Ainsley Spinner, PA-C   5 mg at 10/11/20 2339  . atorvastatin (LIPITOR) tablet 40 mg  40 mg Oral q1800 Ainsley Spinner, PA-C      . carvedilol (COREG) tablet 3.125 mg  3.125 mg Oral BID WC Ainsley Spinner, PA-C   3.125 mg at 10/12/20 0854  . [START ON 10/13/2020] clopidogrel (PLAVIX) tablet 75 mg  75 mg Oral Daily Rizwan, Saima, MD      . docusate sodium (COLACE) capsule 100 mg  100 mg Oral BID Ainsley Spinner, PA-C      . feeding supplement (NEPRO CARB STEADY) liquid 237 mL  237 mL Oral BID BM Ainsley Spinner, PA-C      . HYDROcodone-acetaminophen (NORCO/VICODIN) 5-325 MG per tablet 1-2 tablet  1-2 tablet Oral Q6H PRN Ainsley Spinner, PA-C      . insulin aspart (novoLOG) injection 0-6 Units  0-6 Units Subcutaneous Q4H Ainsley Spinner, PA-C   1 Units at 10/12/20 0435  . insulin glargine (LANTUS) injection 5 Units  5 Units Subcutaneous QHS Ainsley Spinner, PA-C      . levETIRAcetam (KEPPRA) tablet 250 mg  250 mg Oral BID WC Ainsley Spinner, PA-C   250 mg at 10/12/20 0857  . levothyroxine (SYNTHROID) tablet 100 mcg  100 mcg Oral QAC breakfast Ainsley Spinner, PA-C   100 mcg at 10/12/20 0557  . menthol-cetylpyridinium (CEPACOL) lozenge 3 mg  1 lozenge Oral PRN Ainsley Spinner, PA-C       Or  . phenol (CHLORASEPTIC) mouth spray 1 spray  1 spray Mouth/Throat PRN Ainsley Spinner, PA-C      . methocarbamol (ROBAXIN) tablet 500 mg  500 mg Oral Q6H PRN Ainsley Spinner, PA-C       Or  . methocarbamol (ROBAXIN) 500 mg in dextrose 5 % 50 mL IVPB  500 mg Intravenous Q6H PRN Ainsley Spinner, PA-C      . metoCLOPramide (REGLAN) tablet 5-10 mg  5-10 mg Oral Q8H PRN Ainsley Spinner, PA-C       Or  . metoCLOPramide (REGLAN) injection 5-10 mg  5-10 mg Intravenous Q8H PRN Ainsley Spinner, PA-C      . morphine 2 MG/ML  injection 0.5 mg   0.5 mg Intravenous Q2H PRN Ainsley Spinner, PA-C   0.5 mg at 10/12/20 0853  . multivitamin (RENA-VIT) tablet 1 tablet  1 tablet Oral QHS Ainsley Spinner, PA-C      . ondansetron [facility]) injection 4 mg  4 mg Intravenous Q6H PRN Ainsley Spinner, PA-C   4 mg at 10/11/20 2239  . ondansetron (ZOFRAN) tablet 4 mg  4 mg Oral Q6H PRN Ainsley Spinner, PA-C       Or  . ondansetron [facility]) injection 4 mg  4 mg Intravenous Q6H PRN Ainsley Spinner, PA-C      . pantoprazole (PROTONIX) EC tablet 40 mg  40 mg Oral Q breakfast Ainsley Spinner, PA-C   40 mg at 10/12/20 0854  . polyethylene glycol (MIRALAX / GLYCOLAX) packet 17 g  17 g Oral Daily PRN Ainsley Spinner, PA-C      . sevelamer carbonate (RENVELA) tablet 800 mg  800 mg Oral TID WC Ainsley Spinner, PA-C      . traZODone (DESYREL) tablet 50 mg  50 mg Oral QHS Ainsley Spinner, PA-C   50 mg at 10/11/20 2339   Labs: Basic Metabolic Panel: Recent Labs  Lab 10/11/20 2001 10/12/20 0122 10/12/20 0140  NA 135  --  136  K 3.8  --  4.0  CL 96*  --  100  CO2 25  --  21*  GLUCOSE 104*  --  144*  BUN 51*  --  52*  CREATININE 6.13*  --  5.94*  CALCIUM 8.8*  --  8.3*  PHOS  --  3.2  --    Liver Function Tests: Recent Labs  Lab 10/12/20 0122  AST 22  ALT 20  ALKPHOS 112  BILITOT 0.5  PROT 5.2*  ALBUMIN 2.3*   No results for input(s): LIPASE, AMYLASE in the last 168 hours. No results for input(s): AMMONIA in the last 168 hours. CBC: Recent Labs  Lab 10/11/20 2001 10/12/20 0140  WBC 7.0 7.1  NEUTROABS 5.0  --   HGB 9.4* 8.5*  HCT 32.3* 28.7*  MCV 97.9 98.3  PLT 82* 68*   Cardiac Enzymes: No results for input(s): CKTOTAL, CKMB, CKMBINDEX, TROPONINI in the last 168 hours. CBG: Recent Labs  Lab 10/11/20 2318 10/12/20 0417 10/12/20 0850 10/12/20 1015 10/12/20 1340  GLUCAP 120* 176* 145* 131* 114*   Iron Studies: No results for input(s): IRON, TIBC, TRANSFERRIN, FERRITIN in the last 72 hours. Studies/Results: CT HEAD WO CONTRAST  Result Date: 10/11/2020 CLINICAL  DATA:  Fall with head trauma EXAM: CT HEAD WITHOUT CONTRAST CT CERVICAL SPINE WITHOUT CONTRAST TECHNIQUE: Multidetector CT imaging of the head and cervical spine was performed following the standard protocol without intravenous contrast. Multiplanar CT image reconstructions of the cervical spine were also generated. COMPARISON:  August 10, 2020 FINDINGS: CT HEAD FINDINGS Brain: No evidence of acute large vascular territory infarction, hemorrhage, hydrocephalus, extra-axial collection or mass lesion/mass effect. Age related global parenchymal volume loss with ex vacuo dilatation of ventricular system. Stable burden of chronic microvascular disease ischemic disease. Encephalomalacia in left occipital lobe with ex vacuo dilatation of the occipital horn of the lateral ventricle, sequela of prior insult. Mineralization of bilateral basal ganglia. Posterior fossa arachnoid cyst. Vascular: No hyperdense vessel. Atherosclerotic calcifications of the internal carotid arteries at skull base. Skull: Hyperostosis interna.  Negative for fracture or focal lesion. Sinuses/Orbits: The visualized paranasal sinuses and mastoid air cells are predominantly clear. Other: None CT CERVICAL SPINE FINDINGS Alignment: Preservation of the normal  cervical lordosis. Grade 1 degenerative anterolisthesis of C3 on C4 and C6 on C7. No evidence of traumatic listhesis. Skull base and vertebrae: No acute fracture. No primary bone lesion or focal pathologic process. Soft tissues and spinal canal: No prevertebral fluid or swelling. No visible canal hematoma. Disc levels: Lower cervical predominant multilevel degenerative change of the cervical spine with disc space narrowing osteophytosis and uncovertebral/facet hypertrophy. Upper chest: Small left greater than right layering pleural effusions with adjacent compressive atelectasis. Other: None IMPRESSION: 1. No evidence of acute intracranial pathology. 2. No evidence of acute fracture or traumatic  listhesis of the cervical spine. 3. Lower cervical predominant multilevel degenerative change of the cervical spine. 4. Small left greater than right layering pleural effusions with adjacent compressive atelectasis. Electronically Signed   By: Dahlia Bailiff MD   On: 10/11/2020 21:54   CT CERVICAL SPINE WO CONTRAST  Result Date: 10/11/2020 CLINICAL DATA:  Fall with head trauma EXAM: CT HEAD WITHOUT CONTRAST CT CERVICAL SPINE WITHOUT CONTRAST TECHNIQUE: Multidetector CT imaging of the head and cervical spine was performed following the standard protocol without intravenous contrast. Multiplanar CT image reconstructions of the cervical spine were also generated. COMPARISON:  August 10, 2020 FINDINGS: CT HEAD FINDINGS Brain: No evidence of acute large vascular territory infarction, hemorrhage, hydrocephalus, extra-axial collection or mass lesion/mass effect. Age related global parenchymal volume loss with ex vacuo dilatation of ventricular system. Stable burden of chronic microvascular disease ischemic disease. Encephalomalacia in left occipital lobe with ex vacuo dilatation of the occipital horn of the lateral ventricle, sequela of prior insult. Mineralization of bilateral basal ganglia. Posterior fossa arachnoid cyst. Vascular: No hyperdense vessel. Atherosclerotic calcifications of the internal carotid arteries at skull base. Skull: Hyperostosis interna.  Negative for fracture or focal lesion. Sinuses/Orbits: The visualized paranasal sinuses and mastoid air cells are predominantly clear. Other: None CT CERVICAL SPINE FINDINGS Alignment: Preservation of the normal cervical lordosis. Grade 1 degenerative anterolisthesis of C3 on C4 and C6 on C7. No evidence of traumatic listhesis. Skull base and vertebrae: No acute fracture. No primary bone lesion or focal pathologic process. Soft tissues and spinal canal: No prevertebral fluid or swelling. No visible canal hematoma. Disc levels: Lower cervical predominant  multilevel degenerative change of the cervical spine with disc space narrowing osteophytosis and uncovertebral/facet hypertrophy. Upper chest: Small left greater than right layering pleural effusions with adjacent compressive atelectasis. Other: None IMPRESSION: 1. No evidence of acute intracranial pathology. 2. No evidence of acute fracture or traumatic listhesis of the cervical spine. 3. Lower cervical predominant multilevel degenerative change of the cervical spine. 4. Small left greater than right layering pleural effusions with adjacent compressive atelectasis. Electronically Signed   By: Dahlia Bailiff MD   On: 10/11/2020 21:54   DG Chest Port 1 View  Result Date: 10/11/2020 CLINICAL DATA:  Fall 2 hours ago EXAM: PORTABLE CHEST 1 VIEW COMPARISON:  08/10/2020 FINDINGS: Mild cardiomegaly with suspected trace pleural effusions. Vascular congestion and mild diffuse interstitial process likely edema. Patchy atelectasis left base. Aortic atherosclerosis. No pneumothorax. IMPRESSION: 1. Cardiomegaly with probable small pleural effusions. Vascular congestion and mild interstitial pulmonary edema 2. Patchy atelectasis or pneumonia left base Electronically Signed   By: Donavan Foil M.D.   On: 10/11/2020 20:27   DG C-Arm 1-60 Min-No Report  Result Date: 10/12/2020 Fluoroscopy was utilized by the requesting physician.  No radiographic interpretation.   DG HIP OPERATIVE UNILAT WITH PELVIS LEFT  Result Date: 10/12/2020 CLINICAL DATA:  Fixation for fracture EXAM: OPERATIVE  LEFT HIP   2 VIEWS TECHNIQUE: Fluoroscopic spot image(s) were submitted for interpretation post-operatively. COMPARISON:  Preoperative pelvis and left hip radiographs Oct 11, 2020 FLUOROSCOPY TIME:  76.5 seconds; 27.28 mGy; 4 acquired images FINDINGS: Frontal and lateral views obtained. There are 3 screws transfixing a fracture of the subcapital femoral neck fracture with alignment fracture site essentially anatomic. Screw tips are in the  proximal femoral head. No new fracture. No dislocation. Mild narrowing left hip joint. IMPRESSION: Screws transfix a fracture of the subcapital femoral neck region with screw tips in proximal femoral head. Alignment anatomic at fracture site. No new fracture. No dislocation. Narrowing left hip joint. Electronically Signed   By: Lowella Grip III M.D.   On: 10/12/2020 15:07   DG Hip Unilat With Pelvis 2-3 Views Left  Result Date: 10/11/2020 CLINICAL DATA:  Fall EXAM: DG HIP (WITH OR WITHOUT PELVIS) 2-3V LEFT COMPARISON:  CT 01/22/2019 FINDINGS: SI joints are non widened. Pubic symphysis and rami appear intact. Acute mildly displaced subcapital left femoral neck fracture. No femoral head dislocation IMPRESSION: Acute minimally displaced left femoral neck fracture Electronically Signed   By: Donavan Foil M.D.   On: 10/11/2020 20:28    ROS: All others negative except those listed in HPI.  General: No weight loss, fever, chills  HEENT: No recent headaches, nasal bleeding,  visual changes, sore throat or dysphagia Neurologic: No dizziness, blackouts, seizures. No recent symptoms of stroke or mini- stroke. No recent episodes of slurred speech, or temporary blindness.  Cardiac: No recent episodes of chest pain/pressure,  shortness of breath at rest or DOE.  Vascular: No history of rest pain in feet, claudication, nonhealing ulcers  or DVT  Pulmonary: No home oxygen,no cough, hemoptysis, or wheezing  Musculoskeletal: no arthritis, low back pain, or joint pain  Hematologic:No history of hypercoagulable state. No history of easy bleeding. No history of anemia  Gastrointestinal: No hematochezia or melena, No gastroesophageal reflux, no trouble swallowing  Urinary: Anuric or makes small amount of urine, no nurning with urination or frequency   Skin: No rashes or lesions Psychological: No  anxiety or depression   Physical Exam: Vitals:   10/12/20 1340 10/12/20 1355 10/12/20 1410 10/12/20 1432  BP:  (!) 144/67  (!) 154/64 (!) 142/55  Pulse: 79  83 83  Resp: (!) 21  (!) 21 14  Temp: (!) 97 F (36.1 C)  (!) 97 F (36.1 C) 97.9 F (36.6 C)  TempSrc:    Oral  SpO2: 95% 94% 94% 95%  Weight:      Height:         General: chronically ill appearing female in NAD Head: NCAT sclera not icteric MMM, +facial edema Neck: Supple.  Lungs: +crackles in bases, Breathing is unlabored on 3L O2 via [place] Heart: RRR. No murmur, rubs or gallops appreciated.  Abdomen: soft, nontender, +BS, no guarding, no rebound tenderness Lower extremities:1+ edema in upper and lower extremities Neuro: AAOx3. Moves all extremities spontaneously. Psych:  Responds to questions appropriately with a normal affect. Dialysis Access: PD cath in RLQ  Dialysis Orders:  [facility] 7x wk, 63.5kg, 5 exchanges, no day exchange, 2.25L fill volume total time 10hrs  Assessment/Plan: 1.  L femoral neck fracture - s/p ORIF today by ortho 2. Volume overload - CXR with interstitial pulmonary edema.  Plan to attempt UF tonight using at 4.25% bags.   3.  ESRD -  On PD 7days a week.  Orders written for PD tonight using all 4.25% bags  due to volume overload.  Ongoing UF failure.  Will likely need to d/c to SNF since she lives at home.  Will need to discuss transfer to HD vs home with hospice. Has refused to transfer back to HD in the past.  Current hospice patient 4.  Hypertension - Blood pressure well controlled. Cntinue home meds.  5.  Anemia of CKD - Hgb 8.5 - check iron studies.  Last Mircera 253mg given on 09/30/20.  Can redose Thursday if still admitted.   6.  Secondary Hyperparathyroidism -  Ca in goal.  Will check phos.  7.  Nutrition - Renal diet w/fluid restrictions.   LJen Mow PA-C [facility] 10/12/2020, 4:20 PM

## 2020-10-12 NOTE — Progress Notes (Signed)
Initial Nutrition Assessment  DOCUMENTATION CODES:   Obesity unspecified  INTERVENTION:   -Once diet is advanced, add:  -Nepro Shake po BID, each supplement provides 425 kcal and 19 grams protein -Renal MVI daily  NUTRITION DIAGNOSIS:   Increased nutrient needs related to post-op healing as evidenced by estimated needs.  GOAL:   Patient will meet greater than or equal to 90% of their needs  MONITOR:   PO intake,Supplement acceptance,Diet advancement,Labs,Weight trends,Skin,I & O's  REASON FOR ASSESSMENT:   Consult Assessment of nutrition requirement/status  ASSESSMENT:   80 y.o. female with medical history significant of  CAD, ESRD on PD, HTN, DM2, on O2 as needed history of CVA hypothyroidism NASH questional history of seizures in the past, sleep apnea, admitted for lt hip fracture.  Pt admitted with lt hip fracture.   Reviewed I/O's: -200 ml x 24 hours  UOP: 200 ml x 24 hours  Per orthopedics notes, plan for OR today. Pt is currently NPO for procedure.   Pt out of room at time of visit. No family or caregivers at bedside to provide additional history. Unable to obtain further nutrition-related history or complete nutrition-focused physical exam at this time.   Reviewed wt hx; noted no wt loss over the past year. However, pt may have fluid changes secondary to PD which may be masking true weight loss as well as fat and muscle depletions  Medications reviewed and include keppra and renvela.  Lab Results  Component Value Date   HGBA1C 6.6 (H) 10/12/2020   PTA DM medications are 5 mg glipizide daily, 5 mg linagliptin daily, and 14 units insulin glargine daily.   Labs reviewed: CBGS: 145-176 (inpatient orders for glycemic control are 0-6 units insulin aspart every 4 hours and 5 units insulin glargine daily at bedtime).   Diet Order:   Diet Order            Diet NPO time specified  Diet effective now                 EDUCATION NEEDS:   No education needs  have been identified at this time  Skin:  Skin Assessment: Reviewed RN Assessment  Last BM:  10/11/20  Height:   Ht Readings from Last 1 Encounters:  10/12/20 5' 1"  (1.549 m)    Weight:   Wt Readings from Last 1 Encounters:  10/12/20 74.7 kg    Ideal Body Weight:  47.7 kg  BMI:  Body mass index is 31.12 kg/m.  Estimated Nutritional Needs:   Kcal:  1500-1700  Protein:  75-90 grams  Fluid:  1000 ml + UOP    Loistine Chance, RD, LDN, [place] Registered Dietitian II Certified Diabetes Care and Education Specialist Please refer to [facility] for RD and/or RD on-call/weekend/after hours pager

## 2020-10-12 NOTE — ED Notes (Signed)
Report again attempted. No answer.

## 2020-10-12 NOTE — Anesthesia Procedure Notes (Signed)
Procedure Name: Intubation Date/Time: 10/12/2020 12:16 PM Performed by: Darletta Moll, CRNA Pre-anesthesia Checklist: Patient identified, Emergency Drugs available, Suction available and Patient being monitored Patient Re-evaluated:Patient Re-evaluated prior to induction Oxygen Delivery Method: Circle system utilized Preoxygenation: Pre-oxygenation with 100% oxygen Induction Type: IV induction Ventilation: Mask ventilation without difficulty Laryngoscope Size: Mac and 3 Grade View: Grade I Tube type: Oral Tube size: 7.0 mm Number of attempts: 1 Airway Equipment and Method: Stylet and Oral airway Placement Confirmation: ETT inserted through vocal cords under direct vision,  positive ETCO2 and breath sounds checked- equal and bilateral Secured at: 21 cm Tube secured with: Tape Dental Injury: Teeth and Oropharynx as per pre-operative assessment  Comments: Inserted by S. Sonia Baller.

## 2020-10-12 NOTE — Op Note (Addendum)
OPERATIVE REPORT 10/12/2020  1:44 PM  PATIENT:  Samantha Conrad  80 y.o. female  PRE-OPERATIVE DIAGNOSIS: IMPACTED LEFT FEMORAL NECK FRACTURE, MINIMALLY DISPLACED  POST-OPERATIVE DIAGNOSIS: IMPACTED LEFT FEMORAL NECK FRACTURE, MINIMALLY DISPLACED  PROCEDURE: CANNULATED SCREW FIXATION OF LEFT FEMORAL NECK FRACTURE WITH 8.0 MM BIOMET CANNULATED SCREWS WITH STAR DRIVE HEADS  SURGEON:  Surgeon(s) and Role:    Altamese Savanna, MD - Primary  PHYSICIAN ASSISTANT: 1. Ainsley Spinner, PA-C; 2. PA student  ANESTHESIA:   general  I/O:  Total I/O In: 450 [I.V.:350; IV Piggyback:100] Out: -   SPECIMEN:  No Specimen  TOURNIQUET: NONE  COMPLICATIONS: NONE  DICTATION: Note written in EPIC  DISPOSITION: TO PACU  CONDITION: STABLE  DELAY START OF DVT PROPHYLAXIS BECAUSE OF BLEEDING RISK: NO   BRIEF SUMMARY OF INDICATIONS FOR PROCEDURE:  Samantha Conrad is a very pleasant 80 y.o. who sustained a ground level fall resulting in hip pain, inability to bear weight. Subsequent x-rays demonstrated a comminuted valgus impacted femoral neck Fracture although the lateral suggested slight displacement. Given her complicated history with peritoneal dialysis and recent hospice, an extended conversation with the son and daughter as well as the patient was held. We did discuss the risks and benefits of surgical fixation with screws, hemiarthroplasty, and nonoperative treatment. Surgical risks include the possibility of avascular necrosis, nonunion, malunion, loss of fixation, need for conversion to total hip arthroplasty or other surgery, DVT, PE, heart attack, stroke, loss of motion, and multiple others including infection, and symptomatic hardware.  After full discussion of these risks and others, the patient and family wished to proceed.  DESCRIPTION OF PROCEDURE:  The patient was taken to the operating room where general anesthesia was induced.  Preoperative antibiotics  consisting of Ancef were administered. The patient was very carefully positioned on the radiolucent table with a bump under the pelvis on the side of the fracture. C-arm was brought in to confirm appropriate images and reduction. Standard prep and drape were then performed using chlorhexidine scrub, followed by Betadine scrub and paint.  C-arm was brought in to confirm the appropriate starting position for a 4 cm incision and checked on both AP and lateral planes. Because of her large body habitus and third space edema, multiple stab incisions were made in lieu of a single deep incision.  The guide pin for these screws was then placed inferior and central and advanced along the calcar into the femoral head checking it on 2 views. Two additional guide pins were then placed superior to this, one superior anterior and the other superior posterior. Began with the inferior screw, drilling the lateral cortex, then advancing the threads across the fracture site and engaging it, checking under C-arm for compression.  I did use a washer, and additionally 2 screws with washers were placed proximally achieving excellent fixation with inverted triangle pattern across the femoral neck into the head.  There was outstanding bite in the femoral head as well.  Wound was irrigated thoroughly.  I did split and spread the tensor and was able to repair this back with a simple 0 Vicryl sutures.  The deep subcu was repaired with #1 Vicryl and then a 2-0 Vicryl and a 3-0 nylon horizontal mattress for the skin layer.  Sterile gently compressive dressing was applied.  The patient was awakened from anesthesia and transported to the PACU in stable condition.  An assistant contributed with with rotation of the leg for visualization, as well as for closure.  PROGNOSIS:  Patient will be partial weightbearing on the left lower extremity with walker and will be allowed to proceed with discharge to home as soon cleared by PT.   Resume Plavix tonight and touchdown weightbearing for 6 weeks.  She is at risk for nonunion and avascular necrosis with hardware failure given poor bone quality and fracture pattern.  The patient and the family are aware of this.     [name]. Marcelino Scot, M.D.

## 2020-10-13 ENCOUNTER — Encounter (HOSPITAL_COMMUNITY): Payer: Self-pay | Admitting: Orthopedic Surgery

## 2020-10-13 DIAGNOSIS — K7581 Nonalcoholic steatohepatitis (NASH): Secondary | ICD-10-CM

## 2020-10-13 DIAGNOSIS — S72002S Fracture of unspecified part of neck of left femur, sequela: Secondary | ICD-10-CM

## 2020-10-13 LAB — GLUCOSE, CAPILLARY
Glucose-Capillary: 110 mg/dL — ABNORMAL HIGH (ref 70–99)
Glucose-Capillary: 113 mg/dL — ABNORMAL HIGH (ref 70–99)
Glucose-Capillary: 160 mg/dL — ABNORMAL HIGH (ref 70–99)
Glucose-Capillary: 248 mg/dL — ABNORMAL HIGH (ref 70–99)
Glucose-Capillary: 342 mg/dL — ABNORMAL HIGH (ref 70–99)
Glucose-Capillary: 343 mg/dL — ABNORMAL HIGH (ref 70–99)
Glucose-Capillary: 374 mg/dL — ABNORMAL HIGH (ref 70–99)
Glucose-Capillary: 408 mg/dL — ABNORMAL HIGH (ref 70–99)

## 2020-10-13 LAB — CBC
HCT: 29.3 % — ABNORMAL LOW (ref 36.0–46.0)
Hemoglobin: 8.4 g/dL — ABNORMAL LOW (ref 12.0–15.0)
MCH: 28.3 pg (ref 26.0–34.0)
MCHC: 28.7 g/dL — ABNORMAL LOW (ref 30.0–36.0)
MCV: 98.7 fL (ref 80.0–100.0)
Platelets: 74 10*3/uL — ABNORMAL LOW (ref 150–400)
RBC: 2.97 MIL/uL — ABNORMAL LOW (ref 3.87–5.11)
RDW: 17.9 % — ABNORMAL HIGH (ref 11.5–15.5)
WBC: 5.8 10*3/uL (ref 4.0–10.5)
nRBC: 0 % (ref 0.0–0.2)

## 2020-10-13 LAB — BASIC METABOLIC PANEL
Anion gap: 13 (ref 5–15)
BUN: 57 mg/dL — ABNORMAL HIGH (ref 8–23)
CO2: 21 mmol/L — ABNORMAL LOW (ref 22–32)
Calcium: 8.4 mg/dL — ABNORMAL LOW (ref 8.9–10.3)
Chloride: 98 mmol/L (ref 98–111)
Creatinine, Ser: 6.16 mg/dL — ABNORMAL HIGH (ref 0.44–1.00)
GFR, Estimated: 6 mL/min — ABNORMAL LOW (ref >60)
Glucose, Bld: 340 mg/dL — ABNORMAL HIGH (ref 70–99)
Potassium: 3.9 mmol/L (ref 3.5–5.1)
Sodium: 132 mmol/L — ABNORMAL LOW (ref 135–145)

## 2020-10-13 MED ORDER — ORAL CARE MOUTH RINSE
15.0000 mL | Freq: Two times a day (BID) | OROMUCOSAL | Status: DC
  Administered 2020-10-13 – 2020-10-14 (×2): 15 mL via OROMUCOSAL

## 2020-10-13 MED ORDER — DIPHENHYDRAMINE HCL 25 MG PO CAPS
25.0000 mg | ORAL_CAPSULE | Freq: Three times a day (TID) | ORAL | Status: DC | PRN
  Administered 2020-10-13: 25 mg via ORAL
  Filled 2020-10-13: qty 1

## 2020-10-13 MED ORDER — ACETAMINOPHEN 500 MG PO TABS
1000.0000 mg | ORAL_TABLET | Freq: Three times a day (TID) | ORAL | Status: DC
  Administered 2020-10-13 – 2020-10-14 (×2): 1000 mg via ORAL
  Filled 2020-10-13 (×3): qty 2

## 2020-10-13 NOTE — Evaluation (Signed)
Occupational Therapy Evaluation Patient Details Name: Samantha Conrad MRN: [id number] DOB: [birth year] Today's Date: 10/13/2020    History of Present Illness Samantha Conrad is a 80 y.o. female with medical history significant of  CAD, ESRD on PD, HTN, DM2, on O2 as needed history of CVA hypothyroidism NASH questional history of seizures in the past, sleep apnea.Falls today Left hip pain and left shoulder pain, found to have left femoral neck fx and now s/p CANNULATED SCREW FIXATION OF LEFT FEMORAL NECK FRACTURE 5/17   Clinical Impression   This 80 yo female admitted and underwent above presents to acute OT with needing setup/s-total A for basic ADLs and +2 for all mobility. She will continue to benefit from acute OT with follow up at SNF to get back to PLOF.      Follow Up Recommendations  SNF;Supervision/Assistance - 24 hour    Equipment Recommendations  Other (comment) (TBD next venue)       Precautions / Restrictions Precautions Precautions: Fall Restrictions Weight Bearing Restrictions: Yes LLE Weight Bearing: Partial weight bearing LLE Partial Weight Bearing Percentage or Pounds: 25%      Mobility Bed Mobility Overal bed mobility: Needs Assistance Bed Mobility: Supine to Sit     Supine to sit: Mod assist;+2 for physical assistance     General bed mobility comments: mod assist +2 for scooting to edge of bed and scooting in recliner. Assist with L LE to move in bed. Assist needed to raise trunk to seated position    Transfers Overall transfer level: Needs assistance Equipment used: Ambulation equipment used Transfers: Sit to/from Stand;Stand Pivot Transfers Sit to Stand: Min assist;+2 physical assistance;+2 safety/equipment Stand pivot transfers: Mod assist;+2 physical assistance       General transfer comment: Patient pivoted with +2 assist to [facility] then used stedy to perform sit to stand and get into recliner.    Balance Overall balance assessment: Needs  assistance Sitting-balance support: Feet supported Sitting balance-Leahy Scale: Fair     Standing balance support: Bilateral upper extremity supported;During functional activity Standing balance-Leahy Scale: Poor Standing balance comment: requires +2 assist and B UE support                           ADL either performed or assessed with clinical judgement   ADL Overall ADL's : Needs assistance/impaired Eating/Feeding: Independent;Bed level   Grooming: Set up;Sitting Grooming Details (indicate cue type and reason): in recliner Upper Body Bathing: Set up;Sitting Upper Body Bathing Details (indicate cue type and reason): in recliner Lower Body Bathing: Total assistance Lower Body Bathing Details (indicate cue type and reason): Mod A +2 sit<>stand Upper Body Dressing : Minimal assistance;Sitting Upper Body Dressing Details (indicate cue type and reason): in recliner Lower Body Dressing: Total assistance Lower Body Dressing Details (indicate cue type and reason): Mod A +2 sit<>stand Toilet Transfer: Moderate assistance;+2 for physical assistance;Squat-pivot or use of Celine Ahr Details (indicate cue type and reason): reaching for right arm of 3n1 with RUE and pivoting over Toileting- Clothing Manipulation and Hygiene: Total assistance Toileting - Clothing Manipulation Details (indicate cue type and reason): Mod A +2 sit<>stand             Vision Patient Visual Report: No change from baseline              Pertinent Vitals/Pain Pain Assessment: Faces Faces Pain Scale: Hurts even more Pain Location: L hip/leg Pain Descriptors / Indicators: Discomfort;Sore Pain  Intervention(s): Limited activity within patient's tolerance;Monitored during session;Repositioned;Premedicated before session        Extremity/Trunk Assessment Upper Extremity Assessment Upper Extremity Assessment: Generalized weakness   Lower Extremity Assessment Lower Extremity  Assessment: LLE deficits/detail LLE: Unable to fully assess due to pain LLE Coordination: decreased gross motor   Cervical / Trunk Assessment Cervical / Trunk Assessment: Normal   Communication Communication Communication: No difficulties   Cognition Arousal/Alertness: Awake/alert Behavior During Therapy: WFL for tasks assessed/performed Overall Cognitive Status: Within Functional Limits for tasks assessed                                        Exercises Total Joint Exercises Ankle Circles/Pumps: AROM;Both        Home Living Family/patient expects to be discharged to:: Skilled nursing facility Living Arrangements: Alone Available Help at Discharge: Family;Available PRN/intermittently Type of Home: Apartment Home Access: Level entry     Home Layout: One level     Bathroom Shower/Tub: Teacher, early years/pre: Standard Bathroom Accessibility: Yes   Home Equipment: Tub bench;Cane - single point;Walker - 4 wheels   Additional Comments: Daughter lives in same apartment complex      Prior Functioning/Environment Level of Independence: Needs assistance  Gait / Transfers Assistance Needed: Furniture surfs in home without DME; rollator for community ambulation. Manages peritoneal dialysis at home ADL's / Homemaking Assistance Needed: Pt sponges bathes at sink (not comfortable stepping over tub). Daughter lives in same apt complex and assist with household tasks as needed. Son assists with medication management, finances, transportation            OT Problem List: Decreased strength;Decreased range of motion;Impaired balance (sitting and/or standing);Pain;Decreased activity tolerance      OT Treatment/Interventions: Self-care/ADL training;DME and/or AE instruction;Patient/family education;Balance training    OT Goals(Current goals can be found in the care plan section) Acute Rehab OT Goals Patient Stated Goal: to decrease pain and be able to  move on my own OT Goal Formulation: With patient Time For Goal Achievement: 10/27/20 Potential to Achieve Goals: Good  OT Frequency: Min 2X/week           Co-evaluation PT/OT/SLP Co-Evaluation/Treatment: Yes Reason for Co-Treatment: For patient/therapist safety;To address functional/ADL transfers PT goals addressed during session: Mobility/safety with mobility;Balance;Strengthening/ROM OT goals addressed during session: Strengthening/ROM;ADL's and self-care      AM-PAC OT "6 Clicks" Daily Activity     Outcome Measure Help from another person eating meals?: None Help from another person taking care of personal grooming?: A Little Help from another person toileting, which includes using toliet, bedpan, or urinal?: A Lot Help from another person bathing (including washing, rinsing, drying)?: A Lot Help from another person to put on and taking off regular upper body clothing?: A Little Help from another person to put on and taking off regular lower body clothing?: A Lot 6 Click Score: 16   End of Session Equipment Utilized During Treatment: Gait belt Nurse Communication: Mobility status (use or sara stedy)  Activity Tolerance: Patient limited by pain Patient left: in chair;with call bell/phone within reach  OT Visit Diagnosis: Unsteadiness on feet (R26.81);Other abnormalities of gait and mobility (R26.89);Muscle weakness (generalized) (M62.81);Pain Pain - Right/Left: Left Pain - part of body: Leg                Time: 4098-1191 OT Time Calculation (min): 27 min Charges:  OT General  Charges $OT Visit: 1 Visit OT Evaluation $OT Eval Moderate Complexity: 1 Mod  [address], OTR/L Acute [employer] Pager [phone] Office [phone]     Almon Register 10/13/2020, 12:13 PM

## 2020-10-13 NOTE — Plan of Care (Signed)
  Problem: Education: Goal: Knowledge of General Education information will improve Description: Including pain rating scale, medication(s)/side effects and non-pharmacologic comfort measures Outcome: Progressing   Problem: Health Behavior/Discharge Planning: Goal: Ability to manage health-related needs will improve Outcome: Progressing   Problem: Clinical Measurements: Goal: Ability to maintain clinical measurements within normal limits will improve Outcome: Progressing   Problem: Activity: Goal: Risk for activity intolerance will decrease Outcome: Progressing   Problem: Nutrition: Goal: Adequate nutrition will be maintained Outcome: Progressing   Problem: Coping: Goal: Level of anxiety will decrease Outcome: Progressing   Problem: Pain Managment: Goal: General experience of comfort will improve Outcome: Progressing

## 2020-10-13 NOTE — Progress Notes (Signed)
Inpatient Diabetes Program Recommendations  AACE/ADA: New Consensus Statement on Inpatient Glycemic Control (2015)  Target Ranges:  Prepandial:   less than 140 mg/dL      Peak postprandial:   less than 180 mg/dL (1-2 hours)      Critically ill patients:  140 - 180 mg/dL   Lab Results  Component Value Date   GLUCAP 342 (H) 10/13/2020   HGBA1C 6.6 (H) 10/12/2020    Review of Glycemic Control Results for Samantha Conrad, Samantha Conrad (MRN [id number]) as of 10/13/2020 11:19  Ref. Range 10/12/2020 19:56 10/12/2020 23:51 10/13/2020 04:01 10/13/2020 08:40 10/13/2020 09:03  Glucose-Capillary Latest Ref Range: 70 - 99 mg/dL 247 (H) 329 (H) 374 (H) 343 (H) 342 (H)   Inpatient Diabetes Program Recommendations:   -Consider increase in Lantus to 10 units daily Secure chat sent to Dr. Avon Gully.  Thank you, Nani Gasser. Rakhi Romagnoli, RN, MSN, CDE  Diabetes Coordinator Inpatient Glycemic Control Team Team Pager [phone] (8am-5pm) 10/13/2020 11:20 AM

## 2020-10-13 NOTE — Progress Notes (Signed)
[facility] Progress Note   Subjective:   Patient seen and examined at bedside.  Pain in hip mostly well controlled for now.  Breathing better today but continues to have orthopnea.  Denies CP, n/v/d and abdominal pain.  Reports tolerating PD well overnight.  UF 2.4L.    Objective Vitals:   10/12/20 1958 10/13/20 0333 10/13/20 0754 10/13/20 1247  BP: (!) 112/40 114/66 (!) 131/57 (!) 124/56  Pulse: 76 72 77 81  Resp: 19 18    Temp: 97.6 F (36.4 C) (!) 97.5 F (36.4 C) (!) 97.5 F (36.4 C) 98.4 F (36.9 C)  TempSrc: Oral Oral Oral Oral  SpO2: 95% 97% 99% 98%  Weight:   78.9 kg   Height:       Physical Exam General:chronically ill appearing female with facial edema, sitting up in bed Heart:RRR Lungs:mostly CTAB, nml WOB on 2L via [place] Abdomen:soft, NTND Extremities:1+ Upper and lower extremity edema Dialysis Access: PD cath in RLQ c/d/i  [facility] Weights   10/12/20 1027 10/12/20 1746 10/13/20 0754  Weight: 74.7 kg 79 kg 78.9 kg    Intake/Output Summary (Last 24 hours) at 10/13/2020 1327 Last data filed at 10/13/2020 0900 Gross per 24 hour  Intake 830 ml  Output 26 ml  Net 804 ml    Additional Objective Labs: Basic Metabolic Panel: Recent Labs  Lab 10/11/20 2001 10/12/20 0122 10/12/20 0140 10/13/20 0155  NA 135  --  136 132*  K 3.8  --  4.0 3.9  CL 96*  --  100 98  CO2 25  --  21* 21*  GLUCOSE 104*  --  144* 340*  BUN 51*  --  52* 57*  CREATININE 6.13*  --  5.94* 6.16*  CALCIUM 8.8*  --  8.3* 8.4*  PHOS  --  3.2  --   --    Liver Function Tests: Recent Labs  Lab 10/12/20 0122  AST 22  ALT 20  ALKPHOS 112  BILITOT 0.5  PROT 5.2*  ALBUMIN 2.3*   CBC: Recent Labs  Lab 10/11/20 2001 10/12/20 0140 10/13/20 0155  WBC 7.0 7.1 5.8  NEUTROABS 5.0  --   --   HGB 9.4* 8.5* 8.4*  HCT 32.3* 28.7* 29.3*  MCV 97.9 98.3 98.7  PLT 82* 68* 74*    CBG: Recent Labs  Lab 10/13/20 0401 10/13/20 0620 10/13/20 0840 10/13/20 0903  10/13/20 1203  GLUCAP 374* 408* 343* 342* 160*   Studies/Results: CT HEAD WO CONTRAST  Result Date: 10/11/2020 CLINICAL DATA:  Fall with head trauma EXAM: CT HEAD WITHOUT CONTRAST CT CERVICAL SPINE WITHOUT CONTRAST TECHNIQUE: Multidetector CT imaging of the head and cervical spine was performed following the standard protocol without intravenous contrast. Multiplanar CT image reconstructions of the cervical spine were also generated. COMPARISON:  August 10, 2020 FINDINGS: CT HEAD FINDINGS Brain: No evidence of acute large vascular territory infarction, hemorrhage, hydrocephalus, extra-axial collection or mass lesion/mass effect. Age related global parenchymal volume loss with ex vacuo dilatation of ventricular system. Stable burden of chronic microvascular disease ischemic disease. Encephalomalacia in left occipital lobe with ex vacuo dilatation of the occipital horn of the lateral ventricle, sequela of prior insult. Mineralization of bilateral basal ganglia. Posterior fossa arachnoid cyst. Vascular: No hyperdense vessel. Atherosclerotic calcifications of the internal carotid arteries at skull base. Skull: Hyperostosis interna.  Negative for fracture or focal lesion. Sinuses/Orbits: The visualized paranasal sinuses and mastoid air cells are predominantly clear. Other: None CT CERVICAL SPINE FINDINGS Alignment: Preservation  of the normal cervical lordosis. Grade 1 degenerative anterolisthesis of C3 on C4 and C6 on C7. No evidence of traumatic listhesis. Skull base and vertebrae: No acute fracture. No primary bone lesion or focal pathologic process. Soft tissues and spinal canal: No prevertebral fluid or swelling. No visible canal hematoma. Disc levels: Lower cervical predominant multilevel degenerative change of the cervical spine with disc space narrowing osteophytosis and uncovertebral/facet hypertrophy. Upper chest: Small left greater than right layering pleural effusions with adjacent compressive  atelectasis. Other: None IMPRESSION: 1. No evidence of acute intracranial pathology. 2. No evidence of acute fracture or traumatic listhesis of the cervical spine. 3. Lower cervical predominant multilevel degenerative change of the cervical spine. 4. Small left greater than right layering pleural effusions with adjacent compressive atelectasis. Electronically Signed   By: Dahlia Bailiff MD   On: 10/11/2020 21:54   CT CERVICAL SPINE WO CONTRAST  Result Date: 10/11/2020 CLINICAL DATA:  Fall with head trauma EXAM: CT HEAD WITHOUT CONTRAST CT CERVICAL SPINE WITHOUT CONTRAST TECHNIQUE: Multidetector CT imaging of the head and cervical spine was performed following the standard protocol without intravenous contrast. Multiplanar CT image reconstructions of the cervical spine were also generated. COMPARISON:  August 10, 2020 FINDINGS: CT HEAD FINDINGS Brain: No evidence of acute large vascular territory infarction, hemorrhage, hydrocephalus, extra-axial collection or mass lesion/mass effect. Age related global parenchymal volume loss with ex vacuo dilatation of ventricular system. Stable burden of chronic microvascular disease ischemic disease. Encephalomalacia in left occipital lobe with ex vacuo dilatation of the occipital horn of the lateral ventricle, sequela of prior insult. Mineralization of bilateral basal ganglia. Posterior fossa arachnoid cyst. Vascular: No hyperdense vessel. Atherosclerotic calcifications of the internal carotid arteries at skull base. Skull: Hyperostosis interna.  Negative for fracture or focal lesion. Sinuses/Orbits: The visualized paranasal sinuses and mastoid air cells are predominantly clear. Other: None CT CERVICAL SPINE FINDINGS Alignment: Preservation of the normal cervical lordosis. Grade 1 degenerative anterolisthesis of C3 on C4 and C6 on C7. No evidence of traumatic listhesis. Skull base and vertebrae: No acute fracture. No primary bone lesion or focal pathologic process. Soft  tissues and spinal canal: No prevertebral fluid or swelling. No visible canal hematoma. Disc levels: Lower cervical predominant multilevel degenerative change of the cervical spine with disc space narrowing osteophytosis and uncovertebral/facet hypertrophy. Upper chest: Small left greater than right layering pleural effusions with adjacent compressive atelectasis. Other: None IMPRESSION: 1. No evidence of acute intracranial pathology. 2. No evidence of acute fracture or traumatic listhesis of the cervical spine. 3. Lower cervical predominant multilevel degenerative change of the cervical spine. 4. Small left greater than right layering pleural effusions with adjacent compressive atelectasis. Electronically Signed   By: Dahlia Bailiff MD   On: 10/11/2020 21:54   DG Chest Port 1 View  Result Date: 10/11/2020 CLINICAL DATA:  Fall 2 hours ago EXAM: PORTABLE CHEST 1 VIEW COMPARISON:  08/10/2020 FINDINGS: Mild cardiomegaly with suspected trace pleural effusions. Vascular congestion and mild diffuse interstitial process likely edema. Patchy atelectasis left base. Aortic atherosclerosis. No pneumothorax. IMPRESSION: 1. Cardiomegaly with probable small pleural effusions. Vascular congestion and mild interstitial pulmonary edema 2. Patchy atelectasis or pneumonia left base Electronically Signed   By: Donavan Foil M.D.   On: 10/11/2020 20:27   DG C-Arm 1-60 Min-No Report  Result Date: 10/12/2020 Fluoroscopy was utilized by the requesting physician.  No radiographic interpretation.   DG HIP PORT UNILAT WITH PELVIS 1V LEFT  Result Date: 10/12/2020 CLINICAL DATA:  Left  leg pain EXAM: DG HIP (WITH OR WITHOUT PELVIS) 1V PORT LEFT COMPARISON:  Films from earlier in the same day as well as 10/11/2020 FINDINGS: Pelvic ring is intact. Three fixation screws are noted traversing the proximal left femur. No acute bony or soft tissue abnormality is noted. IMPRESSION: Screw fixation of the proximal left femur. Electronically  Signed   By: Inez Catalina M.D.   On: 10/12/2020 21:21   DG Hip Port Unilat With Pelvis 1V Left  Result Date: 10/12/2020 CLINICAL DATA:  Status post surgical internal fixation of left hip fracture. EXAM: DG HIP (WITH OR WITHOUT PELVIS) 1V PORT LEFT COMPARISON:  Fluoroscopic images of same day. FINDINGS: Status post surgical internal fixation of proximal left femoral fracture with 3 surgical screws. Good alignment of fracture components is noted. IMPRESSION: Status post surgical internal fixation of proximal left femoral neck fracture. Electronically Signed   By: Marijo Conception M.D.   On: 10/12/2020 16:28   DG HIP OPERATIVE UNILAT WITH PELVIS LEFT  Result Date: 10/12/2020 CLINICAL DATA:  Fixation for fracture EXAM: OPERATIVE LEFT HIP   2 VIEWS TECHNIQUE: Fluoroscopic spot image(s) were submitted for interpretation post-operatively. COMPARISON:  Preoperative pelvis and left hip radiographs Oct 11, 2020 FLUOROSCOPY TIME:  76.5 seconds; 27.28 mGy; 4 acquired images FINDINGS: Frontal and lateral views obtained. There are 3 screws transfixing a fracture of the subcapital femoral neck fracture with alignment fracture site essentially anatomic. Screw tips are in the proximal femoral head. No new fracture. No dislocation. Mild narrowing left hip joint. IMPRESSION: Screws transfix a fracture of the subcapital femoral neck region with screw tips in proximal femoral head. Alignment anatomic at fracture site. No new fracture. No dislocation. Narrowing left hip joint. Electronically Signed   By: Lowella Grip III M.D.   On: 10/12/2020 15:07   DG Hip Unilat With Pelvis 2-3 Views Left  Result Date: 10/11/2020 CLINICAL DATA:  Fall EXAM: DG HIP (WITH OR WITHOUT PELVIS) 2-3V LEFT COMPARISON:  CT 01/22/2019 FINDINGS: SI joints are non widened. Pubic symphysis and rami appear intact. Acute mildly displaced subcapital left femoral neck fracture. No femoral head dislocation IMPRESSION: Acute minimally displaced left  femoral neck fracture Electronically Signed   By: Donavan Foil M.D.   On: 10/11/2020 20:28    Medications: . sodium chloride 10 mL/hr at 10/12/20 1204  . dialysis solution 4.25% low-MG/low-CA    . methocarbamol (ROBAXIN) IV     . amLODipine  5 mg Oral QHS  . atorvastatin  40 mg Oral q1800  . carvedilol  3.125 mg Oral BID WC  . clopidogrel  75 mg Oral Daily  . docusate sodium  100 mg Oral BID  . feeding supplement (NEPRO CARB STEADY)  237 mL Oral BID BM  . gentamicin cream  1 application Topical Daily  . insulin aspart  0-6 Units Subcutaneous TID WC  . insulin glargine  5 Units Subcutaneous QHS  . levETIRAcetam  250 mg Oral BID WC  . levothyroxine  100 mcg Oral QAC breakfast  . mouth rinse  15 mL Mouth Rinse BID  . multivitamin  1 tablet Oral QHS  . pantoprazole  40 mg Oral Q breakfast  . sevelamer carbonate  800 mg Oral TID WC  . traZODone  50 mg Oral QHS    Dialysis Orders: CCPD 7x wk, 63.5kg, 5 exchanges, no day exchange, 2.25L fill volume total time 10hrs  Assessment/Plan: 1.  L femoral neck fracture - s/p ORIF today by ortho 2. Volume overload -  CXR with interstitial pulmonary edema.  UF 2.4L last night.  Continue to use 4.25% bags to improve volume.  3.  ESRD -  On PD 7days a week.  Orders written for PD tonight using all 4.25% bags due to volume overload.  Ongoing UF failure.  Will likely need to d/c to SNF since she lives at home.  Will need to discuss transfer to HD vs home with hospice. Has refused to transfer back to HD in the past. Discussions ongoing. Continue PD for now. 4.  Hypertension - Blood pressure well controlled. Continue home meds.  5.  Anemia of CKD - Hgb 8.4 - check iron studies.  Last Mircera 282mg given on 09/30/20.  Can redose Thursday if still admitted - will d/c if transfers to full comfort care.    6.  Secondary Hyperparathyroidism -  Ca and phos in goal.  7.  Nutrition - Renal diet w/fluid restrictions.    LJen Mow PA-C [facility] 10/13/2020,1:27 PM  LOS: 2 days

## 2020-10-13 NOTE — Progress Notes (Signed)
Daily Progress Note   Patient Name: Samantha Conrad       Date: 10/13/2020 DOB: 05-02-1941  Age: 80 y.o. MRN#: [id number] Attending Physician: Little Ishikawa, MD Primary Care Physician: Physicians, Di Kindle Family Admit Date: 10/11/2020  Reason for Consultation/Follow-up: Disposition and Establishing goals of care  Subjective:     The patient is awake and pleasantly conversational upon entering. States pain is doing ok. She has her son [name] on the phone and we had a discussion about goals and what is important to the patient.  She reminded [place] that her peritoneal dialysis is failing.  She worries if she goes to rehab that she may not "make it out."  She states multiple times that "I am tired" and specifically cites medical procedures, dialysis. She confirms she absolutely doesn't want hemodialysis at all which we discuss limits her ability to go to a rehab facility.  She discusses a general desire for comfort, dignity, and peace.    She prefers to stop unnecessary medical interventions like lab work, therapy, peritoneal dialysis.  She wants to shift toward comfort care with a goal for d/c to hospice facility in [place], [place] (near her family) with final decision/referral to be made 5/19 on follow-up. She states she does not want to die at home.  Her son states that this is difficult but he fully supports her decisions. She verbalizes that she is at peace with her decisions.  Review of Systems  Constitutional: Positive for activity change (related to recent hip fracture) and fatigue.  Musculoskeletal:       Hip pain controlled  Neurological: Positive for weakness (progressive over time).    Patient Current Code Status:   Code Status: DNR   Length of Stay: 2 days  Current Medications: Scheduled Meds:  . acetaminophen  1,000 mg Oral Q8H  . amLODipine  5 mg Oral QHS  . carvedilol  3.125 mg Oral BID WC  . clopidogrel  75 mg Oral Daily  . docusate sodium  100 mg Oral BID  .  feeding supplement (NEPRO CARB STEADY)  237 mL Oral BID BM  . gentamicin cream  1 application Topical Daily  . insulin aspart  0-6 Units Subcutaneous TID WC  . insulin glargine  5 Units Subcutaneous QHS  . levETIRAcetam  250 mg Oral BID WC  . levothyroxine  100 mcg Oral QAC breakfast  . mouth rinse  15 mL Mouth Rinse BID  . pantoprazole  40 mg Oral Q breakfast  . sevelamer carbonate  800 mg Oral TID WC  . traZODone  50 mg Oral QHS    Continuous Infusions: . sodium chloride 10 mL/hr at 10/12/20 1204  . dialysis solution 4.25% low-MG/low-CA    . methocarbamol (ROBAXIN) IV      PRN Meds: diphenhydrAMINE, dianeal solution for CAPD/CCPD with heparin, HYDROcodone-acetaminophen, menthol-cetylpyridinium **OR** phenol, methocarbamol **OR** methocarbamol (ROBAXIN) IV, metoCLOPramide **OR** metoCLOPramide (REGLAN) injection, morphine injection, ondansetron (ZOFRAN) IV, ondansetron **OR** ondansetron (ZOFRAN) IV, polyethylene glycol  Palliative Performance Scale:      Vital Signs: BP (!) 124/56 (BP Location: Left Arm)   Pulse 81   Temp 98.4 F (36.9 C) (Oral)   Resp 18   Ht 5' 1"  (1.549 m)   Wt 78.9 kg   SpO2 98%   BMI 32.87 kg/m  SpO2: SpO2: 98 % O2 Device: O2 Device: Nasal Cannula O2 Flow Rate: O2 Flow Rate (L/min): 2 L/min  Intake/output summary:   Intake/Output Summary (Last 24 hours) at 10/13/2020  [place] filed at 10/13/2020 0900 Gross per 24 hour  Intake 480 ml  Output --  Net 480 ml   LBM: Last BM Date: 10/11/20 Baseline Weight: Weight: 74.7 kg Most recent weight: Weight: 78.9 kg  Physical Exam:  Physical Exam Constitutional:      General: She is awake. She is not in acute distress.    Appearance: Normal appearance. She is not toxic-appearing.  Neurological:     Mental Status: She is alert.  Psychiatric:        Behavior: Behavior is cooperative.     Additional Data Reviewed: Recent Labs    10/12/20 0140 10/13/20 0155  WBC 7.1 5.8  HGB 8.5* 8.4*   PLT 68* 74*  NA 136 132*  BUN 52* 57*  CREATININE 5.94* 6.16*     Problem List:  Patient Active Problem List   Diagnosis Date Noted  . Closed left hip fracture ([place]) 10/11/2020  . Goals of care, counseling/discussion   . Palliative care by specialist   . Shortness of breath   . NSTEMI (non-ST elevated myocardial infarction) ([place]) 08/11/2020  . CAP (community acquired pneumonia) 08/11/2020  . DM (diabetes mellitus), type 2 with renal complications ([place]) 26/94/8546  . PNA (pneumonia) 03/19/2020  . Peritonitis ([place])   . Spontaneous bacterial peritonitis ([place]) 09/13/2018  . CAD S/P percutaneous coronary angioplasty   . Anemia   . Thrombocytopenia ([place])   . History of ST elevation myocardial infarction (STEMI) 09/06/2018  . Symptomatic anemia 03/11/2017  . Diet-controlled diabetes mellitus ([place]) 03/11/2017  . NASH (nonalcoholic steatohepatitis) 03/11/2017  . Essential hypertension 03/11/2017  . Hypothyroidism 03/11/2017  . ESRD (end stage renal disease) on dialysis ([place]) 03/11/2017  . Seizure disorder 03/11/2017  . Abdominal pain 03/11/2017  . Dyslipidemia, goal LDL below 70 03/11/2017  . Chronic pancreatitis ([place]) 03/11/2017  . Constipation 03/11/2017  . Laryngopharyngeal reflux (LPR) 02/17/2015  . H/O Mild allergic rhinitis 02/17/2015  . Cirrhosis ([place]) 02/17/2015     Palliative Care Assessment & Plan    Code Status:  DNR  Goals of Care:  Shift toward comfort care  Desire for further Chaplaincy support:yes  3. Symptom Management:  Added scheduled Tylenol for pain  Already with prn hydrocodone/APAP, morphine, robaxin  4. Palliative Prophylaxis:  Stool [place]: MiraLAX already ordered prn  5. Prognosis: < 2 weeks  5. Discharge Planning: Hospice facility   Care plan was discussed with the patient, her son [name] (who will communicate with the daughter Rona Ravens)  Thank you for allowing the Palliative Medicine Team to assist in the care of this  patient.   Time In: 11:15 Time Out: 12:00 Total Time 45 min Prolonged Time Billed  no     Greater than 50%  of this time was spent counseling and coordinating care related to the above assessment and plan.  Wadie Lessen NP

## 2020-10-13 NOTE — Evaluation (Signed)
Physical Therapy Evaluation Patient Details Name: Samantha Conrad MRN: [id number] DOB: 05-28-1941 Today's Date: 10/13/2020   History of Present Illness  Samantha Conrad is a 80 y.o. female with medical history significant of  CAD, ESRD on PD, HTN, DM2, on O2 as needed history of CVA hypothyroidism NASH questional history of seizures in the past, sleep apnea.Falls today Left hip pain and left shoulder pain, found to have left femoral neck fx and now s/p CANNULATED SCREW FIXATION OF LEFT FEMORAL NECK FRACTURE 5/17    Clinical Impression  Patient received in bed, she is agreeable to PT. Patient is pain limited, but gives good effort. She requires mod+2 assist for bed mobility and min/mod assist for transfers. Patient was able to pivot to bsc with +2 mod assist and then sit to stand with stedy with +2 min assist. Patient will continue to benefit from skilled PT while here to improve functional independence and safety.     Follow Up Recommendations SNF    Equipment Recommendations  None recommended by PT;Other (comment) (TBD)    Recommendations for Other Services       Precautions / Restrictions Precautions Precautions: Fall Restrictions Weight Bearing Restrictions: Yes LLE Weight Bearing: Partial weight bearing LLE Partial Weight Bearing Percentage or Pounds: 25%      Mobility  Bed Mobility Overal bed mobility: Needs Assistance Bed Mobility: Supine to Sit     Supine to sit: Mod assist;+2 for physical assistance     General bed mobility comments: mod assist +2 for scooting to edge of bed and scooting in recliner. Assist with L LE to move in bed. Assist needed to raise trunk to seated position    Transfers Overall transfer level: Needs assistance Equipment used: Ambulation equipment used Transfers: Sit to/from Stand;Stand Pivot Transfers Sit to Stand: Min assist;+2 physical assistance;+2 safety/equipment Stand pivot transfers: Mod assist;+2 physical assistance        General transfer comment: Patient pivoted with +2 assist to [facility] then used stedy to perform sit to stand and get into recliner.  Ambulation/Gait             General Gait Details: unable due to pain and limited WB on left LE  Stairs            Wheelchair Mobility    Modified Rankin (Stroke Patients Only)       Balance Overall balance assessment: Needs assistance Sitting-balance support: Feet supported Sitting balance-Leahy Scale: Fair     Standing balance support: Bilateral upper extremity supported;During functional activity Standing balance-Leahy Scale: Poor Standing balance comment: requires +2 assist and B UE support                             Pertinent Vitals/Pain Pain Assessment: Faces Faces Pain Scale: Hurts even more Pain Location: L hip/leg Pain Descriptors / Indicators: Discomfort Pain Intervention(s): Monitored during session;Premedicated before session;Repositioned    Home Living Family/patient expects to be discharged to:: Skilled nursing facility Living Arrangements: Alone Available Help at Discharge: Family;Available PRN/intermittently Type of Home: Apartment Home Access: Level entry     Home Layout: One level Home Equipment: Tub bench;Cane - single point;Walker - 4 wheels Additional Comments: Daughter lives in same apartment complex    Prior Function Level of Independence: Needs assistance   Gait / Transfers Assistance Needed: Furniture surfs in home without DME; rollator for community ambulation. Manages peritoneal dialysis at home  ADL's / Homemaking Assistance Needed: Pt sponges  bathes at sink (not comfortable stepping over tub). Daughter lives in same apt complex and assist with household tasks as needed. Son assists with medication management, finances, transportation        Hand Dominance        Extremity/Trunk Assessment   Upper Extremity Assessment Upper Extremity Assessment: Defer to OT evaluation    Lower  Extremity Assessment Lower Extremity Assessment: LLE deficits/detail LLE: Unable to fully assess due to pain LLE Coordination: decreased gross motor    Cervical / Trunk Assessment Cervical / Trunk Assessment: Normal  Communication   Communication: No difficulties  Cognition Arousal/Alertness: Awake/alert Behavior During Therapy: WFL for tasks assessed/performed Overall Cognitive Status: Within Functional Limits for tasks assessed                                        General Comments      Exercises Total Joint Exercises Ankle Circles/Pumps: AROM;Both   Assessment/Plan    PT Assessment Patient needs continued PT services  PT Problem List Decreased strength;Decreased mobility;Decreased activity tolerance;Decreased balance;Pain;Decreased knowledge of precautions;Decreased range of motion;Cardiopulmonary status limiting activity;Obesity       PT Treatment Interventions DME instruction;Therapeutic exercise;Gait training;Balance training;Stair training;Functional mobility training;Therapeutic activities;Patient/family education    PT Goals (Current goals can be found in the Care Plan section)  Acute Rehab PT Goals Patient Stated Goal: to decrease pain PT Goal Formulation: With patient Time For Goal Achievement: 10/26/20 Potential to Achieve Goals: Fair    Frequency Min 3X/week   Barriers to discharge Decreased caregiver support      Co-evaluation               AM-PAC PT "6 Clicks" Mobility  Outcome Measure Help needed turning from your back to your side while in a flat bed without using bedrails?: A Lot Help needed moving from lying on your back to sitting on the side of a flat bed without using bedrails?: A Lot Help needed moving to and from a bed to a chair (including a wheelchair)?: A Lot Help needed standing up from a chair using your arms (e.g., wheelchair or bedside chair)?: A Lot Help needed to walk in hospital room?: Total Help needed  climbing 3-5 steps with a railing? : Total 6 Click Score: 10    End of Session Equipment Utilized During Treatment: Gait belt;Oxygen Activity Tolerance: Patient limited by pain;Patient limited by lethargy Patient left: in chair;with call bell/phone within reach Nurse Communication: Mobility status PT Visit Diagnosis: Unsteadiness on feet (R26.81);Other abnormalities of gait and mobility (R26.89);Muscle weakness (generalized) (M62.81);Pain;History of falling (Z91.81) Pain - Right/Left: Left Pain - part of body: Leg    Time: 1110-1143 PT Time Calculation (min) (ACUTE ONLY): 33 min   Charges:   PT Evaluation $PT Eval Moderate Complexity: 1 Mod PT Treatments $Therapeutic Activity: 8-22 mins        Agnes Brightbill, PT, GCS 10/13/20,11:59 AM

## 2020-10-13 NOTE — Evaluation (Signed)
Clinical/Bedside Swallow Evaluation Patient Details  Name: Samantha Conrad MRN: [id number] Date of Birth: Sep 08, 1940  Today's Date: 10/13/2020 Time: SLP Start Time (ACUTE ONLY): 0845 SLP Stop Time (ACUTE ONLY): 0915 SLP Time Calculation (min) (ACUTE ONLY): 30 min  Past Medical History:  Past Medical History:  Diagnosis Date  . Anemia   . Basal cell carcinoma     on nose/face  . CAD (coronary artery disease)    a. 08/2018: posterior STEMI s/p DES to LCx x2  . Depression   . ESRD on peritoneal dialysis ([place])    "q night" (11/29/2017)  . Fatty liver   . Hypertension   . Hypothyroidism   . NASH (nonalcoholic steatohepatitis)   . Seizures ([place])    ? when she may have had a stroke sometime in the spring of 2018  . Sleep apnea    diagnosed years ago "after I'd gained alot of weight;  no longer a problem since weight loss"  . Stroke [facility]) 04/2016   memory issues, lost 1/2 vision in both eyes  . Thrombocytopenia ([place])   . Thyroid disease   . Type II diabetes mellitus ([place])    Past Surgical History:  Past Surgical History:  Procedure Laterality Date  . ABDOMINAL HYSTERECTOMY    . AV FISTULA PLACEMENT Right 10/30/2016   Procedure: INSERTION OF ARTERIOVENOUS (AV) GORE-TEX GRAFT RIGHT UPPER ARM;  Surgeon: Elam Dutch, MD;  Location: [place];  Service: Vascular;  Laterality: Right;  . CATARACT EXTRACTION W/ INTRAOCULAR LENS  IMPLANT, BILATERAL Bilateral   . CHOLECYSTECTOMY OPEN    . CORNEAL TRANSPLANT Bilateral   . CORONARY ANGIOGRAPHY N/A 09/06/2018   Procedure: CORONARY ANGIOGRAPHY;  Surgeon: Lorretta Harp, MD;  Location: [place] [facility];  Service: Cardiovascular;  Laterality: N/A;  . CORONARY/GRAFT ACUTE MI REVASCULARIZATION N/A 09/06/2018   Procedure: Coronary/Graft Acute MI Revascularization;  Surgeon: Lorretta Harp, MD;  Location: [place] [facility];  Service: Cardiovascular;  Laterality: N/A;  . EYE SURGERY    . KNEE ARTHROSCOPY Left   . LEFT HEART CATH AND CORONARY  ANGIOGRAPHY N/A 09/06/2018   Procedure: LEFT HEART CATH AND CORONARY ANGIOGRAPHY;  Surgeon: Lorretta Harp, MD;  Location: [place] [facility];  Service: Cardiovascular;  Laterality: N/A;  . MOHS SURGERY     "nose"  . SHOULDER OPEN ROTATOR CUFF REPAIR Left   . THROMBECTOMY AND REVISION OF ARTERIOVENTOUS (AV) GORETEX  GRAFT Right 12/04/2016   Procedure: THROMBECTOMY/ REVISION OF RIGHT UPPER ARM ARTERIOVENOUS GORETEX GRAFT;  Surgeon: Elam Dutch, MD;  Location: [place];  Service: Vascular;  Laterality: Right;  . TONSILLECTOMY     HPI:  80yo female admitted 10/11/20 after a fall at home. 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   Assessment / Plan / Recommendation Clinical Impression  Pt seen at bedside for assessment of swallow function and safety. RN was present and provided whole meds with water. Pt exhibited no difficulty with swallowing all pills at one time. CN exam unremarkable. Her tongue appears bruised, but she does not recall biting it when she fell. Pt tolerated trials of thin liquids, puree, and solid textures without obvious oral issues or overt s/s aspiration. She does not report difficulty swallowing. Will continue current (regular) diet and thin liquids. No further ST intervention recommended at this time. Please reconsult if needs arise.   SLP Visit Diagnosis: Dysphagia, unspecified (R13.10)  Aspiration Risk  No limitations    Diet Recommendation Regular;Thin liquid   Liquid Administration via: Cup;Straw Medication Administration: Whole meds with liquid Supervision: Patient able to self feed Compensations: Slow rate;Small sips/bites Postural Changes: Seated upright at 90 degrees;Remain upright for at least 30 minutes after po intake    Other  Recommendations Oral Care Recommendations: Oral care BID   Follow up Recommendations None          Prognosis Prognosis for Safe Diet  Advancement: Good      Swallow Study   General Date of Onset: 10/11/20 HPI: 80yo female admitted 10/11/20 after a fall at home. 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 Type of Study: Bedside Swallow Evaluation Previous Swallow Assessment: MBS 03/22/20 = Reg/thin, no pen/asp Temperature Spikes Noted: No Respiratory Status: Nasal cannula History of Recent Intubation: Yes Length of Intubations (days):  (during surgery) Date extubated: 10/12/20 Behavior/Cognition: Alert;Cooperative;Pleasant mood Oral Cavity Assessment: Within Functional Limits Oral Care Completed by SLP: No Oral Cavity - Dentition: Dentures, top Vision: Functional for self-feeding Self-Feeding Abilities: Able to feed self Patient Positioning: Upright in bed Baseline Vocal Quality: Normal Volitional Cough: Strong Volitional Swallow: Able to elicit    Oral/Motor/Sensory Function Overall Oral Motor/Sensory Function: Within functional limits      Thin Liquid Thin Liquid: Within functional limits Presentation: Straw          Puree Puree: Within functional limits Presentation: Self Fed;Spoon   Solid     Solid: Within functional limits Presentation: [place] [facility], [facility], [place] Speech Language Pathologist Office: [phone]  Shonna Chock 10/13/2020,9:26 AM

## 2020-10-13 NOTE — Progress Notes (Signed)
[place] [id number] [facility]) Hospitalized Hospice Patient   Samantha Conrad is a current hospice patient with [facility] admitted on 09/28/20 with a terminal diagnosis of ESRD. Pt does peritoneal dialysis at home but has been not tolerating it well lately, per [facility] RN, which may have contributed to her fall.  Pt fell yesterday evening and was transported to [facility] where pt was admitted with left femoral neck fracture. Per Dr. Tomasa Hosteller, [facility] MD, this is a related admission.    Visited pt at the bedside in a joint visit with [place].  Report exchanged with bedside RN and TOC.  Pt is alert and oriented at baseline, denies pain at rest, endorses some shortness of breath.  Some coughing noted, reported new by pt.  Pt shares decision to stop peritoneal dialysis (PD) and to transition to full comfort measures, although when asked if she wanted to make that change today pt shared uncertainty about timeline.  Decision help about pt's right to self-determination and ability to choose to continue some medications while discontinuing others.  Encouraged pt to talk with her son and daughter about this decision. At end of visit pt shared plan to continue with PD today and to discuss this further with her children.  Pt appreciative of visit.     This patient remains appropriate for inpatient status due to ongoing need for PRN medications to address pain s/p ORIF as well as ongoing rehabilitation consults.  Vital Signs: 98.4 oral, 124/56, 81HR, 16 RR,  98% 2L [place]   I&O: 570/-  Abnormal Labs: CO2 21, BUN 57, Creat 6.16, GFR 6, Ca 8.4, Hgb 8.4, Platelets 74k  Diagnostics: none new  Active Hospital Problems:  Goals of care  -Hospice following, appears the patient is considering transitioning to palliative care and hospice, family discussion tonight with follow-up final decision in the morning  Impacted, Closed left femoral fracture (HCC) - s/p cannulated screw fixation - per ortho> resume  Plavix, touchdown WB for 6 wks- at risk for nonunion, avascular necrosis and hardware failure due to poor quality bone and fracture pattern  Cirrhosis,NASH, thrombocytopenia  -Follow clinically  -Platelets stable 60-80 range, no signs or symptoms of bleeding  Essential hypertension - Coreg and Amlodipine reordered with holding parameters  Hypothyroidism - cont Synthroid  ESRD (end stage renal disease) on peritoneal dialysis  - appreciate nephrology managing this  Seizure disorder - cont Keppra   CAD S/P percutaneous coronary angioplasty - on Plavix and statin  DM (diabetes mellitus), type 2 with renal complications ([place])  - holding Glucatrol, Actos,Tradjenta- resumed Lantus at 5 U- cont SSI TID  IVs/PRNs: Benadryl 25 mg q8h PRN itching, allergies, sleep x 1 dose, Vicodin 1-2 tabs 5-325 mg q6h PRN pain x 3 doses of 2 tabs    GOC: ongoing, is leaning towards comfort care  DC planning: ongoing, considering discharge to inpatient hospice facility in [facility]  IDT: hospice team updated, joint visit made with hospice SW Family: pt declined need to call him today  Domenic Moras, BSN, RN [facility] Liaison [phone] (24h on call)

## 2020-10-13 NOTE — Progress Notes (Signed)
OT Cancellation Note  Patient Details Name: Samantha Conrad MRN: [id number] DOB: June 17, 1940   Cancelled Treatment:    Reason Eval/Treat Not Completed: Other (comment) Pt currently in the middle of eating breakfast, will re-attempt eval later today.  [place], OTR/L Acute Rehab Services Pager [phone] Office [phone]      Almon Register 10/13/2020, 9:32 AM

## 2020-10-13 NOTE — Anesthesia Postprocedure Evaluation (Signed)
Anesthesia Post Note  Patient: Samantha Conrad  Procedure(s) Performed: CANNULATED HIP PINNING (Left Hip)     Patient location during evaluation: PACU Anesthesia Type: General Level of consciousness: sedated Pain management: pain level controlled Vital Signs Assessment: post-procedure vital signs reviewed and stable Respiratory status: spontaneous breathing Cardiovascular status: stable Postop Assessment: no apparent nausea or vomiting Anesthetic complications: no   No complications documented.  Last Vitals:  Vitals:   10/13/20 0754 10/13/20 1247  BP: (!) 131/57 (!) 124/56  Pulse: 77 81  Resp:    Temp: (!) 36.4 C 36.9 C  SpO2: 99% 98%    Last Pain:  Vitals:   10/13/20 1247  TempSrc: Oral  PainSc:                  Huston Foley

## 2020-10-13 NOTE — Progress Notes (Signed)
PROGRESS NOTE    [name]   DVV:[id number]  DOB: [birth year]  DOA: 10/11/2020 PCP: [facility]   Brief Narrative:  Samantha Conrad is a 80 y.o. 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   Subjective: No pain in hip unless she moves; currently on the phone with her son in good spirits denies nausea vomiting diarrhea constipation headache fevers or chills.    Assessment & Plan:   Goals of care  -Hospice following, appears the patient is considering transitioning to palliative care and hospice, family discussion tonight with follow-up final decision in the morning  Impacted, Closed left femoral fracture (HCC) - s/p cannulated screw fixation - per ortho> resume Plavix, touchdown WB for 6 wks- at risk for nonunion, avascular necrosis and hardware failure due to poor quality bone and fracture pattern  Cirrhosis,NASH, thrombocytopenia  -Follow clinically  -Platelets stable 60-80 range, no signs or symptoms of bleeding  Essential hypertension - Coreg and Amlodipine reordered with holding parameters  Hypothyroidism - cont Synthroid  ESRD (end stage renal disease) on peritoneal dialysis  - appreciate nephrology managing this  Seizure disorder - cont Keppra   CAD S/P percutaneous coronary angioplasty - on Plavix and statin  DM (diabetes mellitus), type 2 with renal complications ([place])  - holding Glucatrol, Actos,Tradjenta- resumed Lantus at 5 U- cont SSI TID     Component Value Date/Time   HGBA1C 6.6 (H) 10/12/2020 0123    Time spent in minutes: 35 DVT prophylaxis: Plavix in the setting of thrombocytopenia Code Status: DNR Family Communication:  Level  of Care: Level of care: Telemetry Medical Disposition Plan:  Status is: Inpatient  Remains inpatient appropriate because:Inpatient level of care appropriate due to severity of illness   Dispo: The patient is from: Home              Anticipated d/c is to: TBD              Patient currently is not medically stable to d/c.   Difficult to place patient No      Consultants:   Ortho  Nephrology  Palliative caer  Procedures:  Cannulated screw fixation of the left femoral neck fracture with 8 mm Biomet cannulated screws   Antimicrobials:  Anti-infectives (From admission, onward)   Start     Dose/Rate Route Frequency Ordered Stop   10/12/20 1030  ceFAZolin (ANCEF) IVPB 2g/100 mL premix        2 g 200 mL/hr over 30 Minutes Intravenous On call to O.R. 10/12/20 0939 10/12/20 1232       Objective: Vitals:   10/12/20 1432 10/12/20 1746 10/12/20 1958 10/13/20 0333  BP: (!) 142/55 (!) 135/55 (!) 112/40 114/66  Pulse: 83 82 76 72  Resp: 14 (!) 21 19 18   Temp: 97.9 F (36.6 C) 97.6 F (36.4 C) 97.6 F (36.4 C) (!) 97.5 F (36.4 C)  TempSrc: Oral Oral Oral Oral  SpO2: 95% (!) 3% 95% 97%  Weight:  79 kg    Height:        Intake/Output Summary (Last 24 hours) at 10/13/2020 0716 Last data filed at 10/12/2020 1809 Gross per 24 hour  Intake 570 ml  Output 26  ml  Net 544 ml   Filed Weights   10/11/20 1922 10/12/20 1027 10/12/20 1746  Weight: 74.7 kg 74.7 kg 79 kg    Examination: General exam: Appears comfortable  HEENT: PERRLA, oral mucosa moist, no sclera icterus or thrush Respiratory system: Clear to auscultation. Respiratory effort normal. Cardiovascular system: S1 & S2 heard, RRR.   Gastrointestinal system: Abdomen soft, non-tender, nondistended. Normal bowel sounds. PD catheter present Central nervous system: Alert and oriented. No focal neurological deficits. Extremities: No cyanosis, clubbing or edema Skin: No rashes or ulcers Psychiatry:  Mood & affect  appropriate.     Data Reviewed: I have personally reviewed following labs and imaging studies  CBC: Recent Labs  Lab 10/11/20 2001 10/12/20 0140 10/13/20 0155  WBC 7.0 7.1 5.8  NEUTROABS 5.0  --   --   HGB 9.4* 8.5* 8.4*  HCT 32.3* 28.7* 29.3*  MCV 97.9 98.3 98.7  PLT 82* 68* 74*   Basic Metabolic Panel: Recent Labs  Lab 10/11/20 2001 10/12/20 0122 10/12/20 0140 10/13/20 0155  NA 135  --  136 132*  K 3.8  --  4.0 3.9  CL 96*  --  100 98  CO2 25  --  21* 21*  GLUCOSE 104*  --  144* 340*  BUN 51*  --  52* 57*  CREATININE 6.13*  --  5.94* 6.16*  CALCIUM 8.8*  --  8.3* 8.4*  MG  --  1.5*  --   --   PHOS  --  3.2  --   --    GFR: Estimated Creatinine Clearance: 6.8 mL/min (A) (by C-G formula based on SCr of 6.16 mg/dL (H)). Liver Function Tests: Recent Labs  Lab 10/12/20 0122  AST 22  ALT 20  ALKPHOS 112  BILITOT 0.5  PROT 5.2*  ALBUMIN 2.3*   No results for input(s): LIPASE, AMYLASE in the last 168 hours. No results for input(s): AMMONIA in the last 168 hours. Coagulation Profile: Recent Labs  Lab 10/11/20 2001  INR 1.1   Cardiac Enzymes: No results for input(s): CKTOTAL, CKMB, CKMBINDEX, TROPONINI in the last 168 hours. BNP (last 3 results) No results for input(s): PROBNP in the last 8760 hours. HbA1C: Recent Labs    10/12/20 0123  HGBA1C 6.6*   CBG: Recent Labs  Lab 10/12/20 1340 10/12/20 1625 10/12/20 1956 10/12/20 2351 10/13/20 0401  GLUCAP 114* 138* 247* 329* 374*   Lipid Profile: No results for input(s): CHOL, HDL, LDLCALC, TRIG, CHOLHDL, LDLDIRECT in the last 72 hours. Thyroid Function Tests: No results for input(s): TSH, T4TOTAL, FREET4, T3FREE, THYROIDAB in the last 72 hours. Anemia Panel: No results for input(s): VITAMINB12, FOLATE, FERRITIN, TIBC, IRON, RETICCTPCT in the last 72 hours. Urine analysis:    Component Value Date/Time   COLORURINE STRAW (A) 08/10/2020 2329   APPEARANCEUR CLEAR 08/10/2020 2329   LABSPEC 1.006  08/10/2020 2329   PHURINE 8.0 08/10/2020 2329   GLUCOSEU 150 (A) 08/10/2020 2329   HGBUR SMALL (A) 08/10/2020 2329   BILIRUBINUR NEGATIVE 08/10/2020 2329   KETONESUR NEGATIVE 08/10/2020 2329   PROTEINUR 30 (A) 08/10/2020 2329   NITRITE NEGATIVE 08/10/2020 2329   LEUKOCYTESUR NEGATIVE 08/10/2020 2329   Sepsis Labs: @LABRCNTIP (procalcitonin:4,lacticidven:4) ) Recent Results (from the past 240 hour(s))  Resp Panel by RT-PCR (Flu A&B, Covid) Nasopharyngeal Swab     Status: None   Collection Time: 10/11/20  9:06 PM   Specimen: Nasopharyngeal Swab; Nasopharyngeal(NP) swabs in vial transport medium  Result Value Ref Range Status  SARS Coronavirus 2 by RT PCR NEGATIVE NEGATIVE Final    Comment: (NOTE) SARS-CoV-2 target nucleic acids are NOT DETECTED.  The SARS-CoV-2 RNA is generally detectable in upper respiratory specimens during the acute phase of infection. The lowest concentration of SARS-CoV-2 viral copies this assay can detect is 138 copies/mL. A negative result does not preclude SARS-Cov-2 infection and should not be used as the sole basis for treatment or other patient management decisions. A negative result may occur with  improper specimen collection/handling, submission of specimen other than nasopharyngeal swab, presence of viral mutation(s) within the areas targeted by this assay, and inadequate number of viral copies(<138 copies/mL). A negative result must be combined with clinical observations, patient history, and epidemiological information. The expected result is Negative.  Fact Sheet for Patients:  EntrepreneurPulse.com.au  Fact Sheet for Healthcare Providers:  IncredibleEmployment.be  This test is no t yet approved or cleared by the [place] FDA and  has been authorized for detection and/or diagnosis of SARS-CoV-2 by FDA under an Emergency Use Authorization (EUA). This EUA will remain  in effect (meaning this test can be  used) for the duration of the COVID-19 declaration under Section 564(b)(1) of the Act, 21 U.S.C.section 360bbb-3(b)(1), unless the authorization is terminated  or revoked sooner.       Influenza A by PCR NEGATIVE NEGATIVE Final   Influenza B by PCR NEGATIVE NEGATIVE Final    Comment: (NOTE) The Xpert Xpress SARS-CoV-2/FLU/RSV plus assay is intended as an aid in the diagnosis of influenza from Nasopharyngeal swab specimens and should not be used as a sole basis for treatment. Nasal washings and aspirates are unacceptable for Xpert Xpress SARS-CoV-2/FLU/RSV testing.  Fact Sheet for Patients: EntrepreneurPulse.com.au  Fact Sheet for Healthcare Providers: IncredibleEmployment.be  This test is not yet approved or cleared by the [place] FDA and has been authorized for detection and/or diagnosis of SARS-CoV-2 by FDA under an Emergency Use Authorization (EUA). This EUA will remain in effect (meaning this test can be used) for the duration of the COVID-19 declaration under Section 564(b)(1) of the Act, 21 U.S.C. section 360bbb-3(b)(1), unless the authorization is terminated or revoked.  Performed at [facility], [place] [address] [place], [place] [address]   MRSA PCR Screening     Status: None   Collection Time: 10/12/20  2:43 AM   Specimen: Nasal Mucosa; Nasopharyngeal  Result Value Ref Range Status   MRSA by PCR NEGATIVE NEGATIVE Final    Comment:        The GeneXpert MRSA Assay (FDA approved for NASAL specimens only), is one component of a comprehensive MRSA colonization surveillance program. It is not intended to diagnose MRSA infection nor to guide or monitor treatment for MRSA infections. Performed at [facility], [place] [address], [place], [place] [address]          Radiology Studies: CT HEAD WO CONTRAST  Result Date: 10/11/2020 CLINICAL DATA:  Fall with head trauma EXAM: CT HEAD WITHOUT CONTRAST CT CERVICAL  SPINE WITHOUT CONTRAST TECHNIQUE: Multidetector CT imaging of the head and cervical spine was performed following the standard protocol without intravenous contrast. Multiplanar CT image reconstructions of the cervical spine were also generated. COMPARISON:  August 10, 2020 FINDINGS: CT HEAD FINDINGS Brain: No evidence of acute large vascular territory infarction, hemorrhage, hydrocephalus, extra-axial collection or mass lesion/mass effect. Age related global parenchymal volume loss with ex vacuo dilatation of ventricular system. Stable burden of chronic microvascular disease ischemic disease. Encephalomalacia in left occipital  lobe with ex vacuo dilatation of the occipital horn of the lateral ventricle, sequela of prior insult. Mineralization of bilateral basal ganglia. Posterior fossa arachnoid cyst. Vascular: No hyperdense vessel. Atherosclerotic calcifications of the internal carotid arteries at skull base. Skull: Hyperostosis interna.  Negative for fracture or focal lesion. Sinuses/Orbits: The visualized paranasal sinuses and mastoid air cells are predominantly clear. Other: None CT CERVICAL SPINE FINDINGS Alignment: Preservation of the normal cervical lordosis. Grade 1 degenerative anterolisthesis of C3 on C4 and C6 on C7. No evidence of traumatic listhesis. Skull base and vertebrae: No acute fracture. No primary bone lesion or focal pathologic process. Soft tissues and spinal canal: No prevertebral fluid or swelling. No visible canal hematoma. Disc levels: Lower cervical predominant multilevel degenerative change of the cervical spine with disc space narrowing osteophytosis and uncovertebral/facet hypertrophy. Upper chest: Small left greater than right layering pleural effusions with adjacent compressive atelectasis. Other: None IMPRESSION: 1. No evidence of acute intracranial pathology. 2. No evidence of acute fracture or traumatic listhesis of the cervical spine. 3. Lower cervical predominant multilevel  degenerative change of the cervical spine. 4. Small left greater than right layering pleural effusions with adjacent compressive atelectasis. Electronically Signed   By: Dahlia Bailiff MD   On: 10/11/2020 21:54   CT CERVICAL SPINE WO CONTRAST  Result Date: 10/11/2020 CLINICAL DATA:  Fall with head trauma EXAM: CT HEAD WITHOUT CONTRAST CT CERVICAL SPINE WITHOUT CONTRAST TECHNIQUE: Multidetector CT imaging of the head and cervical spine was performed following the standard protocol without intravenous contrast. Multiplanar CT image reconstructions of the cervical spine were also generated. COMPARISON:  August 10, 2020 FINDINGS: CT HEAD FINDINGS Brain: No evidence of acute large vascular territory infarction, hemorrhage, hydrocephalus, extra-axial collection or mass lesion/mass effect. Age related global parenchymal volume loss with ex vacuo dilatation of ventricular system. Stable burden of chronic microvascular disease ischemic disease. Encephalomalacia in left occipital lobe with ex vacuo dilatation of the occipital horn of the lateral ventricle, sequela of prior insult. Mineralization of bilateral basal ganglia. Posterior fossa arachnoid cyst. Vascular: No hyperdense vessel. Atherosclerotic calcifications of the internal carotid arteries at skull base. Skull: Hyperostosis interna.  Negative for fracture or focal lesion. Sinuses/Orbits: The visualized paranasal sinuses and mastoid air cells are predominantly clear. Other: None CT CERVICAL SPINE FINDINGS Alignment: Preservation of the normal cervical lordosis. Grade 1 degenerative anterolisthesis of C3 on C4 and C6 on C7. No evidence of traumatic listhesis. Skull base and vertebrae: No acute fracture. No primary bone lesion or focal pathologic process. Soft tissues and spinal canal: No prevertebral fluid or swelling. No visible canal hematoma. Disc levels: Lower cervical predominant multilevel degenerative change of the cervical spine with disc space narrowing  osteophytosis and uncovertebral/facet hypertrophy. Upper chest: Small left greater than right layering pleural effusions with adjacent compressive atelectasis. Other: None IMPRESSION: 1. No evidence of acute intracranial pathology. 2. No evidence of acute fracture or traumatic listhesis of the cervical spine. 3. Lower cervical predominant multilevel degenerative change of the cervical spine. 4. Small left greater than right layering pleural effusions with adjacent compressive atelectasis. Electronically Signed   By: Dahlia Bailiff MD   On: 10/11/2020 21:54   DG Chest Port 1 View  Result Date: 10/11/2020 CLINICAL DATA:  Fall 2 hours ago EXAM: PORTABLE CHEST 1 VIEW COMPARISON:  08/10/2020 FINDINGS: Mild cardiomegaly with suspected trace pleural effusions. Vascular congestion and mild diffuse interstitial process likely edema. Patchy atelectasis left base. Aortic atherosclerosis. No pneumothorax. IMPRESSION: 1. Cardiomegaly with probable small  pleural effusions. Vascular congestion and mild interstitial pulmonary edema 2. Patchy atelectasis or pneumonia left base Electronically Signed   By: Donavan Foil M.D.   On: 10/11/2020 20:27   DG C-Arm 1-60 Min-No Report  Result Date: 10/12/2020 Fluoroscopy was utilized by the requesting physician.  No radiographic interpretation.   DG HIP PORT UNILAT WITH PELVIS 1V LEFT  Result Date: 10/12/2020 CLINICAL DATA:  Left leg pain EXAM: DG HIP (WITH OR WITHOUT PELVIS) 1V PORT LEFT COMPARISON:  Films from earlier in the same day as well as 10/11/2020 FINDINGS: Pelvic ring is intact. Three fixation screws are noted traversing the proximal left femur. No acute bony or soft tissue abnormality is noted. IMPRESSION: Screw fixation of the proximal left femur. Electronically Signed   By: Inez Catalina M.D.   On: 10/12/2020 21:21   DG Hip Port Unilat With Pelvis 1V Left  Result Date: 10/12/2020 CLINICAL DATA:  Status post surgical internal fixation of left hip fracture. EXAM: DG  HIP (WITH OR WITHOUT PELVIS) 1V PORT LEFT COMPARISON:  Fluoroscopic images of same day. FINDINGS: Status post surgical internal fixation of proximal left femoral fracture with 3 surgical screws. Good alignment of fracture components is noted. IMPRESSION: Status post surgical internal fixation of proximal left femoral neck fracture. Electronically Signed   By: Marijo Conception M.D.   On: 10/12/2020 16:28   DG HIP OPERATIVE UNILAT WITH PELVIS LEFT  Result Date: 10/12/2020 CLINICAL DATA:  Fixation for fracture EXAM: OPERATIVE LEFT HIP   2 VIEWS TECHNIQUE: Fluoroscopic spot image(s) were submitted for interpretation post-operatively. COMPARISON:  Preoperative pelvis and left hip radiographs Oct 11, 2020 FLUOROSCOPY TIME:  76.5 seconds; 27.28 mGy; 4 acquired images FINDINGS: Frontal and lateral views obtained. There are 3 screws transfixing a fracture of the subcapital femoral neck fracture with alignment fracture site essentially anatomic. Screw tips are in the proximal femoral head. No new fracture. No dislocation. Mild narrowing left hip joint. IMPRESSION: Screws transfix a fracture of the subcapital femoral neck region with screw tips in proximal femoral head. Alignment anatomic at fracture site. No new fracture. No dislocation. Narrowing left hip joint. Electronically Signed   By: Lowella Grip III M.D.   On: 10/12/2020 15:07   DG Hip Unilat With Pelvis 2-3 Views Left  Result Date: 10/11/2020 CLINICAL DATA:  Fall EXAM: DG HIP (WITH OR WITHOUT PELVIS) 2-3V LEFT COMPARISON:  CT 01/22/2019 FINDINGS: SI joints are non widened. Pubic symphysis and rami appear intact. Acute mildly displaced subcapital left femoral neck fracture. No femoral head dislocation IMPRESSION: Acute minimally displaced left femoral neck fracture Electronically Signed   By: Donavan Foil M.D.   On: 10/11/2020 20:28      Scheduled Meds: . amLODipine  5 mg Oral QHS  . atorvastatin  40 mg Oral q1800  . carvedilol  3.125 mg Oral BID  WC  . clopidogrel  75 mg Oral Daily  . docusate sodium  100 mg Oral BID  . feeding supplement (NEPRO CARB STEADY)  237 mL Oral BID BM  . gentamicin cream  1 application Topical Daily  . insulin aspart  0-6 Units Subcutaneous TID WC  . insulin glargine  5 Units Subcutaneous QHS  . levETIRAcetam  250 mg Oral BID WC  . levothyroxine  100 mcg Oral QAC breakfast  . multivitamin  1 tablet Oral QHS  . pantoprazole  40 mg Oral Q breakfast  . sevelamer carbonate  800 mg Oral TID WC  . traZODone  50 mg Oral  QHS   Continuous Infusions: . sodium chloride 10 mL/hr at 10/12/20 1204  . dialysis solution 4.25% low-MG/low-CA    . methocarbamol (ROBAXIN) IV       LOS: 2 days      Little Ishikawa, DO [facility] Pager: Secure chat   If 7 PM to 7 AM: Please contact provider at [email or web address] 10/13/2020, 7:16 AM

## 2020-10-13 NOTE — Progress Notes (Addendum)
This chaplain responded to PMT consult for spiritual care.  The chaplain is present with the Pt. as she is moved from the recliner to the bed.  The chaplain is appreciative of the RN's timeliness.   The chaplain listened reflectively as the Pt. described her loving relationship with her children-Ricky and Alesia.  The Pt. trusts Ricky's communication skills with the medical team and family.  The Pt. also shared she has talked to her sister on the phone and her sister plans to visit.  The chaplain understands from the Pt. her family is accepting of the Pt. decision to focus on comfort.  The Pt. is open to Hospice care in [place] close to her children.    The chaplain understands the Pt. faith in God has  allowed her to trust God's will for her life. The Pt. expresses she wants to be pain free and end all unnecessary medical interventions.    The chaplain was bedside with the Pt. when the Lab came for blood work.  The Pt. appreciated the chaplain's voice in questioning the necessity of the blood work. The chaplain understands the order will be re-evaluated on Thursday morning.   The Pt. accepted the chaplain's invitation for F/U spiritual care and prayer.

## 2020-10-13 NOTE — Progress Notes (Signed)
[facility] Progress Note  Patient ID: Samantha Conrad MRN: [id number] DOB/AGE: 09-03-1940 80 y.o.  Subjective:  Doing very well Looks much improved this am  Pain improved Just completed swallow eval, no changes in diet recs  Wants snf in [place]   Sugars elevated   ROS As above  Objective:   VITALS:   Vitals:   10/12/20 1746 10/12/20 1958 10/13/20 0333 10/13/20 0754  BP: (!) 135/55 (!) 112/40 114/66 (!) 131/57  Pulse: 82 76 72 77  Resp: (!) 21 19 18    Temp: 97.6 F (36.4 C) 97.6 F (36.4 C) (!) 97.5 F (36.4 C) (!) 97.5 F (36.4 C)  TempSrc: Oral Oral Oral Oral  SpO2: (!) 3% 95% 97% 99%  Weight: 79 kg   78.9 kg  Height:        Estimated body mass index is 32.87 kg/m as calculated from the following:   Height as of this encounter: 5' 1"  (1.549 m).   Weight as of this encounter: 78.9 kg.   Intake/Output      05/17 0701 05/18 0700 05/18 0701 05/19 0700   P.O. 120 360   I.V. (mL/kg) 350 (4.4)    IV Piggyback 100    Total Intake(mL/kg) 570 (7.2) 360 (4.6)   Urine (mL/kg/hr) 1 (0)    Blood 25    Total Output 26    Net +544 +360        Urine Occurrence 2 x      LABS  Results for orders placed or performed during the hospital encounter of 10/11/20 (from the past 24 hour(s))  Glucose, capillary     Status: Abnormal   Collection Time: 10/12/20 10:15 AM  Result Value Ref Range   Glucose-Capillary 131 (H) 70 - 99 mg/dL  Glucose, capillary     Status: Abnormal   Collection Time: 10/12/20  1:40 PM  Result Value Ref Range   Glucose-Capillary 114 (H) 70 - 99 mg/dL  Glucose, capillary     Status: Abnormal   Collection Time: 10/12/20  4:25 PM  Result Value Ref Range   Glucose-Capillary 138 (H) 70 - 99 mg/dL  Glucose, capillary     Status: Abnormal   Collection Time: 10/12/20  7:56 PM  Result Value Ref Range   Glucose-Capillary 247 (H) 70 - 99 mg/dL  Glucose,  capillary     Status: Abnormal   Collection Time: 10/12/20 11:51 PM  Result Value Ref Range   Glucose-Capillary 329 (H) 70 - 99 mg/dL  CBC     Status: Abnormal   Collection Time: 10/13/20  1:55 AM  Result Value Ref Range   WBC 5.8 4.0 - 10.5 K/uL   RBC 2.97 (L) 3.87 - 5.11 MIL/uL   Hemoglobin 8.4 (L) 12.0 - 15.0 g/dL   HCT 29.3 (L) 36.0 - 46.0 %   MCV 98.7 80.0 - 100.0 fL   MCH 28.3 26.0 - 34.0 pg   MCHC 28.7 (L) 30.0 - 36.0 g/dL   RDW 17.9 (H) 11.5 - 15.5 %   Platelets 74 (L) 150 - 400 K/uL   nRBC 0.0 0.0 - 0.2 %  Basic metabolic panel     Status: Abnormal   Collection Time: 10/13/20  1:55 AM  Result Value Ref Range   Sodium 132 (L) 135 - 145 mmol/L  Potassium 3.9 3.5 - 5.1 mmol/L   Chloride 98 98 - 111 mmol/L   CO2 21 (L) 22 - 32 mmol/L   Glucose, Bld 340 (H) 70 - 99 mg/dL   BUN 57 (H) 8 - 23 mg/dL   Creatinine, Ser 6.16 (H) 0.44 - 1.00 mg/dL   Calcium 8.4 (L) 8.9 - 10.3 mg/dL   GFR, Estimated 6 (L) >60 mL/min   Anion gap 13 5 - 15  Glucose, capillary     Status: Abnormal   Collection Time: 10/13/20  4:01 AM  Result Value Ref Range   Glucose-Capillary 374 (H) 70 - 99 mg/dL   Comment 1 Notify RN   Glucose, capillary     Status: Abnormal   Collection Time: 10/13/20  8:40 AM  Result Value Ref Range   Glucose-Capillary 343 (H) 70 - 99 mg/dL  Glucose, capillary     Status: Abnormal   Collection Time: 10/13/20  9:03 AM  Result Value Ref Range   Glucose-Capillary 342 (H) 70 - 99 mg/dL   CBG (last 3)  Recent Labs    10/13/20 0401 10/13/20 0840 10/13/20 0903  GLUCAP 374* 343* 342*      PHYSICAL EXAM:   Gen: sitting up in bed, NAD, eating breakfast, appears well Lungs: unlabored Ext:       Left Lower Extremity   Dressing stable   strikethrough noted on dressing but overall stable  Ext warm   DPN, SPN, TN sensation intact  EHL, FHL, lesser toe motor intact  Ankle flexion, extension, inversion and eversion intact  No DCT  + DP pulse  + pitting edema but  unchanged   Assessment/Plan: 1 Day Post-Op   Principal Problem:   Closed left hip fracture (HCC) Active Problems:   Cirrhosis (HCC)   NASH (nonalcoholic steatohepatitis)   Essential hypertension   Hypothyroidism   ESRD (end stage renal disease) on dialysis (HCC)   Seizure disorder   Dyslipidemia, goal LDL below 70   CAD S/P percutaneous coronary angioplasty   Anemia   Thrombocytopenia (HCC)   DM (diabetes mellitus), type 2 with renal complications (HCC)   Anti-infectives (From admission, onward)   Start     Dose/Rate Route Frequency Ordered Stop   10/12/20 1030  ceFAZolin (ANCEF) IVPB 2g/100 mL premix        2 g 200 mL/hr over 30 Minutes Intravenous On call to O.R. 10/12/20 4193 10/12/20 1232    .  POD/HD#: 1  80 y/o female s/p GLF with L Femoral neck fracture, peritoneal dialysis   -fall  - fragility fracture/insufficiency fracture Left femoral neck   TDWB/PWB L leg (25%)  Unrestricted ROM L hip and knee   PT/OT evals  Dressing changes as needed starting tomorrow   Ice prn for swelling and pain   - Pain management:  Multimodal  Minimize narcotics  No NSAIDs due to renal failure   - ABL anemia/Hemodynamics  Monitor    Did not give TXA  periop due to stroke history and history of cardiac stents    - Medical issues   Per primary    DM   Tighter sugar control   - DVT/PE prophylaxis:  plavix resumed    - ID:   periop abx   - Metabolic Bone Disease:  Check vitamin d level  Fracture indicative of osteoporosis   - Activity:  As above  - FEN/GI prophylaxis/Foley/Lines:  Carb mod/renal diet  - Impediments to fracture healing:  ESRD  Poor bone quality  DM  - Dispo:  PT/OT  TOC consult for SNF    Pt would like to be in Raina Mina, PA-C [phone] (C) 10/13/2020, 9:32 AM  [facility] [place] [id number] [phone] Domingo Sep (F)    After 5pm and on the weekends  please log on to Amion, go to orthopaedics and the look under the [facility] Call for the provider(s) on call. You can also call our office at [phone] and then follow the prompts to be connected to the call team.

## 2020-10-14 ENCOUNTER — Other Ambulatory Visit: Payer: Self-pay

## 2020-10-14 DIAGNOSIS — Z515 Encounter for palliative care: Secondary | ICD-10-CM

## 2020-10-14 DIAGNOSIS — S72002S Fracture of unspecified part of neck of left femur, sequela: Secondary | ICD-10-CM

## 2020-10-14 DIAGNOSIS — M25552 Pain in left hip: Secondary | ICD-10-CM

## 2020-10-14 LAB — GLUCOSE, CAPILLARY
Glucose-Capillary: 323 mg/dL — ABNORMAL HIGH (ref 70–99)
Glucose-Capillary: 127 mg/dL — ABNORMAL HIGH (ref 70–99)

## 2020-10-14 NOTE — TOC CAGE-AID Note (Signed)
Transition of Care [facility]) - CAGE-AID Screening   Patient Details  Name: Samantha Conrad MRN: [id number] Date of Birth: December 09, 1940  Clinical Narrative:  Patient denies any current alcohol or drug use, no need for resources.  CAGE-AID Screening:    Have You Ever Felt You Ought to Cut Down on Your Drinking or Drug Use?: No Have People Annoyed You By Critizing Your Drinking Or Drug Use?: No Have You Felt Bad Or Guilty About Your Drinking Or Drug Use?: No Have You Ever Had a Drink or Used Drugs First Thing In The Morning to Steady Your Nerves or to Get Rid of a Hangover?: No CAGE-AID Score: 0  Substance Abuse Education Offered: No

## 2020-10-14 NOTE — Progress Notes (Signed)
[facility]  We received official referral request for pt to transfer to [facility] for end of life care. The pt family is in agreement. We have met with pt at bedside as well. The pt confirms this is her wishes and she has completed paperwork with our services. [facility] has also seen the pt and she has completed the change of destination form. I have reached out to the attending and SW via chat to update them and see if pt is ready for d/c. We can accept the pt today if all in agreement.   Webb Silversmith RN [phone]

## 2020-10-14 NOTE — Discharge Summary (Signed)
Physician Discharge Summary  KAITLYNNE WENZ NOB:[id number] DOB: January 24, 1941 DOA: 10/11/2020  PCP: [facility] [place] date: 10/11/2020 Discharge date: 10/14/2020  Admitted From: Home Disposition: Hospice  Recommendations for Outpatient Follow-up:  Follow up with orthopedics as scheduled  Discharge Condition: Stable CODE STATUS: DNR Diet recommendation: As tolerated  Brief/Interim Summary: WILLONA PHARISS is a 80 y.o.female who is on palliative care at home with  ESRD onPD7 days a week, DMT2,HTN, hypothyroidism, pancreatic insufficiency , h/o bilateral corneal implants, h/o NASH cirrhosis, chronic thrombocytopeniaandL PCA stroke on 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  Patient admitted as above with impacted closed left femoral fracture status post cannulated screw fixation.  Tolerated procedure quite well Ortho recommending 6 weeks of touchdown weightbearing with advancement and ambulation thereafter per PT and Ortho recommendations.  Due to patient's other chronic comorbid conditions as outlined above she remains high risk for further clinical decline, palliative care was involved during hospitalization and after lengthy discussion with family and patient about ongoing goals of care and prognosis family and patient have agreed to transition to comfort measures and hospice care with ultimate disposition to hospice house later today.  She remains DNR, will discontinue peritoneal dialysis, minimize home medications and transfer to hospice house for end-of-life care.  Patient and family otherwise agreeable for this plan and transition to hospice house today if available.  Discharge Diagnoses:  Principal Problem:   Closed left hip fracture ([place]) Active Problems:   Cirrhosis (HCC)   NASH (nonalcoholic steatohepatitis)   Essential hypertension   Hypothyroidism   ESRD (end stage renal disease)  on dialysis (HCC)   Seizure disorder   Dyslipidemia, goal LDL below 70   CAD S/P percutaneous coronary angioplasty   Anemia   Thrombocytopenia (HCC)   DM (diabetes mellitus), type 2 with renal complications [facility])    Discharge Instructions  Discharge Instructions    Call MD for:  severe uncontrolled pain   Complete by: As directed    Diet - low sodium heart healthy   Complete by: As directed    Discharge wound care:   Complete by: As directed    Clean skin near exit site with chloraprep swab sticks.  Starting at catheter, use circular pattern around exit site, moving towards outer edges of area covered by dressing.  Apply gentamicin cream to site once daily.  Cover with dry dressing.   Increase activity slowly   Complete by: As directed      Allergies as of 10/14/2020      Reactions   Contrast Media [iodinated Diagnostic Agents] Swelling   SWELLING REACTION UNSPECIFIED    Tape Other (See Comments), Dermatitis   Tears skin - please use paper tape Paper tape only      Medication List    TAKE these medications   acetaminophen-codeine 300-30 MG tablet Commonly known as: TYLENOL #3 Take 1-2 tablets by mouth every 6 (six) hours as needed for moderate pain.   amLODipine 5 MG tablet Commonly known as: NORVASC TAKE 1 TABLET BY MOUTH EVERY DAY What changed: when to take this   atorvastatin 40 MG tablet Commonly known as: LIPITOR TAKE 1 TABLET (40 MG TOTAL) BY MOUTH DAILY AT 6 PM.   carvedilol 3.125 MG tablet Commonly known as: COREG Take 1 tablet (3.125 mg total) by mouth 2 (two) times daily with a meal.   cholecalciferol 25 MCG (1000 UNIT) tablet Commonly known as:  VITAMIN D3 Take 2,000 Units by mouth daily with breakfast.   citalopram 10 MG tablet Commonly known as: CELEXA Take 10 mg by mouth daily with breakfast.   clopidogrel 75 MG tablet Commonly known as: PLAVIX TAKE 1 TABLET (75 MG TOTAL) BY MOUTH DAILY WITH BREAKFAST.   docusate sodium 100 MG  capsule Commonly known as: COLACE Take 100 mg by mouth at bedtime.   feeding supplement (NEPRO CARB STEADY) Liqd Take 237 mLs by mouth daily. What changed: when to take this   feeding supplement (PRO-STAT SUGAR FREE 64) Liqd Take 30 mLs by mouth 2 (two) times daily.   glipiZIDE 5 MG tablet Commonly known as: GLUCOTROL Take 5 mg by mouth daily before breakfast.   Lantus SoloStar 100 UNIT/ML Solostar Pen Generic drug: insulin glargine Inject 14 Units into the skin at bedtime.   levETIRAcetam 250 MG tablet Commonly known as: KEPPRA Take 250 mg by mouth 2 (two) times daily with a meal.   levothyroxine 100 MCG tablet Commonly known as: SYNTHROID Take 100 mcg by mouth daily before breakfast.   linagliptin 5 MG Tabs tablet Commonly known as: TRADJENTA Take 5 mg by mouth daily with breakfast.   lipase/protease/amylase 12000-38000 units Cpep capsule Commonly known as: CREON Take 48,000 Units by mouth 3 (three) times daily with meals.   MIRCERA IJ Inject into the skin.   multivitamin Tabs tablet Take 1 tablet by mouth daily. What changed: when to take this   nitroGLYCERIN 0.4 MG SL tablet Commonly known as: NITROSTAT Place 0.4 mg under the tongue every 5 (five) minutes as needed for chest pain.   pantoprazole 40 MG tablet Commonly known as: PROTONIX Take 40 mg by mouth daily with breakfast.   pioglitazone 15 MG tablet Commonly known as: ACTOS Take 15 mg by mouth daily with breakfast.   polyethylene glycol 17 g packet Commonly known as: MIRALAX / GLYCOLAX Take 8.5-17 g by mouth daily.   potassium chloride SA 20 MEQ tablet Commonly known as: KLOR-CON Take 20 mEq by mouth daily with breakfast.   prednisoLONE acetate 1 % ophthalmic suspension Commonly known as: PRED FORTE Place 1 drop into both eyes at bedtime.   sevelamer carbonate 800 MG tablet Commonly known as: RENVELA Take 800 mg by mouth 3 (three) times daily with meals.   traZODone 50 MG tablet Commonly  known as: DESYREL Take 50 mg by mouth at bedtime.            Discharge Care Instructions  (From admission, onward)         Start     Ordered   10/14/20 0000  Discharge wound care:       Comments: Clean skin near exit site with chloraprep swab sticks.  Starting at catheter, use circular pattern around exit site, moving towards outer edges of area covered by dressing.  Apply gentamicin cream to site once daily.  Cover with dry dressing.   10/14/20 1430          Allergies  Allergen Reactions  . Contrast Media [Iodinated Diagnostic Agents] Swelling    SWELLING REACTION UNSPECIFIED   . Tape Other (See Comments) and Dermatitis    Tears skin - please use paper tape Paper tape only    Consultations: Orthopedic surgery, palliative care  Procedures/Studies: CT HEAD WO CONTRAST  Result Date: 10/11/2020 CLINICAL DATA:  Fall with head trauma EXAM: CT HEAD WITHOUT CONTRAST CT CERVICAL SPINE WITHOUT CONTRAST TECHNIQUE: Multidetector CT imaging of the head and cervical spine was performed  following the standard protocol without intravenous contrast. Multiplanar CT image reconstructions of the cervical spine were also generated. COMPARISON:  August 10, 2020 FINDINGS: CT HEAD FINDINGS Brain: No evidence of acute large vascular territory infarction, hemorrhage, hydrocephalus, extra-axial collection or mass lesion/mass effect. Age related global parenchymal volume loss with ex vacuo dilatation of ventricular system. Stable burden of chronic microvascular disease ischemic disease. Encephalomalacia in left occipital lobe with ex vacuo dilatation of the occipital horn of the lateral ventricle, sequela of prior insult. Mineralization of bilateral basal ganglia. Posterior fossa arachnoid cyst. Vascular: No hyperdense vessel. Atherosclerotic calcifications of the internal carotid arteries at skull base. Skull: Hyperostosis interna.  Negative for fracture or focal lesion. Sinuses/Orbits: The visualized  paranasal sinuses and mastoid air cells are predominantly clear. Other: None CT CERVICAL SPINE FINDINGS Alignment: Preservation of the normal cervical lordosis. Grade 1 degenerative anterolisthesis of C3 on C4 and C6 on C7. No evidence of traumatic listhesis. Skull base and vertebrae: No acute fracture. No primary bone lesion or focal pathologic process. Soft tissues and spinal canal: No prevertebral fluid or swelling. No visible canal hematoma. Disc levels: Lower cervical predominant multilevel degenerative change of the cervical spine with disc space narrowing osteophytosis and uncovertebral/facet hypertrophy. Upper chest: Small left greater than right layering pleural effusions with adjacent compressive atelectasis. Other: None IMPRESSION: 1. No evidence of acute intracranial pathology. 2. No evidence of acute fracture or traumatic listhesis of the cervical spine. 3. Lower cervical predominant multilevel degenerative change of the cervical spine. 4. Small left greater than right layering pleural effusions with adjacent compressive atelectasis. Electronically Signed   By: Dahlia Bailiff MD   On: 10/11/2020 21:54   CT CERVICAL SPINE WO CONTRAST  Result Date: 10/11/2020 CLINICAL DATA:  Fall with head trauma EXAM: CT HEAD WITHOUT CONTRAST CT CERVICAL SPINE WITHOUT CONTRAST TECHNIQUE: Multidetector CT imaging of the head and cervical spine was performed following the standard protocol without intravenous contrast. Multiplanar CT image reconstructions of the cervical spine were also generated. COMPARISON:  August 10, 2020 FINDINGS: CT HEAD FINDINGS Brain: No evidence of acute large vascular territory infarction, hemorrhage, hydrocephalus, extra-axial collection or mass lesion/mass effect. Age related global parenchymal volume loss with ex vacuo dilatation of ventricular system. Stable burden of chronic microvascular disease ischemic disease. Encephalomalacia in left occipital lobe with ex vacuo dilatation of the  occipital horn of the lateral ventricle, sequela of prior insult. Mineralization of bilateral basal ganglia. Posterior fossa arachnoid cyst. Vascular: No hyperdense vessel. Atherosclerotic calcifications of the internal carotid arteries at skull base. Skull: Hyperostosis interna.  Negative for fracture or focal lesion. Sinuses/Orbits: The visualized paranasal sinuses and mastoid air cells are predominantly clear. Other: None CT CERVICAL SPINE FINDINGS Alignment: Preservation of the normal cervical lordosis. Grade 1 degenerative anterolisthesis of C3 on C4 and C6 on C7. No evidence of traumatic listhesis. Skull base and vertebrae: No acute fracture. No primary bone lesion or focal pathologic process. Soft tissues and spinal canal: No prevertebral fluid or swelling. No visible canal hematoma. Disc levels: Lower cervical predominant multilevel degenerative change of the cervical spine with disc space narrowing osteophytosis and uncovertebral/facet hypertrophy. Upper chest: Small left greater than right layering pleural effusions with adjacent compressive atelectasis. Other: None IMPRESSION: 1. No evidence of acute intracranial pathology. 2. No evidence of acute fracture or traumatic listhesis of the cervical spine. 3. Lower cervical predominant multilevel degenerative change of the cervical spine. 4. Small left greater than right layering pleural effusions with adjacent compressive atelectasis. Electronically Signed  By: Dahlia Bailiff MD   On: 10/11/2020 21:54   DG Chest Port 1 View  Result Date: 10/11/2020 CLINICAL DATA:  Fall 2 hours ago EXAM: PORTABLE CHEST 1 VIEW COMPARISON:  08/10/2020 FINDINGS: Mild cardiomegaly with suspected trace pleural effusions. Vascular congestion and mild diffuse interstitial process likely edema. Patchy atelectasis left base. Aortic atherosclerosis. No pneumothorax. IMPRESSION: 1. Cardiomegaly with probable small pleural effusions. Vascular congestion and mild interstitial  pulmonary edema 2. Patchy atelectasis or pneumonia left base Electronically Signed   By: Donavan Foil M.D.   On: 10/11/2020 20:27   DG C-Arm 1-60 Min-No Report  Result Date: 10/12/2020 Fluoroscopy was utilized by the requesting physician.  No radiographic interpretation.   DG HIP PORT UNILAT WITH PELVIS 1V LEFT  Result Date: 10/12/2020 CLINICAL DATA:  Left leg pain EXAM: DG HIP (WITH OR WITHOUT PELVIS) 1V PORT LEFT COMPARISON:  Films from earlier in the same day as well as 10/11/2020 FINDINGS: Pelvic ring is intact. Three fixation screws are noted traversing the proximal left femur. No acute bony or soft tissue abnormality is noted. IMPRESSION: Screw fixation of the proximal left femur. Electronically Signed   By: Inez Catalina M.D.   On: 10/12/2020 21:21   DG Hip Port Unilat With Pelvis 1V Left  Result Date: 10/12/2020 CLINICAL DATA:  Status post surgical internal fixation of left hip fracture. EXAM: DG HIP (WITH OR WITHOUT PELVIS) 1V PORT LEFT COMPARISON:  Fluoroscopic images of same day. FINDINGS: Status post surgical internal fixation of proximal left femoral fracture with 3 surgical screws. Good alignment of fracture components is noted. IMPRESSION: Status post surgical internal fixation of proximal left femoral neck fracture. Electronically Signed   By: Marijo Conception M.D.   On: 10/12/2020 16:28   DG HIP OPERATIVE UNILAT WITH PELVIS LEFT  Result Date: 10/12/2020 CLINICAL DATA:  Fixation for fracture EXAM: OPERATIVE LEFT HIP   2 VIEWS TECHNIQUE: Fluoroscopic spot image(s) were submitted for interpretation post-operatively. COMPARISON:  Preoperative pelvis and left hip radiographs Oct 11, 2020 FLUOROSCOPY TIME:  76.5 seconds; 27.28 mGy; 4 acquired images FINDINGS: Frontal and lateral views obtained. There are 3 screws transfixing a fracture of the subcapital femoral neck fracture with alignment fracture site essentially anatomic. Screw tips are in the proximal femoral head. No new fracture. No  dislocation. Mild narrowing left hip joint. IMPRESSION: Screws transfix a fracture of the subcapital femoral neck region with screw tips in proximal femoral head. Alignment anatomic at fracture site. No new fracture. No dislocation. Narrowing left hip joint. Electronically Signed   By: Lowella Grip III M.D.   On: 10/12/2020 15:07   DG Hip Unilat With Pelvis 2-3 Views Left  Result Date: 10/11/2020 CLINICAL DATA:  Fall EXAM: DG HIP (WITH OR WITHOUT PELVIS) 2-3V LEFT COMPARISON:  CT 01/22/2019 FINDINGS: SI joints are non widened. Pubic symphysis and rami appear intact. Acute mildly displaced subcapital left femoral neck fracture. No femoral head dislocation IMPRESSION: Acute minimally displaced left femoral neck fracture Electronically Signed   By: Donavan Foil M.D.   On: 10/11/2020 20:28      Subjective: No acute issues or events overnight   Discharge Exam: Vitals:   10/14/20 0827 10/14/20 1028  BP: 130/63 (!) 128/55  Pulse: 84 79  Resp: 18 18  Temp:  98.2 F (36.8 C)  SpO2: 92% 99%   Vitals:   10/13/20 2045 10/14/20 0443 10/14/20 0827 10/14/20 1028  BP: (!) 129/42 (!) 124/58 130/63 (!) 128/55  Pulse: 91 89 84  79  Resp:   18 18  Temp: 98.7 F (37.1 C) 98.2 F (36.8 C)  98.2 F (36.8 C)  TempSrc: Oral Oral  Oral  SpO2: 100% 98% 92% 99%  Weight: 78.7 kg   79.6 kg  Height:        General: Pt is alert, awake, not in acute distress Cardiovascular: RRR, S1/S2 +, no rubs, no gallops Respiratory: CTA bilaterally, no wheezing, no rhonchi Abdominal: Soft, NT, ND, bowel sounds + Extremities: no edema, no cyanosis    The results of significant diagnostics from this hospitalization (including imaging, microbiology, ancillary and laboratory) are listed below for reference.     Microbiology: Recent Results (from the past 240 hour(s))  Resp Panel by RT-PCR (Flu A&B, Covid) Nasopharyngeal Swab     Status: None   Collection Time: 10/11/20  9:06 PM   Specimen: Nasopharyngeal  Swab; Nasopharyngeal(NP) swabs in vial transport medium  Result Value Ref Range Status   SARS Coronavirus 2 by RT PCR NEGATIVE NEGATIVE Final    Comment: (NOTE) SARS-CoV-2 target nucleic acids are NOT DETECTED.  The SARS-CoV-2 RNA is generally detectable in upper respiratory specimens during the acute phase of infection. The lowest concentration of SARS-CoV-2 viral copies this assay can detect is 138 copies/mL. A negative result does not preclude SARS-Cov-2 infection and should not be used as the sole basis for treatment or other patient management decisions. A negative result may occur with  improper specimen collection/handling, submission of specimen other than nasopharyngeal swab, presence of viral mutation(s) within the areas targeted by this assay, and inadequate number of viral copies(<138 copies/mL). A negative result must be combined with clinical observations, patient history, and epidemiological information. The expected result is Negative.  Fact Sheet for Patients:  EntrepreneurPulse.com.au  Fact Sheet for Healthcare Providers:  IncredibleEmployment.be  This test is no t yet approved or cleared by the [place] FDA and  has been authorized for detection and/or diagnosis of SARS-CoV-2 by FDA under an Emergency Use Authorization (EUA). This EUA will remain  in effect (meaning this test can be used) for the duration of the COVID-19 declaration under Section 564(b)(1) of the Act, 21 U.S.C.section 360bbb-3(b)(1), unless the authorization is terminated  or revoked sooner.       Influenza A by PCR NEGATIVE NEGATIVE Final   Influenza B by PCR NEGATIVE NEGATIVE Final    Comment: (NOTE) The Xpert Xpress SARS-CoV-2/FLU/RSV plus assay is intended as an aid in the diagnosis of influenza from Nasopharyngeal swab specimens and should not be used as a sole basis for treatment. Nasal washings and aspirates are unacceptable for Xpert Xpress  SARS-CoV-2/FLU/RSV testing.  Fact Sheet for Patients: EntrepreneurPulse.com.au  Fact Sheet for Healthcare Providers: IncredibleEmployment.be  This test is not yet approved or cleared by the [place] FDA and has been authorized for detection and/or diagnosis of SARS-CoV-2 by FDA under an Emergency Use Authorization (EUA). This EUA will remain in effect (meaning this test can be used) for the duration of the COVID-19 declaration under Section 564(b)(1) of the Act, 21 U.S.C. section 360bbb-3(b)(1), unless the authorization is terminated or revoked.  Performed at [facility], [place] [address], [place],  [address]   MRSA PCR Screening     Status: None   Collection Time: 10/12/20  2:43 AM   Specimen: Nasal Mucosa; Nasopharyngeal  Result Value Ref Range Status   MRSA by PCR NEGATIVE NEGATIVE Final    Comment:        The GeneXpert MRSA  Assay (FDA approved for NASAL specimens only), is one component of a comprehensive MRSA colonization surveillance program. It is not intended to diagnose MRSA infection nor to guide or monitor treatment for MRSA infections. Performed at [facility], [place] [address] [place], [place] [address]      Labs: BNP (last 3 results) No results for input(s): BNP in the last 8760 hours. Basic Metabolic Panel: Recent Labs  Lab 10/11/20 2001 10/12/20 0122 10/12/20 0140 10/13/20 0155  NA 135  --  136 132*  K 3.8  --  4.0 3.9  CL 96*  --  100 98  CO2 25  --  21* 21*  GLUCOSE 104*  --  144* 340*  BUN 51*  --  52* 57*  CREATININE 6.13*  --  5.94* 6.16*  CALCIUM 8.8*  --  8.3* 8.4*  MG  --  1.5*  --   --   PHOS  --  3.2  --   --    Liver Function Tests: Recent Labs  Lab 10/12/20 0122  AST 22  ALT 20  ALKPHOS 112  BILITOT 0.5  PROT 5.2*  ALBUMIN 2.3*   No results for input(s): LIPASE, AMYLASE in the last 168 hours. No results for input(s): AMMONIA in the last 168 hours. CBC: Recent  Labs  Lab 10/11/20 2001 10/12/20 0140 10/13/20 0155  WBC 7.0 7.1 5.8  NEUTROABS 5.0  --   --   HGB 9.4* 8.5* 8.4*  HCT 32.3* 28.7* 29.3*  MCV 97.9 98.3 98.7  PLT 82* 68* 74*   Cardiac Enzymes: No results for input(s): CKTOTAL, CKMB, CKMBINDEX, TROPONINI in the last 168 hours. BNP: Invalid input(s): POCBNP CBG: Recent Labs  Lab 10/13/20 1203 10/13/20 1610 10/13/20 1952 10/13/20 2347 10/14/20 0438  GLUCAP 160* 110* 113* 248* 323*   D-Dimer No results for input(s): DDIMER in the last 72 hours. Hgb A1c Recent Labs    10/12/20 0123  HGBA1C 6.6*   Lipid Profile No results for input(s): CHOL, HDL, LDLCALC, TRIG, CHOLHDL, LDLDIRECT in the last 72 hours. Thyroid function studies No results for input(s): TSH, T4TOTAL, T3FREE, THYROIDAB in the last 72 hours.  Invalid input(s): FREET3 Anemia work up No results for input(s): VITAMINB12, FOLATE, FERRITIN, TIBC, IRON, RETICCTPCT in the last 72 hours. Urinalysis    Component Value Date/Time   COLORURINE STRAW (A) 08/10/2020 2329   APPEARANCEUR CLEAR 08/10/2020 2329   LABSPEC 1.006 08/10/2020 2329   PHURINE 8.0 08/10/2020 2329   GLUCOSEU 150 (A) 08/10/2020 2329   HGBUR SMALL (A) 08/10/2020 2329   BILIRUBINUR NEGATIVE 08/10/2020 2329   KETONESUR NEGATIVE 08/10/2020 2329   PROTEINUR 30 (A) 08/10/2020 2329   NITRITE NEGATIVE 08/10/2020 2329   LEUKOCYTESUR NEGATIVE 08/10/2020 2329   Sepsis Labs Invalid input(s): PROCALCITONIN,  WBC,  LACTICIDVEN Microbiology Recent Results (from the past 240 hour(s))  Resp Panel by RT-PCR (Flu A&B, Covid) Nasopharyngeal Swab     Status: None   Collection Time: 10/11/20  9:06 PM   Specimen: Nasopharyngeal Swab; Nasopharyngeal(NP) swabs in vial transport medium  Result Value Ref Range Status   SARS Coronavirus 2 by RT PCR NEGATIVE NEGATIVE Final    Comment: (NOTE) SARS-CoV-2 target nucleic acids are NOT DETECTED.  The SARS-CoV-2 RNA is generally detectable in upper respiratory specimens  during the acute phase of infection. The lowest concentration of SARS-CoV-2 viral copies this assay can detect is 138 copies/mL. A negative result does not preclude SARS-Cov-2 infection and should not be used as the  sole basis for treatment or other patient management decisions. A negative result may occur with  improper specimen collection/handling, submission of specimen other than nasopharyngeal swab, presence of viral mutation(s) within the areas targeted by this assay, and inadequate number of viral copies(<138 copies/mL). A negative result must be combined with clinical observations, patient history, and epidemiological information. The expected result is Negative.  Fact Sheet for Patients:  EntrepreneurPulse.com.au  Fact Sheet for Healthcare Providers:  IncredibleEmployment.be  This test is no t yet approved or cleared by the [place] FDA and  has been authorized for detection and/or diagnosis of SARS-CoV-2 by FDA under an Emergency Use Authorization (EUA). This EUA will remain  in effect (meaning this test can be used) for the duration of the COVID-19 declaration under Section 564(b)(1) of the Act, 21 U.S.C.section 360bbb-3(b)(1), unless the authorization is terminated  or revoked sooner.       Influenza A by PCR NEGATIVE NEGATIVE Final   Influenza B by PCR NEGATIVE NEGATIVE Final    Comment: (NOTE) The Xpert Xpress SARS-CoV-2/FLU/RSV plus assay is intended as an aid in the diagnosis of influenza from Nasopharyngeal swab specimens and should not be used as a sole basis for treatment. Nasal washings and aspirates are unacceptable for Xpert Xpress SARS-CoV-2/FLU/RSV testing.  Fact Sheet for Patients: EntrepreneurPulse.com.au  Fact Sheet for Healthcare Providers: IncredibleEmployment.be  This test is not yet approved or cleared by the [place] FDA and has been authorized for detection  and/or diagnosis of SARS-CoV-2 by FDA under an Emergency Use Authorization (EUA). This EUA will remain in effect (meaning this test can be used) for the duration of the COVID-19 declaration under Section 564(b)(1) of the Act, 21 U.S.C. section 360bbb-3(b)(1), unless the authorization is terminated or revoked.  Performed at [facility], [place] [address], [place], [place] [address]   MRSA PCR Screening     Status: None   Collection Time: 10/12/20  2:43 AM   Specimen: Nasal Mucosa; Nasopharyngeal  Result Value Ref Range Status   MRSA by PCR NEGATIVE NEGATIVE Final    Comment:        The GeneXpert MRSA Assay (FDA approved for NASAL specimens only), is one component of a comprehensive MRSA colonization surveillance program. It is not intended to diagnose MRSA infection nor to guide or monitor treatment for MRSA infections. Performed at [facility], [place] [address] [place], [place] [address]      Time coordinating discharge: Over 30 minutes  SIGNED:   Little Ishikawa, DO [facility] 10/14/2020, 2:30 PM Pager   If 7PM-7AM, please contact night-coverage [email or web address]

## 2020-10-14 NOTE — Plan of Care (Signed)
  Problem: Education: Goal: Knowledge of General Education information will improve Description: Including pain rating scale, medication(s)/side effects and non-pharmacologic comfort measures Outcome: Adequate for Discharge   Problem: Health Behavior/Discharge Planning: Goal: Ability to manage health-related needs will improve Outcome: Adequate for Discharge   Problem: Clinical Measurements: Goal: Ability to maintain clinical measurements within normal limits will improve Outcome: Adequate for Discharge Goal: Will remain free from infection Outcome: Adequate for Discharge Goal: Diagnostic test results will improve Outcome: Adequate for Discharge Goal: Respiratory complications will improve Outcome: Adequate for Discharge Goal: Cardiovascular complication will be avoided Outcome: Adequate for Discharge   Problem: Activity: Goal: Risk for activity intolerance will decrease Outcome: Adequate for Discharge   Problem: Nutrition: Goal: Adequate nutrition will be maintained Outcome: Adequate for Discharge   Problem: Coping: Goal: Level of anxiety will decrease Outcome: Adequate for Discharge   Problem: Elimination: Goal: Will not experience complications related to bowel motility Outcome: Adequate for Discharge Goal: Will not experience complications related to urinary retention Outcome: Adequate for Discharge   Problem: Pain Managment: Goal: General experience of comfort will improve Outcome: Adequate for Discharge   Problem: Safety: Goal: Ability to remain free from injury will improve Outcome: Adequate for Discharge   Problem: Skin Integrity: Goal: Risk for impaired skin integrity will decrease Outcome: Adequate for Discharge    Patient discharging to hospice care.

## 2020-10-14 NOTE — Progress Notes (Signed)
Nutrition Brief Note  Chart reviewed. Pt now transitioning to comfort care.  No further nutrition interventions planned at this time.  Please re-consult as needed.   Doyce Stonehouse W, RD, LDN, CDCES Registered Dietitian II Certified Diabetes Care and Education Specialist Please refer to [facility] for RD and/or RD on-call/weekend/after hours pager   

## 2020-10-14 NOTE — TOC Transition Note (Signed)
Transition of Care [facility]) - CM/SW Discharge Note   Patient Details  Name: Samantha Conrad MRN: [id number] Date of Birth: 1941/05/07  Transition of Care [facility]) CM/SW Contact:  Amador Cunas, [place] Phone Number: 10/14/2020, 2:24 PM   Clinical Narrative:   Pt and pt's family have elected comfort measures and requesting dc to [facility]. Spoke to [place] with [facility] who confirmed pt's family has completed ppw and they are prepared to admit today. SW signing off at dc.   Wandra Feinstein, MSW, LCSW [phone] (coverage)       Final next level of care: [place]     Patient Goals and CMS Choice        Discharge Placement              Patient chooses bed at: Other - please specify in the comment section below: ([facility]) Patient to be transferred to facility by: [facility] Name of family member notified: Ricky Patient and family notified of of transfer: 10/14/20  Discharge Plan and Services                                     Social Determinants of Health (SDOH) Interventions     Readmission Risk Interventions No flowsheet data found.

## 2020-10-14 NOTE — Progress Notes (Signed)
Patient educated about discharge plan and verbalized an understanding to teaching. Report called to [facility] at [phone] and given to [employer].

## 2020-10-14 NOTE — Progress Notes (Signed)
[facility] Progress Note   Subjective:   Patient seen and examined at bedside.  Has made decision this AM to transfer to full comfort care and stop PD.  Breathing well currently on RA.  Denies CP, SOB, n/v/d and abdominal pain.   Objective Vitals:   10/13/20 2045 10/14/20 0443 10/14/20 0827 10/14/20 1028  BP: (!) 129/42 (!) 124/58 130/63 (!) 128/55  Pulse: 91 89 84 79  Resp:   18   Temp: 98.7 F (37.1 C) 98.2 F (36.8 C)  98.2 F (36.8 C)  TempSrc: Oral Oral  Oral  SpO2: 100% 98% 92% 99%  Weight: 78.7 kg   79.6 kg  Height:       Physical Exam General:chronically ill appearing female in NAD Heart:RRR Lungs:mostly CTAB, nml WOB on RA Abdomen:soft, NTND Extremities:1+ upper and lower extremity edema Dialysis Access: PD cath in RLQ   Filed Weights   10/13/20 0754 10/13/20 2045 10/14/20 1028  Weight: 78.9 kg 78.7 kg 79.6 kg    Intake/Output Summary (Last 24 hours) at 10/14/2020 1131 Last data filed at 10/13/2020 1200 Gross per 24 hour  Intake 120 ml  Output --  Net 120 ml    Additional Objective Labs: Basic Metabolic Panel: Recent Labs  Lab 10/11/20 2001 10/12/20 0122 10/12/20 0140 10/13/20 0155  NA 135  --  136 132*  K 3.8  --  4.0 3.9  CL 96*  --  100 98  CO2 25  --  21* 21*  GLUCOSE 104*  --  144* 340*  BUN 51*  --  52* 57*  CREATININE 6.13*  --  5.94* 6.16*  CALCIUM 8.8*  --  8.3* 8.4*  PHOS  --  3.2  --   --    Liver Function Tests: Recent Labs  Lab 10/12/20 0122  AST 22  ALT 20  ALKPHOS 112  BILITOT 0.5  PROT 5.2*  ALBUMIN 2.3*   CBC: Recent Labs  Lab 10/11/20 2001 10/12/20 0140 10/13/20 0155  WBC 7.0 7.1 5.8  NEUTROABS 5.0  --   --   HGB 9.4* 8.5* 8.4*  HCT 32.3* 28.7* 29.3*  MCV 97.9 98.3 98.7  PLT 82* 68* 74*   CBG: Recent Labs  Lab 10/13/20 1203 10/13/20 1610 10/13/20 1952 10/13/20 2347 10/14/20 0438  GLUCAP 160* 110* 113* 248* 323*   Studies/Results: DG C-Arm 1-60 Min-No Report  Result Date:  10/12/2020 Fluoroscopy was utilized by the requesting physician.  No radiographic interpretation.   DG HIP PORT UNILAT WITH PELVIS 1V LEFT  Result Date: 10/12/2020 CLINICAL DATA:  Left leg pain EXAM: DG HIP (WITH OR WITHOUT PELVIS) 1V PORT LEFT COMPARISON:  Films from earlier in the same day as well as 10/11/2020 FINDINGS: Pelvic ring is intact. Three fixation screws are noted traversing the proximal left femur. No acute bony or soft tissue abnormality is noted. IMPRESSION: Screw fixation of the proximal left femur. Electronically Signed   By: Inez Catalina M.D.   On: 10/12/2020 21:21   DG Hip Port Unilat With Pelvis 1V Left  Result Date: 10/12/2020 CLINICAL DATA:  Status post surgical internal fixation of left hip fracture. EXAM: DG HIP (WITH OR WITHOUT PELVIS) 1V PORT LEFT COMPARISON:  Fluoroscopic images of same day. FINDINGS: Status post surgical internal fixation of proximal left femoral fracture with 3 surgical screws. Good alignment of fracture components is noted. IMPRESSION: Status post surgical internal fixation of proximal left femoral neck fracture. Electronically Signed   By: Marijo Conception  M.D.   On: 10/12/2020 16:28   DG HIP OPERATIVE UNILAT WITH PELVIS LEFT  Result Date: 10/12/2020 CLINICAL DATA:  Fixation for fracture EXAM: OPERATIVE LEFT HIP   2 VIEWS TECHNIQUE: Fluoroscopic spot image(s) were submitted for interpretation post-operatively. COMPARISON:  Preoperative pelvis and left hip radiographs Oct 11, 2020 FLUOROSCOPY TIME:  76.5 seconds; 27.28 mGy; 4 acquired images FINDINGS: Frontal and lateral views obtained. There are 3 screws transfixing a fracture of the subcapital femoral neck fracture with alignment fracture site essentially anatomic. Screw tips are in the proximal femoral head. No new fracture. No dislocation. Mild narrowing left hip joint. IMPRESSION: Screws transfix a fracture of the subcapital femoral neck region with screw tips in proximal femoral head. Alignment  anatomic at fracture site. No new fracture. No dislocation. Narrowing left hip joint. Electronically Signed   By: Lowella Grip III M.D.   On: 10/12/2020 15:07    Medications: . sodium chloride 10 mL/hr at 10/12/20 1204   . acetaminophen  1,000 mg Oral Q8H  . amLODipine  5 mg Oral QHS  . carvedilol  3.125 mg Oral BID WC  . docusate sodium  100 mg Oral BID  . gentamicin cream  1 application Topical Daily  . levETIRAcetam  250 mg Oral BID WC  . mouth rinse  15 mL Mouth Rinse BID  . pantoprazole  40 mg Oral Q breakfast  . traZODone  50 mg Oral QHS    Dialysis Orders: CCPD 7x wk, 63.5kg, 5 exchanges, no day exchange, 2.25L fill volume total time 10hrs  Assessment/Plan: 1. L femoral neck fracture- s/p ORIF today by ortho 2. Volume overload- CXR with interstitial pulmonary edema. UF 2.4L last night. Slightly improved.  Failing UF - transfer to comfort care.  3. ESRD- On PD 7days a week. d/c all orders for PD including labs, has decided to stop dialysis.   4. Hypertension- Blood pressure well controlled. Continue home meds. 5. Anemiaof CKD- Hgb 8.4.  D/c all labs and mircera.  Not necessary now that not on dialysis.  6. Secondary Hyperparathyroidism -Ca and phos in goal.  7. Nutrition- regular diet 8. Dispo - going to hospice   [employer], PA-C [place] 10/14/2020,11:31 AM  LOS: 3 days

## 2020-10-14 NOTE — Progress Notes (Signed)
Patient discharge via [place].  PRN pain med given per patient request, IV flushed without difficulty.  Left in place at facility request per day shift RN.  Patient clean, dry.  Discharged with belongings via stretcher.

## 2020-10-14 NOTE — Progress Notes (Signed)
Pt refused lab works this morning.

## 2020-10-14 NOTE — Progress Notes (Signed)
    Progress Note from the Palliative Medicine Team at [facility]   Patient Name: Samantha Conrad        Date: 10/14/2020 DOB: 06-23-1940  Age: 80 y.o. MRN#: [id number] Attending Physician: Little Ishikawa, MD Primary Care Physician: Physicians, Di [facility] Family Admit Date: 10/11/2020   Medical records reviewed   80 y.o. female  with past medical history of CAD, ESRD on PD, HTN, DM2, on O2 as needed history of CVA, hypothyroidism, NASH, sleep apnea, chronic calcific pancreatitis admitted on 10/11/2020 with fall with left hip pain and left shoulder pain with radiologically confirmed minimally displaced left hip fracture. S/p hip repair   This NP visited patient at the bedside as a follow up for palliative medicine needs and emotional support.   Patient made decision to shift to comfort yesterday, foregoing all life prolonging measures including dialysis.  Family supports her decision.   I spoke again to [name] this morning.  She continues to decline.  She is weak with poor po intake.  She is requiring medication for pain.   I spoke again to her son/HPOA/ Ricky today.  He supports decsion for comfort. Education offered regarding the natural trajectory and expectations at EOL   Education offered regarding hospice benefit in a residential facility.    Plan of Care:  Comfort, quality and dignity allowing for a                                    natural death  -DNR/DNI -no artifical feeding or hydration now or in the future  - no further dialysis - minimize medications - no further diagnostics - residential hospice for EOL care- does not want to die at home ( family is requesting [place])  Prognosis is less than 2 weeks   Questions and concerns addressed     Discussed with Dr Avon Gully via secure chat   Total time spent on the unit was 35 minutes   Greater than 50% of the time was spent in counseling and coordination of care  Wadie Lessen NP  Palliative Medicine  Team Team Phone # [phone] Pager [phone]

## 2020-10-15 ENCOUNTER — Other Ambulatory Visit: Payer: Self-pay | Admitting: Cardiovascular Disease

## 2020-10-27 DEATH — deceased

## 2020-11-04 ENCOUNTER — Ambulatory Visit: Payer: Medicare Other | Admitting: Cardiovascular Disease

## 2021-01-27 ENCOUNTER — Ambulatory Visit: Payer: Medicare Other | Admitting: Cardiovascular Disease

## 2022-12-18 IMAGING — DX DG HIP (WITH OR WITHOUT PELVIS) 2-3V*L*
3 series · 3 of 3 positions shown · non-contrast
Comparison: CT 01/22/2019

CLINICAL DATA: Fall

EXAM:
DG HIP (WITH OR WITHOUT PELVIS) 2-3V LEFT

[pelvis ap]
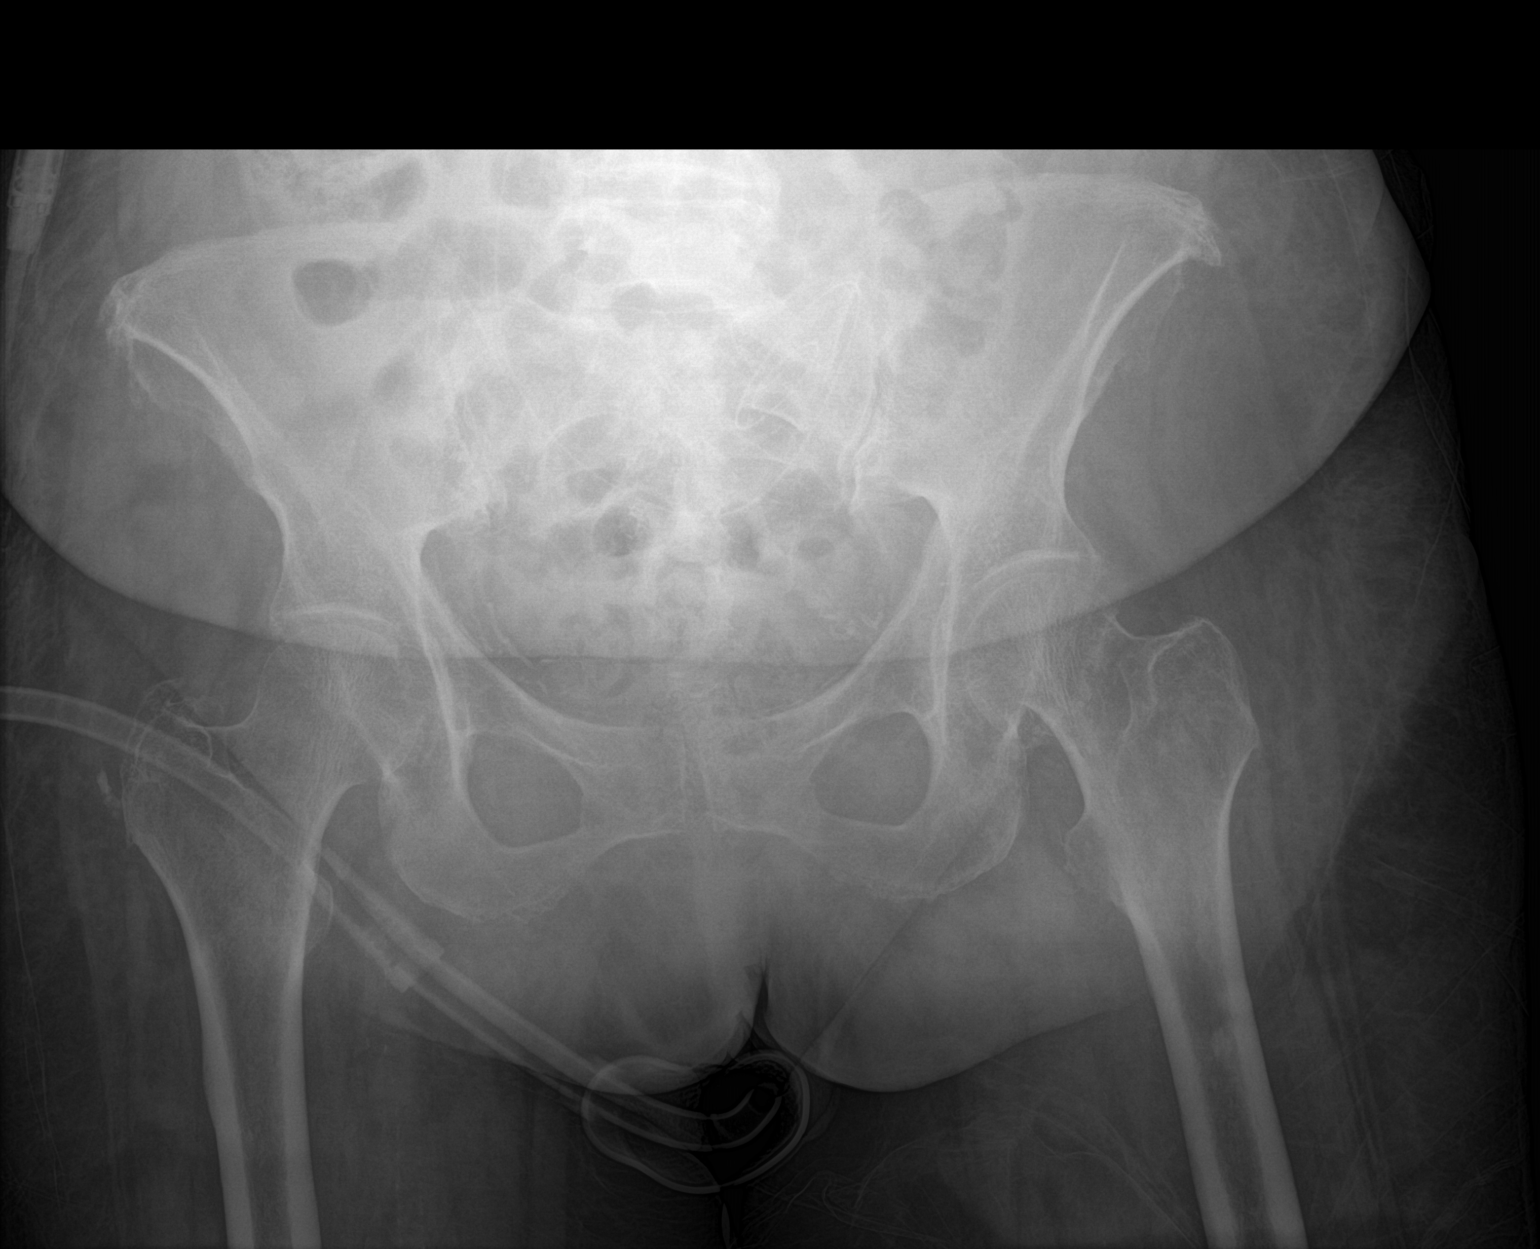

[hip ap]
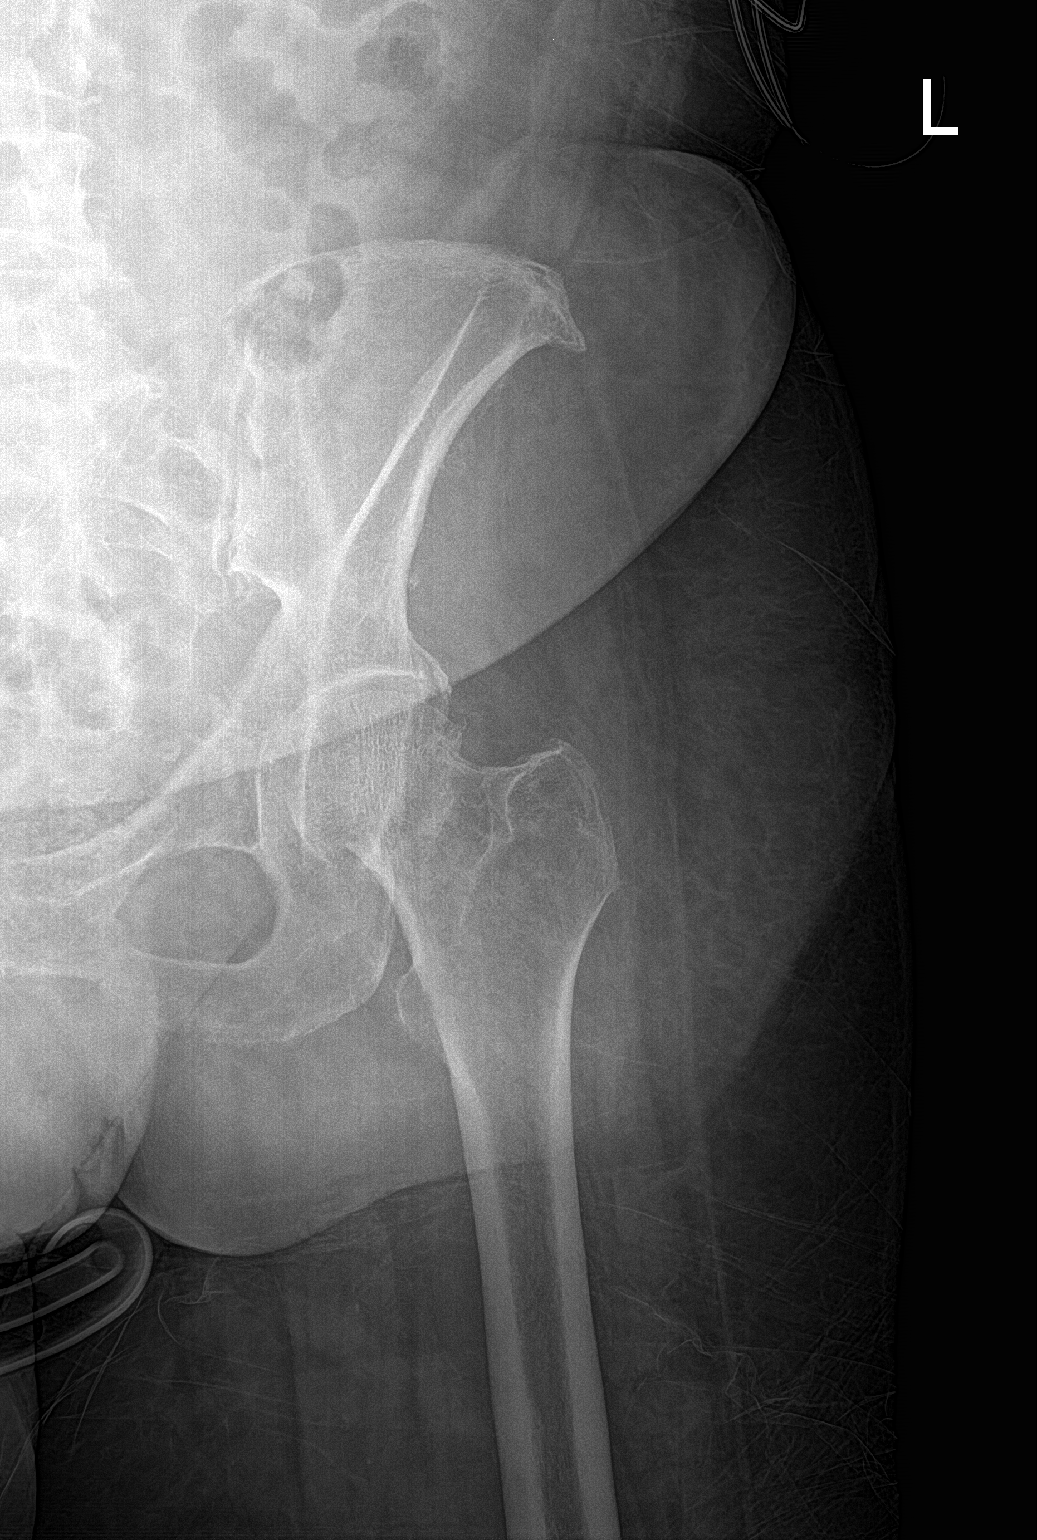

[hip lat]
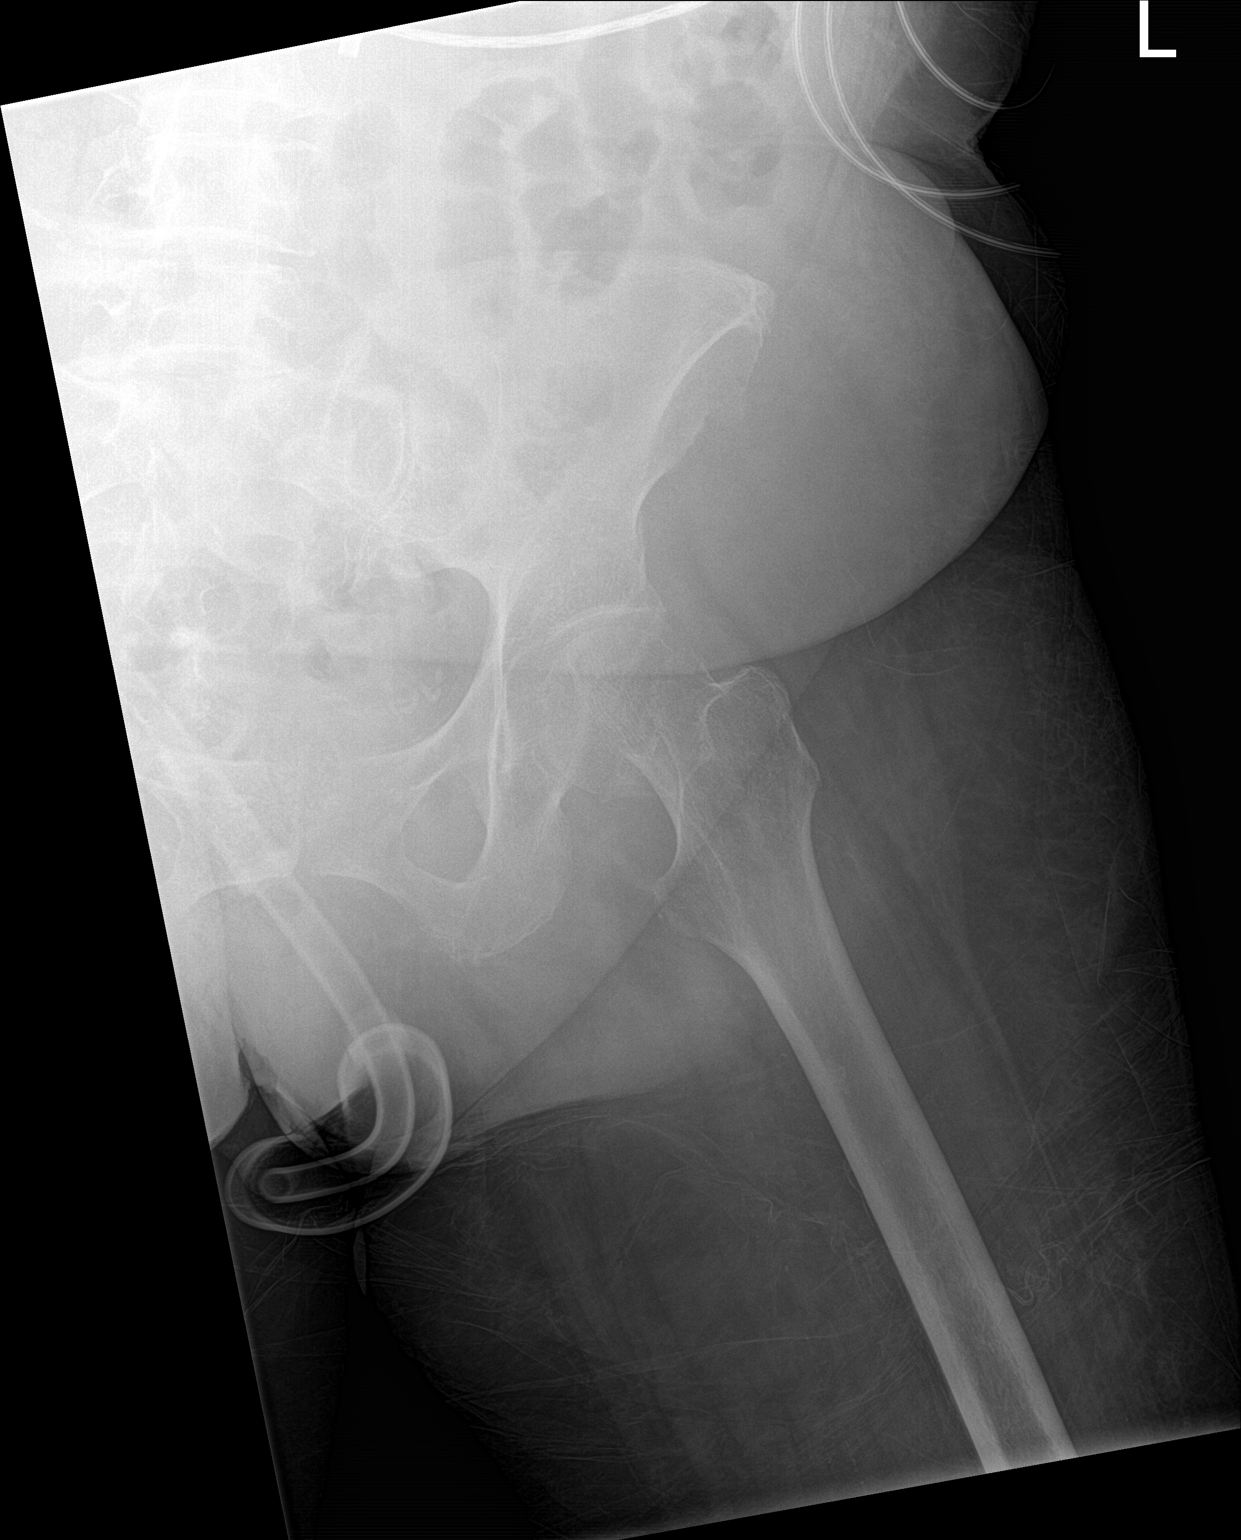

[3 of 3 positions shown; findings below may reference images not displayed]

FINDINGS: SI joints are non widened. Pubic symphysis and rami appear intact.
Acute mildly displaced subcapital left femoral neck fracture. No
femoral head dislocation
IMPRESSION: Acute minimally displaced left femoral neck fracture

## 2022-12-19 IMAGING — DX DG HIP (WITH OR WITHOUT PELVIS) 1V PORT*L*
2 series · 2 of 2 positions shown · non-contrast
Comparison: Fluoroscopic images of same day.

CLINICAL DATA: Status post surgical internal fixation of left hip
fracture.

EXAM:
DG HIP (WITH OR WITHOUT PELVIS) 1V PORT LEFT

[pelvis]
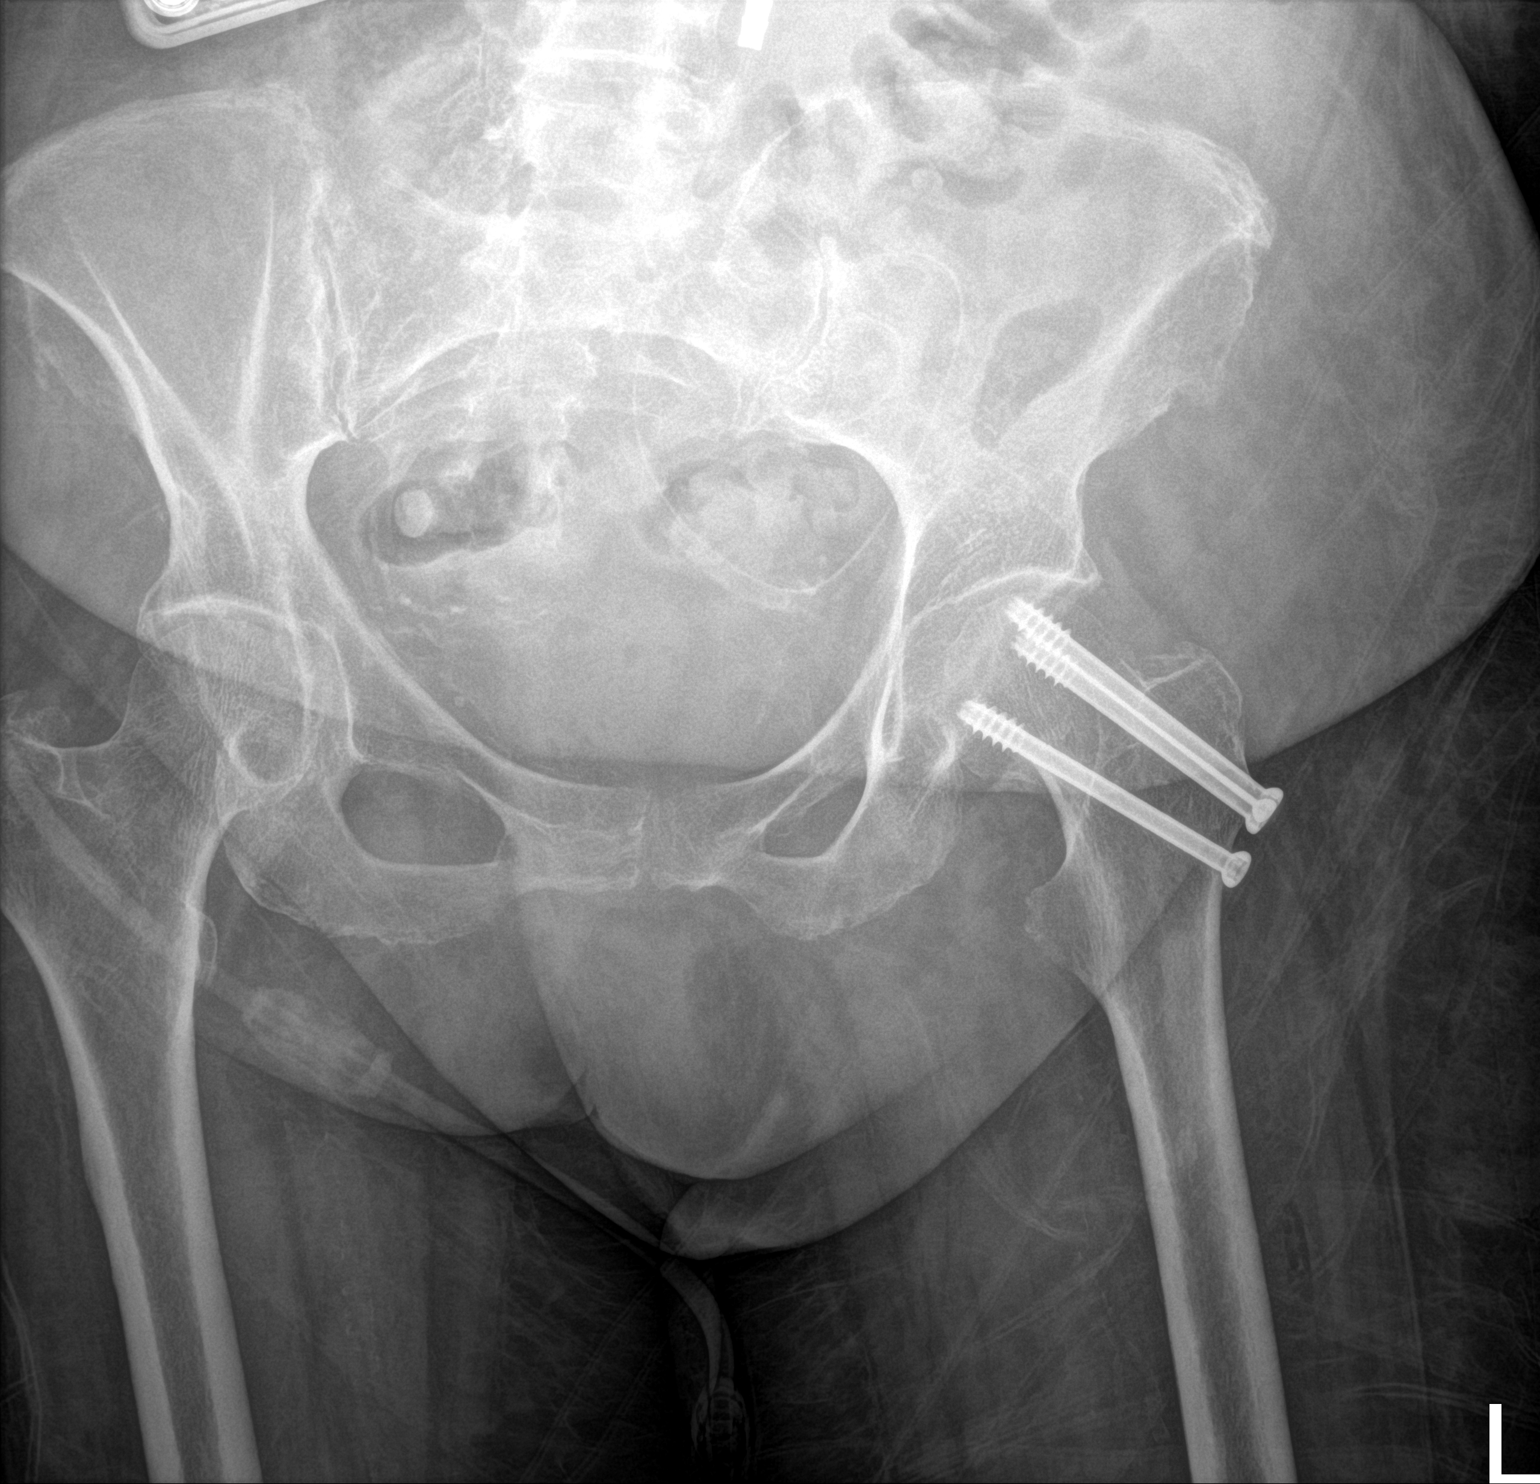

[hip]
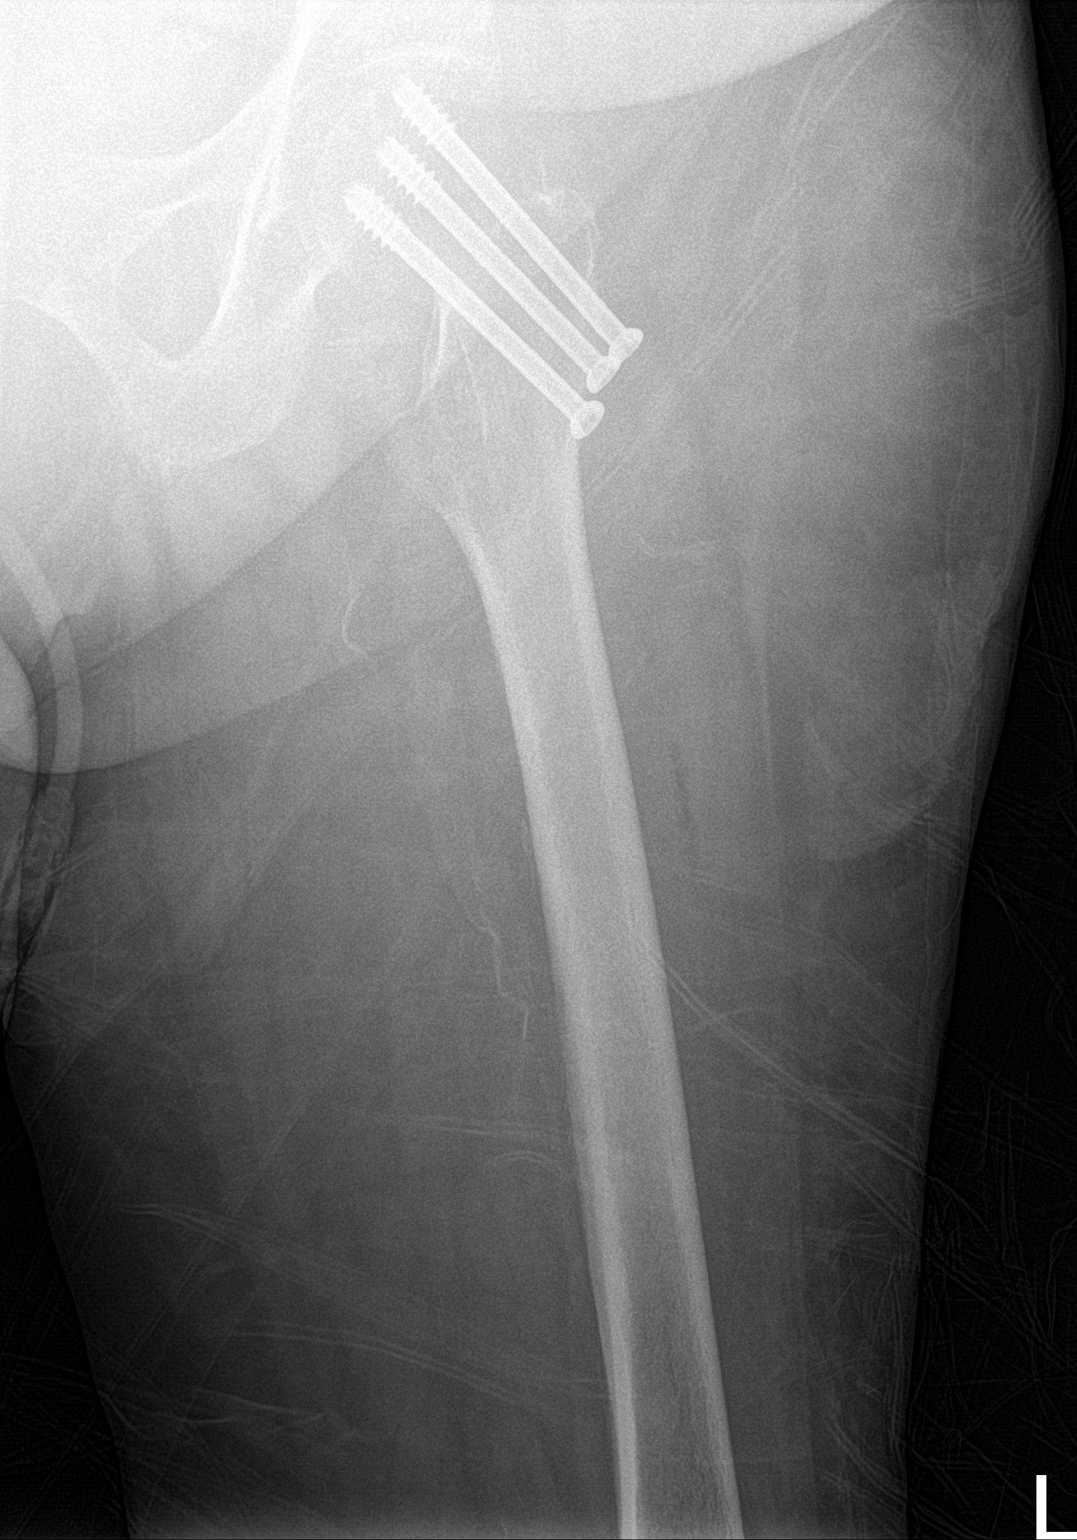

[2 of 2 positions shown; findings below may reference images not displayed]

FINDINGS: Status post surgical internal fixation of proximal left femoral
fracture with 3 surgical screws. Good alignment of fracture
components is noted.
IMPRESSION: Status post surgical internal fixation of proximal left femoral neck
fracture.

## 2022-12-19 IMAGING — RF DG HIP (WITH PELVIS) OPERATIVE*L*
1 series · 4 of 4 positions shown · non-contrast
Comparison: Preoperative pelvis and left hip radiographs October 11, 2020

FLUOROSCOPY TIME:  76.5 seconds; 27.28 mGy; 4 acquired images

CLINICAL DATA: Fixation for fracture

EXAM:
OPERATIVE LEFT HIP   2 VIEWS
TECHNIQUE: Fluoroscopic spot image(s) were submitted for interpretation
post-operatively.

[Series 1: run · 4 of 4 slices shown]
[im 1/4]
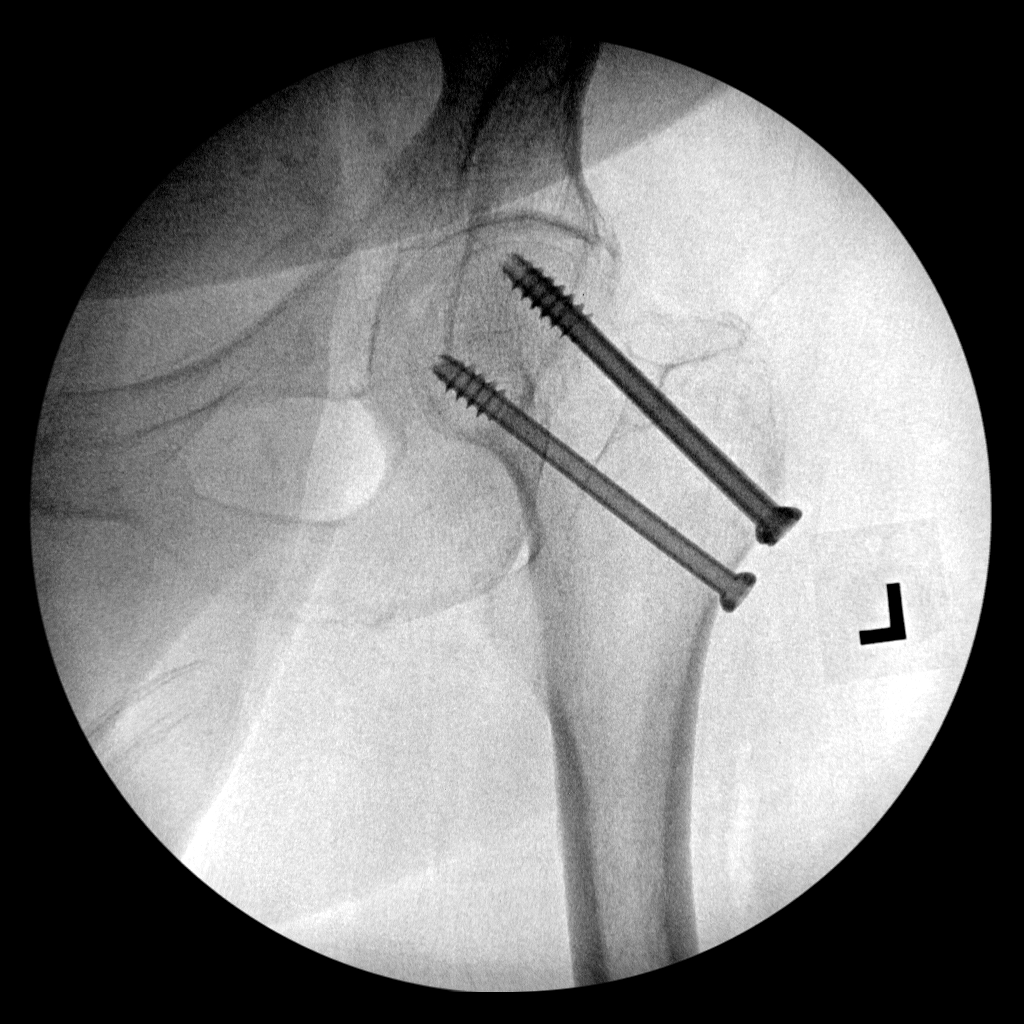
[im 2/4]
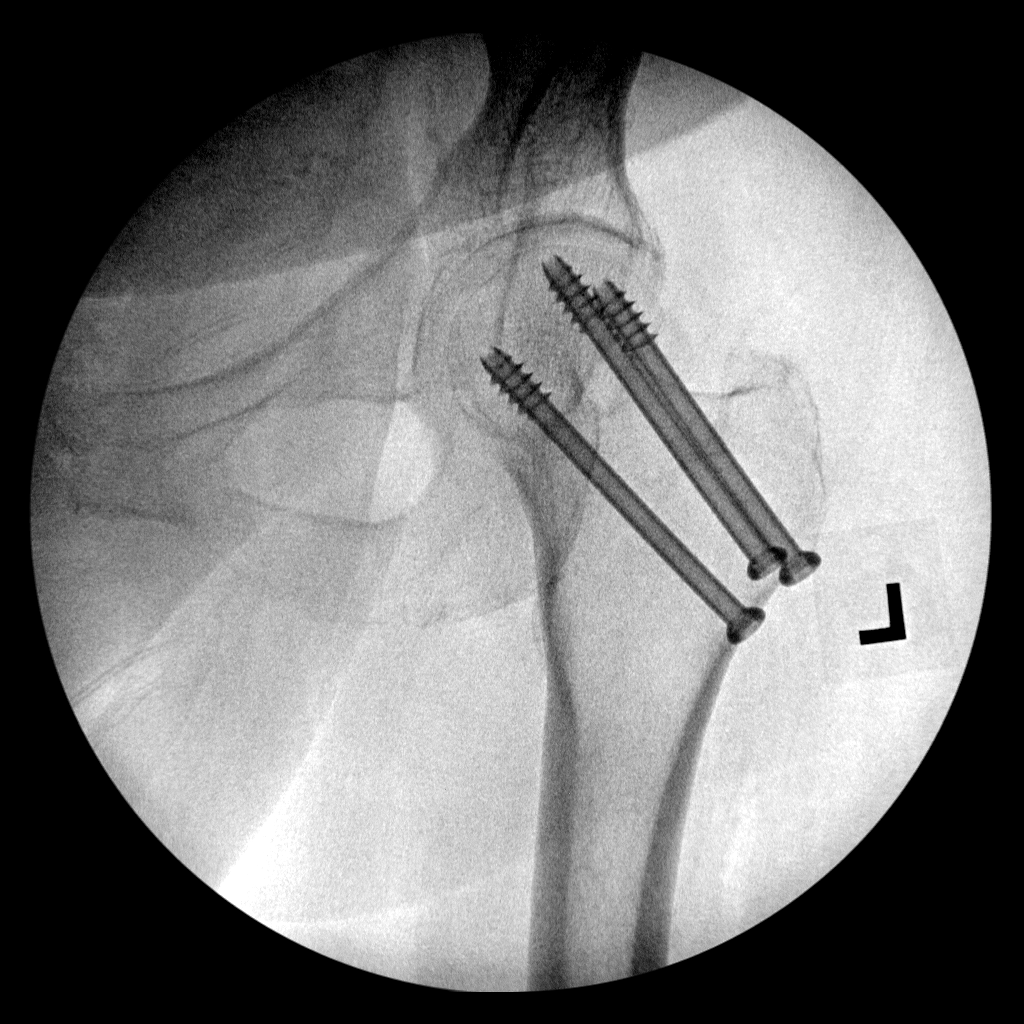
[im 3/4]
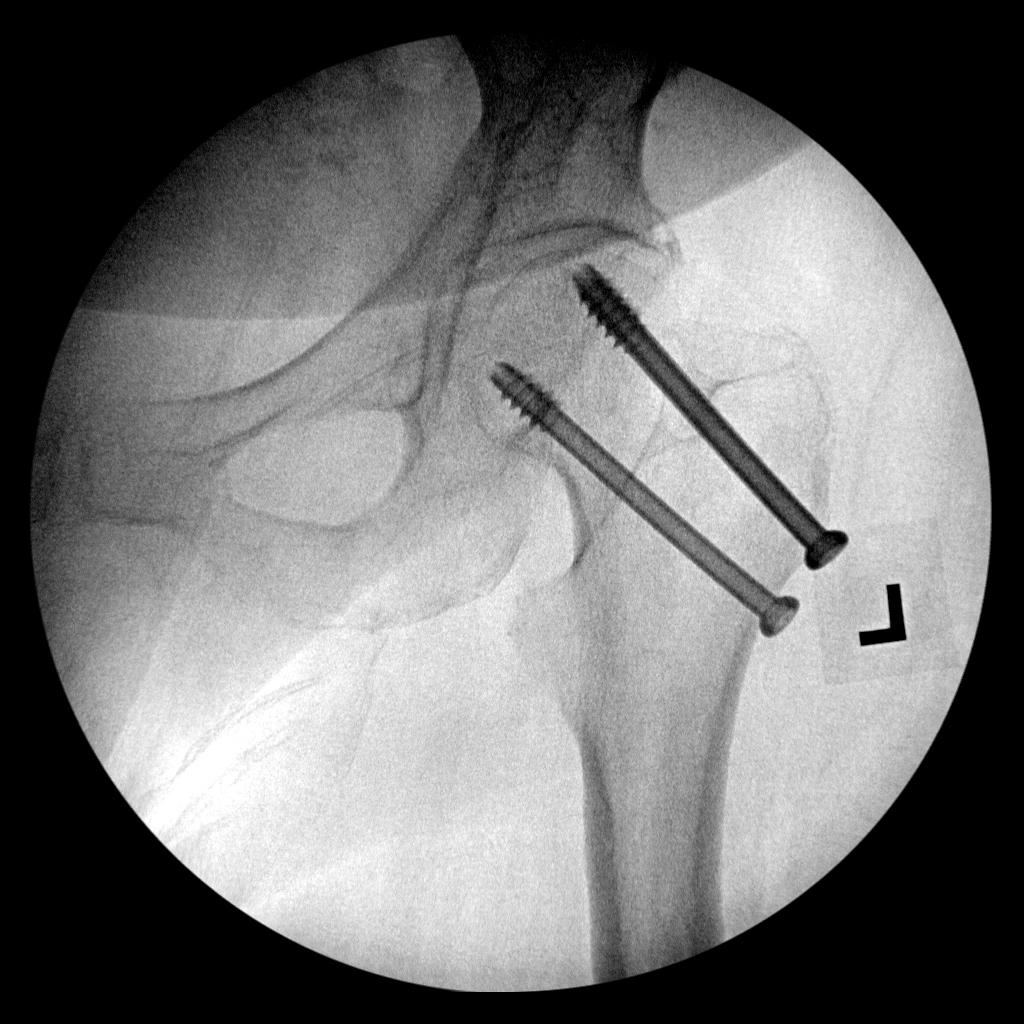
[im 4/4]
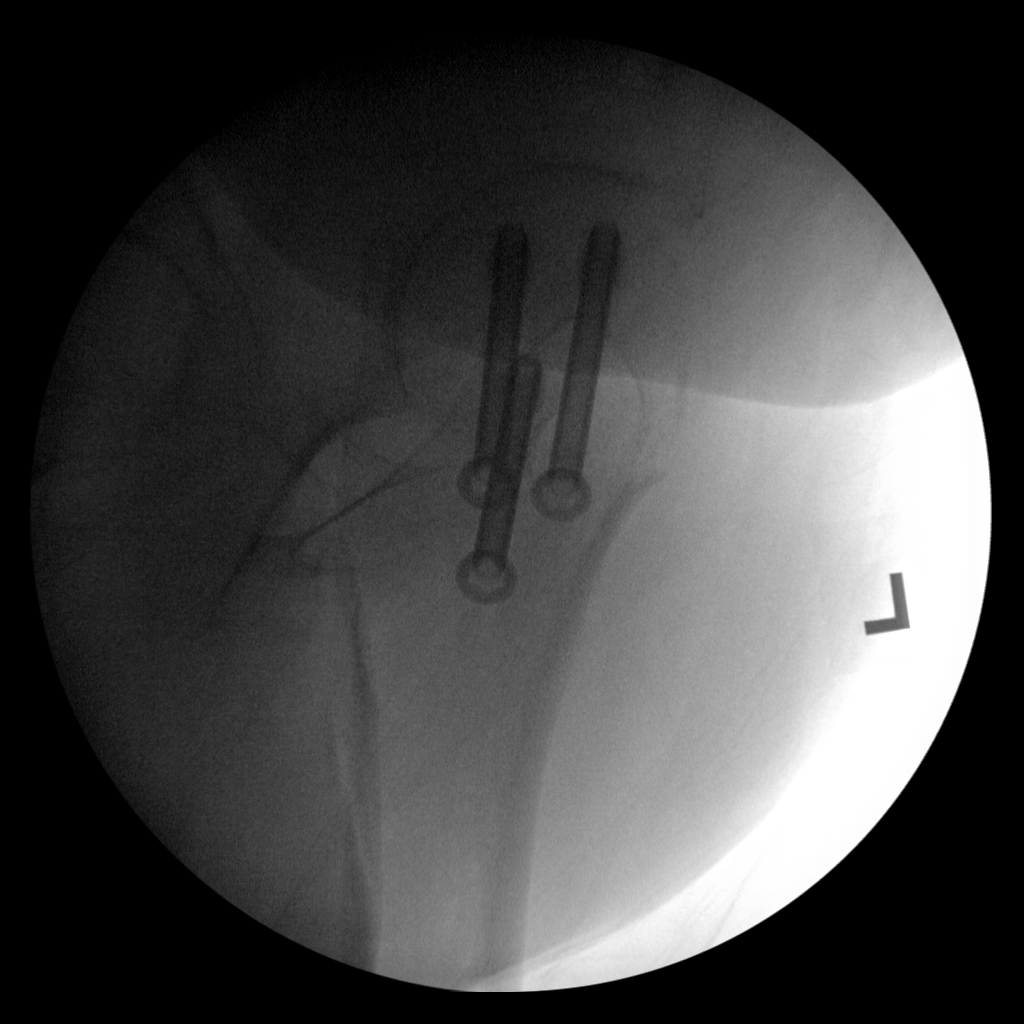

[4 of 4 positions shown; findings below may reference images not displayed]

FINDINGS: Frontal and lateral views obtained. There are 3 screws transfixing a
fracture of the subcapital femoral neck fracture with alignment
fracture site essentially anatomic. Screw tips are in the proximal
femoral head. No new fracture. No dislocation. Mild narrowing left
hip joint.
IMPRESSION: Screws transfix a fracture of the subcapital femoral neck region
with screw tips in proximal femoral head. Alignment anatomic at
fracture site. No new fracture. No dislocation. Narrowing left hip
joint.
